# Patient Record
Sex: Male | Born: 1935 | Race: White | Hispanic: No | Marital: Married | Smoking: Former smoker
Health system: Southern US, Community
[De-identification: ages and names within clinical notes are randomized; demographics above are authoritative.]

## PROBLEM LIST (undated history)

## (undated) DIAGNOSIS — K5790 Diverticulosis of intestine, part unspecified, without perforation or abscess without bleeding: Secondary | ICD-10-CM

## (undated) DIAGNOSIS — K219 Gastro-esophageal reflux disease without esophagitis: Secondary | ICD-10-CM

## (undated) DIAGNOSIS — N400 Enlarged prostate without lower urinary tract symptoms: Secondary | ICD-10-CM

## (undated) DIAGNOSIS — E785 Hyperlipidemia, unspecified: Secondary | ICD-10-CM

## (undated) DIAGNOSIS — N183 Chronic kidney disease, stage 3 unspecified: Secondary | ICD-10-CM

## (undated) DIAGNOSIS — I10 Essential (primary) hypertension: Secondary | ICD-10-CM

## (undated) DIAGNOSIS — F32A Depression, unspecified: Secondary | ICD-10-CM

## (undated) DIAGNOSIS — F329 Major depressive disorder, single episode, unspecified: Secondary | ICD-10-CM

## (undated) DIAGNOSIS — M1990 Unspecified osteoarthritis, unspecified site: Secondary | ICD-10-CM

## (undated) HISTORY — DX: Hyperlipidemia, unspecified: E78.5

## (undated) HISTORY — PX: VEIN LIGATION AND STRIPPING: SHX2653

## (undated) HISTORY — DX: Diverticulosis of intestine, part unspecified, without perforation or abscess without bleeding: K57.90

## (undated) HISTORY — PX: FRACTURE SURGERY: SHX138

## (undated) HISTORY — DX: Benign prostatic hyperplasia without lower urinary tract symptoms: N40.0

## (undated) HISTORY — DX: Essential (primary) hypertension: I10

## (undated) HISTORY — PX: COLONOSCOPY: SHX174

## (undated) HISTORY — PX: KNEE ARTHROSCOPY: SUR90

## (undated) HISTORY — PX: TONSILLECTOMY AND ADENOIDECTOMY: SUR1326

---

## 1999-12-04 ENCOUNTER — Ambulatory Visit (HOSPITAL_COMMUNITY): Admission: RE | Admit: 1999-12-04 | Discharge: 1999-12-04 | Payer: Self-pay | Admitting: Internal Medicine

## 1999-12-04 ENCOUNTER — Encounter: Payer: Self-pay | Admitting: Internal Medicine

## 2000-11-01 ENCOUNTER — Inpatient Hospital Stay (HOSPITAL_COMMUNITY): Admission: EM | Admit: 2000-11-01 | Discharge: 2000-11-05 | Payer: Self-pay | Admitting: Emergency Medicine

## 2000-11-01 ENCOUNTER — Encounter: Payer: Self-pay | Admitting: Emergency Medicine

## 2004-03-08 ENCOUNTER — Ambulatory Visit (HOSPITAL_COMMUNITY): Admission: RE | Admit: 2004-03-08 | Discharge: 2004-03-08 | Payer: Self-pay | Admitting: Internal Medicine

## 2004-04-09 DIAGNOSIS — Z86010 Personal history of colonic polyps: Secondary | ICD-10-CM | POA: Insufficient documentation

## 2011-07-29 ENCOUNTER — Encounter: Payer: Self-pay | Admitting: Internal Medicine

## 2011-08-07 ENCOUNTER — Ambulatory Visit (AMBULATORY_SURGERY_CENTER): Payer: Medicare Other | Admitting: *Deleted

## 2011-08-07 VITALS — Ht 70.0 in | Wt 245.0 lb

## 2011-08-07 DIAGNOSIS — Z1211 Encounter for screening for malignant neoplasm of colon: Secondary | ICD-10-CM

## 2011-08-07 MED ORDER — PEG-KCL-NACL-NASULF-NA ASC-C 100 G PO SOLR: ORAL | Status: DC

## 2011-08-21 ENCOUNTER — Encounter: Payer: Self-pay | Admitting: Internal Medicine

## 2011-08-21 ENCOUNTER — Ambulatory Visit (AMBULATORY_SURGERY_CENTER): Payer: Medicare Other | Admitting: Internal Medicine

## 2011-08-21 VITALS — BP 146/66 | HR 61 | Temp 96.5°F | Resp 20 | Ht 70.0 in | Wt 245.0 lb

## 2011-08-21 DIAGNOSIS — D126 Benign neoplasm of colon, unspecified: Secondary | ICD-10-CM

## 2011-08-21 DIAGNOSIS — Z86010 Personal history of colonic polyps: Secondary | ICD-10-CM

## 2011-08-21 DIAGNOSIS — Z1211 Encounter for screening for malignant neoplasm of colon: Secondary | ICD-10-CM

## 2011-08-21 LAB — HM COLONOSCOPY

## 2011-08-21 MED ORDER — ASPIRIN 81 MG PO TABS: 81.0000 mg | ORAL_TABLET | Freq: Every day | ORAL | Status: DC

## 2011-08-21 MED ORDER — SODIUM CHLORIDE 0.9 % IV SOLN: 500.0000 mL | INTRAVENOUS | Status: DC

## 2011-08-21 NOTE — Progress Notes (Signed)
Small, reddened area below left side of lip noted.  Pt states he has had this area for a while and it is healing.

## 2011-08-21 NOTE — Progress Notes (Signed)
Patient did not experience any of the following events: a burn prior to discharge; a fall within the facility; wrong site/side/patient/procedure/implant event; or a hospital transfer or hospital admission upon discharge from the facility. (G8907) Patient did not have preoperative order for IV antibiotic SSI prophylaxis. (G8918)  

## 2011-08-21 NOTE — Patient Instructions (Addendum)
Four polyps were removed today. They look benign. I will send a letter about the results. It may make sense to have a repeat colonoscopy in the future but it will depend upon your health. I suspect I will recommend you at least think about it in 3-5 years.  You also have diverticulosis. That is not usually a problem. Please read the information provided.  Gatha Mayer, MD, [facility]   FOLLOW DISCHARGE INSTRUCTIONS ([place]).  Hold aspirin & aspirin products & any anti-inflammatory medications for 2 weeks.

## 2011-08-22 ENCOUNTER — Telehealth: Payer: Self-pay

## 2011-08-22 NOTE — Telephone Encounter (Signed)

## 2011-08-26 ENCOUNTER — Encounter: Payer: Self-pay | Admitting: Internal Medicine

## 2011-08-26 NOTE — Progress Notes (Signed)
Quick Note:  Recall colonoscopy 07/2014 to consider repeat colonoscopy ______

## 2013-07-27 ENCOUNTER — Encounter: Payer: Self-pay | Admitting: Emergency Medicine

## 2013-07-27 ENCOUNTER — Ambulatory Visit: Payer: Medicare Other | Admitting: Emergency Medicine

## 2013-07-27 VITALS — BP 122/72 | HR 72 | Temp 97.8°F | Resp 18 | Wt 234.0 lb

## 2013-07-27 DIAGNOSIS — I10 Essential (primary) hypertension: Secondary | ICD-10-CM

## 2013-07-27 DIAGNOSIS — E119 Type 2 diabetes mellitus without complications: Secondary | ICD-10-CM

## 2013-07-27 DIAGNOSIS — E785 Hyperlipidemia, unspecified: Secondary | ICD-10-CM

## 2013-07-27 DIAGNOSIS — J209 Acute bronchitis, unspecified: Secondary | ICD-10-CM

## 2013-07-27 DIAGNOSIS — J309 Allergic rhinitis, unspecified: Secondary | ICD-10-CM

## 2013-07-27 MED ORDER — ALBUTEROL SULFATE HFA 108 (90 BASE) MCG/ACT IN AERS: 2.0000 | INHALATION_SPRAY | Freq: Four times a day (QID) | RESPIRATORY_TRACT | Status: DC | PRN

## 2013-07-27 MED ORDER — BENZONATATE 100 MG PO CAPS: 100.0000 mg | ORAL_CAPSULE | Freq: Three times a day (TID) | ORAL | Status: DC | PRN

## 2013-07-27 MED ORDER — AZITHROMYCIN 250 MG PO TABS: ORAL_TABLET | ORAL | Status: AC

## 2013-07-27 NOTE — Patient Instructions (Signed)
Allergic Rhinitis Allergic rhinitis is when the mucous membranes in the nose respond to allergens. Allergens are particles in the air that cause your body to have an allergic reaction. This causes you to release allergic antibodies. Through a chain of events, these eventually cause you to release histamine into the blood stream (hence the use of antihistamines). Although meant to be protective to the body, it is this release that causes your discomfort, such as frequent sneezing, congestion and an itchy runny nose.  CAUSES  The pollen allergens may come from grasses, trees, and weeds. This is seasonal allergic rhinitis, or "hay fever." Other allergens cause year-round allergic rhinitis (perennial allergic rhinitis) such as house dust mite allergen, pet dander and mold spores.  SYMPTOMS   Nasal stuffiness (congestion).  Runny, itchy nose with sneezing and tearing of the eyes.  There is often an itching of the mouth, eyes and ears. It cannot be cured, but it can be controlled with medications. DIAGNOSIS  If you are unable to determine the offending allergen, skin or blood testing may find it. TREATMENT   Avoid the allergen.  Medications and allergy shots (immunotherapy) can help.  Hay fever may often be treated with antihistamines in pill or nasal spray forms. Antihistamines block the effects of histamine. There are over-the-counter medicines that may help with nasal congestion and swelling around the eyes. Check with your caregiver before taking or giving this medicine. If the treatment above does not work, there are many new medications your caregiver can prescribe. Stronger medications may be used if initial measures are ineffective. Desensitizing injections can be used if medications and avoidance fails. Desensitization is when a patient is given ongoing shots until the body becomes less sensitive to the allergen. Make sure you follow up with your caregiver if problems continue. SEEK MEDICAL  CARE IF:   You develop fever (more than 100.5 F (38.1 C).  You develop a cough that does not stop easily (persistent).  You have shortness of breath.  You start wheezing.  Symptoms interfere with normal daily activities. Document Released: 06/03/2001 Document Revised: 12/01/2011 Document Reviewed: 12/13/2008 [facility] Patient Information 2014 [place]. Bronchitis Bronchitis is a problem of the air tubes leading to your lungs. This problem makes it hard for air to get in and out of the lungs. You may cough a lot because your air tubes are narrow. Going without care can cause lasting (chronic) bronchitis. HOME CARE   Drink enough fluids to keep your pee (urine) clear or pale yellow.  Use a cool mist humidifier.  Quit smoking if you smoke. If you keep smoking, the bronchitis might not get better.  Only take medicine as told by your doctor. GET HELP RIGHT AWAY IF:   Coughing keeps you awake.  You start to wheeze.  You become more sick or weak.  You have a hard time breathing or get short of breath.  You cough up blood.  Coughing lasts more than 2 weeks.  You have a fever.  Your baby is older than 3 months with a rectal temperature of 102 F (38.9 C) or higher.  Your baby is 43 months old or younger with a rectal temperature of 100.4 F (38 C) or higher. MAKE SURE YOU:  Understand these instructions.  Will watch your condition.  Will get help right away if you are not doing well or get worse. Document Released: 02/25/2008 Document Revised: 12/01/2011 Document Reviewed: 05/03/2013 [facility] Patient Information 2014 [place], [place].

## 2013-07-28 ENCOUNTER — Encounter: Payer: Self-pay | Admitting: Emergency Medicine

## 2013-07-28 DIAGNOSIS — E1122 Type 2 diabetes mellitus with diabetic chronic kidney disease: Secondary | ICD-10-CM | POA: Insufficient documentation

## 2013-07-28 DIAGNOSIS — I10 Essential (primary) hypertension: Secondary | ICD-10-CM | POA: Insufficient documentation

## 2013-07-28 DIAGNOSIS — E1165 Type 2 diabetes mellitus with hyperglycemia: Secondary | ICD-10-CM | POA: Insufficient documentation

## 2013-07-28 NOTE — Progress Notes (Signed)
Subjective:    Patient ID: Edwin Osborne, male    DOB: [birth year], 78 y.o.   MRN: [id number]  HPI Comments: 78 yo male presents with fever, cough, fatigue, post nasal drainage x 2 days and now with thick production. Patient notes allergies with weather change but worse after racking leaves. No relief with OTC Tylenol. Denies any other SX.  Cough Associated symptoms include a fever and postnasal drip.  Sore Throat  Associated symptoms include congestion and coughing.     Current Outpatient Prescriptions on File Prior to Visit  Medication Sig Dispense Refill  . aspirin 81 MG tablet Take 1 tablet (81 mg total) by mouth daily. Stop and restart on 09/04/11      . doxazosin (CARDURA) 8 MG tablet Take 4 mg by mouth Daily.      . fenofibrate micronized (LOFIBRA) 134 MG capsule Take 1 mg by mouth Daily.      Marland Kitchen JANUVIA 100 MG tablet Take 50 mg by mouth Daily.      . metFORMIN (GLUCOPHAGE-XR) 500 MG 24 hr tablet Take 500 mg by mouth 3 (three) times daily with meals. Takes 1 in am and at lunch and 2 at hs      . Multiple Vitamins-Minerals (MULTIVITAMIN WITH MINERALS) tablet Take 1 tablet by mouth daily.        . Nutritional Supplements (VITAMIN D MAINTENANCE PO) Take 10,000 mg by mouth daily.        . pravastatin (PRAVACHOL) 40 MG tablet Take 20 mg by mouth daily.        . quinapril-hydrochlorothiazide (ACCURETIC) 20-25 MG per tablet Take 0.5 tablets by mouth daily.        Marland Kitchen Specialty Vitamins Products (MAGNESIUM, AMINO ACID CHELATE,) 133 MG tablet Take 1 tablet by mouth daily.         Current Facility-Administered Medications on File Prior to Visit  Medication Dose Route Frequency Provider Last Rate Last Dose  . 0.9 %  sodium chloride infusion  500 mL Intravenous Continuous Gatha Mayer, MD       Review of patient's allergies indicates no known allergies. Past Medical History  Diagnosis Date  . Hyperlipidemia   . Hypertension   . Diabetes mellitus   . BPH (benign prostatic hyperplasia)    . Cataract     Review of Systems  Constitutional: Positive for fever and fatigue.  HENT: Positive for congestion, postnasal drip and sinus pressure.   Eyes: Negative.   Respiratory: Positive for cough.   Cardiovascular: Negative.   Gastrointestinal: Negative.   Musculoskeletal: Negative.   Allergic/Immunologic: Negative.   Psychiatric/Behavioral: Negative.       BP 122/72  Pulse 72  Temp(Src) 97.8 F (36.6 C) (Temporal)  Resp 18  Wt 234 lb (106.142 kg)  Objective:   Physical Exam  Nursing note and vitals reviewed. Constitutional: He appears well-developed and well-nourished.  HENT:  Head: Normocephalic and atraumatic.  Right Ear: External ear normal.  Left Ear: External ear normal.  Nose: Nose normal.  Cloudy TM's bilaterally  Eyes: Pupils are equal, round, and reactive to light.  Neck: Normal range of motion. Neck supple.  Cardiovascular: Normal rate, regular rhythm and normal heart sounds.   Pulmonary/Chest: Effort normal.  Occasional Wheeze, clears with hard cough. + congested cough.  Abdominal: Soft.  Musculoskeletal: Normal range of motion.  Neurological: He is alert.  Skin: Skin is warm and dry.  Psychiatric: He has a normal mood and affect. Judgment normal.  Assessment & Plan:  Bronchitis, allergic rhinitis with history of pneumonia last year concern with possibility of early decompensation. Zpak #1 AD, Tessalon Perles 100 mg 1-2 PO TID/PRN, Allegra OTC AD, Albuterol HFA 1-2 Puffs Q4hr/PRN, increase H2o. PT will call if SX do not improve.

## 2013-08-08 ENCOUNTER — Other Ambulatory Visit: Payer: Self-pay | Admitting: Internal Medicine

## 2013-08-12 ENCOUNTER — Encounter: Payer: Self-pay | Admitting: Internal Medicine

## 2013-08-15 ENCOUNTER — Ambulatory Visit: Payer: Medicare Other | Admitting: Internal Medicine

## 2013-08-15 ENCOUNTER — Encounter: Payer: Self-pay | Admitting: Internal Medicine

## 2013-08-15 VITALS — BP 134/70 | HR 80 | Temp 98.4°F | Resp 18 | Ht 70.5 in | Wt 238.4 lb

## 2013-08-15 DIAGNOSIS — E782 Mixed hyperlipidemia: Secondary | ICD-10-CM

## 2013-08-15 DIAGNOSIS — E1169 Type 2 diabetes mellitus with other specified complication: Secondary | ICD-10-CM | POA: Insufficient documentation

## 2013-08-15 DIAGNOSIS — Z1212 Encounter for screening for malignant neoplasm of rectum: Secondary | ICD-10-CM

## 2013-08-15 DIAGNOSIS — Z0000 Encounter for general adult medical examination without abnormal findings: Secondary | ICD-10-CM

## 2013-08-15 DIAGNOSIS — E119 Type 2 diabetes mellitus without complications: Secondary | ICD-10-CM

## 2013-08-15 DIAGNOSIS — Z125 Encounter for screening for malignant neoplasm of prostate: Secondary | ICD-10-CM

## 2013-08-15 DIAGNOSIS — E559 Vitamin D deficiency, unspecified: Secondary | ICD-10-CM

## 2013-08-15 DIAGNOSIS — Z79899 Other long term (current) drug therapy: Secondary | ICD-10-CM

## 2013-08-15 DIAGNOSIS — I10 Essential (primary) hypertension: Secondary | ICD-10-CM

## 2013-08-15 LAB — BASIC METABOLIC PANEL WITH GFR
BUN: 23 mg/dL (ref 6–23)
CO2: 27 mEq/L (ref 19–32)
Calcium: 9.8 mg/dL (ref 8.4–10.5)
Chloride: 102 mEq/L (ref 96–112)
Creat: 1.58 mg/dL — ABNORMAL HIGH (ref 0.50–1.35)
GFR, Est African American: 48 mL/min — ABNORMAL LOW
GFR, Est Non African American: 42 mL/min — ABNORMAL LOW
Glucose, Bld: 155 mg/dL — ABNORMAL HIGH (ref 70–99)
Potassium: 4.5 mEq/L (ref 3.5–5.3)
Sodium: 139 mEq/L (ref 135–145)

## 2013-08-15 LAB — LIPID PANEL
Cholesterol: 139 mg/dL (ref 0–200)
HDL: 39 mg/dL — ABNORMAL LOW (ref >39)
LDL Cholesterol: 53 mg/dL (ref 0–99)
Total CHOL/HDL Ratio: 3.6 Ratio
Triglycerides: 236 mg/dL — ABNORMAL HIGH (ref <150)
VLDL: 47 mg/dL — ABNORMAL HIGH (ref 0–40)

## 2013-08-15 LAB — TSH: TSH: 2.381 u[IU]/mL (ref 0.350–4.500)

## 2013-08-15 LAB — MAGNESIUM: Magnesium: 1.9 mg/dL (ref 1.5–2.5)

## 2013-08-15 LAB — CBC WITH DIFFERENTIAL/PLATELET
Basophils Absolute: 0 10*3/uL (ref 0.0–0.1)
Basophils Relative: 0 % (ref 0–1)
Eosinophils Absolute: 0.6 10*3/uL (ref 0.0–0.7)
Eosinophils Relative: 9 % — ABNORMAL HIGH (ref 0–5)
HCT: 39.8 % (ref 39.0–52.0)
Hemoglobin: 13.8 g/dL (ref 13.0–17.0)
Lymphocytes Relative: 25 % (ref 12–46)
Lymphs Abs: 1.7 10*3/uL (ref 0.7–4.0)
MCH: 29.4 pg (ref 26.0–34.0)
MCHC: 34.7 g/dL (ref 30.0–36.0)
MCV: 84.7 fL (ref 78.0–100.0)
Monocytes Absolute: 0.6 10*3/uL (ref 0.1–1.0)
Monocytes Relative: 9 % (ref 3–12)
Neutro Abs: 3.8 10*3/uL (ref 1.7–7.7)
Neutrophils Relative %: 57 % (ref 43–77)
Platelets: 202 10*3/uL (ref 150–400)
RBC: 4.7 MIL/uL (ref 4.22–5.81)
RDW: 14.1 % (ref 11.5–15.5)
WBC: 6.7 10*3/uL (ref 4.0–10.5)

## 2013-08-15 LAB — HEMOGLOBIN A1C
Hgb A1c MFr Bld: 8 % — ABNORMAL HIGH (ref <5.7)
Mean Plasma Glucose: 183 mg/dL — ABNORMAL HIGH (ref <117)

## 2013-08-15 LAB — HEPATIC FUNCTION PANEL
ALT: 17 U/L (ref 0–53)
AST: 17 U/L (ref 0–37)
Albumin: 4.3 g/dL (ref 3.5–5.2)
Alkaline Phosphatase: 40 U/L (ref 39–117)
Bilirubin, Direct: 0.1 mg/dL (ref 0.0–0.3)
Indirect Bilirubin: 0.4 mg/dL (ref 0.0–0.9)
Total Bilirubin: 0.5 mg/dL (ref 0.3–1.2)
Total Protein: 6.6 g/dL (ref 6.0–8.3)

## 2013-08-15 NOTE — Patient Instructions (Signed)

## 2013-08-15 NOTE — Progress Notes (Signed)
Patient ID: Edwin Osborne, male   DOB: 1935-08-06, 78 y.o.   MRN: [id number]  Annual Screening Comprehensive Examination  This very nice 78 yo MWM presents for complete physical.  Patient has been followed for HTN, Diabetes, Hyperlipidemia, and vitamin D Deficiency.   Patient's BP has been controlled at home. Patient denies any cardiac Symptoms as chest pain, palpitations, shortness of breath, dizziness or ankle swelling.   Patient's hyperlipidemia is controlled with diet and medications. Patient denies myalgias or other medication SE's. Last cholesterol last visit was 148  ,HDL 37 were at goal but he had a very elevated LDL 136 and Triglycerides 376 due to his admitted overeating and poor compliance.   Patient has Diabetes with last A1c 10.5% in August. Patient self d/c'd his Januvia because of it's cost. . Patient denies reactive hypoglycemic symptoms, visual blurring, diabetic polys, or paresthesias.He is aware is is overdue with his f/u with Dr Einar Gip and was advised to follow-up.   Finally, patient has history of Vitamin D Deficiency with last vitamin D 63 in August.     Current Outpatient Prescriptions on File Prior to Visit  Medication Sig Dispense Refill  . albuterol (PROVENTIL HFA;VENTOLIN HFA) 108 (90 BASE) MCG/ACT inhaler Inhale 2 puffs into the lungs every 6 (six) hours as needed for wheezing or shortness of breath.  1 Inhaler  2  . aspirin 81 MG tablet Take 1 tablet (81 mg total) by mouth daily. Stop and restart on 09/04/11      . benzonatate (TESSALON PERLES) 100 MG capsule Take 1 capsule (100 mg total) by mouth 3 (three) times daily as needed for cough.  30 capsule  1  . doxazosin (CARDURA) 8 MG tablet Take 4 mg by mouth Daily.      . fenofibrate micronized (LOFIBRA) 134 MG capsule Take 1 mg by mouth Daily.      Marland Kitchen JANUVIA 100 MG tablet Take 50 mg by mouth Daily.      . metFORMIN (GLUCOPHAGE-XR) 500 MG 24 hr tablet Take 500 mg by mouth 3 (three) times daily with meals.  Takes 1 in am and at lunch and 2 at hs      . Multiple Vitamins-Minerals (MULTIVITAMIN WITH MINERALS) tablet Take 1 tablet by mouth daily.        . Nutritional Supplements (VITAMIN D MAINTENANCE PO) Take 10,000 mg by mouth daily.        . pravastatin (PRAVACHOL) 40 MG tablet Take 20 mg by mouth daily.        . quinapril-hydrochlorothiazide (ACCURETIC) 20-25 MG per tablet TAKE 1 TABLET BY MOUTH EVERY DAY  90 tablet  0  . Specialty Vitamins Products (MAGNESIUM, AMINO ACID CHELATE,) 133 MG tablet Take 1 tablet by mouth daily.         No Known Allergies  Past Medical History  Diagnosis Date  . Hyperlipidemia   . Hypertension   . Diabetes mellitus   . BPH (benign prostatic hyperplasia)   . Cataract     Past Surgical History  Procedure Laterality Date  . Tonsillectomy and adenoidectomy    . Vein ligation and stripping      left  . Knee arthroscopy    . 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 Marital Status: Married    Spouse Name: N/A    Number of Children: 2 - son & daughter  .  Years of Education: N/A   Occupational History  . Retired Building control surveyor / Occupational psychologist   Social History Main Topics  . Smoking status: Former Smoker    Types: Cigars    Quit date: 09/23/1987  . Smokeless tobacco: Never Used  . Alcohol Use: Yes     Comment: rarely  . Drug Use: No  . Sexual Activity: Not on file   Other Topics Concern  . Not on file   Social History Narrative  . No narrative on file    ROS Constitutional: Denies fever, chills, weight loss/gain, headaches, insomnia, fatigue, night sweats, and change in appetite. Eyes: Denies redness, blurred vision, diplopia, discharge, itchy, watery eyes.  ENT: Denies discharge, congestion, post nasal drip, epistaxis, sore throat, earache, hearing loss, dental pain, Tinnitus, Vertigo, Sinus pain, snoring.  Cardio: Denies chest pain,  palpitations, irregular heartbeat, syncope, dyspnea, diaphoresis, orthopnea, PND, claudication, edema Respiratory: denies cough, dyspnea, DOE, pleurisy, hoarseness, laryngitis, wheezing.  Gastrointestinal: Denies dysphagia, heartburn, reflux, water brash, pain, cramps, nausea, vomiting, bloating, diarrhea, constipation, hematemesis, melena, hematochezia, jaundice, hemorrhoids Genitourinary: Denies dysuria, frequency, urgency, nocturia, hesitancy, discharge, hematuria, flank pain Musculoskeletal: Denies arthralgia, myalgia, stiffness, Jt. Swelling, pain, limp, and strain/sprain. Skin: Denies puritis, rash, hives, warts, acne, eczema, changing in skin lesion Neuro: Weakness, tremor, incoordination, spasms, paresthesia, pain Psychiatric: Denies confusion, memory loss, sensory loss Endocrine: Denies change in weight, skin, hair change, nocturia, and paresthesia, diabetic polys, visual blurring, hyper /hypo glycemic episodes.  Heme/Lymph: No excessive bleeding, bruising, enlarged lymph nodes.  Filed Vitals:   08/15/13 1013  BP: 134/70  Pulse: 80  Temp: 98.4 F (36.9 C)  Resp: 18   Estimated body mass index is 33.71 kg/(m^2) as calculated from the following:   Height as of this encounter: 5' 10.5" (1.791 m).   Weight as of this encounter: 238 lb 6.4 oz (108.138 kg).  Physical Exam General Appearance: Well nourished, in no apparent distress. Eyes: PERRLA, EOMs, conjunctiva no swelling or erythema, normal fundi and vessels. Sinuses: No frontal/maxillary tenderness ENT/Mouth: EACs patent / TMs  nl. Nares clear without erythema, swelling, mucoid exudates. Oral hygiene is good. No erythema, swelling, or exudate. Tongue normal, non-obstructing. Tonsils not swollen or erythematous. Hearing normal.  Neck: Supple, thyroid normal. No bruits, nodes or JVD. Respiratory: Respiratory effort normal.  BS equal and clear bilateral without rales, rhonci, wheezing or stridor. Cardio: Heart sounds are normal  with regular rate and rhythm and no murmurs, rubs or gallops. Peripheral pulses are normal and equal bilaterally without edema. No aortic or femoral bruits. Chest: symmetric with normal excursions and percussion.  Abdomen: Flat, soft, with bowl sounds. Nontender, no guarding, rebound, hernias, masses, or organomegaly.  Lymphatics: Non tender without lymphadenopathy.  Genitourinary: No hernias.Testes nl. DRE - prostate nl for age - smooth & firm w/o nodules. Musculoskeletal: Full ROM all peripheral extremities, joint stability, 5/5 strength, and normal gait. Skin: Warm and dry without rashes, lesions, cyanosis, clubbing or  ecchymosis.  Neuro: Cranial nerves intact, reflexes equal bilaterally. Normal muscle tone, no cerebellar symptoms. Sensation intact.  Pysch: Awake and oriented X 3, normal affect, Insight and Judgment appropriate.   Assessment and Plan  1. Annual Screening Examination 2. Hypertension  3. Hyperlipidemia 4. NIDDM 5. Vitamin D Deficiency 6. Obesity (BMI = 33.71)  Continue prudent diet as discussed, weight control, regular exercise, and medications. Routine screening labs and tests as requested with regular follow-up as recommended. Long Discussion re: importance of better dietary compliance and weight loss & patient seemed somewhat  indiffersent to change his eating habits.

## 2013-08-16 LAB — MICROALBUMIN / CREATININE URINE RATIO
Creatinine, Urine: 140.5 mg/dL
Microalb Creat Ratio: 8.5 mg/g (ref 0.0–30.0)
Microalb, Ur: 1.2 mg/dL (ref 0.00–1.89)

## 2013-08-16 LAB — URINALYSIS, MICROSCOPIC ONLY
Bacteria, UA: NONE SEEN
Casts: NONE SEEN
Crystals: NONE SEEN
Squamous Epithelial / LPF: NONE SEEN

## 2013-08-16 LAB — INSULIN, FASTING: Insulin fasting, serum: 24 u[IU]/mL (ref 3–28)

## 2013-08-16 LAB — PSA: PSA: 0.98 ng/mL (ref <=4.00)

## 2013-08-16 LAB — VITAMIN D 25 HYDROXY (VIT D DEFICIENCY, FRACTURES): Vit D, 25-Hydroxy: 58 ng/mL (ref 30–89)

## 2013-08-22 ENCOUNTER — Other Ambulatory Visit: Payer: Self-pay | Admitting: Internal Medicine

## 2013-11-24 ENCOUNTER — Ambulatory Visit: Payer: Self-pay | Admitting: Physician Assistant

## 2013-12-01 ENCOUNTER — Ambulatory Visit (INDEPENDENT_AMBULATORY_CARE_PROVIDER_SITE_OTHER): Payer: Medicare Other | Admitting: Physician Assistant

## 2013-12-01 ENCOUNTER — Encounter: Payer: Self-pay | Admitting: Physician Assistant

## 2013-12-01 VITALS — BP 138/80 | HR 68 | Temp 97.2°F | Resp 16 | Ht 69.5 in | Wt 239.0 lb

## 2013-12-01 DIAGNOSIS — E782 Mixed hyperlipidemia: Secondary | ICD-10-CM

## 2013-12-01 DIAGNOSIS — E119 Type 2 diabetes mellitus without complications: Secondary | ICD-10-CM

## 2013-12-01 DIAGNOSIS — I10 Essential (primary) hypertension: Secondary | ICD-10-CM

## 2013-12-01 DIAGNOSIS — Z79899 Other long term (current) drug therapy: Secondary | ICD-10-CM

## 2013-12-01 DIAGNOSIS — E559 Vitamin D deficiency, unspecified: Secondary | ICD-10-CM

## 2013-12-01 LAB — BASIC METABOLIC PANEL WITH GFR
BUN: 23 mg/dL (ref 6–23)
CO2: 25 mEq/L (ref 19–32)
Calcium: 9.6 mg/dL (ref 8.4–10.5)
Chloride: 101 mEq/L (ref 96–112)
Creat: 1.3 mg/dL (ref 0.50–1.35)
GFR, Est African American: 61 mL/min
GFR, Est Non African American: 53 mL/min — ABNORMAL LOW
Glucose, Bld: 225 mg/dL — ABNORMAL HIGH (ref 70–99)
Potassium: 4.4 mEq/L (ref 3.5–5.3)
Sodium: 137 mEq/L (ref 135–145)

## 2013-12-01 LAB — LIPID PANEL
Cholesterol: 147 mg/dL (ref 0–200)
HDL: 38 mg/dL — ABNORMAL LOW (ref >39)
LDL Cholesterol: 47 mg/dL (ref 0–99)
Total CHOL/HDL Ratio: 3.9 Ratio
Triglycerides: 312 mg/dL — ABNORMAL HIGH (ref <150)
VLDL: 62 mg/dL — ABNORMAL HIGH (ref 0–40)

## 2013-12-01 LAB — HEPATIC FUNCTION PANEL
ALT: 18 U/L (ref 0–53)
AST: 22 U/L (ref 0–37)
Albumin: 4.4 g/dL (ref 3.5–5.2)
Alkaline Phosphatase: 50 U/L (ref 39–117)
Bilirubin, Direct: 0.1 mg/dL (ref 0.0–0.3)
Indirect Bilirubin: 0.4 mg/dL (ref 0.2–1.2)
Total Bilirubin: 0.5 mg/dL (ref 0.2–1.2)
Total Protein: 6.7 g/dL (ref 6.0–8.3)

## 2013-12-01 LAB — CBC WITH DIFFERENTIAL/PLATELET
Basophils Absolute: 0.1 10*3/uL (ref 0.0–0.1)
Basophils Relative: 1 % (ref 0–1)
Eosinophils Absolute: 0.2 10*3/uL (ref 0.0–0.7)
Eosinophils Relative: 4 % (ref 0–5)
HCT: 41.8 % (ref 39.0–52.0)
Hemoglobin: 14.1 g/dL (ref 13.0–17.0)
Lymphocytes Relative: 27 % (ref 12–46)
Lymphs Abs: 1.7 10*3/uL (ref 0.7–4.0)
MCH: 29 pg (ref 26.0–34.0)
MCHC: 33.7 g/dL (ref 30.0–36.0)
MCV: 86 fL (ref 78.0–100.0)
Monocytes Absolute: 0.4 10*3/uL (ref 0.1–1.0)
Monocytes Relative: 7 % (ref 3–12)
Neutro Abs: 3.8 10*3/uL (ref 1.7–7.7)
Neutrophils Relative %: 61 % (ref 43–77)
Platelets: 199 10*3/uL (ref 150–400)
RBC: 4.86 MIL/uL (ref 4.22–5.81)
RDW: 13.7 % (ref 11.5–15.5)
WBC: 6.2 10*3/uL (ref 4.0–10.5)

## 2013-12-01 LAB — MAGNESIUM: Magnesium: 1.6 mg/dL (ref 1.5–2.5)

## 2013-12-01 LAB — HEMOGLOBIN A1C
Hgb A1c MFr Bld: 10.4 % — ABNORMAL HIGH (ref <5.7)
Mean Plasma Glucose: 252 mg/dL — ABNORMAL HIGH (ref <117)

## 2013-12-01 LAB — TSH: TSH: 2.26 u[IU]/mL (ref 0.350–4.500)

## 2013-12-01 NOTE — Progress Notes (Signed)
HPI 79 y.o. male  presents for 3 month follow up with hypertension, hyperlipidemia, diabetes and vitamin D. His blood pressure has been controlled at home, today their BP is BP: 138/80 mmHg He does not workout. He denies chest pain, shortness of breath, dizziness.  He is on cholesterol medication and denies myalgias. His cholesterol is at goal. The cholesterol last visit was:   Lab Results  Component Value Date   CHOL 139 08/15/2013   HDL 39* 08/15/2013   LDLCALC 53 08/15/2013   TRIG 236* 08/15/2013   CHOLHDL 3.6 08/15/2013   He has not been working on diet and exercise for Diabetes, and denies paresthesia of the feet, polydipsia, polyuria and visual disturbances. He does not check his sugar. Last A1C in the office was:  Lab Results  Component Value Date   HGBA1C 8.0* 08/15/2013   Patient is on Vitamin D supplement.    Current Medications:  Current Outpatient Prescriptions on File Prior to Visit  Medication Sig Dispense Refill  . albuterol (PROVENTIL HFA;VENTOLIN HFA) 108 (90 BASE) MCG/ACT inhaler Inhale 2 puffs into the lungs every 6 (six) hours as needed for wheezing or shortness of breath.  1 Inhaler  2  . aspirin 81 MG tablet Take 1 tablet (81 mg total) by mouth daily. Stop and restart on 09/04/11      . benzonatate (TESSALON PERLES) 100 MG capsule Take 1 capsule (100 mg total) by mouth 3 (three) times daily as needed for cough.  30 capsule  1  . Cinnamon 500 MG TABS Take 1,000 mg by mouth.      . doxazosin (CARDURA) 8 MG tablet Take 4 mg by mouth Daily.      . fenofibrate micronized (LOFIBRA) 134 MG capsule Take 1 mg by mouth Daily.      . Flaxseed, Linseed, (FLAX SEED OIL) 1000 MG CAPS Take by mouth.      Marland Kitchen JANUVIA 100 MG tablet Take 50 mg by mouth Daily.      . metFORMIN (GLUCOPHAGE-XR) 500 MG 24 hr tablet TAKE 2 TABLETS BY MOUTH TWICE A DAY  360 tablet  2  . Multiple Vitamins-Minerals (MULTIVITAMIN WITH MINERALS) tablet Take 1 tablet by mouth daily.        . Nutritional  Supplements (VITAMIN D MAINTENANCE PO) Take 10,000 mg by mouth daily.        . Omega-3 Fatty Acids (FISH OIL) 1000 MG CAPS Take by mouth.      . pravastatin (PRAVACHOL) 40 MG tablet Take 20 mg by mouth daily.        . quinapril-hydrochlorothiazide (ACCURETIC) 20-25 MG per tablet TAKE 1 TABLET BY MOUTH EVERY DAY  90 tablet  0  . Specialty Vitamins Products (MAGNESIUM, AMINO ACID CHELATE,) 133 MG tablet Take 1 tablet by mouth daily.         Current Facility-Administered Medications on File Prior to Visit  Medication Dose Route Frequency Provider Last Rate Last Dose  . 0.9 %  sodium chloride infusion  500 mL Intravenous Continuous Gatha Mayer, MD       Medical History:  Past Medical History  Diagnosis Date  . Hyperlipidemia   . Hypertension   . Diabetes mellitus   . BPH (benign prostatic hyperplasia)   . Cataract    Allergies: No Known Allergies   Review of Systems: [X]  = complains of  [ ]  = denies  General: Fatigue [ ]  Fever [ ]  Chills [ ]  Weakness [ ]   Insomnia [ ]  Eyes:  Redness [ ]  Blurred vision [ ]  Diplopia [ ]   ENT: Congestion [ ]  Sinus Pain [ ]  Post Nasal Drip [ ]  Sore Throat [ ]  Earache [ ]   Cardiac: Chest pain/pressure [ ]  SOB [ ]  Orthopnea [ ]   Palpitations [ ]   Paroxysmal nocturnal dyspnea[ ]  Claudication [ ]  Edema [ ]   Pulmonary: Cough [ ]  Wheezing[ ]   SOB [ ]   Snoring [ ]   GI: Nausea [ ]  Vomiting[ ]  Dysphagia[ ]  Heartburn[ ]  Abdominal pain [ ]  Constipation [ ] ; Diarrhea [ ] ; BRBPR [ ]  Melena[ ]  GU: Hematuria[ ]  Dysuria [ ]  Nocturia[ ]  Urgency [ ]   Hesitancy [ ]  Discharge [ ]  Neuro: Headaches[ ]  Vertigo[ ]  Paresthesias[ ]  Spasm [ ]  Speech changes [ ]  Incoordination [ ]   Ortho: Arthritis [ ]  Joint pain [ ]  Muscle pain [ ]  Joint swelling [ ]  Back Pain [ ]  Skin:  Rash [ ]   Pruritis [ ]  Change in skin lesion [ ]   Psych: Depression[ ]  Anxiety[ ]  Confusion [ ]  Memory loss [ ]   Heme/Lypmh: Bleeding [ ]  Bruising [ ]  Enlarged lymph nodes [ ]   Endocrine: Visual blurring [ ]   Paresthesia [ ]  Polyuria [ ]  Polydypsea [ ]    Heat/cold intolerance [ ]  Hypoglycemia [ ]   Family history- Review and unchanged Social history- Review and unchanged Physical Exam: BP 138/80  Pulse 68  Temp(Src) 97.2 F (36.2 C)  Resp 16  Ht 5' 9.5" (1.765 m)  Wt 239 lb (108.41 kg)  BMI 34.80 kg/m2 Wt Readings from Last 3 Encounters:  12/01/13 239 lb (108.41 kg)  08/15/13 238 lb 6.4 oz (108.138 kg)  07/27/13 234 lb (106.142 kg)   General Appearance: Well nourished, in no apparent distress. Eyes: PERRLA, EOMs, conjunctiva no swelling or erythema Sinuses: No Frontal/maxillary tenderness ENT/Mouth: Ext aud canals clear, TMs without erythema, bulging. No erythema, swelling, or exudate on post pharynx.  Tonsils not swollen or erythematous. Hearing normal.  Neck: Supple, thyroid normal.  Respiratory: Respiratory effort normal, BS equal bilaterally without rales, rhonchi, wheezing or stridor.  Cardio: RRR with no MRGs. Brisk peripheral pulses without edema.  Abdomen: Soft, + BS. Obese,  Non tender, no guarding, rebound, hernias, masses. Lymphatics: Non tender without lymphadenopathy.  Musculoskeletal: Full ROM, 5/5 strength, normal gait.  Skin: Warm, dry without rashes, lesions, ecchymosis.  Neuro: Cranial nerves intact. No cerebellar symptoms. Sensation intact.  Psych: Awake and oriented X 3, normal affect, Insight and Judgment appropriate.   Assessment and Plan:  Hypertension: Continue medication, monitor blood pressure at home. Continue DASH diet. Cholesterol: Continue diet and exercise. Check cholesterol.  Diabetes-Continue diet and exercise. Check A1C Vitamin D Def- check level and continue medications.   Continue diet and meds as discussed. Further disposition pending results of labs. Discussed med's effects and SE's.    Vicie Mutters 10:47 AM

## 2013-12-01 NOTE — Patient Instructions (Signed)
   Bad carbs also include fruit juice, alcohol, and sweet tea. These are empty calories that do not signal to your brain that you are full.   Please remember the good carbs are still carbs which convert into sugar. So please measure them out no more than 1/2-1 cup of rice, oatmeal, pasta, and beans.  Veggies are however free foods! Pile them on.   I like lean protein at every meal such as chicken, [place], pork chops, cottage cheese, etc. Just do not fry these meats and please center your meal around vegetable, the meats should be a side dish.   No all fruit is created equal. Please see the list below, the fruit at the bottom is higher in sugars than the fruit at the top   We want weight loss that will last so you should lose 1-2 pounds a week.  THAT IS IT! Please pick THREE things a month to change. Once it is a habit check off the item. Then pick another three items off the list to become habits.  If you are already doing a habit on the list GREAT!  Cross that item off! o Don't drink your calories. Ie, alcohol, soda, fruit juice, and sweet tea.  o Drink more water. Drink a glass when you feel hungry or before each meal.  o Eat breakfast - Complex carb and protein (like[place]annon light and fit yogurt, oatmeal, fruit, eggs, [place] bacon). o Measure your cereal.  Eat no more than one cup a day. (ie [place]) o Eat an apple a day. o Add a vegetable a day. o Try a new vegetable a month. o Use Pam! Stop using oil or butter to cook. o Don't finish your plate or use smaller plates. o Share your dessert. o Eat sugar free Jello for dessert or frozen grapes. o Don't eat 2-3 hours before bed. o Switch to whole wheat bread, pasta, and brown rice. o Make healthier choices when you eat out. No fries! o Pick baked chicken, NOT fried. o Don't forget to SLOW DOWN when you eat. It is not going anywhere.  o Take the stairs. o Park far away in the parking lot o [employer] (or weights) for 10 minutes while  watching TV. o Walk at work for 10 minutes during break. o Walk outside 1 time a week with your friend, kids, dog, or significant other. o Start a walking group at [place] the mall as much as you can tolerate.  o Keep a food diary. o Weigh yourself daily. o Walk for 15 minutes 3 days per week. o Cook at home more often and eat out less.  If life happens and you go back to old habits, it is okay.  Just start over. You can do it!   If you experience chest pain, get short of breath, or tired during the exercise, please stop immediately and inform your doctor.

## 2013-12-02 ENCOUNTER — Telehealth: Payer: Self-pay

## 2013-12-02 LAB — VITAMIN D 25 HYDROXY (VIT D DEFICIENCY, FRACTURES): Vit D, 25-Hydroxy: 63 ng/mL (ref 30–89)

## 2013-12-02 MED ORDER — GLIPIZIDE 5 MG PO TABS: 5.0000 mg | ORAL_TABLET | Freq: Two times a day (BID) | ORAL | Status: DC

## 2013-12-02 NOTE — Addendum Note (Signed)
Addended by: Vicie Mutters R on: 12/02/2013 08:11 AM   Modules accepted: Orders

## 2013-12-02 NOTE — Telephone Encounter (Signed)
Message copied by Nadyne Coombes on Fri Dec 02, 2013 12:37 PM ------      Message from: Vicie Mutters R      Created: Fri Dec 02, 2013  8:10 AM       All of your labs are normal except:      Triglycerides are simple fats in blood that are converted into a storage form. I recommend you avoid fried/greasy foods, sweets/candy, white rice , white potatoes,  anything made from white flour, sweet tea, soda, fruit juices and avoid alcohol in excess. Sweet potatoes, brown/wild rice/Quinoa, Vegetarian, spinach, or wheat pasta, Multi-grain bread - like multi-grain flat bread or sandwich thins are okay.      Your HDL is low. The goal is greater than 90 for men and greater than 33 for women.  H-density lipoprotein cholesterol is considered to be beneficial because it removes excess cholesterol and disposes of it. Hence HDL cholesterol is often termed "good" cholesterol. Ways to increase your HDL is to add more veggies and walk more. The American Heart Association says that 150 mins a week of walking or cardio is good for your heart and will improve your HDL.      LDL at goal.       Your A1C is in the diabetic range. Your A1C is a measure of your sugar over the past 3 months and is not affected by what you have eaten over the past few days. Diabetes increases your chances of stroke and heart attack over 300 % and is the leading cause of blindness and kidney failure in the [place]. Please make sure you decrease bad carbs like white bread, white rice, potatoes, corn, soft drinks, pasta, cereals, refined sugars, sweet tea, dried fruits, and fruit juice. Good carbs are okay to eat in moderation like sweet potatoes, brown rice, whole grain pasta/bread, most fruit (expect dried fruit) and you can eat as many veggies as you want.             This is worse than before. It was at 8.0 and now at 10.4. Need very very strict diet and we are going to add on glipizide 5 mg twice daily with food. We will start with just  one daily with largest meal and you need to follow up in one month with record of sugars and for DM teaching. ------

## 2013-12-02 NOTE — Telephone Encounter (Signed)
lmom to pt about lab results.

## 2014-01-09 ENCOUNTER — Encounter: Payer: Self-pay | Admitting: Physician Assistant

## 2014-01-09 ENCOUNTER — Ambulatory Visit (HOSPITAL_COMMUNITY)
Admission: RE | Admit: 2014-01-09 | Discharge: 2014-01-09 | Disposition: A | Discharge status: Home / Self Care | Payer: Medicare Other | Source: Ambulatory Visit | Attending: Physician Assistant | Admitting: Physician Assistant

## 2014-01-09 ENCOUNTER — Ambulatory Visit (INDEPENDENT_AMBULATORY_CARE_PROVIDER_SITE_OTHER): Payer: Medicare Other | Admitting: Physician Assistant

## 2014-01-09 VITALS — BP 110/62 | HR 76 | Temp 98.6°F | Resp 16 | Wt 245.0 lb

## 2014-01-09 DIAGNOSIS — R059 Cough, unspecified: Secondary | ICD-10-CM

## 2014-01-09 DIAGNOSIS — R062 Wheezing: Secondary | ICD-10-CM | POA: Insufficient documentation

## 2014-01-09 DIAGNOSIS — R079 Chest pain, unspecified: Secondary | ICD-10-CM | POA: Insufficient documentation

## 2014-01-09 DIAGNOSIS — J984 Other disorders of lung: Secondary | ICD-10-CM | POA: Insufficient documentation

## 2014-01-09 DIAGNOSIS — R05 Cough: Secondary | ICD-10-CM

## 2014-01-09 DIAGNOSIS — J329 Chronic sinusitis, unspecified: Secondary | ICD-10-CM

## 2014-01-09 DIAGNOSIS — Z0000 Encounter for general adult medical examination without abnormal findings: Secondary | ICD-10-CM

## 2014-01-09 MED ORDER — ALBUTEROL SULFATE HFA 108 (90 BASE) MCG/ACT IN AERS: 1.0000 | INHALATION_SPRAY | Freq: Four times a day (QID) | RESPIRATORY_TRACT | Status: DC | PRN

## 2014-01-09 MED ORDER — PROMETHAZINE-CODEINE 6.25-10 MG/5ML PO SYRP
5.0000 mL | ORAL_SOLUTION | Freq: Four times a day (QID) | ORAL | Status: DC | PRN
Start: 2014-01-09 — End: 2014-03-07

## 2014-01-09 MED ORDER — AZITHROMYCIN 250 MG PO TABS: ORAL_TABLET | ORAL | Status: DC

## 2014-01-09 NOTE — Patient Instructions (Addendum)
His sugar was VERY VERY VERY high last time and he is in danger of having a heart attack, stroke or getting on Dialysis for kidney failure. Please decrease the following things:     Bad carbs also include fruit juice, alcohol, and sweet tea. These are empty calories that do not signal to your brain that you are full.   Please remember the good carbs are still carbs which convert into sugar. So please measure them out no more than 1/2-1 cup of rice, oatmeal, pasta, and beans.  Veggies are however free foods! Pile them on.   I like lean protein at every meal such as chicken, [place], pork chops, cottage cheese, etc. Just do not fry these meats and please center your meal around vegetable, the meats should be a side dish.   No all fruit is created equal. Please see the list below, the fruit at the bottom is higher in sugars than the fruit at the top   Preventative Care for Adults, Male       [place]:  A routine yearly physical is a good way to check in with your primary care provider about your health and preventive screening. It is also an opportunity to share updates about your health and any concerns you have, and receive a thorough all-over exam.   Most health insurance companies pay for at least some preventative services.  Check with your health plan for specific coverages.  WHAT PREVENTATIVE SERVICES DO MEN NEED?  Adult men should have their weight and blood pressure checked regularly.   Men age 9 and older should have their cholesterol levels checked regularly.  Beginning at age 9 and continuing to age 63, men should be screened for colorectal cancer.  Certain people should may need continued testing until age 74.  Other cancer screening may include exams for testicular and prostate cancer.  Updating vaccinations is part of preventative care.  Vaccinations help protect against diseases such as the flu.  Lab tests are generally done as part of preventative care to  screen for anemia and blood disorders, to screen for problems with the kidneys and liver, to screen for bladder problems, to check blood sugar, and to check your cholesterol level.  Preventative services generally include counseling about diet, exercise, avoiding tobacco, drugs, excessive alcohol consumption, and sexually transmitted infections.    GENERAL RECOMMENDATIONS FOR GOOD HEALTH:  Healthy diet:  Eat a variety of foods, including fruit, vegetables, animal or vegetable protein, such as meat, fish, chicken, and eggs, or beans, lentils, tofu, and grains, such as rice.  Drink plenty of water daily.  Decrease saturated fat in the diet, avoid lots of red meat, processed foods, sweets, fast foods, and fried foods.  Exercise:  Aerobic exercise helps maintain good heart health. At least 30-40 minutes of moderate-intensity exercise is recommended. For example, a brisk walk that increases your heart rate and breathing. This should be done on most days of the week.   Find a type of exercise or a variety of exercises that you enjoy so that it becomes a part of your daily life.  Examples are running, walking, swimming, water aerobics, and biking.  For motivation and support, explore group exercise such as aerobic class, spin class, Zumba, Yoga,or  martial arts, etc.    Set exercise goals for yourself, such as a certain weight goal, walk or run in a race such as a 5k walk/run.  Speak to your primary care provider about exercise goals.  Disease prevention:  If you smoke or chew tobacco, find out from your caregiver how to quit. It can literally save your life, no matter how long you have been a tobacco user. If you do not use tobacco, never begin.   Maintain a healthy diet and normal weight. Increased weight leads to problems with blood pressure and diabetes.   The Body Mass Index or BMI is a way of measuring how much of your body is fat. Having a BMI above 27 increases the risk of heart disease,  diabetes, hypertension, stroke and other problems related to obesity. Your caregiver can help determine your BMI and based on it develop an exercise and dietary program to help you achieve or maintain this important measurement at a healthful level.  High blood pressure causes heart and blood vessel problems.  Persistent high blood pressure should be treated with medicine if weight loss and exercise do not work.   Fat and cholesterol leaves deposits in your arteries that can block them. This causes heart disease and vessel disease elsewhere in your body.  If your cholesterol is found to be high, or if you have heart disease or certain other medical conditions, then you may need to have your cholesterol monitored frequently and be treated with medication.   Ask if you should have a stress test if your history suggests this. A stress test is a test done on a treadmill that looks for heart disease. This test can find disease prior to there being a problem.  Avoid drinking alcohol in excess (more than two drinks per day).  Avoid use of street drugs. Do not share needles with anyone. Ask for professional help if you need assistance or instructions on stopping the use of alcohol, cigarettes, and/or drugs.  Brush your teeth twice a day with fluoride toothpaste, and floss once a day. Good oral hygiene prevents tooth decay and gum disease. The problems can be painful, unattractive, and can cause other health problems. Visit your dentist for a routine oral and dental check up and preventive care every 6-12 months.   Look at your skin regularly.  Use a mirror to look at your back. Notify your caregivers of changes in moles, especially if there are changes in shapes, colors, a size larger than a pencil eraser, an irregular border, or development of new moles.  Safety:  Use seatbelts 100% of the time, whether driving or as a passenger.  Use safety devices such as hearing protection if you work in environments  with loud noise or significant background noise.  Use safety glasses when doing any work that could send debris in to the eyes.  Use a helmet if you ride a bike or motorcycle.  Use appropriate safety gear for contact sports.  Talk to your caregiver about gun safety.  Use sunscreen with a SPF (or skin protection factor) of 15 or greater.  Lighter skinned people are at a greater risk of skin cancer. Don't forget to also wear sunglasses in order to protect your eyes from too much damaging sunlight. Damaging sunlight can accelerate cataract formation.   Practice safe sex. Use condoms. Condoms are used for birth control and to help reduce the spread of sexually transmitted infections (or STIs).  Some of the STIs are gonorrhea (the clap), chlamydia, syphilis, trichomonas, herpes, HPV (human papilloma virus) and HIV (human immunodeficiency virus) which causes AIDS. The herpes, HIV and HPV are viral illnesses that have no cure. These can result in disability, cancer and death.  Keep carbon monoxide and smoke detectors in your home functioning at all times. Change the batteries every 6 months or use a model that plugs into the wall.   Vaccinations:  Stay up to date with your tetanus shots and other required immunizations. You should have a booster for tetanus every 10 years. Be sure to get your flu shot every year, since 5%-20% of the [place] population comes down with the flu. The flu vaccine changes each year, so being vaccinated once is not enough. Get your shot in the fall, before the flu season peaks.   Other vaccines to consider:  Pneumococcal vaccine to protect against certain types of pneumonia.  This is normally recommended for adults age 44 or older.  However, adults younger than 76 years old with certain underlying conditions such as diabetes, heart or lung disease should also receive the vaccine.  Shingles vaccine to protect against Varicella Zoster if you are older than age 35, or younger than 77  years old with certain underlying illness.  Hepatitis A vaccine to protect against a form of infection of the liver by a virus acquired from food.  Hepatitis B vaccine to protect against a form of infection of the liver by a virus acquired from blood or body fluids, particularly if you work in health care.  If you plan to travel internationally, check with your local health department for specific vaccination recommendations.  Cancer Screening:  Most routine colon cancer screening begins at the age of 5. On a yearly basis, doctors may provide special easy to use take-home tests to check for hidden blood in the stool. Sigmoidoscopy or colonoscopy can detect the earliest forms of colon cancer and is life saving. These tests use a small camera at the end of a tube to directly examine the colon. Speak to your caregiver about this at age 4, when routine screening begins (and is repeated every 5 years unless early forms of pre-cancerous polyps or small growths are found).   At the age of 21 men usually start screening for prostate cancer every year. Screening may begin at a younger age for those with higher risk. Those at higher risk include African-Americans or having a family history of prostate cancer. There are two types of tests for prostate cancer:   Prostate-specific antigen (PSA) testing. Recent studies raise questions about prostate cancer using PSA and you should discuss this with your caregiver.   Digital rectal exam (in which your doctor's lubricated and gloved finger feels for enlargement of the prostate through the anus).   Screening for testicular cancer.  Do a monthly exam of your testicles. Gently roll each testicle between your thumb and fingers, feeling for any abnormal lumps. The best time to do this is after a hot shower or bath when the tissues are looser. Notify your caregivers of any lumps, tenderness or changes in size or shape immediately.

## 2014-01-09 NOTE — Progress Notes (Signed)
Subjective:   Edwin Osborne is a 79 y.o. male who presents for Medicare Annual Wellness Visit and 3 month follow up on hypertension, diabetes, hyperlipidemia, vitamin D def.  Date of last medicare wellness visit is unknown.   His blood pressure has been controlled at home, today their BP is BP: 110/62 mmHg He does not workout. He denies chest pain, dizziness.  He is also here for a diabetic teaching, his A1C was worse last time. He was started on glipizide and states he is tolerating it well. He states his wife cooks and bakes. He does not check his sugar at home.  He has been coughing for the past 2 weeks, white/clear mucus, he has taken theraflu which has not helped. It is worse at night. He has some SOB after coughing. Denies fever, chills, wheezing.  He denies swelling in his legs, PND, orthopnea.   Wt Readings from Last 3 Encounters:  01/09/14 245 lb (111.131 kg)  12/01/13 239 lb (108.41 kg)  08/15/13 238 lb 6.4 oz (108.138 kg)   Names of Other Physician/Practitioners you currently use: 1. [facility]- here for primary care 2. [place], eye doctor, last visit couple of years 3. ? dentist, last visit uknown Patient Care Team: Unk Pinto, MD as PCP - General (Internal Medicine)  Medication Review Current Outpatient Prescriptions on File Prior to Visit  Medication Sig Dispense Refill  . aspirin 81 MG tablet Take 1 tablet (81 mg total) by mouth daily. Stop and restart on 09/04/11      . Cinnamon 500 MG TABS Take 1,000 mg by mouth.      . doxazosin (CARDURA) 8 MG tablet Take 4 mg by mouth Daily.      . fenofibrate micronized (LOFIBRA) 134 MG capsule Take 1 mg by mouth Daily.      . Flaxseed, Linseed, (FLAX SEED OIL) 1000 MG CAPS Take by mouth.      Marland Kitchen glipiZIDE (GLUCOTROL) 5 MG tablet Take 1 tablet (5 mg total) by mouth 2 (two) times daily.  60 tablet  11  . metFORMIN (GLUCOPHAGE-XR) 500 MG 24 hr tablet TAKE 2 TABLETS BY MOUTH TWICE A  DAY  360 tablet  2  . Multiple Vitamins-Minerals (MULTIVITAMIN WITH MINERALS) tablet Take 1 tablet by mouth daily.        . Nutritional Supplements (VITAMIN D MAINTENANCE PO) Take 10,000 mg by mouth daily.        . Omega-3 Fatty Acids (FISH OIL) 1000 MG CAPS Take by mouth.      . pravastatin (PRAVACHOL) 40 MG tablet Take 20 mg by mouth daily.        . quinapril-hydrochlorothiazide (ACCURETIC) 20-25 MG per tablet TAKE 1 TABLET BY MOUTH EVERY DAY  90 tablet  0  . Specialty Vitamins Products (MAGNESIUM, AMINO ACID CHELATE,) 133 MG tablet Take 1 tablet by mouth daily.         Current Facility-Administered Medications on File Prior to Visit  Medication Dose Route Frequency Provider Last Rate Last Dose  . 0.9 %  sodium chloride infusion  500 mL Intravenous Continuous Gatha Mayer, MD        Current Problems (verified) Patient Active Problem List   Diagnosis Date Noted  . Routine general medical examination at a health care facility 08/15/2013  . Mixed hyperlipidemia 08/15/2013  . Unspecified vitamin D deficiency 08/15/2013  . Hypertension   . Personal history of colonic polyps 04/09/2004    Screening Tests Health Maintenance  Topic Date Due  . Tetanus/tdap  [birth year]06/1956  . Zostavax  04/06/1997  . Influenza Vaccine  04/22/2014  . Colonoscopy  08/20/2014  . Pneumococcal Polysaccharide Vaccine Age 38 And Over  Completed     Immunization History  Administered Date(s) Administered  . Influenza Whole 05/24/2011, 08/06/2012, 07/07/2013  . Pneumococcal Polysaccharide-23 03/10/2005    Preventative care: Last colonoscopy: 2012 due 2015  Prior vaccinations: TD or Tdap: declines  Influenza: 2014 Pneumococcal: 2006 Shingles/Zostavax: declines  History reviewed: allergies, current medications, past family history, past medical history, past social history, past surgical history and problem list  Tobacco History  Substance Use Topics  . Smoking status: Former Smoker    Types: Cigars     Quit date: 09/23/1987  . Smokeless tobacco: Never Used  . Alcohol Use: Yes     Comment: rarely   He does not smoke.  Patient is a former smoker. Are there smokers in your home (other than you)?  No  Alcohol Current alcohol use: social drinker  Caffeine Current caffeine use: coffee 2 /day  Exercise Exercise limitations: The patient is experiencing exercise intolerance (bilateral knee pain). Current exercise: none  Nutrition/Diet Current diet: in general, an "unhealthy" diet  Cardiac risk factors: advanced age (older than 7 for men, 63 for women), diabetes mellitus, dyslipidemia, family history of premature cardiovascular disease, hypertension, male gender, obesity (BMI >= 30 kg/m2) and sedentary lifestyle.  Depression Screen (Note: if answer to either of the following is "Yes", a more complete depression screening is indicated)   Q1: Over the past two weeks, have you felt down, depressed or hopeless? No  Q2: Over the past two weeks, have you felt little interest or pleasure in doing things? No  Have you lost interest or pleasure in daily life? No  Do you often feel hopeless? No  Do you cry easily over simple problems? No  Activities of Daily Living In your present state of health, do you have any difficulty performing the following activities?:  Driving? No Managing money?  No Feeding yourself? No Getting from bed to chair? No Climbing a flight of stairs? Yes Preparing food and eating?: No Bathing or showering? No Getting dressed: No Getting to the toilet? No Using the toilet:No Moving around from place to place: No In the past year have you fallen or had a near fall?:No   Are you sexually active?  No  Do you have more than one partner?  No  Vision Difficulties: Yes  Hearing Difficulties: No Do you often ask people to speak up or repeat themselves? No Do you experience ringing or noises in your ears? No Do you have difficulty understanding soft or whispered  voices? No  Cognition  Do you feel that you have a problem with memory?Yes  Do you often misplace items? Yes  Do you feel safe at home?  Yes  Advanced directives Does patient have a [place]? No Does patient have a Living Will? No   Objective:   Blood pressure 110/62, pulse 76, temperature 98.6 F (37 C), resp. rate 16, weight 245 lb (111.131 kg). Body mass index is 35.67 kg/(m^2).  General appearance: alert, no distress, WD/WN,  male Cognitive Testing  Alert? Yes  Normal Appearance?Yes  Oriented to person? Yes  Place? Yes   Time? Yes  Recall of three objects?  No  Can perform simple calculations? No  Displays appropriate judgment?Yes  Can read the correct time from a watch face?Yes  HEENT: normocephalic, sclerae  anicteric, TMs pearly, nares patent, no discharge or erythema, pharynx normal Oral cavity: MMM, no lesions Neck: supple, no lymphadenopathy, no thyromegaly, no masses Heart: RRR, normal S1, S2, no murmurs Lungs: slight diffuse expiratory wheezes, no rhonchi, or rales Abdomen: +bs, soft, non tender, non distended, no masses, no hepatomegaly, no splenomegaly Musculoskeletal: nontender, no swelling, no obvious deformity Extremities: no edema, no cyanosis, no clubbing Pulses: 2+ symmetric, upper and lower extremities, normal cap refill Neurological: alert, oriented x 3, CN2-12 intact, strength normal upper extremities and lower extremities, sensation normal throughout, DTRs 2+ throughout, no cerebellar signs, gait normal Psychiatric: normal affect, behavior normal, pleasant  Breast: defer Gyn: defer Rectal: defer   Assessment:   SOB with wheezing- O2 98%, get CXR, zpak with refill, albuterol inhaler, prednisone 10mg  Suggest seeing ortho for knee pain for possible injections Suggest DM educator and he declines.   Plan:   During the course of the visit the patient was educated and counseled about appropriate screening and preventive services  including:    Pneumococcal vaccine   Influenza vaccine  Td vaccine  Screening electrocardiogram  Colorectal cancer screening  Diabetes screening  Glaucoma screening  Nutrition counseling   Screening recommendations, referrals:  Vaccinations: Tdap vaccine declined Influenza vaccine not indicated Pneumococcal vaccine not indicated Shingles vaccine declined Hep B vaccine not indicated  Nutrition assessed and recommended  Colonoscopy due this year Recommended yearly ophthalmology/optometry visit for glaucoma screening and checkup Recommended yearly dental visit for hygiene and checkup Advanced directives - declined  Conditions/risks identified: BMI: Discussed weight loss, diet, and increase physical activity.  Increase physical activity: AHA recommends 150 minutes of physical activity a week.  Medications reviewed Diabetes is not at goal, ACE/ARB therapy: Yes. Urinary Incontinence is an issue: discussed non pharmacology and pharmacology options.  Fall risk: moderate- discussed PT, home fall assessment, medications.   Medicare Attestation I have personally reviewed: The patient's medical and social history Their use of alcohol, tobacco or illicit drugs Their current medications and supplements The patient's functional ability including ADLs,fall risks, home safety risks, cognitive, and hearing and visual impairment Diet and physical activities Evidence for depression or mood disorders  The patient's weight, height, BMI, and visual acuity have been recorded in the chart.  I have made referrals, counseling, and provided education to the patient based on review of the above and I have provided the patient with a written personalized care plan for preventive services.     Vicie Mutters, PA-C   01/09/2014

## 2014-01-16 ENCOUNTER — Telehealth: Payer: Self-pay | Admitting: *Deleted

## 2014-01-16 ENCOUNTER — Other Ambulatory Visit: Payer: Self-pay | Admitting: Emergency Medicine

## 2014-01-16 ENCOUNTER — Ambulatory Visit (HOSPITAL_COMMUNITY)
Admission: RE | Admit: 2014-01-16 | Discharge: 2014-01-16 | Disposition: A | Discharge status: Home / Self Care | Payer: Medicare Other | Source: Ambulatory Visit | Attending: Emergency Medicine | Admitting: Emergency Medicine

## 2014-01-16 DIAGNOSIS — R05 Cough: Secondary | ICD-10-CM | POA: Insufficient documentation

## 2014-01-16 DIAGNOSIS — R079 Chest pain, unspecified: Secondary | ICD-10-CM | POA: Insufficient documentation

## 2014-01-16 DIAGNOSIS — J984 Other disorders of lung: Secondary | ICD-10-CM | POA: Insufficient documentation

## 2014-01-16 DIAGNOSIS — R059 Cough, unspecified: Secondary | ICD-10-CM | POA: Insufficient documentation

## 2014-01-16 MED ORDER — HYDROCODONE-ACETAMINOPHEN 5-325 MG PO TABS: 1.0000 | ORAL_TABLET | Freq: Four times a day (QID) | ORAL | Status: DC | PRN

## 2014-01-16 MED ORDER — MOXIFLOXACIN HCL 400 MG PO TABS: 400.0000 mg | ORAL_TABLET | Freq: Every day | ORAL | Status: AC

## 2014-01-16 NOTE — Telephone Encounter (Signed)
Told pt he needed to get a CXR [facility] can [facility] send a order over? Pt will stop by the office before driving all the way back home for further directions

## 2014-01-16 NOTE — Telephone Encounter (Signed)
Pt's wife is calling says pt still has a cough & continues to cough & now in severe pain thinks that he has cracked a rib? Pt was seen sometime last week or so

## 2014-01-17 ENCOUNTER — Ambulatory Visit: Payer: Self-pay | Admitting: Physician Assistant

## 2014-01-19 ENCOUNTER — Ambulatory Visit: Payer: Self-pay | Admitting: Emergency Medicine

## 2014-02-06 ENCOUNTER — Other Ambulatory Visit: Payer: Self-pay | Admitting: Internal Medicine

## 2014-02-23 ENCOUNTER — Ambulatory Visit: Payer: Self-pay | Admitting: Internal Medicine

## 2014-03-07 ENCOUNTER — Encounter: Payer: Self-pay | Admitting: Internal Medicine

## 2014-03-07 ENCOUNTER — Ambulatory Visit (INDEPENDENT_AMBULATORY_CARE_PROVIDER_SITE_OTHER): Payer: Medicare Other | Admitting: Internal Medicine

## 2014-03-07 VITALS — BP 126/74 | HR 60 | Temp 97.9°F | Resp 16 | Ht 69.5 in | Wt 250.6 lb

## 2014-03-07 DIAGNOSIS — Z79899 Other long term (current) drug therapy: Secondary | ICD-10-CM

## 2014-03-07 DIAGNOSIS — E782 Mixed hyperlipidemia: Secondary | ICD-10-CM

## 2014-03-07 DIAGNOSIS — E559 Vitamin D deficiency, unspecified: Secondary | ICD-10-CM

## 2014-03-07 DIAGNOSIS — Z91199 Patient's noncompliance with other medical treatment and regimen due to unspecified reason: Secondary | ICD-10-CM

## 2014-03-07 DIAGNOSIS — I10 Essential (primary) hypertension: Secondary | ICD-10-CM

## 2014-03-07 DIAGNOSIS — Z9119 Patient's noncompliance with other medical treatment and regimen: Secondary | ICD-10-CM

## 2014-03-07 DIAGNOSIS — E1129 Type 2 diabetes mellitus with other diabetic kidney complication: Secondary | ICD-10-CM

## 2014-03-07 DIAGNOSIS — E669 Obesity, unspecified: Secondary | ICD-10-CM

## 2014-03-07 DIAGNOSIS — E1122 Type 2 diabetes mellitus with diabetic chronic kidney disease: Secondary | ICD-10-CM | POA: Insufficient documentation

## 2014-03-07 DIAGNOSIS — N183 Chronic kidney disease, stage 3 unspecified: Secondary | ICD-10-CM | POA: Insufficient documentation

## 2014-03-07 LAB — CBC WITH DIFFERENTIAL/PLATELET
Basophils Absolute: 0 10*3/uL (ref 0.0–0.1)
Basophils Relative: 0 % (ref 0–1)
Eosinophils Absolute: 0.3 10*3/uL (ref 0.0–0.7)
Eosinophils Relative: 5 % (ref 0–5)
HCT: 39.7 % (ref 39.0–52.0)
Hemoglobin: 13.4 g/dL (ref 13.0–17.0)
Lymphocytes Relative: 22 % (ref 12–46)
Lymphs Abs: 1.3 10*3/uL (ref 0.7–4.0)
MCH: 29 pg (ref 26.0–34.0)
MCHC: 33.8 g/dL (ref 30.0–36.0)
MCV: 85.9 fL (ref 78.0–100.0)
Monocytes Absolute: 0.5 10*3/uL (ref 0.1–1.0)
Monocytes Relative: 8 % (ref 3–12)
Neutro Abs: 3.9 10*3/uL (ref 1.7–7.7)
Neutrophils Relative %: 65 % (ref 43–77)
Platelets: 187 10*3/uL (ref 150–400)
RBC: 4.62 MIL/uL (ref 4.22–5.81)
RDW: 14.9 % (ref 11.5–15.5)
WBC: 6 10*3/uL (ref 4.0–10.5)

## 2014-03-07 NOTE — Progress Notes (Signed)
Patient ID: Edwin Osborne, male   DOB: [birth year], 79 y.o.   MRN: [id number]    This very nice 79 y.o.MWM presents for 3 month follow up with Hypertension, Hyperlipidemia, T2_NIDDM and Vitamin D Deficiency.    HTN predates since 56. BP has been controlled at home. Today's BP: 126/74 mmHg. Patient denies any cardiac type chest pain, palpitations, dyspnea/orthopnea/PND, dizziness, claudication, or dependent edema.   Hyperlipidemia is controlled with diet & meds. Last Cholesterol/LDL were at goal as below with Trig elevated at 312.  Patient denies myalgias or other med SE's.  Lab Results  Component Value Date   CHOL 147 12/01/2013   HDL 38* 12/01/2013   LDLCALC 47 12/01/2013   TRIG 312* 12/01/2013   CHOLHDL 3.9 12/01/2013    Also, the patient has history of T2_NIDDM since 1999 and patient admits not monitoring his glucoses and last A1c was 10.4% consistent with patient's admitted poor dietary compliance. Patient denies any symptoms of reactive hypoglycemia, diabetic polys, paresthesias or visual blurring.   Further, Patient has history of Vitamin D Deficiency of 32 in 2008 and last vitamin D was 57 in Aug 2014. Patient supplements vitamin D without any suspected side-effects.    Medication List   albuterol 108 (90 BASE) MCG/ACT inhaler  Commonly known as:  PROVENTIL HFA;VENTOLIN HFA  Inhale 1-2 puffs into the lungs every 6 (six) hours as needed for wheezing or shortness of breath.     aspirin 81 MG tablet  Take 1 tablet (81 mg total) by mouth daily. Stop and restart on 09/04/11     Cinnamon 500 MG Tabs  Take 1,000 mg by mouth.     doxazosin 8 MG tablet  Commonly known as:  CARDURA  Take 4 mg by mouth Daily.     fenofibrate micronized 134 MG capsule  Commonly known as:  LOFIBRA  Take 1 mg by mouth Daily.     glipiZIDE 5 MG tablet  Commonly known as:  GLUCOTROL  Take 1 tablet (5 mg total) by mouth 2 (two) times daily.     magnesium (amino acid chelate) 133 MG tablet  Take 1  tablet by mouth daily.     metFORMIN 500 MG 24 hr tablet  Commonly known as:  GLUCOPHAGE-XR  TAKE 2 TABLETS BY MOUTH TWICE A DAY     multivitamin with minerals tablet  Take 1 tablet by mouth daily.     pravastatin 40 MG tablet  Commonly known as:  PRAVACHOL  Take 20 mg by mouth daily.     quinapril-hydrochlorothiazide 20-25 MG per tablet  Commonly known as:  ACCURETIC  TAKE 1 TABLET BY MOUTH EVERY DAY     VITAMIN D MAINTENANCE PO  Take 10,000 mg by mouth daily.       No Known Allergies  PMHx:   Past Medical History  Diagnosis Date  . Hyperlipidemia   . Hypertension   . Diabetes mellitus   . BPH (benign prostatic hyperplasia)   . Cataract     FHx:    Reviewed / unchanged  SHx:    Reviewed / unchanged   Systems Review: Constitutional: Denies fever, chills, wt changes, headaches, insomnia, fatigue, night sweats, change in appetite. Eyes: Denies redness, blurred vision, diplopia, discharge, itchy, watery eyes.  ENT: Denies discharge, congestion, post nasal drip, epistaxis, sore throat, earache, hearing loss, dental pain, tinnitus, vertigo, sinus pain, snoring.  CV: Denies chest pain, palpitations, irregular heartbeat, syncope, dyspnea, diaphoresis, orthopnea, PND, claudication or edema. Respiratory: denies cough, dyspnea,  DOE, pleurisy, hoarseness, laryngitis, wheezing.  Gastrointestinal: Denies dysphagia, odynophagia, heartburn, reflux, water brash, abdominal pain or cramps, nausea, vomiting, bloating, diarrhea, constipation, hematemesis, melena, hematochezia  or hemorrhoids. Genitourinary: Denies dysuria, frequency, urgency, nocturia, hesitancy, discharge, hematuria or flank pain. Musculoskeletal: Denies arthralgias, myalgias, stiffness, jt. swelling, pain, limping or strain/sprain.  Skin: Denies pruritus, rash, hives, warts, acne, eczema or change in skin lesion(s). Neuro: No weakness, tremor, incoordination, spasms, paresthesia or pain. Psychiatric: Denies confusion,  memory loss or sensory loss. Endo: Denies change in weight, skin or hair change.  Heme/Lymph: No excessive bleeding, bruising or enlarged lymph nodes.   Exam:  BP 126/74  Pulse 60  Temp 97.9 F   Resp 16  Ht 5' 9.5"   Wt 250 lb 9.6 oz   BMI 36.49 kg/m2  Appears well nourished - in no distress. Eyes: PERRLA, EOMs, conjunctiva no swelling or erythema. Sinuses: No frontal/maxillary tenderness ENT/Mouth: EAC's clear, TM's nl w/o erythema, bulging. Nares clear w/o erythema, swelling, exudates. Oropharynx clear without erythema or exudates. Oral hygiene is good. Tongue normal, non obstructing. Hearing intact.  Neck: Supple. Thyroid nl. Car 2+/2+ without bruits, nodes or JVD. Chest: Respirations nl with BS clear & equal w/o rales, rhonchi, wheezing or stridor.  Cor: Heart sounds normal w/ regular rate and rhythm without sig. murmurs, gallops, clicks, or rubs. Peripheral pulses normal and equal  without edema.  Abdomen: Soft & bowel sounds normal. Non-tender w/o guarding, rebound, hernias, masses, or organomegaly.  Lymphatics: Unremarkable.  Musculoskeletal: Full ROM all peripheral extremities, joint stability, 5/5 strength, and normal gait.  Skin: Warm, dry without exposed rashes, lesions or ecchymosis apparent.  Neuro: Cranial nerves intact, reflexes equal bilaterally. Sensory-motor testing grossly intact. Tendon reflexes grossly intact.  Pysch: Alert & oriented x 3. Insight and judgement nl & appropriate. No ideations.  Assessment and Plan:  1. Hypertension - Continue monitor blood pressure at home. Continue diet/meds same.  2. Hyperlipidemia - Continue diet/meds, exercise,& lifestyle modifications. Continue monitor periodic cholesterol/liver & renal functions   3. 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  4. Vitamin D  Deficiency - Continue supplementation.  5. Morbid Obesity (BMI 36.5)   6. Poor Compliance   Recommended regular exercise, BP monitoring, weight control, and discussed med and SE's. Recommended labs to assess and monitor clinical status. Further disposition pending results of labs.  Long discussion with patient regarding his attitude and poor compliance to medical recommendations and the probable poor medical outcome resultant therefore.

## 2014-03-08 LAB — BASIC METABOLIC PANEL WITH GFR
BUN: 20 mg/dL (ref 6–23)
CO2: 26 mEq/L (ref 19–32)
Calcium: 9.3 mg/dL (ref 8.4–10.5)
Chloride: 103 mEq/L (ref 96–112)
Creat: 1.41 mg/dL — ABNORMAL HIGH (ref 0.50–1.35)
GFR, Est African American: 56 mL/min — ABNORMAL LOW
GFR, Est Non African American: 48 mL/min — ABNORMAL LOW
Glucose, Bld: 165 mg/dL — ABNORMAL HIGH (ref 70–99)
Potassium: 4.4 mEq/L (ref 3.5–5.3)
Sodium: 138 mEq/L (ref 135–145)

## 2014-03-08 LAB — HEPATIC FUNCTION PANEL
ALT: 16 U/L (ref 0–53)
AST: 17 U/L (ref 0–37)
Albumin: 4.1 g/dL (ref 3.5–5.2)
Alkaline Phosphatase: 41 U/L (ref 39–117)
Bilirubin, Direct: 0.1 mg/dL (ref 0.0–0.3)
Indirect Bilirubin: 0.3 mg/dL (ref 0.2–1.2)
Total Bilirubin: 0.4 mg/dL (ref 0.2–1.2)
Total Protein: 6.5 g/dL (ref 6.0–8.3)

## 2014-03-08 LAB — LIPID PANEL
Cholesterol: 162 mg/dL (ref 0–200)
HDL: 39 mg/dL — ABNORMAL LOW (ref >39)
LDL Cholesterol: 75 mg/dL (ref 0–99)
Total CHOL/HDL Ratio: 4.2 Ratio
Triglycerides: 242 mg/dL — ABNORMAL HIGH (ref <150)
VLDL: 48 mg/dL — ABNORMAL HIGH (ref 0–40)

## 2014-03-08 LAB — HEMOGLOBIN A1C
Hgb A1c MFr Bld: 7.6 % — ABNORMAL HIGH (ref <5.7)
Mean Plasma Glucose: 171 mg/dL — ABNORMAL HIGH (ref <117)

## 2014-03-08 LAB — INSULIN, FASTING: Insulin fasting, serum: 36 u[IU]/mL — ABNORMAL HIGH (ref 3–28)

## 2014-03-08 LAB — TSH: TSH: 2.78 u[IU]/mL (ref 0.350–4.500)

## 2014-03-08 LAB — VITAMIN D 25 HYDROXY (VIT D DEFICIENCY, FRACTURES): Vit D, 25-Hydroxy: 51 ng/mL (ref 30–89)

## 2014-03-08 LAB — MAGNESIUM: Magnesium: 1.6 mg/dL (ref 1.5–2.5)

## 2014-05-06 ENCOUNTER — Other Ambulatory Visit: Payer: Self-pay | Admitting: Internal Medicine

## 2014-05-27 ENCOUNTER — Other Ambulatory Visit: Payer: Self-pay | Admitting: Internal Medicine

## 2014-06-03 ENCOUNTER — Other Ambulatory Visit: Payer: Self-pay | Admitting: Internal Medicine

## 2014-06-13 ENCOUNTER — Encounter: Payer: Self-pay | Admitting: Physician Assistant

## 2014-06-13 NOTE — Progress Notes (Signed)
This encounter was created in error - please disregard.

## 2014-06-21 ENCOUNTER — Ambulatory Visit (INDEPENDENT_AMBULATORY_CARE_PROVIDER_SITE_OTHER): Payer: Medicare Other | Admitting: Physician Assistant

## 2014-06-21 ENCOUNTER — Encounter: Payer: Self-pay | Admitting: Physician Assistant

## 2014-06-21 VITALS — BP 122/70 | HR 76 | Temp 98.2°F | Resp 16 | Ht 69.5 in | Wt 256.0 lb

## 2014-06-21 DIAGNOSIS — Z23 Encounter for immunization: Secondary | ICD-10-CM

## 2014-06-21 DIAGNOSIS — I10 Essential (primary) hypertension: Secondary | ICD-10-CM

## 2014-06-21 DIAGNOSIS — E782 Mixed hyperlipidemia: Secondary | ICD-10-CM

## 2014-06-21 DIAGNOSIS — Z91199 Patient's noncompliance with other medical treatment and regimen due to unspecified reason: Secondary | ICD-10-CM

## 2014-06-21 DIAGNOSIS — E559 Vitamin D deficiency, unspecified: Secondary | ICD-10-CM

## 2014-06-21 DIAGNOSIS — Z9119 Patient's noncompliance with other medical treatment and regimen: Secondary | ICD-10-CM

## 2014-06-21 DIAGNOSIS — E1129 Type 2 diabetes mellitus with other diabetic kidney complication: Secondary | ICD-10-CM

## 2014-06-21 DIAGNOSIS — Z79899 Other long term (current) drug therapy: Secondary | ICD-10-CM

## 2014-06-21 LAB — CBC WITH DIFFERENTIAL/PLATELET
Basophils Absolute: 0.1 10*3/uL (ref 0.0–0.1)
Basophils Relative: 1 % (ref 0–1)
Eosinophils Absolute: 0.4 10*3/uL (ref 0.0–0.7)
Eosinophils Relative: 5 % (ref 0–5)
HCT: 40.7 % (ref 39.0–52.0)
Hemoglobin: 13.9 g/dL (ref 13.0–17.0)
Lymphocytes Relative: 25 % (ref 12–46)
Lymphs Abs: 1.8 10*3/uL (ref 0.7–4.0)
MCH: 29.1 pg (ref 26.0–34.0)
MCHC: 34.2 g/dL (ref 30.0–36.0)
MCV: 85.1 fL (ref 78.0–100.0)
Monocytes Absolute: 0.7 10*3/uL (ref 0.1–1.0)
Monocytes Relative: 9 % (ref 3–12)
Neutro Abs: 4.4 10*3/uL (ref 1.7–7.7)
Neutrophils Relative %: 60 % (ref 43–77)
Platelets: 209 10*3/uL (ref 150–400)
RBC: 4.78 MIL/uL (ref 4.22–5.81)
RDW: 13.9 % (ref 11.5–15.5)
WBC: 7.3 10*3/uL (ref 4.0–10.5)

## 2014-06-21 NOTE — Patient Instructions (Signed)
Call about your meter and the name of it and we will send in strips    Bad carbs also include fruit juice, alcohol, and sweet tea. These are empty calories that do not signal to your brain that you are full.   Please remember the good carbs are still carbs which convert into sugar. So please measure them out no more than 1/2-1 cup of rice, oatmeal, pasta, and beans.  Veggies are however free foods! Pile them on.   I like lean protein at every meal such as chicken, [place], pork chops, cottage cheese, etc. Just do not fry these meats and please center your meal around vegetable, the meats should be a side dish.   No all fruit is created equal. Please see the list below, the fruit at the bottom is higher in sugars than the fruit at the top

## 2014-06-21 NOTE — Progress Notes (Signed)
Assessment and Plan:  Hypertension: Continue medication, monitor blood pressure at home. Continue DASH diet. Cholesterol: Continue diet and exercise. Check cholesterol.  Diabetes-Continue diet and exercise. Check A1C Vitamin D Def- check level and continue medications.   Continue diet and meds as discussed. Further disposition pending results of labs. Discussed med's effects and SE's.    HPI 79 y.o. male  presents for 3 month follow up with hypertension, hyperlipidemia, diabetes and vitamin D. His blood pressure has been controlled at home, today their BP is BP: 122/70 mmHg He does not workout. He denies chest pain, shortness of breath, dizziness.  He is on cholesterol medication and denies myalgias. His cholesterol is at goal. The cholesterol last visit was:   Lab Results  Component Value Date   CHOL 162 03/07/2014   HDL 39* 03/07/2014   LDLCALC 75 03/07/2014   TRIG 242* 03/07/2014   CHOLHDL 4.2 03/07/2014   He has been working on diet and exercise for Diabetes with CKD, he is on glipizide and metformin, he is on ACE and bASA, and denies polydipsia, polyuria and visual disturbances. Last A1C in the office was:  Lab Results  Component Value Date   HGBA1C 7.6* 03/07/2014   Patient is on Vitamin D supplement. Lab Results  Component Value Date   VD25OH 51 03/07/2014      Current Medications:  Current Outpatient Prescriptions on File Prior to Visit  Medication Sig Dispense Refill  . albuterol (PROVENTIL HFA;VENTOLIN HFA) 108 (90 BASE) MCG/ACT inhaler Inhale 1-2 puffs into the lungs every 6 (six) hours as needed for wheezing or shortness of breath.  18 g  2  . aspirin 81 MG tablet Take 1 tablet (81 mg total) by mouth daily. Stop and restart on 09/04/11      . Cinnamon 500 MG TABS Take 1,000 mg by mouth.      . doxazosin (CARDURA) 8 MG tablet TAKE 1 TABLET BY MOUTH FOR BLOOD PRESSURE / PROSTATE  90 tablet  2  . fenofibrate micronized (LOFIBRA) 134 MG capsule TAKE ONE CAPSULE EVERY DAY  90  capsule  0  . glipiZIDE (GLUCOTROL) 5 MG tablet Take 1 tablet (5 mg total) by mouth 2 (two) times daily.  60 tablet  11  . metFORMIN (GLUCOPHAGE-XR) 500 MG 24 hr tablet TAKE 2 TABLETS BY MOUTH TWICE A DAY  360 tablet  1  . Multiple Vitamins-Minerals (MULTIVITAMIN WITH MINERALS) tablet Take 1 tablet by mouth daily.        . Nutritional Supplements (VITAMIN D MAINTENANCE PO) Take 10,000 mg by mouth daily.        . pravastatin (PRAVACHOL) 40 MG tablet Take 20 mg by mouth daily.        . quinapril-hydrochlorothiazide (ACCURETIC) 20-25 MG per tablet TAKE 1 TABLET BY MOUTH EVERY DAY  90 tablet  0  . Specialty Vitamins Products (MAGNESIUM, AMINO ACID CHELATE,) 133 MG tablet Take 1 tablet by mouth daily.         No current facility-administered medications on file prior to visit.   Medical History:  Past Medical History  Diagnosis Date  . Hyperlipidemia   . Hypertension   . Diabetes mellitus   . BPH (benign prostatic hyperplasia)   . Cataract    Allergies: No Known Allergies   Review of Systems: [X]  = complains of  [ ]  = denies  General: Fatigue [ ]  Fever [ ]  Chills [ ]  Weakness [ ]   Insomnia [ ]  Eyes: Redness [ ]  Blurred vision [ ]   Diplopia [ ]   ENT: Congestion [ ]  Sinus Pain [ ]  Post Nasal Drip [ ]  Sore Throat [ ]  Earache [ ]   Cardiac: Chest pain/pressure [ ]  SOB [ ]  Orthopnea [ ]   Palpitations [ ]   Paroxysmal nocturnal dyspnea[ ]  Claudication [ ]  Edema [ ]   Pulmonary: Cough [ ]  Wheezing[ ]   SOB [ ]   Snoring [ ]   GI: Nausea [ ]  Vomiting[ ]  Dysphagia[ ]  Heartburn[ ]  Abdominal pain [ ]  Constipation [ ] ; Diarrhea [ ] ; BRBPR [ ]  Melena[ ]  GU: Hematuria[ ]  Dysuria [ ]  Nocturia[ ]  Urgency [ ]   Hesitancy [ ]  Discharge [ ]  Neuro: Headaches[ ]  Vertigo[ ]  Paresthesias[ ]  Spasm [ ]  Speech changes [ ]  Incoordination [ ]   Ortho: Arthritis [ ]  Joint pain [ ]  Muscle pain [ ]  Joint swelling [ ]  Back Pain [ ]  Skin:  Rash [ ]   Pruritis [ ]  Change in skin lesion [ ]   Psych: Depression[ ]  Anxiety[ ]   Confusion [ ]  Memory loss [ ]   Heme/Lypmh: Bleeding [ ]  Bruising [ ]  Enlarged lymph nodes [ ]   Endocrine: Visual blurring [ ]  Paresthesia [ ]  Polyuria [ ]  Polydypsea [ ]    Heat/cold intolerance [ ]  Hypoglycemia [ ]   Family history- Review and unchanged Social history- Review and unchanged Physical Exam: BP 122/70  Pulse 76  Temp(Src) 98.2 F (36.8 C)  Resp 16  Ht 5' 9.5" (1.765 m)  Wt 256 lb (116.121 kg)  BMI 37.28 kg/m2 Wt Readings from Last 3 Encounters:  06/21/14 256 lb (116.121 kg)  03/07/14 250 lb 9.6 oz (113.671 kg)  01/09/14 245 lb (111.131 kg)   General Appearance: Well nourished, in no apparent distress. Eyes: PERRLA, EOMs, conjunctiva no swelling or erythema Sinuses: No Frontal/maxillary tenderness ENT/Mouth: Ext aud canals clear, TMs without erythema, bulging. No erythema, swelling, or exudate on post pharynx.  Tonsils not swollen or erythematous. Hearing normal.  Neck: Supple, thyroid normal.  Respiratory: Respiratory effort normal, BS equal bilaterally without rales, rhonchi, wheezing or stridor.  Cardio: RRR with no MRGs. Brisk peripheral pulses without edema.  Abdomen: Soft, + BS.  Non tender, no guarding, rebound, hernias, masses. Lymphatics: Non tender without lymphadenopathy.  Musculoskeletal: Full ROM, 5/5 strength, normal gait.  Skin: Warm, dry without rashes, lesions, ecchymosis.  Neuro: Cranial nerves intact. No cerebellar symptoms. Sensation intact.  Psych: Awake and oriented X 3, normal affect, Insight and Judgment appropriate.    Vicie Mutters 2:05 PM

## 2014-06-22 LAB — LIPID PANEL
Cholesterol: 143 mg/dL (ref 0–200)
HDL: 36 mg/dL — ABNORMAL LOW (ref >39)
LDL Cholesterol: 28 mg/dL (ref 0–99)
Total CHOL/HDL Ratio: 4 Ratio
Triglycerides: 395 mg/dL — ABNORMAL HIGH (ref <150)
VLDL: 79 mg/dL — ABNORMAL HIGH (ref 0–40)

## 2014-06-22 LAB — BASIC METABOLIC PANEL WITH GFR
BUN: 25 mg/dL — ABNORMAL HIGH (ref 6–23)
CO2: 25 mEq/L (ref 19–32)
Calcium: 10.1 mg/dL (ref 8.4–10.5)
Chloride: 102 mEq/L (ref 96–112)
Creat: 1.64 mg/dL — ABNORMAL HIGH (ref 0.50–1.35)
GFR, Est African American: 46 mL/min — ABNORMAL LOW
GFR, Est Non African American: 40 mL/min — ABNORMAL LOW
Glucose, Bld: 210 mg/dL — ABNORMAL HIGH (ref 70–99)
Potassium: 4.3 mEq/L (ref 3.5–5.3)
Sodium: 139 mEq/L (ref 135–145)

## 2014-06-22 LAB — INSULIN, FASTING: Insulin fasting, serum: 37.9 u[IU]/mL — ABNORMAL HIGH (ref 2.0–19.6)

## 2014-06-22 LAB — HEPATIC FUNCTION PANEL
ALT: 20 U/L (ref 0–53)
AST: 22 U/L (ref 0–37)
Albumin: 4.6 g/dL (ref 3.5–5.2)
Alkaline Phosphatase: 57 U/L (ref 39–117)
Bilirubin, Direct: 0.1 mg/dL (ref 0.0–0.3)
Indirect Bilirubin: 0.3 mg/dL (ref 0.2–1.2)
Total Bilirubin: 0.4 mg/dL (ref 0.2–1.2)
Total Protein: 6.8 g/dL (ref 6.0–8.3)

## 2014-06-22 LAB — MAGNESIUM: Magnesium: 1.9 mg/dL (ref 1.5–2.5)

## 2014-06-22 LAB — HEMOGLOBIN A1C
Hgb A1c MFr Bld: 8.9 % — ABNORMAL HIGH (ref <5.7)
Mean Plasma Glucose: 209 mg/dL — ABNORMAL HIGH (ref <117)

## 2014-06-22 LAB — TSH: TSH: 2.924 u[IU]/mL (ref 0.350–4.500)

## 2014-06-22 LAB — VITAMIN D 25 HYDROXY (VIT D DEFICIENCY, FRACTURES): Vit D, 25-Hydroxy: 56 ng/mL (ref 30–89)

## 2014-06-26 ENCOUNTER — Other Ambulatory Visit: Payer: Self-pay | Admitting: *Deleted

## 2014-06-26 MED ORDER — GLUCOSE BLOOD VI STRP: ORAL_STRIP | Status: DC

## 2014-06-28 ENCOUNTER — Other Ambulatory Visit: Payer: Self-pay | Admitting: Physician Assistant

## 2014-06-28 MED ORDER — GLUCOSE BLOOD VI STRP: ORAL_STRIP | Status: DC

## 2014-08-09 ENCOUNTER — Other Ambulatory Visit: Payer: Self-pay

## 2014-08-09 MED ORDER — QUINAPRIL-HYDROCHLOROTHIAZIDE 20-25 MG PO TABS: 1.0000 | ORAL_TABLET | Freq: Every day | ORAL | Status: DC

## 2014-08-25 ENCOUNTER — Encounter: Payer: Self-pay | Admitting: Internal Medicine

## 2014-09-04 ENCOUNTER — Encounter: Payer: Self-pay | Admitting: Internal Medicine

## 2014-09-04 ENCOUNTER — Other Ambulatory Visit: Payer: Self-pay | Admitting: *Deleted

## 2014-09-04 MED ORDER — FENOFIBRATE MICRONIZED 134 MG PO CAPS: 134.0000 mg | ORAL_CAPSULE | Freq: Every day | ORAL | Status: DC

## 2014-09-07 ENCOUNTER — Encounter: Payer: Self-pay | Admitting: Internal Medicine

## 2014-09-12 ENCOUNTER — Encounter: Payer: Self-pay | Admitting: Internal Medicine

## 2014-09-16 ENCOUNTER — Other Ambulatory Visit: Payer: Self-pay | Admitting: Internal Medicine

## 2014-10-18 ENCOUNTER — Ambulatory Visit (INDEPENDENT_AMBULATORY_CARE_PROVIDER_SITE_OTHER): Payer: Medicare Other | Admitting: Internal Medicine

## 2014-10-18 ENCOUNTER — Encounter: Payer: Self-pay | Admitting: Internal Medicine

## 2014-10-18 VITALS — BP 166/84 | HR 88 | Temp 97.9°F | Resp 16 | Ht 69.5 in | Wt 253.0 lb

## 2014-10-18 DIAGNOSIS — Z9181 History of falling: Secondary | ICD-10-CM

## 2014-10-18 DIAGNOSIS — E1121 Type 2 diabetes mellitus with diabetic nephropathy: Secondary | ICD-10-CM

## 2014-10-18 DIAGNOSIS — Z79899 Other long term (current) drug therapy: Secondary | ICD-10-CM

## 2014-10-18 DIAGNOSIS — Z125 Encounter for screening for malignant neoplasm of prostate: Secondary | ICD-10-CM

## 2014-10-18 DIAGNOSIS — E782 Mixed hyperlipidemia: Secondary | ICD-10-CM

## 2014-10-18 DIAGNOSIS — Z1331 Encounter for screening for depression: Secondary | ICD-10-CM

## 2014-10-18 DIAGNOSIS — Z1212 Encounter for screening for malignant neoplasm of rectum: Secondary | ICD-10-CM

## 2014-10-18 DIAGNOSIS — I10 Essential (primary) hypertension: Secondary | ICD-10-CM

## 2014-10-18 DIAGNOSIS — E559 Vitamin D deficiency, unspecified: Secondary | ICD-10-CM

## 2014-10-18 LAB — CBC WITH DIFFERENTIAL/PLATELET
Basophils Absolute: 0 10*3/uL (ref 0.0–0.1)
Basophils Relative: 0 % (ref 0–1)
Eosinophils Absolute: 0.2 10*3/uL (ref 0.0–0.7)
Eosinophils Relative: 3 % (ref 0–5)
HCT: 41.9 % (ref 39.0–52.0)
Hemoglobin: 13.7 g/dL (ref 13.0–17.0)
Lymphocytes Relative: 20 % (ref 12–46)
Lymphs Abs: 1.6 10*3/uL (ref 0.7–4.0)
MCH: 29 pg (ref 26.0–34.0)
MCHC: 32.7 g/dL (ref 30.0–36.0)
MCV: 88.6 fL (ref 78.0–100.0)
MPV: 10.9 fL (ref 8.6–12.4)
Monocytes Absolute: 0.8 10*3/uL (ref 0.1–1.0)
Monocytes Relative: 10 % (ref 3–12)
Neutro Abs: 5.3 10*3/uL (ref 1.7–7.7)
Neutrophils Relative %: 67 % (ref 43–77)
Platelets: 234 10*3/uL (ref 150–400)
RBC: 4.73 MIL/uL (ref 4.22–5.81)
RDW: 13.8 % (ref 11.5–15.5)
WBC: 7.9 10*3/uL (ref 4.0–10.5)

## 2014-10-18 NOTE — Progress Notes (Signed)
Patient ID: Edwin Osborne, male   DOB: 12-30-1934, 80 y.o.   MRN: [id number]  Annual Comprehensive Examination  This very nice 80 y.o. MWM presents for complete physical.  Patient has been followed for HTN, T2DM w/CKD, Hyperlipidemia, and Vitamin D Deficiency.   HTN predates since 52. Patient's BP has been controlled at home.Today's BP was 166/84 rechecked at 148/82. Patient denies any cardiac symptoms as chest pain, palpitations, shortness of breath, dizziness or ankle swelling.   Patient's hyperlipidemia is controlled with diet and medications. Patient denies myalgias or other medication SE's. Last lipids were at goal -  Total Chol 143; HDL 36*; LDL  28;with  Elevated Trig 395 on 06/21/2014.   Patient has Morbid Obesity (BMI 36.84) and consequent T2_NIDDM w/ Stage 3 CKD (GFR 40 ml/min) since 1999 and patient denies reactive hypoglycemic symptoms, visual blurring, diabetic polys or paresthesias. Patient has been poorly compliant and admits not monitoring CBG's. Last A1c was  8.9% on 06/21/2014.   Finally, patient has history of Vitamin D Deficiency of  32 in 2008 and last vitamin D was 56 on 06/21/2014.  Medication Sig  . albuterol  HFA inhaler Inhale 1-2 puffs into the lungs every 6 (six) hours as needed for wheezing or shortness of breath.  Marland Kitchen aspirin 81 MG tablet Take 1 tablet (81 mg total) by mouth daily. Stop and restart on 09/04/11  . Cinnamon 500 MG TABS Take 1,000 mg by mouth.  . doxazosin (CARDURA) 8 MG tablet TAKE 1 TABLET BY MOUTH FOR BLOOD PRESSURE / PROSTATE  . fenofibrate micronized (LOFIBRA) 134 MG capsule Take 1 capsule (134 mg total) by mouth daily.  Marland Kitchen glipiZIDE (GLUCOTROL) 5 MG tablet Take 1 tablet (5 mg total) by mouth 2 (two) times daily.  Marland Kitchen glucose blood (FREESTYLE LITE) test strip Test sugars 2-3 times daily for fluctuating sugars. DX 250.40  . metFORMIN-XR 500 MG 24 hr tablet TAKE 2 TABLETS BY MOUTH TWICE A DAY  . MULTIVITAMIN WITH MINERALS Take 1 tablet by mouth daily.     . Nutritional Supplements (VITAMIN D MAINTENANCE PO) Take 10,000 mg by mouth daily.    . pravastatin (PRAVACHOL) 40 MG tablet TAKE 1 TABLET BY MOUTH AT BEDTIME FOR CHOLESTEROL  . quinapril-hydrochlorothiazide (ACCURETIC) 20-25 MG per tablet Take 1 tablet by mouth daily.  Marland Kitchen MAGNESIUM, AMINO ACID CHELATE 133 MG tablet Take 1 tablet by mouth daily.     No Known Allergies   Past Medical History  Diagnosis Date  . Hyperlipidemia   . Hypertension   . Diabetes mellitus   . BPH (benign prostatic hyperplasia)   . Cataract    Health Maintenance  Topic Date Due  . FOOT EXAM  04/07/1947  . OPHTHALMOLOGY EXAM  04/07/1947  . TETANUS/TDAP  04/06/1956  . ZOSTAVAX  04/06/1997  . URINE MICROALBUMIN  08/15/2014  . COLONOSCOPY  08/20/2014  . HEMOGLOBIN A1C  12/20/2014  . INFLUENZA VACCINE  04/23/2015  . PNEUMOCOCCAL POLYSACCHARIDE VACCINE AGE 65 AND OVER  Completed   Immunization History  Administered Date(s) Administered  . Influenza Whole 05/24/2011, 08/06/2012, 07/07/2013  . Influenza, High Dose Seasonal PF 06/21/2014  . Pneumococcal Conjugate-13 06/21/2014  . Pneumococcal Polysaccharide-23 03/10/2005   Past Surgical History  Procedure Laterality Date  . Tonsillectomy and adenoidectomy    . Vein ligation and stripping      left  . Knee arthroscopy    . Colonoscopy  2005, 08/21/11    2005 tubulovillous adenoma and adenomas 2012 - 3 adenomas, largest 10  mm, diverticulosis   History   Social History  . Marital Status: Married    Spouse Name: N/A    Number of Children: N/A  . Years of Education: N/A   Occupational History  . Not on file.   Social History Main Topics  . Smoking status: Former Smoker    Types: Cigars    Quit date: 09/23/1987  . Smokeless tobacco: Never Used  . Alcohol Use: Yes     Comment: rarely  . Drug Use: No  . Sexual Activity: Not on file    ROS Constitutional: Denies fever, chills, weight loss/gain, headaches, insomnia, fatigue, night sweats or  change in appetite. Eyes: Denies redness, blurred vision, diplopia, discharge, itchy or watery eyes.  ENT: Denies discharge, congestion, post nasal drip, epistaxis, sore throat, earache, hearing loss, dental pain, Tinnitus, Vertigo, Sinus pain or snoring.  Cardio: Denies chest pain, palpitations, irregular heartbeat, syncope, dyspnea, diaphoresis, orthopnea, PND, claudication or edema Respiratory: denies cough, dyspnea, DOE, pleurisy, hoarseness, laryngitis or wheezing.  Gastrointestinal: Denies dysphagia, heartburn, reflux, water brash, pain, cramps, nausea, vomiting, bloating, diarrhea, constipation, hematemesis, melena, hematochezia, jaundice or hemorrhoids Genitourinary: Denies dysuria, frequency, urgency, nocturia, hesitancy, discharge, hematuria or flank pain Musculoskeletal: Denies arthralgia, myalgia, stiffness, Jt. Swelling, pain, limp or strain/sprain. Denies Falls. Skin: Denies puritis, rash, hives, warts, acne, eczema or change in skin lesion Neuro: No weakness, tremor, incoordination, spasms, paresthesia or pain Psychiatric: Denies confusion, memory loss or sensory loss. Denies Depression. Endocrine: Denies change in weight, skin, hair change, nocturia, and paresthesia, diabetic polys, visual blurring or hyper / hypo glycemic episodes.  Heme/Lymph: No excessive bleeding, bruising or enlarged lymph nodes.  Physical Exam  BP 166/84   Pulse 88  Temp 97.9 F   Resp 16  Ht 5' 9.5"   Wt 253 lb     BMI 36.84   General Appearance: Well nourished, in no apparent distress. Eyes: PERRLA, EOMs, conjunctiva no swelling or erythema, normal fundi and vessels. Sinuses: No frontal/maxillary tenderness ENT/Mouth: EACs patent / TMs  nl. Nares clear without erythema, swelling, mucoid exudates. Oral hygiene is good. No erythema, swelling, or exudate. Tongue normal, non-obstructing. Tonsils not swollen or erythematous. Hearing normal.  Neck: Supple, thyroid normal. No bruits, nodes or  JVD. Respiratory: Respiratory effort normal.  BS equal and clear bilateral without rales, rhonci, wheezing or stridor. Cardio: Heart sounds are normal with regular rate and rhythm and no murmurs, rubs or gallops. Peripheral pulses are normal and equal bilaterally without edema. No aortic or femoral bruits. Chest: symmetric with normal excursions and percussion.  Abdomen: Flat, soft, with bowl sounds. Nontender, no guarding, rebound, hernias, masses, or organomegaly.  Lymphatics: Non tender without lymphadenopathy.  Genitourinary: No hernias.Testes nl. DRE - prostate nl for age - smooth & firm w/o nodules. Musculoskeletal: Full ROM all peripheral extremities, joint stability, 5/5 strength, and normal gait. Skin: Warm and dry without rashes, lesions, cyanosis, clubbing or  ecchymosis.  Neuro: Cranial nerves intact, reflexes equal bilaterally. Normal muscle tone, no cerebellar symptoms. Sensation intact by 2 pt discrimination & monofilament testing to the toes.  Pysch: Awake and oriented X 3 with normal affect, insight and judgment appropriate.   Assessment and Plan  1. Essential hypertension  - Microalbumin / creatinine urine ratio - EKG 12-Lead - [place], RETROPERITNL ABD,  LTD - TSH  2. Hyperlipidemia  - Lipid panel  3. Morbid obesity (BMI 36.5)   4. Type II diabetes mellitus with nephropathy  - [facility] DIABETES FOOT EXAM - LOW EXTREMITY NEUR  EXAM DOCUM - Hemoglobin A1c - Insulin, fasting  5. Vitamin D deficiency  - Vit D  25 hydroxy (rtn osteoporosis monitoring)  6. Screening for rectal cancer  - POC Hemoccult Bld/Stl (3-Cd Home Screen); Future  7. Prostate cancer screening  - PSA  8. Depression screen   9. At low risk for fall   10. Medication management  - Urine Microscopic - CBC with Differential/Platelet - BASIC METABOLIC PANEL WITH GFR - Hepatic function panel - Magnesium   Continue prudent diet as discussed, weight control, BP monitoring, regular exercise,  and medications as discussed.  Discussed med effects and SE's. Routine screening labs and tests as requested with regular follow-up as recommended.

## 2014-10-19 ENCOUNTER — Telehealth: Payer: Self-pay | Admitting: *Deleted

## 2014-10-19 LAB — LIPID PANEL
Cholesterol: 154 mg/dL (ref 0–200)
HDL: 35 mg/dL — ABNORMAL LOW (ref >39)
Total CHOL/HDL Ratio: 4.4 Ratio
Triglycerides: 526 mg/dL — ABNORMAL HIGH (ref <150)

## 2014-10-19 LAB — HEMOGLOBIN A1C
Hgb A1c MFr Bld: 8.8 % — ABNORMAL HIGH (ref <5.7)
Mean Plasma Glucose: 206 mg/dL — ABNORMAL HIGH (ref <117)

## 2014-10-19 LAB — HEPATIC FUNCTION PANEL
ALT: 19 U/L (ref 0–53)
AST: 22 U/L (ref 0–37)
Albumin: 4.2 g/dL (ref 3.5–5.2)
Alkaline Phosphatase: 66 U/L (ref 39–117)
Bilirubin, Direct: 0.1 mg/dL (ref 0.0–0.3)
Indirect Bilirubin: 0.2 mg/dL (ref 0.2–1.2)
Total Bilirubin: 0.3 mg/dL (ref 0.2–1.2)
Total Protein: 6.5 g/dL (ref 6.0–8.3)

## 2014-10-19 LAB — MICROALBUMIN / CREATININE URINE RATIO
Creatinine, Urine: 166 mg/dL
Microalb Creat Ratio: 10.2 mg/g (ref 0.0–30.0)
Microalb, Ur: 1.7 mg/dL (ref <2.0)

## 2014-10-19 LAB — MAGNESIUM: Magnesium: 1.7 mg/dL (ref 1.5–2.5)

## 2014-10-19 LAB — URINALYSIS, MICROSCOPIC ONLY
Casts: NONE SEEN
Crystals: NONE SEEN
Squamous Epithelial / LPF: NONE SEEN

## 2014-10-19 LAB — BASIC METABOLIC PANEL WITH GFR
BUN: 19 mg/dL (ref 6–23)
CO2: 24 mEq/L (ref 19–32)
Calcium: 9.4 mg/dL (ref 8.4–10.5)
Chloride: 103 mEq/L (ref 96–112)
Creat: 1.56 mg/dL — ABNORMAL HIGH (ref 0.50–1.35)
GFR, Est African American: 49 mL/min — ABNORMAL LOW
GFR, Est Non African American: 42 mL/min — ABNORMAL LOW
Glucose, Bld: 223 mg/dL — ABNORMAL HIGH (ref 70–99)
Potassium: 4.3 mEq/L (ref 3.5–5.3)
Sodium: 137 mEq/L (ref 135–145)

## 2014-10-19 LAB — VITAMIN D 25 HYDROXY (VIT D DEFICIENCY, FRACTURES): Vit D, 25-Hydroxy: 38 ng/mL (ref 30–100)

## 2014-10-19 LAB — INSULIN, FASTING: Insulin fasting, serum: 38.8 u[IU]/mL — ABNORMAL HIGH (ref 2.0–19.6)

## 2014-10-19 LAB — PSA: PSA: 1.01 ng/mL (ref <=4.00)

## 2014-10-19 LAB — TSH: TSH: 2.914 u[IU]/mL (ref 0.350–4.500)

## 2014-10-19 NOTE — Telephone Encounter (Signed)
Pt aware of lab results 

## 2014-11-06 ENCOUNTER — Encounter: Payer: Self-pay | Admitting: *Deleted

## 2014-12-18 ENCOUNTER — Other Ambulatory Visit: Payer: Self-pay | Admitting: Physician Assistant

## 2014-12-26 ENCOUNTER — Encounter: Payer: Self-pay | Admitting: Internal Medicine

## 2015-01-01 ENCOUNTER — Other Ambulatory Visit: Payer: Self-pay | Admitting: Internal Medicine

## 2015-01-24 ENCOUNTER — Ambulatory Visit (INDEPENDENT_AMBULATORY_CARE_PROVIDER_SITE_OTHER): Payer: Medicare Other | Admitting: Physician Assistant

## 2015-01-24 ENCOUNTER — Encounter: Payer: Self-pay | Admitting: Physician Assistant

## 2015-01-24 VITALS — BP 132/70 | HR 60 | Temp 97.7°F | Resp 16 | Ht 69.5 in | Wt 246.0 lb

## 2015-01-24 DIAGNOSIS — Z1331 Encounter for screening for depression: Secondary | ICD-10-CM

## 2015-01-24 DIAGNOSIS — E1121 Type 2 diabetes mellitus with diabetic nephropathy: Secondary | ICD-10-CM

## 2015-01-24 DIAGNOSIS — Z91199 Patient's noncompliance with other medical treatment and regimen due to unspecified reason: Secondary | ICD-10-CM

## 2015-01-24 DIAGNOSIS — E782 Mixed hyperlipidemia: Secondary | ICD-10-CM

## 2015-01-24 DIAGNOSIS — R6889 Other general symptoms and signs: Secondary | ICD-10-CM

## 2015-01-24 DIAGNOSIS — Z9119 Patient's noncompliance with other medical treatment and regimen: Secondary | ICD-10-CM

## 2015-01-24 DIAGNOSIS — Z0001 Encounter for general adult medical examination with abnormal findings: Secondary | ICD-10-CM

## 2015-01-24 DIAGNOSIS — R0602 Shortness of breath: Secondary | ICD-10-CM

## 2015-01-24 DIAGNOSIS — E559 Vitamin D deficiency, unspecified: Secondary | ICD-10-CM

## 2015-01-24 DIAGNOSIS — Z86010 Personal history of colonic polyps: Secondary | ICD-10-CM

## 2015-01-24 DIAGNOSIS — Z79899 Other long term (current) drug therapy: Secondary | ICD-10-CM

## 2015-01-24 DIAGNOSIS — I10 Essential (primary) hypertension: Secondary | ICD-10-CM

## 2015-01-24 DIAGNOSIS — Z9181 History of falling: Secondary | ICD-10-CM

## 2015-01-24 LAB — CBC WITH DIFFERENTIAL/PLATELET
Basophils Absolute: 0 10*3/uL (ref 0.0–0.1)
Basophils Relative: 0 % (ref 0–1)
Eosinophils Absolute: 0.2 10*3/uL (ref 0.0–0.7)
Eosinophils Relative: 3 % (ref 0–5)
HCT: 41.2 % (ref 39.0–52.0)
Hemoglobin: 14.1 g/dL (ref 13.0–17.0)
Lymphocytes Relative: 22 % (ref 12–46)
Lymphs Abs: 1.6 10*3/uL (ref 0.7–4.0)
MCH: 29.6 pg (ref 26.0–34.0)
MCHC: 34.2 g/dL (ref 30.0–36.0)
MCV: 86.4 fL (ref 78.0–100.0)
MPV: 10.3 fL (ref 8.6–12.4)
Monocytes Absolute: 0.7 10*3/uL (ref 0.1–1.0)
Monocytes Relative: 9 % (ref 3–12)
Neutro Abs: 4.9 10*3/uL (ref 1.7–7.7)
Neutrophils Relative %: 66 % (ref 43–77)
Platelets: 206 10*3/uL (ref 150–400)
RBC: 4.77 MIL/uL (ref 4.22–5.81)
RDW: 14 % (ref 11.5–15.5)
WBC: 7.4 10*3/uL (ref 4.0–10.5)

## 2015-01-24 LAB — HEMOGLOBIN A1C
Hgb A1c MFr Bld: 8.5 % — ABNORMAL HIGH (ref <5.7)
Mean Plasma Glucose: 197 mg/dL — ABNORMAL HIGH (ref <117)

## 2015-01-24 NOTE — Patient Instructions (Addendum)
Call your GI doctor Dr. Carlean Purl or we can do cologuard Cologuard is an easy to use noninvasive colon cancer screening test based on the latest advances in stool DNA science.   Colon cancer is 3rd most diagnosed cancer and 2nd leading cause of death in both men and women 78 years of age and older despite being one of the most preventable and treatable cancers if found early. You have agreed to do a Cologuard screening and have declined a colonoscopy in spite of being explained the risks and benefits of the colonoscopy in detail, including cancer and death. Please understand that this is test not as sensitive or specific as a colonoscopy and you are still recommended to get a colonoscopy.   If you are NOT medicare please call your insurance company and given them this CPT code, [phone], in order to see how much your insurance company will cover.   You will receive a short call from [place] support center at [employer], when you receive a call they will say they are from [place],  to confirm your mailing address and give you more information.  When they calll you, it will appear on the caller ID as "[facility]" or in some cases only this number will appear, [phone].   [facility] [employer] will ship your collection kit directly to you. You will collect a single stool sample in the privacy of your own home, no special preparation required. You will return the kit via [place] pre-paid shipping or pick-up, in the same box it arrived in. Then I will contact you to discuss your results after I receive them from the laboratory.   If you have any questions or concerns, Cologuard Customer Support Specialist are available 24 hours a day, 7 days a week at [phone] or go to [email or web address].      Diabetes is a very complicated disease...lets simplify it.  An easy way to look at it to understand the complications is if you think of the extra sugar floating in  your blood stream as glass shards floating through your blood stream.    Diabetes affects your small vessels first: 1) The glass shards (sugar) scraps down the tiny blood vessels in your eyes and lead to diabetic retinopathy, the leading cause of blindness in the [place]. Diabetes is the leading cause of newly diagnosed adult (48 to 78 years of age) blindness in the [place].  2) The glass shards scratches down the tiny vessels of your legs leading to nerve damage called neuropathy and can lead to amputations of your feet. More than 60% of all non-traumatic amputations of lower limbs occur in people with diabetes.  3) Over time the small vessels in your brain are shredded and closed off, individually this does not cause any problems but over a long period of time many of the small vessels being blocked can lead to Vascular Dementia.   4) Your kidney's are a filter system and have a "net" that keeps certain things in the body and lets bad things out. Sugar shreds this net and leads to kidney damage and eventually failure. Decreasing the sugar that is destroying the net and certain blood pressure medications can help stop or decrease progression of kidney disease. Diabetes was the primary cause of kidney failure in 44 percent of all new cases in 2011.  5) Diabetes also destroys the small vessels in your penis that lead to erectile dysfunction. Eventually the vessels are so damaged that you  may not be responsive to cialis or viagra.   Diabetes and your large vessels: Your larger vessels consist of your coronary arteries in your heart and the carotid vessels to your brain. Diabetes or even increased sugars put you at 300% increased risk of heart attack and stroke and this is why.. The sugar scrapes down your large blood vessels and your body sees this as an internal injury and tries to repair itself. Just like you get a scab on your skin, your platelets will stick to the blood vessel wall trying to heal  it. This is why we have diabetics on low dose aspirin daily, this prevents the platelets from sticking and can prevent plaque formation. In addition, your body takes cholesterol and tries to shove it into the open wound. This is why we want your LDL, or bad cholesterol, below 70.   The combination of platelets and cholesterol over 5-10 years forms plaque that can break off and cause a heart attack or stroke.   PLEASE REMEMBER:  Diabetes is preventable! Up to 66 percent of complications and morbidities among individuals with type 2 diabetes can be prevented, delayed, or effectively treated and minimized with regular visits to a health professional, appropriate monitoring and medication, and a healthy diet and lifestyle.     Bad carbs also include fruit juice, alcohol, and sweet tea. These are empty calories that do not signal to your brain that you are full.   Please remember the good carbs are still carbs which convert into sugar. So please measure them out no more than 1/2-1 cup of rice, oatmeal, pasta, and beans  Veggies are however free foods! Pile them on.   Not all fruit is created equal. Please see the list below, the fruit at the bottom is higher in sugars than the fruit at the top. Please avoid all dried fruits.    Before you even begin to attack a weight-loss plan, it pays to remember this: You are not fat. You have fat. Losing weight isn't about blame or shame; it's simply another achievement to accomplish. Dieting is like any other skill-you have to buckle down and work at it. As long as you act in a smart, reasonable way, you'll ultimately get where you want to be. Here are some weight loss pearls for you.  1. It's Not a Diet. It's a Lifestyle Thinking of a diet as something you're on and suffering through only for the short term doesn't work. To shed weight and keep it off, you need to make permanent changes to the way you eat. It's OK to indulge occasionally, of course, but if you  cut calories temporarily and then revert to your old way of eating, you'll gain back the weight quicker than you can say yo-yo. Use it to lose it. Research shows that one of the best predictors of long-term weight loss is how many pounds you drop in the first month. For that reason, nutritionists often suggest being stricter for the first two weeks of your new eating strategy to build momentum. Cut out added sugar and alcohol and avoid unrefined carbs. After that, figure out how you can reincorporate them in a way that's healthy and maintainable.  2. There's a Right Way to Exercise Working out burns calories and fat and boosts your metabolism by building muscle. But those trying to lose weight are notorious for overestimating the number of calories they burn and underestimating the amount they take in. Unfortunately, your system is biologically programmed  to hold on to extra pounds and that means when you start exercising, your body senses the deficit and ramps up its hunger signals. If you're not diligent, you'll eat everything you burn and then some. Use it to lose it. Cardio gets all the exercise glory, but strength and interval training are the real heroes. They help you build lean muscle, which in turn increases your metabolism and calorie-burning ability 3. Don't Overreact to Mild Hunger Some people have a hard time losing weight because of hunger anxiety. To them, being hungry is bad-something to be avoided at all costs-so they carry snacks with them and eat when they don't need to. Others eat because they're stressed out or bored. While you never want to get to the point of being ravenous (that's when bingeing is likely to happen), a hunger pang, a craving, or the fact that it's 3:00 p.m. should not send you racing for the vending machine or obsessing about the energy bar in your purse. Ideally, you should put off eating until your stomach is growling and it's difficult to concentrate.  Use it to lose  it. When you feel the urge to eat, use the HALT method. Ask yourself, Am I really hungry? Or am I angry or anxious, lonely or bored, or tired? If you're still not certain, try the apple test. If you're truly hungry, an apple should seem delicious; if it doesn't, something else is going on. Or you can try drinking water and making yourself busy, if you are still hungry try a healthy snack.  4. Not All Calories Are Created Equal The mechanics of weight loss are pretty simple: Take in fewer calories than you use for energy. But the kind of food you eat makes all the difference. Processed food that's high in saturated fat and refined starch or sugar can cause inflammation that disrupts the hormone signals that tell your brain you're full. The result: You eat a lot more.  Use it to lose it. Clean up your diet. Swap in whole, unprocessed foods, including vegetables, lean protein, and healthy fats that will fill you up and give you the biggest nutritional bang for your calorie buck. In a few weeks, as your brain starts receiving regular hunger and fullness signals once again, you'll notice that you feel less hungry overall and naturally start cutting back on the amount you eat.  5. Protein, Produce, and Plant-Based Fats Are Your Weight-Loss Trinity Here's why eating the three Ps regularly will help you drop pounds. Protein fills you up. You need it to build lean muscle, which keeps your metabolism humming so that you can torch more fat. People in a weight-loss program who ate double the recommended daily allowance for protein (about 110 grams for a 150-pound woman) lost 70 percent of their weight from fat, while people who ate the RDA lost only about 40 percent, one study found. Produce is packed with filling fiber. "It's very difficult to consume too many calories if you're eating a lot of vegetables. Example: Three cups of broccoli is a lot of food, yet only 93 calories. (Fruit is another story. It can be easy to  overeat and can contain a lot of calories from sugar, so be sure to monitor your intake.) Plant-based fats like olive oil and those in avocados and nuts are healthy and extra satiating.  Use it to lose it. Aim to incorporate each of the three Ps into every meal and snack. People who eat protein throughout the day  are able to keep weight off, according to a study in the [place] of Clinical Nutrition. In addition to meat, poultry and seafood, good sources are beans, lentils, eggs, tofu, and yogurt. As for fat, keep portion sizes in check by measuring out salad dressing, oil, and nut butters (shoot for one to two tablespoons). Finally, eat veggies or a little fruit at every meal. People who did that consumed 308 fewer calories but didn't feel any hungrier than when they didn't eat more produce.  7. How You Eat Is As Important As What You Eat In order for your brain to register that you're full, you need to focus on what you're eating. Sit down whenever you eat, preferably at a table. Turn off the TV or computer, put down your phone, and look at your food. Smell it. Chew slowly, and don't put another bite on your fork until you swallow. When women ate lunch this attentively, they consumed 30 percent less when snacking later than those who listened to an audiobook at lunchtime, according to a study in the [place] of Nutrition. 8. Weighing Yourself Really Works The scale provides the best evidence about whether your efforts are paying off. Seeing the numbers tick up or down or stagnate is motivation to keep going-or to rethink your approach. A 2015 study at [facility] found that daily weigh-ins helped people lose more weight, keep it off, and maintain that loss, even after two years. Use it to lose it. Step on the scale at the same time every day for the best results. If your weight shoots up several pounds from one weigh-in to the next, don't freak out. Eating a lot of salt the night  before or having your period is the likely culprit. The number should return to normal in a day or two. It's a steady climb that you need to do something about. 9. Too Much Stress and Too Little Sleep Are Your Enemies When you're tired and frazzled, your body cranks up the production of cortisol, the stress hormone that can cause carb cravings. Not getting enough sleep also boosts your levels of ghrelin, a hormone associated with hunger, while suppressing leptin, a hormone that signals fullness and satiety. People on a diet who slept only five and a half hours a night for two weeks lost 55 percent less fat and were hungrier than those who slept eight and a half hours, according to a study in the [place]. Use it to lose it. Prioritize sleep, aiming for seven hours or more a night, which research shows helps lower stress. And make sure you're getting quality zzz's. If a snoring spouse or a fidgety cat wakes you up frequently throughout the night, you may end up getting the equivalent of just four hours of sleep, according to a study from [facility]. Keep pets out of the bedroom, and use a white-noise app to drown out snoring. 10. You Will Hit a plateau-And You Can Bust Through It As you slim down, your body releases much less leptin, the fullness hormone.  If you're not strength training, start right now. Building muscle can raise your metabolism to help you overcome a plateau. To keep your body challenged and burning calories, incorporate new moves and more intense intervals into your workouts or add another sweat session to your weekly routine. Alternatively, cut an extra 100 calories or so a day from your diet. Now that you've lost weight, your body simply doesn't need as  much fuel.   Ways to cut 100 calories  1. Eat your eggs with hot sauce OR salsa instead of cheese.  Eggs are great for breakfast, but many people consider eggs and cheese to be BFFs. Instead of  cheese-1 oz. of cheddar has 114 calories-top your eggs with hot sauce, which contains no calories and helps with satiety and metabolism. Salsa is also a great option!!  2. Top your toast, waffles or pancakes with mashed berries instead of jelly or syrup. Half a cup of berries-fresh, frozen or thawed-has about 40 calories, compared with 2 tbsp. of maple syrup or jelly, which both have about 100 calories. The berries will also give you a good punch of fiber, which helps keep you full and satisfied and won't spike blood sugar quickly like the jelly or syrup. 3. Swap the non-fat latte for black coffee with a splash of half-and-half. Contrary to its name, that non-fat latte has 130 calories and a startling 19g of carbohydrates per 16 oz. serving. Replacing that 'light' drinkable dessert with a black coffee with a splash of half-and-half saves you more than 100 calories per 16 oz. serving. 4. Sprinkle salads with freeze-dried raspberries instead of dried cranberries. If you want a sweet addition to your nutritious salad, stay away from dried cranberries. They have a whopping 130 calories per  cup and 30g carbohydrates. Instead, sprinkle freeze-dried raspberries guilt-free and save more than 100 calories per  cup serving, adding 3g of belly-filling fiber. 5. Go for mustard in place of mayo on your sandwich. Mustard can add really nice flavor to any sandwich, and there are tons of varieties, from spicy to honey. A serving of mayo is 95 calories, versus 10 calories in a serving of mustard. 6. Choose a DIY salad dressing instead of the store-bought kind. Mix Dijon or whole grain mustard with low-fat Kefir or red wine vinegar and garlic. 7. Use hummus as a spread instead of a dip. Use hummus as a spread on a high-fiber cracker or tortilla with a sandwich and save on calories without sacrificing taste. 8. Pick just one salad "accessory." Salad isn't automatically a calorie winner. It's easy to over-accessorize  with toppings. Instead of topping your salad with nuts, avocado and cranberries (all three will clock in at 313 calories), just pick one. The next day, choose a different accessory, which will also keep your salad interesting. You don't wear all your jewelry every day, right? 9. Ditch the white pasta in favor of spaghetti squash. One cup of cooked spaghetti squash has about 40 calories, compared with traditional spaghetti, which comes with more than 200. Spaghetti squash is also nutrient-dense. It's a good source of fiber and Vitamins A and C, and it can be eaten just like you would eat pasta-with a great tomato sauce and [place] meatballs or with pesto, tofu and spinach, for example. 10. Dress up your chili, soups and stews with non-fat [place] yogurt instead of sour cream. Just a 'dollop' of sour cream can set you back 115 calories and a whopping 12g of fat-seven of which are of the artery-clogging variety. Added bonus: [place] yogurt is packed with muscle-building protein, calcium and B Vitamins. 11. Mash cauliflower instead of mashed potatoes. One cup of traditional mashed potatoes-in all their creamy goodness-has more than 200 calories, compared to mashed cauliflower, which you can typically eat for less than 100 calories per 1 cup serving. Cauliflower is a great source of the antioxidant indole-3-carbinol (I3C), which may help reduce the risk  of some cancers, like breast cancer. 12. Ditch the ice cream sundae in favor of a [place] yogurt parfait. Instead of a cup of ice cream or fro-yo for dessert, try 1 cup of nonfat Greek yogurt topped with fresh berries and a sprinkle of cacao nibs. Both toppings are packed with antioxidants, which can help reduce cellular inflammation and oxidative damage. And the comparison is a no-brainer: One cup of ice cream has about 275 calories; one cup of frozen yogurt has about 230; and a cup of Greek yogurt has just 130, plus twice the protein, so you're less likely to return to  the freezer for a second helping. 13. Put olive oil in a spray container instead of using it directly from the bottle. Each tablespoon of olive oil is 120 calories and 15g of fat. Use a mister instead of pouring it straight into the pan or onto a salad. This allows for portion control and will save you more than 100 calories. 14. When baking, substitute canned pumpkin for butter or oil. Canned pumpkin-not pumpkin pie mix-is loaded with Vitamin A, which is important for skin and eye health, as well as immunity. And the comparisons are pretty crazy:  cup of canned pumpkin has about 40 calories, compared to butter or oil, which has more than 800 calories. Yes, 800 calories. Applesauce and mashed banana can also serve as good substitutions for butter or oil, usually in a 1:1 ratio. 15. Top casseroles with high-fiber cereal instead of breadcrumbs. Breadcrumbs are typically made with white bread, while breakfast cereals contain 5-9g of fiber per serving. Not only will you save more than 150 calories per  cup serving, the swap will also keep you more full and you'll get a metabolism boost from the added fiber. 16. Snack on pistachios instead of macadamia nuts. Believe it or not, you get the same amount of calories from 35 pistachios (100 calories) as you would from only five macadamia nuts. 17. Chow down on kale chips rather than potato chips. This is my favorite 'don't knock it 'till you try it' swap. Kale chips are so easy to make at home, and you can spice them up with a little grated parmesan or chili powder. Plus, they're a mere fraction of the calories of potato chips, but with the same crunch factor we crave so often. 18. Add seltzer and some fruit slices to your cocktail instead of soda or fruit juice. One cup of soda or fruit juice can pack on as much as 140 calories. Instead, use seltzer and fruit slices. The fruit provides valuable phytochemicals, such as flavonoids and anthocyanins, which help to  combat cancer and stave off the aging process.  We want weight loss that will last so you should lose 1-2 pounds a week.  THAT IS IT! Please pick THREE things a month to change. Once it is a habit check off the item. Then pick another three items off the list to become habits.  If you are already doing a habit on the list GREAT!  Cross that item off! o Don't drink your calories. Ie, alcohol, soda, fruit juice, and sweet tea.  o Drink more water. Drink a glass when you feel hungry or before each meal.  o Eat breakfast - Complex carb and protein (likeDannon light and fit yogurt, oatmeal, fruit, eggs, [place] bacon). o Measure your cereal.  Eat no more than one cup a day. (ie [place]) o Eat an apple a day. o Add a vegetable a day.  o Try a new vegetable a month. o Use Pam! Stop using oil or butter to cook. o Don't finish your plate or use smaller plates. o Share your dessert. o Eat sugar free Jello for dessert or frozen grapes. o Don't eat 2-3 hours before bed. o Switch to whole wheat bread, pasta, and brown rice. o Make healthier choices when you eat out. No fries! o Pick baked chicken, NOT fried. o Don't forget to SLOW DOWN when you eat. It is not going anywhere.  o Take the stairs. o Park far away in the parking lot o [employer] (or weights) for 10 minutes while watching TV. o Walk at work for 10 minutes during break. o Walk outside 1 time a week with your friend, kids, dog, or significant other. o Start a walking group at [place] the mall as much as you can tolerate.  o Keep a food diary. o Weigh yourself daily. o Walk for 15 minutes 3 days per week. o Cook at home more often and eat out less.  If life happens and you go back to old habits, it is okay.  Just start over. You can do it!   If you experience chest pain, get short of breath, or tired during the exercise, please stop immediately and inform your doctor.

## 2015-01-24 NOTE — Progress Notes (Signed)
MEDICARE ANNUAL WELLNESS VISIT AND FOLLOW UP Assessment:   1. Essential hypertension - continue medications, DASH diet, exercise and monitor at home. Call if greater than 130/80.  - CBC with Differential/Platelet - BASIC METABOLIC PANEL WITH GFR - Hepatic function panel - TSH  2. 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 Continue weight loss - Hemoglobin A1c - Insulin, fasting - HM DIABETES FOOT EXAM  3. History of colonic polyps Given information about cologuard, declines colonoscopy at this time.   4. Hyperlipidemia -continue medications, check lipids, decrease fatty foods, increase activity. - Lipid panel  5. Vitamin D deficiency - Vit D  25 hydroxy (rtn osteoporosis monitoring)  6. Medication management - Magnesium  7. Morbid obesity (BMI 36.5) Obesity with co morbidities- long discussion about weight loss, diet, and exercise  8. Non compliance with medical treatment Has improved, will continue to monitor Long discussion about death/dying- will try to be more compliant with meds/diet  9. At low risk for fall  10. Depression screen negative  11. Shortness of breath Can be from deconditioning/obestiy, lungs CTAB- long discussion about his DM and that he is at much greater risk for silent MI due to this, no CP, dizziness, diaphoresis- declines referral for stress test/cardio- LONG discussion risk of MI/death- if symptoms worsen or do not go away will go to ER. Continue bASA  Over 30 minutes of exam, counseling, chart review, and critical decision making was performed  Plan:   During the course of the visit the patient was educated and counseled about appropriate screening and preventive services including:    Pneumococcal vaccine   Influenza  vaccine  Td vaccine  Screening electrocardiogram  Colorectal cancer screening  Diabetes screening  Glaucoma screening  Nutrition counseling   Conditions/risks identified: BMI: Discussed weight loss, diet, and increase physical activity.  Increase physical activity: AHA recommends 150 minutes of physical activity a week.  Medications reviewed Diabetes is not at goal, ACE/ARB therapy: Yes. Urinary Incontinence is not an issue: discussed non pharmacology and pharmacology options.  Fall risk: low- discussed PT, home fall assessment, medications.    Subjective:  Edwin Osborne is a 80 y.o. male who presents for Medicare Annual Wellness Visit and 3 month follow up for HTN, hyperlipidemia, diabetes, and vitamin D Def.  Date of last medicare wellness visit was 01/09/2014  His blood pressure has been controlled at home, today their BP is BP: 132/70 mmHg He does not workout. He denies chest pain, dizziness. Does have some SOB and fatigue with exercise/walking, states recovers quickly. No CP, diaphoresis, dizziness, nausea with it.  He is on cholesterol medication, he is off fenofibrate due to increase cost, he is on fish oil and pravastatin 40 and denies myalgias. His cholesterol is not at goal. The cholesterol last visit was:   Lab Results  Component Value Date   CHOL 154 10/18/2014   HDL 35* 10/18/2014   LDLCALC NOT CALC 10/18/2014   TRIG 526* 10/18/2014   CHOLHDL 4.4 10/18/2014   He has been working on diet and exercise for Diabetes with diabetic chronic kidney disease, he is on bASA, he is on ACE/ARB, he does not check his sugars at home and denies paresthesia of the feet, polydipsia, polyuria and visual disturbances. Last A1C was:  Lab Results  Component Value Date   HGBA1C 8.8* 10/18/2014  Patient is  on Vitamin D supplement.   Lab Results  Component Value Date   VD25OH 38 10/18/2014   BMI is Body mass index is 35.82 kg/(m^2)., he is working on diet and exercise. Wt  Readings from Last 3 Encounters:  01/24/15 246 lb (111.585 kg)  10/18/14 253 lb (114.76 kg)  06/21/14 256 lb (116.121 kg)  He is on cardura for BPH.   Medication Review: Current Outpatient Prescriptions on File Prior to Visit  Medication Sig Dispense Refill  . aspirin 81 MG tablet Take 1 tablet (81 mg total) by mouth daily. Stop and restart on 09/04/11    . Cinnamon 500 MG TABS Take 1,000 mg by mouth.    . doxazosin (CARDURA) 8 MG tablet TAKE 1 TABLET BY MOUTH FOR BLOOD PRESSURE / PROSTATE 90 tablet 2  . glipiZIDE (GLUCOTROL) 5 MG tablet TAKE 1 TABLET TWICE A DAY 60 tablet 10  . glucose blood (FREESTYLE LITE) test strip Test sugars 2-3 times daily for fluctuating sugars. DX 250.40 100 each 12  . metFORMIN (GLUCOPHAGE-XR) 500 MG 24 hr tablet TAKE 2 TABLETS BY MOUTH TWICE A DAY 360 tablet 1  . Multiple Vitamins-Minerals (MULTIVITAMIN WITH MINERALS) tablet Take 1 tablet by mouth daily.      . Nutritional Supplements (VITAMIN D MAINTENANCE PO) Take 10,000 mg by mouth daily.      . pravastatin (PRAVACHOL) 40 MG tablet TAKE 1 TABLET BY MOUTH AT BEDTIME FOR CHOLESTEROL 90 tablet 1  . quinapril-hydrochlorothiazide (ACCURETIC) 20-25 MG per tablet Take 1 tablet by mouth daily. 90 tablet 1  . Specialty Vitamins Products (MAGNESIUM, AMINO ACID CHELATE,) 133 MG tablet Take 1 tablet by mouth daily.       No current facility-administered medications on file prior to visit.    Current Problems (verified) Patient Active Problem List   Diagnosis Date Noted  . 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 Medication management 03/07/2014  . Non compliance with medical treatment 03/07/2014  . Morbid obesity (BMI 36.5) 03/07/2014  . Hyperlipidemia 08/15/2013  . Vitamin D deficiency 08/15/2013  . Hypertension   . History of colonic polyps 04/09/2004    Screening Tests Immunization History  Administered Date(s) Administered  . Influenza Whole 05/24/2011, 08/06/2012, 07/07/2013  . Influenza, High  Dose Seasonal PF 06/21/2014  . Pneumococcal Conjugate-13 06/21/2014  . Pneumococcal Polysaccharide-23 03/10/2005   Preventative care: Last colonoscopy: 2012 due 2015  Prior vaccinations: TD or Tdap: declines  Influenza: 2015 Pneumococcal: 2006 Prevnar13: 2015 Shingles/Zostavax: declines  Names of Other Physician/Practitioners you currently use: 1. [facility] here for primary care 2. [place], eye doctor, last visit  3. none, dentist, last visit  Patient Care Team: Unk Pinto, MD as PCP - General (Internal Medicine)  Past Surgical History  Procedure Laterality Date  . Tonsillectomy and adenoidectomy    . Vein ligation and stripping      left  . Knee arthroscopy    . Colonoscopy  2005, 08/21/11    2005 tubulovillous adenoma and adenomas 2012 - 3 adenomas, largest 10 mm, diverticulosis   No family history on file. History  Substance Use Topics  . Smoking status: Former Smoker    Types: Cigars    Quit date: 09/23/1987  . Smokeless tobacco: Never Used  . Alcohol Use: Yes     Comment: rarely    MEDICARE WELLNESS OBJECTIVES: Tobacco use: He does not smoke.  Patient is a former smoker. Alcohol Current alcohol use: social drinker Caffeine Current caffeine use: coffee  2 /day Osteoporosis: dietary calcium and/or vitamin D deficiency, History of fracture in the past year: no Diet: getting better, has been trying to increase veggies, lose weight.  Physical activity: sendentary work Depression/mood screen:   Depression screen PHQ 2/9 01/24/2015  Decreased Interest 0  Down, Depressed, Hopeless 0  PHQ - 2 Score 0   Hearing: normal Visual acuity: impaired,  does not perform annual eye exam  ADLs:  In your present state of health, do you have any difficulty performing the following activities: 01/24/2015 10/18/2014  Hearing? N N  Vision? Y N  Difficulty concentrating or making decisions? N N  Walking or climbing stairs? Y N  Dressing  or bathing? N N  Doing errands, shopping? N N  Preparing Food and eating ? N -  Using the Toilet? N -  In the past six months, have you accidently leaked urine? N -  Do you have problems with loss of bowel control? N -  Managing your Medications? N -  Managing your Finances? N -  Housekeeping or managing your Housekeeping? N -    Fall risk: Low Risk Cognitive Testing  Alert? Yes  Normal Appearance?Yes  Oriented to person? Yes  Place? Yes   Time? Yes  Recall of three objects?  Yes  Can perform simple calculations? Yes  Displays appropriate judgment?Yes  Can read the correct time from a watch face?Yes  EOL planning: Does patient have an advance directive?: No Would patient like information on creating an advanced directive?: No - patient declined information    Objective:   Blood pressure 132/70, pulse 60, temperature 97.7 F (36.5 C), resp. rate 16, height 5' 9.5" (1.765 m), weight 246 lb (111.585 kg). Body mass index is 35.82 kg/(m^2).  General appearance: alert, no distress, WD/WN, male HEENT: normocephalic, sclerae anicteric, TMs pearly, nares patent, no discharge or erythema, pharynx normal Oral cavity: MMM, no lesions Neck: supple, no lymphadenopathy, no thyromegaly, no masses Heart: RRR, normal S1, S2, no murmurs Lungs: CTA bilaterally, no wheezes, rhonchi, or rales Abdomen: +bs, soft, obese, non tender, non distended, no masses, no hepatomegaly, no splenomegaly Musculoskeletal: nontender, no swelling, no obvious deformity Extremities: no edema, no cyanosis, no clubbing Pulses: 2+ symmetric, upper and lower extremities, normal cap refill Neurological: alert, oriented x 3, CN2-12 intact, strength normal upper extremities and lower extremities, sensation normal throughout, DTRs 2+ throughout, no cerebellar signs, gait normal Psychiatric: normal affect, behavior normal, pleasant   Medicare Attestation I have personally reviewed: The patient's medical and social  history Their use of alcohol, tobacco or illicit drugs Their current medications and supplements The patient's functional ability including ADLs,fall risks, home safety risks, cognitive, and hearing and visual impairment Diet and physical activities Evidence for depression or mood disorders  The patient's weight, height, BMI, and visual acuity have been recorded in the chart.  I have made referrals, counseling, and provided education to the patient based on review of the above and I have provided the patient with a written personalized care plan for preventive services.     Vicie Mutters, PA-C   01/24/2015

## 2015-01-25 LAB — BASIC METABOLIC PANEL WITH GFR
BUN: 15 mg/dL (ref 6–23)
CO2: 31 mEq/L (ref 19–32)
Calcium: 9.4 mg/dL (ref 8.4–10.5)
Chloride: 102 mEq/L (ref 96–112)
Creat: 1.34 mg/dL (ref 0.50–1.35)
GFR, Est African American: 59 mL/min — ABNORMAL LOW
GFR, Est Non African American: 51 mL/min — ABNORMAL LOW
Glucose, Bld: 151 mg/dL — ABNORMAL HIGH (ref 70–99)
Potassium: 4.9 mEq/L (ref 3.5–5.3)
Sodium: 140 mEq/L (ref 135–145)

## 2015-01-25 LAB — LIPID PANEL
Cholesterol: 132 mg/dL (ref 0–200)
HDL: 37 mg/dL — ABNORMAL LOW (ref >=40)
LDL Cholesterol: 49 mg/dL (ref 0–99)
Total CHOL/HDL Ratio: 3.6 Ratio
Triglycerides: 231 mg/dL — ABNORMAL HIGH (ref <150)
VLDL: 46 mg/dL — ABNORMAL HIGH (ref 0–40)

## 2015-01-25 LAB — HEPATIC FUNCTION PANEL
ALT: 25 U/L (ref 0–53)
AST: 25 U/L (ref 0–37)
Albumin: 4.3 g/dL (ref 3.5–5.2)
Alkaline Phosphatase: 50 U/L (ref 39–117)
Bilirubin, Direct: 0.1 mg/dL (ref 0.0–0.3)
Indirect Bilirubin: 0.3 mg/dL (ref 0.2–1.2)
Total Bilirubin: 0.4 mg/dL (ref 0.2–1.2)
Total Protein: 6.5 g/dL (ref 6.0–8.3)

## 2015-01-25 LAB — INSULIN, FASTING: Insulin fasting, serum: 17.2 u[IU]/mL (ref 2.0–19.6)

## 2015-01-25 LAB — MAGNESIUM: Magnesium: 1.6 mg/dL (ref 1.5–2.5)

## 2015-01-25 LAB — VITAMIN D 25 HYDROXY (VIT D DEFICIENCY, FRACTURES): Vit D, 25-Hydroxy: 55 ng/mL (ref 30–100)

## 2015-01-25 LAB — TSH: TSH: 1.532 u[IU]/mL (ref 0.350–4.500)

## 2015-02-08 ENCOUNTER — Other Ambulatory Visit: Payer: Self-pay | Admitting: Internal Medicine

## 2015-02-08 DIAGNOSIS — E1122 Type 2 diabetes mellitus with diabetic chronic kidney disease: Secondary | ICD-10-CM

## 2015-02-08 DIAGNOSIS — I150 Renovascular hypertension: Secondary | ICD-10-CM

## 2015-02-08 DIAGNOSIS — R609 Edema, unspecified: Secondary | ICD-10-CM

## 2015-02-08 MED ORDER — LISINOPRIL 40 MG PO TABS: ORAL_TABLET | ORAL | Status: DC

## 2015-02-08 MED ORDER — HYDROCHLOROTHIAZIDE 25 MG PO TABS: ORAL_TABLET | ORAL | Status: DC

## 2015-05-15 ENCOUNTER — Encounter: Payer: Self-pay | Admitting: Physician Assistant

## 2015-05-15 ENCOUNTER — Ambulatory Visit (INDEPENDENT_AMBULATORY_CARE_PROVIDER_SITE_OTHER): Payer: Medicare Other | Admitting: Physician Assistant

## 2015-05-15 VITALS — BP 140/72 | HR 72 | Temp 97.7°F | Resp 16 | Ht 69.5 in | Wt 250.4 lb

## 2015-05-15 DIAGNOSIS — R109 Unspecified abdominal pain: Secondary | ICD-10-CM | POA: Diagnosis not present

## 2015-05-15 DIAGNOSIS — K5900 Constipation, unspecified: Secondary | ICD-10-CM | POA: Diagnosis not present

## 2015-05-15 DIAGNOSIS — R1011 Right upper quadrant pain: Secondary | ICD-10-CM

## 2015-05-15 DIAGNOSIS — R350 Frequency of micturition: Secondary | ICD-10-CM | POA: Diagnosis not present

## 2015-05-15 LAB — HEPATIC FUNCTION PANEL
ALT: 26 U/L (ref 9–46)
AST: 30 U/L (ref 10–35)
Albumin: 4.5 g/dL (ref 3.6–5.1)
Alkaline Phosphatase: 50 U/L (ref 40–115)
Bilirubin, Direct: 0.1 mg/dL (ref <=0.2)
Indirect Bilirubin: 0.4 mg/dL (ref 0.2–1.2)
Total Bilirubin: 0.5 mg/dL (ref 0.2–1.2)
Total Protein: 6.8 g/dL (ref 6.1–8.1)

## 2015-05-15 LAB — BASIC METABOLIC PANEL WITH GFR
BUN: 26 mg/dL — ABNORMAL HIGH (ref 7–25)
CO2: 27 mmol/L (ref 20–31)
Calcium: 9.9 mg/dL (ref 8.6–10.3)
Chloride: 102 mmol/L (ref 98–110)
Creat: 1.49 mg/dL — ABNORMAL HIGH (ref 0.70–1.18)
GFR, Est African American: 51 mL/min — ABNORMAL LOW (ref >=60)
GFR, Est Non African American: 44 mL/min — ABNORMAL LOW (ref >=60)
Glucose, Bld: 190 mg/dL — ABNORMAL HIGH (ref 65–99)
Potassium: 4.8 mmol/L (ref 3.5–5.3)
Sodium: 139 mmol/L (ref 135–146)

## 2015-05-15 MED ORDER — CIPROFLOXACIN HCL 500 MG PO TABS: 500.0000 mg | ORAL_TABLET | Freq: Two times a day (BID) | ORAL | Status: AC

## 2015-05-15 MED ORDER — METRONIDAZOLE 500 MG PO TABS: 500.0000 mg | ORAL_TABLET | Freq: Three times a day (TID) | ORAL | Status: AC

## 2015-05-15 NOTE — Progress Notes (Signed)
Subjective:    Patient ID: Edwin Osborne, male    DOB: 1935-04-21, 80 y.o.   MRN: [id number]  HPI 80 y.o. WM with history of HTN, DM with CKD, chol, noncompliance, obesity, and diverticulitis presents with AB pain. He states he has had right flank/back pain radiating to across AB for 3 days. Intermittent, worse with movement, better with lying down. Has had increasing gas, increasing constipation, had BM last night, no dark/black/blood in stool. Denies fever, chills, decreased appetite, N/V, no urinary symptoms other than some hesitancy.   Blood pressure 140/72, pulse 72, temperature 97.7 F (36.5 C), resp. rate 16, height 5' 9.5" (1.765 m), weight 250 lb 6.4 oz (113.581 kg).   Past Medical History  Diagnosis Date  . Hyperlipidemia   . Hypertension   . Diabetes mellitus   . BPH (benign prostatic hyperplasia)   . Cataract    Current Outpatient Prescriptions on File Prior to Visit  Medication Sig Dispense Refill  . aspirin 81 MG tablet Take 1 tablet (81 mg total) by mouth daily. Stop and restart on 09/04/11    . Cinnamon 500 MG TABS Take 1,000 mg by mouth.    . doxazosin (CARDURA) 8 MG tablet TAKE 1 TABLET BY MOUTH FOR BLOOD PRESSURE / PROSTATE 90 tablet 2  . glipiZIDE (GLUCOTROL) 5 MG tablet TAKE 1 TABLET TWICE A DAY 60 tablet 10  . glucose blood (FREESTYLE LITE) test strip Test sugars 2-3 times daily for fluctuating sugars. DX 250.40 100 each 12  . hydrochlorothiazide (HYDRODIURIL) 25 MG tablet Take 1 tablet daily for BP & Fluid 90 tablet 1  . lisinopril (PRINIVIL,ZESTRIL) 40 MG tablet Take 1 tablet daily for BP & Diabetic Kidney Protection 90 tablet 1  . metFORMIN (GLUCOPHAGE-XR) 500 MG 24 hr tablet TAKE 2 TABLETS BY MOUTH TWICE A DAY 360 tablet 1  . Multiple Vitamins-Minerals (MULTIVITAMIN WITH MINERALS) tablet Take 1 tablet by mouth daily.      . Nutritional Supplements (VITAMIN D MAINTENANCE PO) Take 10,000 mg by mouth daily.      . pravastatin (PRAVACHOL) 40 MG tablet TAKE 1  TABLET BY MOUTH AT BEDTIME FOR CHOLESTEROL 90 tablet 1  . Specialty Vitamins Products (MAGNESIUM, AMINO ACID CHELATE,) 133 MG tablet Take 1 tablet by mouth daily.       No current facility-administered medications on file prior to visit.   Review of Systems  Constitutional: Positive for fatigue. Negative for fever, chills and appetite change.  HENT: Negative.   Respiratory: Negative.   Cardiovascular: Negative.   Gastrointestinal: Positive for abdominal pain, constipation and abdominal distention. Negative for nausea, vomiting, diarrhea, blood in stool, anal bleeding and rectal pain.  Genitourinary: Positive for flank pain. Negative for dysuria, urgency, frequency, hematuria, decreased urine volume, discharge, penile swelling, scrotal swelling, difficulty urinating, genital sores, penile pain and testicular pain.  Musculoskeletal: Positive for back pain. Negative for myalgias, joint swelling, arthralgias, gait problem, neck pain and neck stiffness.  Skin: Negative.   Neurological: Negative.       Objective:   Physical Exam  Constitutional: He is oriented to person, place, and time. He appears well-developed and well-nourished. No distress.  Neck: Normal range of motion. Neck supple.  Cardiovascular: Normal rate and regular rhythm.   Pulmonary/Chest: Effort normal and breath sounds normal. He has no wheezes.  Abdominal: Soft. Bowel sounds are normal. He exhibits mass (questionable swelling/mass on right upper AB/flank). There is tenderness (right upper and lower AB). There is no rebound and no guarding.  Musculoskeletal: Normal range of motion. He exhibits tenderness (right lower back/CVA).  + tenderness with twisting.   Lymphadenopathy:    He has no cervical adenopathy.  Neurological: He is alert and oriented to person, place, and time.  Skin: Skin is warm and dry. No rash noted.       Assessment & Plan:  Right upper quadrant pain/flank pain Questionable mass right flank, tender  without rebound tenderness States it feels like diverticulitis, will treat with cipro/flaygl Check labs including urine, rule out kidney stone, diverticulitis, mass, versus just muscular    Future Appointments Date Time Provider [place]  05/23/2015 2:30 PM Unk Pinto, MD [facility] None  11/05/2015 2:00 PM Unk Pinto, MD [facility] None

## 2015-05-15 NOTE — Patient Instructions (Signed)

## 2015-05-16 LAB — URINALYSIS, ROUTINE W REFLEX MICROSCOPIC
Bilirubin Urine: NEGATIVE
Hgb urine dipstick: NEGATIVE
Ketones, ur: NEGATIVE
Leukocytes, UA: NEGATIVE
Nitrite: NEGATIVE
Protein, ur: NEGATIVE
Specific Gravity, Urine: 1.02 (ref 1.001–1.035)
pH: 5 (ref 5.0–8.0)

## 2015-05-16 LAB — CBC WITH DIFFERENTIAL/PLATELET
Basophils Absolute: 0.1 10*3/uL (ref 0.0–0.1)
Basophils Relative: 1 % (ref 0–1)
Eosinophils Absolute: 0.2 10*3/uL (ref 0.0–0.7)
Eosinophils Relative: 3 % (ref 0–5)
HCT: 43.2 % (ref 39.0–52.0)
Hemoglobin: 14.6 g/dL (ref 13.0–17.0)
Lymphocytes Relative: 23 % (ref 12–46)
Lymphs Abs: 1.8 10*3/uL (ref 0.7–4.0)
MCH: 29.6 pg (ref 26.0–34.0)
MCHC: 33.8 g/dL (ref 30.0–36.0)
MCV: 87.6 fL (ref 78.0–100.0)
MPV: 10.1 fL (ref 8.6–12.4)
Monocytes Absolute: 0.7 10*3/uL (ref 0.1–1.0)
Monocytes Relative: 9 % (ref 3–12)
Neutro Abs: 4.9 10*3/uL (ref 1.7–7.7)
Neutrophils Relative %: 64 % (ref 43–77)
Platelets: 194 10*3/uL (ref 150–400)
RBC: 4.93 MIL/uL (ref 4.22–5.81)
RDW: 14.4 % (ref 11.5–15.5)
WBC: 7.7 10*3/uL (ref 4.0–10.5)

## 2015-05-17 LAB — URINE CULTURE: Colony Count: 3000

## 2015-05-21 ENCOUNTER — Ambulatory Visit (HOSPITAL_COMMUNITY)
Admission: RE | Admit: 2015-05-21 | Discharge: 2015-05-21 | Disposition: A | Discharge status: Home / Self Care | Payer: Medicare Other | Source: Ambulatory Visit | Attending: Physician Assistant | Admitting: Physician Assistant

## 2015-05-21 DIAGNOSIS — R932 Abnormal findings on diagnostic imaging of liver and biliary tract: Secondary | ICD-10-CM | POA: Insufficient documentation

## 2015-05-21 DIAGNOSIS — R109 Unspecified abdominal pain: Secondary | ICD-10-CM

## 2015-05-21 DIAGNOSIS — N281 Cyst of kidney, acquired: Secondary | ICD-10-CM | POA: Insufficient documentation

## 2015-05-21 DIAGNOSIS — K8020 Calculus of gallbladder without cholecystitis without obstruction: Secondary | ICD-10-CM | POA: Diagnosis not present

## 2015-05-21 DIAGNOSIS — R1011 Right upper quadrant pain: Secondary | ICD-10-CM | POA: Insufficient documentation

## 2015-05-23 ENCOUNTER — Encounter: Payer: Self-pay | Admitting: Internal Medicine

## 2015-05-23 ENCOUNTER — Ambulatory Visit: Payer: Self-pay | Admitting: Internal Medicine

## 2015-05-23 ENCOUNTER — Ambulatory Visit (INDEPENDENT_AMBULATORY_CARE_PROVIDER_SITE_OTHER): Payer: Medicare Other | Admitting: Internal Medicine

## 2015-05-23 VITALS — BP 126/62 | HR 78 | Temp 98.6°F | Resp 18 | Ht 69.5 in | Wt 252.0 lb

## 2015-05-23 DIAGNOSIS — I10 Essential (primary) hypertension: Secondary | ICD-10-CM

## 2015-05-23 DIAGNOSIS — E782 Mixed hyperlipidemia: Secondary | ICD-10-CM | POA: Diagnosis not present

## 2015-05-23 DIAGNOSIS — E559 Vitamin D deficiency, unspecified: Secondary | ICD-10-CM | POA: Diagnosis not present

## 2015-05-23 DIAGNOSIS — E1121 Type 2 diabetes mellitus with diabetic nephropathy: Secondary | ICD-10-CM

## 2015-05-23 DIAGNOSIS — Z79899 Other long term (current) drug therapy: Secondary | ICD-10-CM | POA: Diagnosis not present

## 2015-05-23 LAB — TSH: TSH: 2.294 u[IU]/mL (ref 0.350–4.500)

## 2015-05-23 LAB — BASIC METABOLIC PANEL WITH GFR
BUN: 18 mg/dL (ref 7–25)
CO2: 23 mmol/L (ref 20–31)
Calcium: 9.5 mg/dL (ref 8.6–10.3)
Chloride: 105 mmol/L (ref 98–110)
Creat: 1.36 mg/dL — ABNORMAL HIGH (ref 0.70–1.18)
GFR, Est African American: 57 mL/min — ABNORMAL LOW (ref >=60)
GFR, Est Non African American: 49 mL/min — ABNORMAL LOW (ref >=60)
Glucose, Bld: 203 mg/dL — ABNORMAL HIGH (ref 65–99)
Potassium: 4.6 mmol/L (ref 3.5–5.3)
Sodium: 140 mmol/L (ref 135–146)

## 2015-05-23 LAB — LIPID PANEL
Cholesterol: 146 mg/dL (ref 125–200)
HDL: 40 mg/dL (ref >=40)
LDL Cholesterol: 68 mg/dL (ref <130)
Total CHOL/HDL Ratio: 3.7 Ratio (ref <=5.0)
Triglycerides: 192 mg/dL — ABNORMAL HIGH (ref <150)
VLDL: 38 mg/dL — ABNORMAL HIGH (ref <30)

## 2015-05-23 LAB — HEPATIC FUNCTION PANEL
ALT: 22 U/L (ref 9–46)
AST: 19 U/L (ref 10–35)
Albumin: 4.4 g/dL (ref 3.6–5.1)
Alkaline Phosphatase: 63 U/L (ref 40–115)
Bilirubin, Direct: 0.1 mg/dL (ref <=0.2)
Indirect Bilirubin: 0.4 mg/dL (ref 0.2–1.2)
Total Bilirubin: 0.5 mg/dL (ref 0.2–1.2)
Total Protein: 6.4 g/dL (ref 6.1–8.1)

## 2015-05-23 LAB — CBC WITH DIFFERENTIAL/PLATELET
Basophils Absolute: 0.1 10*3/uL (ref 0.0–0.1)
Basophils Relative: 1 % (ref 0–1)
Eosinophils Absolute: 0.2 10*3/uL (ref 0.0–0.7)
Eosinophils Relative: 3 % (ref 0–5)
HCT: 41.1 % (ref 39.0–52.0)
Hemoglobin: 14 g/dL (ref 13.0–17.0)
Lymphocytes Relative: 23 % (ref 12–46)
Lymphs Abs: 1.4 10*3/uL (ref 0.7–4.0)
MCH: 29.5 pg (ref 26.0–34.0)
MCHC: 34.1 g/dL (ref 30.0–36.0)
MCV: 86.7 fL (ref 78.0–100.0)
MPV: 10.2 fL (ref 8.6–12.4)
Monocytes Absolute: 0.5 10*3/uL (ref 0.1–1.0)
Monocytes Relative: 8 % (ref 3–12)
Neutro Abs: 4 10*3/uL (ref 1.7–7.7)
Neutrophils Relative %: 65 % (ref 43–77)
Platelets: 175 10*3/uL (ref 150–400)
RBC: 4.74 MIL/uL (ref 4.22–5.81)
RDW: 14.4 % (ref 11.5–15.5)
WBC: 6.1 10*3/uL (ref 4.0–10.5)

## 2015-05-23 LAB — HEMOGLOBIN A1C
Hgb A1c MFr Bld: 8.9 % — ABNORMAL HIGH (ref <5.7)
Mean Plasma Glucose: 209 mg/dL — ABNORMAL HIGH (ref <117)

## 2015-05-23 LAB — MAGNESIUM: Magnesium: 1.7 mg/dL (ref 1.5–2.5)

## 2015-05-23 NOTE — Progress Notes (Signed)
Patient ID: Edwin Osborne, male   DOB: 1934-12-26, 80 y.o.   MRN: [id number]   This very nice 81 y.o. MWM presents for  follow up with Hypertension, Hyperlipidemia, Pre-Diabetes and Vitamin D Deficiency.    Patient is treated for HTN since 1988  & BP has been controlled at home. Today's BP: 126/62 mmHg. Patient has had no complaints of any cardiac type chest pain, palpitations, dyspnea/orthopnea/PND, dizziness, claudication, or dependent edema.   Hyperlipidemia is controlled with diet & meds. Patient denies myalgias or other med SE's. Last Lipids were at goal -  Cholesterol 132; HDL 37*; LDL 49; and with elevated Triglycerides 231 on 01/24/2015.   Also, the patient has history of Morbid Obesity (BMI 36.5) and consequent T2_NIDDM  (since 1999) w/ CKD 3 and he has had no symptoms of reactive hypoglycemia, diabetic polys, paresthesias or visual blurring. He does not monitor CBG's.  Last A1c was 8.5% on 01/24/2015 and representative of his poor dietary compliance.    Further, the patient also has history of Vitamin D Deficiency of 32 in 2008 and supplements vitamin D without any suspected side-effects. Last vitamin D was 55 on      01/24/2015.   Medication Sig  . aspirin 81 MG tablet Take 1 tablet (81 mg total) by mouth daily. Stop and restart on 09/04/11  . Cinnamon 500 MG TABS Take 1,000 mg by mouth.  . doxazosin 8 MG tablet TAKE 1 TABLET BY MOUTH FOR BLOOD PRESSURE / PROSTATE  . glipiZIDE 5 MG tablet TAKE 1 TABLET TWICE A DAY  . hctz 25 MG tablet Take 1 tablet daily for BP & Fluid  . lisinopril  40 MG tablet Take 1 tablet daily for BP & Diabetic Kidney Protection  . metFORMIN -XR 500 MG  TAKE 2 TABLETS BY MOUTH TWICE A DAY  . Multiple Vitamins-Minerals  Take 1 tablet by mouth daily.    Marland Kitchen VITAMIN D Take 10,000 mg by mouth daily.    . pravastatin  40 MG tablet TAKE 1 TABLET BY MOUTH AT BEDTIME FOR CHOLESTEROL  . MAGNESIUM, AMINO ACID CHELATE,) 133 MG Take 1 tablet by mouth daily.     No Known  Allergies  PMHx:   Past Medical History  Diagnosis Date  . Hyperlipidemia   . Hypertension   . Diabetes mellitus   . BPH (benign prostatic hyperplasia)   . Cataract    Immunization History  Administered Date(s) Administered  . Influenza Whole 05/24/2011, 08/06/2012, 07/07/2013  . Influenza, High Dose Seasonal PF 06/21/2014  . Pneumococcal Conjugate-13 06/21/2014  . Pneumococcal Polysaccharide-23 03/10/2005   Past Surgical History  Procedure Laterality Date  . Tonsillectomy and adenoidectomy    . Vein ligation and stripping      left  . Knee arthroscopy    . Colonoscopy  2005, 08/21/11    2005 tubulovillous adenoma and adenomas 2012 - 3 adenomas, largest 10 mm, diverticulosis   FHx:    Reviewed / unchanged  SHx:    Reviewed / unchanged  Systems Review:  Constitutional: Denies fever, chills, wt changes, headaches, insomnia, fatigue, night sweats, change in appetite. Eyes: Denies redness, blurred vision, diplopia, discharge, itchy, watery eyes.  ENT: Denies discharge, congestion, post nasal drip, epistaxis, sore throat, earache, hearing loss, dental pain, tinnitus, vertigo, sinus pain, snoring.  CV: Denies chest pain, palpitations, irregular heartbeat, syncope, dyspnea, diaphoresis, orthopnea, PND, claudication or edema. Respiratory: denies cough, dyspnea, DOE, pleurisy, hoarseness, laryngitis, wheezing.  Gastrointestinal: Denies dysphagia, odynophagia, heartburn, reflux, water brash,  abdominal pain or cramps, nausea, vomiting, bloating, diarrhea, constipation, hematemesis, melena, hematochezia  or hemorrhoids. Genitourinary: Denies dysuria, frequency, urgency, nocturia, hesitancy, discharge, hematuria or flank pain. Musculoskeletal: Denies arthralgias, myalgias, stiffness, jt. swelling, pain, limping or strain/sprain.  Skin: Denies pruritus, rash, hives, warts, acne, eczema or change in skin lesion(s). Neuro: No weakness, tremor, incoordination, spasms, paresthesia or  pain. Psychiatric: Denies confusion, memory loss or sensory loss. Endo: Denies change in weight, skin or hair change.  Heme/Lymph: No excessive bleeding, bruising or enlarged lymph nodes.  Physical Exam  BP 126/62 mmHg  Pulse 78  Temp(Src) 98.6 F (37 C) (Temporal)  Resp 18  Ht 5' 9.5" (1.765 m)  Wt 252 lb (114.306 kg)  BMI 36.69 kg/m2  Appears well nourished and in no distress. Eyes: PERRLA, EOMs, conjunctiva no swelling or erythema. Sinuses: No frontal/maxillary tenderness ENT/Mouth: EAC's clear, TM's nl w/o erythema, bulging. Nares clear w/o erythema, swelling, exudates. Oropharynx clear without erythema or exudates. Oral hygiene is good. Tongue normal, non obstructing. Hearing intact.  Neck: Supple. Thyroid nl. Car 2+/2+ without bruits, nodes or JVD. Chest: Respirations nl with BS clear & equal w/o rales, rhonchi, wheezing or stridor.  Cor: Heart sounds normal w/ regular rate and rhythm without sig. murmurs, gallops, clicks, or rubs. Peripheral pulses normal and equal  without edema.  Abdomen: Soft & bowel sounds normal. Rotund. Non-tender w/o guarding, rebound, hernias, masses, or organomegaly.  Lymphatics: Unremarkable.  Musculoskeletal: Full ROM all peripheral extremities, joint stability, 5/5 strength, and normal gait.  Skin: Warm, dry without exposed rashes, lesions or ecchymosis apparent.  Neuro: Cranial nerves intact, reflexes equal bilaterally. Sensory-motor testing grossly intact. Tendon reflexes grossly intact.  Pysch: Alert & oriented x 3.  Insight and judgement nl & appropriate. No ideations.  Assessment and Plan:    1. Essential hypertension  - TSH  2. Hyperlipidemia  - Lipid panel  3. 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 Vitamin D deficiency  - Vit D  25 hydroxy   5. Morbid obesity (BMI 36.5)   6. Medication management  - CBC with Differential/Platelet - BASIC METABOLIC PANEL WITH GFR - Hepatic function  panel - Magnesium  Recommended regular exercise, BP monitoring, weight control, and discussed med and SE's. Recommended labs to assess and monitor clinical status. Further disposition pending results of labs. Over 30 minutes of exam, counseling, chart review was performed

## 2015-05-23 NOTE — Patient Instructions (Signed)

## 2015-05-24 LAB — INSULIN, RANDOM: Insulin: 19.2 u[IU]/mL (ref 2.0–19.6)

## 2015-06-14 ENCOUNTER — Other Ambulatory Visit: Payer: Self-pay

## 2015-06-14 MED ORDER — GLIPIZIDE 5 MG PO TABS: 5.0000 mg | ORAL_TABLET | Freq: Two times a day (BID) | ORAL | Status: DC

## 2015-07-12 ENCOUNTER — Other Ambulatory Visit: Payer: Self-pay | Admitting: Internal Medicine

## 2015-08-19 ENCOUNTER — Other Ambulatory Visit: Payer: Self-pay | Admitting: Internal Medicine

## 2015-08-31 ENCOUNTER — Encounter: Payer: Self-pay | Admitting: Internal Medicine

## 2015-08-31 ENCOUNTER — Ambulatory Visit (INDEPENDENT_AMBULATORY_CARE_PROVIDER_SITE_OTHER): Payer: Medicare Other | Admitting: Internal Medicine

## 2015-08-31 VITALS — BP 126/68 | HR 66 | Temp 98.2°F | Resp 18 | Ht 69.5 in | Wt 246.0 lb

## 2015-08-31 DIAGNOSIS — Z79899 Other long term (current) drug therapy: Secondary | ICD-10-CM

## 2015-08-31 DIAGNOSIS — M545 Low back pain, unspecified: Secondary | ICD-10-CM

## 2015-08-31 DIAGNOSIS — E782 Mixed hyperlipidemia: Secondary | ICD-10-CM

## 2015-08-31 DIAGNOSIS — I10 Essential (primary) hypertension: Secondary | ICD-10-CM

## 2015-08-31 DIAGNOSIS — Z23 Encounter for immunization: Secondary | ICD-10-CM

## 2015-08-31 DIAGNOSIS — E1121 Type 2 diabetes mellitus with diabetic nephropathy: Secondary | ICD-10-CM

## 2015-08-31 DIAGNOSIS — E559 Vitamin D deficiency, unspecified: Secondary | ICD-10-CM | POA: Diagnosis not present

## 2015-08-31 LAB — CBC WITH DIFFERENTIAL/PLATELET
Basophils Absolute: 0.1 10*3/uL (ref 0.0–0.1)
Basophils Relative: 1 % (ref 0–1)
Eosinophils Absolute: 0.2 10*3/uL (ref 0.0–0.7)
Eosinophils Relative: 4 % (ref 0–5)
HCT: 41.8 % (ref 39.0–52.0)
Hemoglobin: 14.1 g/dL (ref 13.0–17.0)
Lymphocytes Relative: 24 % (ref 12–46)
Lymphs Abs: 1.4 10*3/uL (ref 0.7–4.0)
MCH: 29.9 pg (ref 26.0–34.0)
MCHC: 33.7 g/dL (ref 30.0–36.0)
MCV: 88.6 fL (ref 78.0–100.0)
MPV: 10.1 fL (ref 8.6–12.4)
Monocytes Absolute: 0.7 10*3/uL (ref 0.1–1.0)
Monocytes Relative: 11 % (ref 3–12)
Neutro Abs: 3.6 10*3/uL (ref 1.7–7.7)
Neutrophils Relative %: 60 % (ref 43–77)
Platelets: 168 10*3/uL (ref 150–400)
RBC: 4.72 MIL/uL (ref 4.22–5.81)
RDW: 13.7 % (ref 11.5–15.5)
WBC: 6 10*3/uL (ref 4.0–10.5)

## 2015-08-31 LAB — BASIC METABOLIC PANEL WITH GFR
BUN: 25 mg/dL (ref 7–25)
CO2: 25 mmol/L (ref 20–31)
Calcium: 9.2 mg/dL (ref 8.6–10.3)
Chloride: 101 mmol/L (ref 98–110)
Creat: 1.6 mg/dL — ABNORMAL HIGH (ref 0.70–1.18)
GFR, Est African American: 47 mL/min — ABNORMAL LOW (ref >=60)
GFR, Est Non African American: 41 mL/min — ABNORMAL LOW (ref >=60)
Glucose, Bld: 200 mg/dL — ABNORMAL HIGH (ref 65–99)
Potassium: 4.7 mmol/L (ref 3.5–5.3)
Sodium: 139 mmol/L (ref 135–146)

## 2015-08-31 LAB — HEMOGLOBIN A1C
Hgb A1c MFr Bld: 9.3 % — ABNORMAL HIGH (ref <5.7)
Mean Plasma Glucose: 220 mg/dL — ABNORMAL HIGH (ref <117)

## 2015-08-31 LAB — LIPID PANEL
Cholesterol: 136 mg/dL (ref 125–200)
HDL: 33 mg/dL — ABNORMAL LOW (ref >=40)
LDL Cholesterol: 42 mg/dL (ref <130)
Total CHOL/HDL Ratio: 4.1 Ratio (ref <=5.0)
Triglycerides: 306 mg/dL — ABNORMAL HIGH (ref <150)
VLDL: 61 mg/dL — ABNORMAL HIGH (ref <30)

## 2015-08-31 LAB — HEPATIC FUNCTION PANEL
ALT: 20 U/L (ref 9–46)
AST: 19 U/L (ref 10–35)
Albumin: 4.1 g/dL (ref 3.6–5.1)
Alkaline Phosphatase: 57 U/L (ref 40–115)
Bilirubin, Direct: 0.1 mg/dL (ref <=0.2)
Indirect Bilirubin: 0.5 mg/dL (ref 0.2–1.2)
Total Bilirubin: 0.6 mg/dL (ref 0.2–1.2)
Total Protein: 6.7 g/dL (ref 6.1–8.1)

## 2015-08-31 LAB — TSH: TSH: 2.821 u[IU]/mL (ref 0.350–4.500)

## 2015-08-31 MED ORDER — CYCLOBENZAPRINE HCL 10 MG PO TABS: 10.0000 mg | ORAL_TABLET | Freq: Three times a day (TID) | ORAL | Status: DC | PRN

## 2015-08-31 NOTE — Progress Notes (Signed)
Patient ID: KACYN BASTIEN, male   DOB: 1934/12/27, 80 y.o.   MRN: [id number]  Assessment and Plan:  Hypertension:  -Continue medication -monitor blood pressure at home. -Continue DASH diet -Reminder to go to the ER if any CP, SOB, nausea, dizziness, severe HA, changes vision/speech, left arm numbness and tingling and jaw pain.  Cholesterol - Continue diet and exercise -Check cholesterol.   Diabetes with diabetic chronic kidney disease -Continue diet and exercise.  -Check A1C  Vitamin D Def -check level -continue medications.   Back pain -flexeril -cont nsaids  Continue diet and meds as discussed. Further disposition pending results of labs. Discussed med's effects and SE's.    HPI 80 y.o. male  presents for 3 month follow up with hypertension, hyperlipidemia, diabetes and vitamin D deficiency.   His blood pressure has been controlled at home, today their BP is BP: 126/68 mmHg.He does not workout. He denies chest pain, shortness of breath, dizziness.   He is on cholesterol medication and denies myalgias. His cholesterol is at goal. The cholesterol was:  05/23/2015: Cholesterol 146; HDL 40; LDL Cholesterol 68; Triglycerides 192*   He has not been working on diet and exercise for diabetes with diabetic chronic kidney disease, he is on bASA, he is on ACE/ARB, and denies  foot ulcerations, hyperglycemia, hypoglycemia , increased appetite, nausea, paresthesia of the feet, polydipsia, polyuria, visual disturbances, vomiting and weight loss. Last A1C was: 05/23/2015: Hgb A1c MFr Bld 8.9*   Patient is on Vitamin D supplement. 01/24/2015: Vit D, 25-Hydroxy 55  Patient complains of intermittent low back pain with occasional muscle spasm.  He reports that the pain is more like spasm.  He reports that he has been taking ibuprofen and has also been using a a heating pad and that helps.  No numbness, no loss of bowel or bladder.  Current Medications:  Current Outpatient Prescriptions on File  Prior to Visit  Medication Sig Dispense Refill  . aspirin 81 MG tablet Take 1 tablet (81 mg total) by mouth daily. Stop and restart on 09/04/11    . Cinnamon 500 MG TABS Take 1,000 mg by mouth.    . doxazosin (CARDURA) 8 MG tablet TAKE 1 TABLET BY MOUTH FOR BLOOD PRESSURE / PROSTATE 90 tablet 2  . glipiZIDE (GLUCOTROL) 5 MG tablet Take 1 tablet (5 mg total) by mouth 2 (two) times daily. 180 tablet 3  . hydrochlorothiazide (HYDRODIURIL) 25 MG tablet TAKE 1 TABLET EVERY DAY FRO BLOOD PRESSURE AND FLUID 90 tablet 1  . lisinopril (PRINIVIL,ZESTRIL) 40 MG tablet TAKE 1 TABLET BY MOUTH DAILY FOR BLOOD PRESSURE AND DIABETIC KIDNEY PROTECTION 90 tablet 1  . metFORMIN (GLUCOPHAGE-XR) 500 MG 24 hr tablet TAKE 2 TABLETS BY MOUTH TWICE A DAY 360 tablet 1  . Multiple Vitamins-Minerals (MULTIVITAMIN WITH MINERALS) tablet Take 1 tablet by mouth daily.      . Nutritional Supplements (VITAMIN D MAINTENANCE PO) Take 10,000 mg by mouth daily.      . pravastatin (PRAVACHOL) 40 MG tablet TAKE 1 TABLET BY MOUTH AT BEDTIME FOR CHOLESTEROL 90 tablet 1  . Specialty Vitamins Products (MAGNESIUM, AMINO ACID CHELATE,) 133 MG tablet Take 1 tablet by mouth daily.       No current facility-administered medications on file prior to visit.   Medical History:  Past Medical History  Diagnosis Date  . Hyperlipidemia   . Hypertension   . Diabetes mellitus   . BPH (benign prostatic hyperplasia)   . Cataract    Allergies:  No Known Allergies   Review of Systems:  Review of Systems  Constitutional: Negative for fever, chills and malaise/fatigue.  HENT: Negative for congestion, ear pain and sore throat.   Eyes: Negative.   Respiratory: Negative for cough, shortness of breath and wheezing.   Cardiovascular: Negative for chest pain, palpitations and leg swelling.  Gastrointestinal: Negative for heartburn, abdominal pain, diarrhea, constipation, blood in stool and melena.  Genitourinary: Negative.   Musculoskeletal: Positive  for back pain.  Skin: Negative.   Neurological: Negative for dizziness, sensory change, loss of consciousness and headaches.  Psychiatric/Behavioral: Negative for depression. The patient is not nervous/anxious and does not have insomnia.     Family history- Review and unchanged  Social history- Review and unchanged  Physical Exam: BP 126/68 mmHg  Pulse 66  Temp(Src) 98.2 F (36.8 C) (Temporal)  Resp 18  Ht 5' 9.5" (1.765 m)  Wt 246 lb (111.585 kg)  BMI 35.82 kg/m2 Wt Readings from Last 3 Encounters:  08/31/15 246 lb (111.585 kg)  05/23/15 252 lb (114.306 kg)  05/15/15 250 lb 6.4 oz (113.581 kg)   General Appearance: Well nourished well developed, non-toxic appearing, in no apparent distress. Eyes: PERRLA, EOMs, conjunctiva no swelling or erythema ENT/Mouth: Ear canals clear with no erythema, swelling, or discharge.  TMs normal bilaterally, oropharynx clear, moist, with no exudate.   Neck: Supple, thyroid normal, no JVD, no cervical adenopathy.  Respiratory: Respiratory effort normal, breath sounds clear A&P, no wheeze, rhonchi or rales noted.  No retractions, no accessory muscle usage Cardio: RRR with no MRGs. No noted edema.  Abdomen: Soft, + BS.  Non tender, no guarding, rebound, hernias, masses. Musculoskeletal: Full ROM, 5/5 strength, Normal gait Skin: Warm, dry without rashes, lesions, ecchymosis.  Neuro: Awake and oriented X 3, Cranial nerves intact. No cerebellar symptoms.  Psych: normal affect, Insight and Judgment appropriate.    Starlyn Skeans, PA-C 9:05 AM [facility]

## 2015-08-31 NOTE — Addendum Note (Signed)
Addended by: Kennett Symes A on: 08/31/2015 09:35 AM   Modules accepted: Orders

## 2015-09-09 ENCOUNTER — Other Ambulatory Visit: Payer: Self-pay | Admitting: Internal Medicine

## 2015-10-17 ENCOUNTER — Ambulatory Visit (INDEPENDENT_AMBULATORY_CARE_PROVIDER_SITE_OTHER): Payer: Medicare Other | Admitting: Internal Medicine

## 2015-10-17 ENCOUNTER — Encounter: Payer: Self-pay | Admitting: Internal Medicine

## 2015-10-17 ENCOUNTER — Other Ambulatory Visit: Payer: Self-pay | Admitting: Internal Medicine

## 2015-10-17 ENCOUNTER — Telehealth: Payer: Self-pay | Admitting: Internal Medicine

## 2015-10-17 VITALS — BP 138/76 | HR 82 | Temp 98.2°F | Resp 18 | Ht 69.5 in | Wt 248.0 lb

## 2015-10-17 DIAGNOSIS — R1011 Right upper quadrant pain: Secondary | ICD-10-CM | POA: Diagnosis not present

## 2015-10-17 LAB — CBC WITH DIFFERENTIAL/PLATELET
Basophils Absolute: 0 10*3/uL (ref 0.0–0.1)
Basophils Relative: 0 % (ref 0–1)
Eosinophils Absolute: 0.2 10*3/uL (ref 0.0–0.7)
Eosinophils Relative: 2 % (ref 0–5)
HCT: 44.1 % (ref 39.0–52.0)
Hemoglobin: 14.7 g/dL (ref 13.0–17.0)
Lymphocytes Relative: 18 % (ref 12–46)
Lymphs Abs: 1.7 10*3/uL (ref 0.7–4.0)
MCH: 29.3 pg (ref 26.0–34.0)
MCHC: 33.3 g/dL (ref 30.0–36.0)
MCV: 88 fL (ref 78.0–100.0)
MPV: 10.2 fL (ref 8.6–12.4)
Monocytes Absolute: 1.1 10*3/uL — ABNORMAL HIGH (ref 0.1–1.0)
Monocytes Relative: 11 % (ref 3–12)
Neutro Abs: 6.7 10*3/uL (ref 1.7–7.7)
Neutrophils Relative %: 69 % (ref 43–77)
Platelets: 203 10*3/uL (ref 150–400)
RBC: 5.01 MIL/uL (ref 4.22–5.81)
RDW: 14 % (ref 11.5–15.5)
WBC: 9.7 10*3/uL (ref 4.0–10.5)

## 2015-10-17 LAB — HEPATIC FUNCTION PANEL
ALT: 30 U/L (ref 9–46)
AST: 31 U/L (ref 10–35)
Albumin: 3.9 g/dL (ref 3.6–5.1)
Alkaline Phosphatase: 65 U/L (ref 40–115)
Bilirubin, Direct: 0.1 mg/dL (ref <=0.2)
Indirect Bilirubin: 0.3 mg/dL (ref 0.2–1.2)
Total Bilirubin: 0.4 mg/dL (ref 0.2–1.2)
Total Protein: 6.5 g/dL (ref 6.1–8.1)

## 2015-10-17 LAB — BASIC METABOLIC PANEL WITH GFR
BUN: 33 mg/dL — ABNORMAL HIGH (ref 7–25)
CO2: 27 mmol/L (ref 20–31)
Calcium: 9.3 mg/dL (ref 8.6–10.3)
Chloride: 100 mmol/L (ref 98–110)
Creat: 1.57 mg/dL — ABNORMAL HIGH (ref 0.70–1.18)
GFR, Est African American: 48 mL/min — ABNORMAL LOW (ref >=60)
GFR, Est Non African American: 42 mL/min — ABNORMAL LOW (ref >=60)
Glucose, Bld: 152 mg/dL — ABNORMAL HIGH (ref 65–99)
Potassium: 4.3 mmol/L (ref 3.5–5.3)
Sodium: 137 mmol/L (ref 135–146)

## 2015-10-17 MED ORDER — OMEPRAZOLE 40 MG PO CPDR: 40.0000 mg | DELAYED_RELEASE_CAPSULE | Freq: Every day | ORAL | Status: DC

## 2015-10-17 MED ORDER — SUCRALFATE 1 G PO TABS: 1.0000 g | ORAL_TABLET | Freq: Four times a day (QID) | ORAL | Status: DC

## 2015-10-17 NOTE — Progress Notes (Signed)
   Subjective:    Patient ID: Edwin Osborne, male    DOB: Mar 03, 1935, 81 y.o.   MRN: [id number]  Flank Pain Associated symptoms include abdominal pain. Pertinent negatives include no dysuria or fever.   Patient presents to the office for evaluation of right upper quadrant and right flank pain.  He reports that the pain has been going on for the last 2-3 weeks.  He reports that the pain is like a knife sometimes.  It is nearly constant, but does tend to wax and wane.  It is sometimes worse after eating.  He reports that he has had a lot of gas lately.  He reports that he has also been belching a lot.  He did have an episode of vomiting.  He reports that he couldn't eat because the pain.  He does have a history of gallstones on an ultrasound in 04/2015.  He has never had kidney stones in the past.  He reports that he has had diverticulitis in the past.  He thinks that this could be similar.     Review of Systems  Constitutional: Positive for fatigue. Negative for fever and chills.  Gastrointestinal: Positive for vomiting, abdominal pain and constipation. Negative for nausea, diarrhea and blood in stool.  Genitourinary: Positive for flank pain. Negative for dysuria, urgency and hematuria.       Objective:   Physical Exam  Constitutional: He is oriented to person, place, and time. He appears well-developed and well-nourished. No distress.  HENT:  Head: Normocephalic.  Mouth/Throat: Oropharynx is clear and moist. No oropharyngeal exudate.  Eyes: Conjunctivae are normal. No scleral icterus.  Neck: Normal range of motion. Neck supple. No JVD present. No thyromegaly present.  Cardiovascular: Normal rate, regular rhythm, normal heart sounds and intact distal pulses.  Exam reveals no gallop and no friction rub.   No murmur heard. Pulmonary/Chest: Effort normal and breath sounds normal. No respiratory distress. He has no wheezes. He has no rales. He exhibits no tenderness.  Abdominal: Soft. Normal  appearance and bowel sounds are normal. He exhibits no distension and no mass. There is tenderness in the right upper quadrant and periumbilical area. There is no rigidity, no rebound, no guarding, no CVA tenderness, no tenderness at McBurney's point and negative Murphy's sign.  Musculoskeletal: Normal range of motion.  Lymphadenopathy:    He has no cervical adenopathy.  Neurological: He is alert and oriented to person, place, and time.  Skin: Skin is warm and dry. He is not diaphoretic.  Psychiatric: He has a normal mood and affect. His behavior is normal. Judgment and thought content normal.  Nursing note and vitals reviewed.   Filed Vitals:   10/17/15 1024  BP: 138/76  Pulse: 82  Temp: 98.2 F (36.8 C)  Resp: 18          Assessment & Plan:    1. RUQ pain He does have known cholelithiasis.  Doubt cholecystitis.  Dx could include nephrolithiasis.  No acute abdomen on exam.  May need referral based on CT results.   - CT Abdomen Pelvis Wo Contrast; Future - CBC with Differential/Platelet - BASIC METABOLIC PANEL WITH GFR - Hepatic function panel - Urinalysis, Routine w reflex microscopic (not at [facility]) - Culture, Urine

## 2015-10-17 NOTE — Telephone Encounter (Signed)
Called to give patient appointment information for CT. He advised that he was not feeling well, he was laying down after taking 3 teaspoons of OTC antacid liquid for burning in his chest and throat. Patient states that if he yawns, it feels like his adams apple will burst, feels sore.  If he leans over he is very sore throat and feels like acid is up high in his throat. Loma Sousa advised she would call in rx to [facility] at  [address] [facility] [address]. Advised patient if pain radiates into jaw or arm, go to the E.D.

## 2015-10-17 NOTE — Patient Instructions (Signed)
Cholelithiasis Cholelithiasis (also called gallstones) is a form of gallbladder disease. The gallbladder is a small organ that helps you digest fats. Symptoms of gallstones are:  Feeling sick to your stomach (nausea).  Throwing up (vomiting).  Belly pain.  Yellowing of the skin (jaundice).  Sudden pain. You may feel the pain for minutes to hours.  Fever.  Pain to the touch. HOME CARE  Only take medicines as told by your doctor.  Eat a low-fat diet until you see your doctor again. Eating fat can result in pain.  Follow up with your doctor as told. Attacks usually happen time after time. Surgery is usually needed for permanent treatment. GET HELP RIGHT AWAY IF:   Your pain gets worse.  Your pain is not helped by medicines.  You have a fever and lasting symptoms for more than 2-3 days.  You have a fever and your symptoms suddenly get worse.  You keep feeling sick to your stomach and throwing up. MAKE SURE YOU:   Understand these instructions.  Will watch your condition.  Will get help right away if you are not doing well or get worse.   This information is not intended to replace advice given to you by your health care provider. Make sure you discuss any questions you have with your health care provider.   Document Released: 02/25/2008 Document Revised: 05/11/2013 Document Reviewed: 03/02/2013 Elsevier Interactive Patient Education 2016 [employer].  Kidney Stones Kidney stones (urolithiasis) are deposits that form inside your kidneys. The intense pain is caused by the stone moving through the urinary tract. When the stone moves, the ureter goes into spasm around the stone. The stone is usually passed in the urine.  CAUSES   A disorder that makes certain neck glands produce too much parathyroid hormone (primary hyperparathyroidism).  A buildup of uric acid crystals, similar to gout in your joints.  Narrowing (stricture) of the ureter.  A kidney obstruction  present at birth (congenital obstruction).  Previous surgery on the kidney or ureters.  Numerous kidney infections. SYMPTOMS   Feeling sick to your stomach (nauseous).  Throwing up (vomiting).  Blood in the urine (hematuria).  Pain that usually spreads (radiates) to the groin.  Frequency or urgency of urination. DIAGNOSIS   Taking a history and physical exam.  Blood or urine tests.  CT scan.  Occasionally, an examination of the inside of the urinary bladder (cystoscopy) is performed. TREATMENT   Observation.  Increasing your fluid intake.  Extracorporeal shock wave lithotripsy--This is a noninvasive procedure that uses shock waves to break up kidney stones.  Surgery may be needed if you have severe pain or persistent obstruction. There are various surgical procedures. Most of the procedures are performed with the use of small instruments. Only small incisions are needed to accommodate these instruments, so recovery time is minimized. The size, location, and chemical composition are all important variables that will determine the proper choice of action for you. Talk to your health care provider to better understand your situation so that you will minimize the risk of injury to yourself and your kidney.  HOME CARE INSTRUCTIONS   Drink enough water and fluids to keep your urine clear or pale yellow. This will help you to pass the stone or stone fragments.  Strain all urine through the provided strainer. Keep all particulate matter and stones for your health care provider to see. The stone causing the pain may be as small as a grain of salt. It is very important  to use the strainer each and every time you pass your urine. The collection of your stone will allow your health care provider to analyze it and verify that a stone has actually passed. The stone analysis will often identify what you can do to reduce the incidence of recurrences.  Only take over-the-counter or  prescription medicines for pain, discomfort, or fever as directed by your health care provider.  Keep all follow-up visits as told by your health care provider. This is important.  Get follow-up X-rays if required. The absence of pain does not always mean that the stone has passed. It may have only stopped moving. If the urine remains completely obstructed, it can cause loss of kidney function or even complete destruction of the kidney. It is your responsibility to make sure X-rays and follow-ups are completed. Ultrasounds of the kidney can show blockages and the status of the kidney. Ultrasounds are not associated with any radiation and can be performed easily in a matter of minutes.  Make changes to your daily diet as told by your health care provider. You may be told to:  Limit the amount of salt that you eat.  Eat 5 or more servings of fruits and vegetables each day.  Limit the amount of meat, poultry, fish, and eggs that you eat.  Collect a 24-hour urine sample as told by your health care provider.You may need to collect another urine sample every 6-12 months. SEEK MEDICAL CARE IF:  You experience pain that is progressive and unresponsive to any pain medicine you have been prescribed. SEEK IMMEDIATE MEDICAL CARE IF:   Pain cannot be controlled with the prescribed medicine.  You have a fever or shaking chills.  The severity or intensity of pain increases over 18 hours and is not relieved by pain medicine.  You develop a new onset of abdominal pain.  You feel faint or pass out.  You are unable to urinate.   This information is not intended to replace advice given to you by your health care provider. Make sure you discuss any questions you have with your health care provider.   Document Released: 09/08/2005 Document Revised: 05/30/2015 Document Reviewed: 02/09/2013 Elsevier Interactive Patient Education [employer].

## 2015-10-18 LAB — URINALYSIS, ROUTINE W REFLEX MICROSCOPIC
Bilirubin Urine: NEGATIVE
Hgb urine dipstick: NEGATIVE
Ketones, ur: NEGATIVE
Leukocytes, UA: NEGATIVE
Nitrite: NEGATIVE
Protein, ur: NEGATIVE
Specific Gravity, Urine: 1.018 (ref 1.001–1.035)
pH: 5 (ref 5.0–8.0)

## 2015-10-18 LAB — URINE CULTURE: Colony Count: 2000

## 2015-10-18 LAB — URINALYSIS, MICROSCOPIC ONLY
Bacteria, UA: NONE SEEN [HPF]
Casts: NONE SEEN [LPF]
Crystals: NONE SEEN [HPF]
RBC / HPF: NONE SEEN RBC/HPF (ref <=2)
Squamous Epithelial / LPF: NONE SEEN [HPF] (ref <=5)
WBC, UA: NONE SEEN WBC/HPF (ref <=5)
Yeast: NONE SEEN [HPF]

## 2015-10-19 ENCOUNTER — Encounter (HOSPITAL_COMMUNITY): Payer: Self-pay

## 2015-10-19 ENCOUNTER — Ambulatory Visit (HOSPITAL_COMMUNITY)
Admission: RE | Admit: 2015-10-19 | Discharge: 2015-10-19 | Disposition: A | Discharge status: Home / Self Care | Payer: Medicare Other | Source: Ambulatory Visit | Attending: Internal Medicine | Admitting: Internal Medicine

## 2015-10-19 DIAGNOSIS — R1011 Right upper quadrant pain: Secondary | ICD-10-CM | POA: Diagnosis not present

## 2015-10-19 DIAGNOSIS — N400 Enlarged prostate without lower urinary tract symptoms: Secondary | ICD-10-CM | POA: Diagnosis not present

## 2015-10-19 DIAGNOSIS — K5730 Diverticulosis of large intestine without perforation or abscess without bleeding: Secondary | ICD-10-CM | POA: Diagnosis not present

## 2015-10-19 DIAGNOSIS — Z87442 Personal history of urinary calculi: Secondary | ICD-10-CM | POA: Diagnosis not present

## 2015-10-19 DIAGNOSIS — N281 Cyst of kidney, acquired: Secondary | ICD-10-CM | POA: Insufficient documentation

## 2015-10-19 DIAGNOSIS — K8020 Calculus of gallbladder without cholecystitis without obstruction: Secondary | ICD-10-CM | POA: Insufficient documentation

## 2015-10-20 ENCOUNTER — Other Ambulatory Visit: Payer: Self-pay | Admitting: Internal Medicine

## 2015-10-23 ENCOUNTER — Telehealth: Payer: Self-pay | Admitting: Internal Medicine

## 2015-10-23 DIAGNOSIS — K8020 Calculus of gallbladder without cholecystitis without obstruction: Secondary | ICD-10-CM

## 2015-10-23 NOTE — Telephone Encounter (Signed)
Patient's wife calling for explanation of pain.  I explained that the patient did indeed have gallstones but no kidney stones and this was the likely source of pain.  Will put in referral to see General Surgery to discuss elective surgery to have cholecystectomy.

## 2015-11-05 ENCOUNTER — Encounter: Payer: Self-pay | Admitting: Internal Medicine

## 2015-11-12 ENCOUNTER — Ambulatory Visit: Payer: Self-pay | Admitting: Surgery

## 2015-11-12 DIAGNOSIS — K8010 Calculus of gallbladder with chronic cholecystitis without obstruction: Secondary | ICD-10-CM | POA: Diagnosis not present

## 2015-11-12 NOTE — H&P (Signed)
Edwin Osborne 11/12/2015 9:23 AM Location: [facility] Patient #: [id number] DOB: [birth year] Married / Language: English / Race: White Male  History of Present Illness Marcello Moores A. Brenda Cowher MD; 11/12/2015 10:25 AM) Patient words: Pt sent at the request of [facility] PA for right upper quadrant abdominal pain on and off for 9 months. The pain comes and goes. Intermittent in nature made worse with fatty greasy foods. No radiation to his back or chest. Ultrasound and CT scan showed gallstones without inflammation. Pain is moderate and last minutes to hours at best. He does have nausea or heartburn associated with it.  The patient is a 81 year old male.   Other Problems Elbert Ewings, CMA; 11/12/2015 9:23 AM) Back Pain Diabetes Mellitus Diverticulosis Enlarged Prostate High blood pressure Hypercholesterolemia  Past Surgical History Elbert Ewings, CMA; 11/12/2015 9:23 AM) Colon Polyp Removal - Colonoscopy Knee Surgery Left.  Diagnostic Studies History Elbert Ewings, [place]; 11/12/2015 9:23 AM) Colonoscopy 5-10 years ago  Allergies Elbert Ewings, CMA; 11/12/2015 9:24 AM) No Known Drug Allergies 11/12/2015  Medication History Elbert Ewings, CMA; 11/12/2015 9:26 AM) Aspirin (81MG  Tablet, Oral) Active. Cinnamon (500MG  Tablet, Oral) Active. Flexeril (10MG  Tablet, Oral) Active. Doxazosin Mesylate (8MG  Tablet, Oral) Active. GlipiZIDE ER (5MG  Tablet ER 24HR, Oral) Active. HydroCHLOROthiazide (25MG  Tablet, Oral) Active. Lisinopril (40MG  Tablet, Oral) Active. Magnesium (110MG  Capsule, Oral) Active. MetFORMIN HCl ER (500MG  Tablet ER 24HR, Oral) Active. Multiple Vitamin (Oral) Active. Pravastatin Sodium (40MG  Tablet, Oral) Active. Vitamin D3 Active. Medications Reconciled  Social History Elbert Ewings, [place]; 11/12/2015 9:23 AM) Alcohol use Occasional alcohol use. Caffeine use Coffee, Tea. No drug use  Family History Elbert Ewings, [place]; 11/12/2015 9:23  AM) Diabetes Mellitus Brother. Hypertension Brother. Prostate Cancer Brother.     Review of Systems Elbert Ewings CMA; 11/12/2015 9:23 AM) General Present- Fatigue and Night Sweats. Not Present- Appetite Loss, Chills, Fever, Weight Gain and Weight Loss. Skin Not Present- Change in Wart/Mole, Dryness, Hives, Jaundice, New Lesions, Non-Healing Wounds, Rash and Ulcer. HEENT Present- Seasonal Allergies and Wears glasses/contact lenses. Not Present- Earache, Hearing Loss, Hoarseness, Nose Bleed, Oral Ulcers, Ringing in the Ears, Sinus Pain, Sore Throat, Visual Disturbances and Yellow Eyes. Respiratory Not Present- Bloody sputum, Chronic Cough, Difficulty Breathing, Snoring and Wheezing. Breast Not Present- Breast Mass, Breast Pain, Nipple Discharge and Skin Changes. Cardiovascular Present- Difficulty Breathing Lying Down and Swelling of Extremities. Not Present- Chest Pain, Leg Cramps, Palpitations, Rapid Heart Rate and Shortness of Breath. Gastrointestinal Present- Abdominal Pain and Bloating. Not Present- Bloody Stool, Change in Bowel Habits, Chronic diarrhea, Constipation, Difficulty Swallowing, Excessive gas, Gets full quickly at meals, Hemorrhoids, Indigestion, Nausea, Rectal Pain and Vomiting. Male Genitourinary Present- Frequency, Impotence, Nocturia, Urgency and Urine Leakage. Not Present- Blood in Urine, Change in Urinary Stream and Painful Urination. Musculoskeletal Present- Back Pain. Not Present- Joint Pain, Joint Stiffness, Muscle Pain, Muscle Weakness and Swelling of Extremities. Neurological Present- Fainting. Not Present- Decreased Memory, Headaches, Numbness, Seizures, Tingling, Tremor, Trouble walking and Weakness. Psychiatric Not Present- Anxiety, Bipolar, Change in Sleep Pattern, Depression, Fearful and Frequent crying. Endocrine Present- New Diabetes. Not Present- Cold Intolerance, Excessive Hunger, Hair Changes, Heat Intolerance and Hot flashes. Hematology Present- Easy  Bruising and Excessive bleeding. Not Present- Gland problems, HIV and Persistent Infections.  Vitals Elbert Ewings CMA; 11/12/2015 9:27 AM) 11/12/2015 9:26 AM Weight: 251 lb Height: 70in Body Surface Area: 2.3 m Body Mass Index: 36.01 kg/m  Temp.: 97.5F(Temporal)  Pulse: 78 (Regular)  BP: 138/84 (Sitting, Left Arm, Standard)  Physical Exam (Idalie Canto A. Nichlas Pitera MD; 11/12/2015 10:25 AM)  General Mental Status-Alert. General Appearance-Consistent with stated age. Hydration-Well hydrated. Voice-Normal.  Head and Neck Head-normocephalic, atraumatic with no lesions or palpable masses.  Eye Eyeball - Bilateral-Extraocular movements intact. Sclera/Conjunctiva - Bilateral-No scleral icterus.  Chest and Lung Exam Chest and lung exam reveals -quiet, even and easy respiratory effort with no use of accessory muscles and on auscultation, normal breath sounds, no adventitious sounds and normal vocal resonance. Inspection Chest Wall - Normal. Back - normal.  Cardiovascular Cardiovascular examination reveals -on palpation PMI is normal in location and amplitude, no palpable S3 or S4. Normal cardiac borders., normal heart sounds, regular rate and rhythm with no murmurs, carotid auscultation reveals no bruits and normal pedal pulses bilaterally.  Abdomen Inspection Inspection of the abdomen reveals - No Hernias. Skin - Scar - no surgical scars. Palpation/Percussion Palpation and Percussion of the abdomen reveal - Soft, Non Tender, No Rebound tenderness, No Rigidity (guarding) and No hepatosplenomegaly. Auscultation Auscultation of the abdomen reveals - Bowel sounds normal.  Neurologic Neurologic evaluation reveals -alert and oriented x 3 with no impairment of recent or remote memory. Mental Status-Normal.  Musculoskeletal Normal Exam - Left-Upper Extremity Strength Normal and Lower Extremity Strength Normal. Normal Exam - Right-Upper Extremity  Strength Normal, Lower Extremity Weakness.    Assessment & Plan (Alicia Seib A. Toya Palacios MD; 11/12/2015 10:25 AM)  CHRONIC CHOLECYSTITIS WITH CALCULUS (K80.10) Impression: Discussed medical and surgical options with the patient today. The pros and cons of each discussed. Pathophysiology of gallbladder disease discussed. He has opted for laparoscopic cholecystectomy with cholangiogram. The procedure has been discussed with the patient. Risks of laparoscopic cholecystectomy include bleeding, infection, bile duct injury, leak, death, open surgery, diarrhea, other surgery, organ injury, blood vessel injury, DVT, and additional care.  Current Plans You are being scheduled for surgery - Our schedulers will call you.  You should hear from our office's scheduling department within 5 working days about the location, date, and time of surgery. We try to make accommodations for patient's preferences in scheduling surgery, but sometimes the OR schedule or the surgeon's schedule prevents [place] from making those accommodations.  If you have not heard from our office [phone]) in 5 working days, call the office and ask for your surgeon's nurse.  If you have other questions about your diagnosis, plan, or surgery, call the office and ask for your surgeon's nurse.  Pt Education - Pamphlet Given - Laparoscopic Gallbladder Surgery: discussed with patient and provided information. The anatomy & physiology of hepatobiliary & pancreatic function was discussed. The pathophysiology of gallbladder dysfunction was discussed. Natural history risks without surgery was discussed. I feel the risks of no intervention will lead to serious problems that outweigh the operative risks; therefore, I recommended cholecystectomy to remove the pathology. I explained laparoscopic techniques with possible need for an open approach. Probable cholangiogram to evaluate the bilary tract was explained as well.  Risks such as bleeding, infection,  abscess, leak, injury to other organs, need for further treatment, heart attack, death, and other risks were discussed. I noted a good likelihood this will help address the problem. Possibility that this will not correct all abdominal symptoms was explained. Goals of post-operative recovery were discussed as well. We will work to minimize complications. An educational handout further explaining the pathology and treatment options was given as well. Questions were answered. The patient expresses understanding & wishes to proceed with surgery.  Pt Education - [facility] Laparosopic Post Op HCI (Gross) Pt  Education - Laparoscopic Cholecystectomy: gallbladder

## 2015-11-23 NOTE — Pre-Procedure Instructions (Signed)
Edwin Osborne  11/23/2015      [id number] - [place], [place] - [address] AT [facility] [address] [place] [address] Phone: [phone] Fax: [phone]  [id number] - [place], [place] - [address] [place] [address] [place] [address] Phone: [phone] Fax: [phone]    Your procedure is scheduled on Thursday, March 9th .  Report to [facility] Admitting at 5:30 AM             (Posted Surgery Time 7:30 - 9:00 am)   Call this number if you have problems the morning of surgery:  [phone]   Remember:  Do not eat food or drink liquids after midnight Wednesday.  Take these medicines the morning of surgery with A SIP OF WATER : Flexeril (if needed), Cardura, Omprazole.               4-5 days prior to surgery, STOP taking any herbal supplements, vitamins, anti-inflammatories.               You will NOT be taking any diabetic medications the MORNING of surgery.   Do not wear jewelry - no rings or watches.  Do not wear lotions or colognes.     You may NOT wear deodorant the day of surgery.              Men may shave face and neck.  Do not bring valuables to the hospital.  [facility] is not responsible for any belongings or valuables.  Contacts, dentures or bridgework may not be worn into surgery.  Leave your suitcase in the car.  After surgery it may be brought to your room. Patients discharged the day of surgery will not be allowed to drive home.   Name and phone number of your driver:     Please read over the following fact sheets that you were given. Pain Booklet and Surgical Site Infection Prevention

## 2015-11-26 ENCOUNTER — Encounter (HOSPITAL_COMMUNITY)
Admission: RE | Admit: 2015-11-26 | Discharge: 2015-11-26 | Disposition: A | Discharge status: Home / Self Care | Payer: Medicare Other | Source: Ambulatory Visit | Attending: Surgery | Admitting: Surgery

## 2015-11-26 ENCOUNTER — Encounter (HOSPITAL_COMMUNITY): Payer: Self-pay

## 2015-11-26 DIAGNOSIS — E1122 Type 2 diabetes mellitus with diabetic chronic kidney disease: Secondary | ICD-10-CM | POA: Diagnosis not present

## 2015-11-26 DIAGNOSIS — K8010 Calculus of gallbladder with chronic cholecystitis without obstruction: Secondary | ICD-10-CM | POA: Diagnosis not present

## 2015-11-26 DIAGNOSIS — E785 Hyperlipidemia, unspecified: Secondary | ICD-10-CM | POA: Diagnosis not present

## 2015-11-26 DIAGNOSIS — I129 Hypertensive chronic kidney disease with stage 1 through stage 4 chronic kidney disease, or unspecified chronic kidney disease: Secondary | ICD-10-CM | POA: Diagnosis not present

## 2015-11-26 DIAGNOSIS — N183 Chronic kidney disease, stage 3 (moderate): Secondary | ICD-10-CM | POA: Diagnosis not present

## 2015-11-26 DIAGNOSIS — Z87891 Personal history of nicotine dependence: Secondary | ICD-10-CM | POA: Diagnosis not present

## 2015-11-26 HISTORY — DX: Unspecified osteoarthritis, unspecified site: M19.90

## 2015-11-26 HISTORY — DX: Depression, unspecified: F32.A

## 2015-11-26 HISTORY — DX: Chronic kidney disease, stage 3 (moderate): N18.3

## 2015-11-26 HISTORY — DX: Gastro-esophageal reflux disease without esophagitis: K21.9

## 2015-11-26 HISTORY — DX: Major depressive disorder, single episode, unspecified: F32.9

## 2015-11-26 HISTORY — DX: Chronic kidney disease, stage 3 unspecified: N18.30

## 2015-11-26 LAB — COMPREHENSIVE METABOLIC PANEL
ALT: 25 U/L (ref 17–63)
AST: 25 U/L (ref 15–41)
Albumin: 4 g/dL (ref 3.5–5.0)
Alkaline Phosphatase: 49 U/L (ref 38–126)
Anion gap: 12 (ref 5–15)
BUN: 24 mg/dL — ABNORMAL HIGH (ref 6–20)
CO2: 22 mmol/L (ref 22–32)
Calcium: 9.4 mg/dL (ref 8.9–10.3)
Chloride: 106 mmol/L (ref 101–111)
Creatinine, Ser: 1.67 mg/dL — ABNORMAL HIGH (ref 0.61–1.24)
GFR calc Af Amer: 44 mL/min — ABNORMAL LOW (ref >60)
GFR calc non Af Amer: 38 mL/min — ABNORMAL LOW (ref >60)
Glucose, Bld: 158 mg/dL — ABNORMAL HIGH (ref 65–99)
Potassium: 4.8 mmol/L (ref 3.5–5.1)
Sodium: 140 mmol/L (ref 135–145)
Total Bilirubin: 0.4 mg/dL (ref 0.3–1.2)
Total Protein: 6.4 g/dL — ABNORMAL LOW (ref 6.5–8.1)

## 2015-11-26 LAB — CBC WITH DIFFERENTIAL/PLATELET
Basophils Absolute: 0 10*3/uL (ref 0.0–0.1)
Basophils Relative: 1 %
Eosinophils Absolute: 0.3 10*3/uL (ref 0.0–0.7)
Eosinophils Relative: 4 %
HCT: 41.2 % (ref 39.0–52.0)
Hemoglobin: 14 g/dL (ref 13.0–17.0)
Lymphocytes Relative: 21 %
Lymphs Abs: 1.7 10*3/uL (ref 0.7–4.0)
MCH: 29.4 pg (ref 26.0–34.0)
MCHC: 34 g/dL (ref 30.0–36.0)
MCV: 86.4 fL (ref 78.0–100.0)
Monocytes Absolute: 0.7 10*3/uL (ref 0.1–1.0)
Monocytes Relative: 9 %
Neutro Abs: 5.3 10*3/uL (ref 1.7–7.7)
Neutrophils Relative %: 65 %
Platelets: 171 10*3/uL (ref 150–400)
RBC: 4.77 MIL/uL (ref 4.22–5.81)
RDW: 13.3 % (ref 11.5–15.5)
WBC: 8.1 10*3/uL (ref 4.0–10.5)

## 2015-11-26 LAB — GLUCOSE, CAPILLARY: Glucose-Capillary: 142 mg/dL — ABNORMAL HIGH (ref 65–99)

## 2015-11-26 NOTE — Progress Notes (Addendum)
PCP is Dr. Melford Aase.  LOV  09/2014  Doing a HGb A1C today..  Patient does not check his sugar daily.  He says he has machine, but no strips and he gets it checked at annual physical.  This mornings blood sugar was 142.  Only takes pills. Denies any cardiac issues, never has had any cardiac tests nor has he seen a cardio.

## 2015-11-26 NOTE — Progress Notes (Signed)
   11/26/15 0816  OBSTRUCTIVE SLEEP APNEA  Have you ever been diagnosed with sleep apnea through a sleep study? No  Do you snore loudly (loud enough to be heard through closed doors)?  0  Do you often feel tired, fatigued, or sleepy during the daytime (such as falling asleep during driving or talking to someone)? 1  Has anyone observed you stop breathing during your sleep? 0  Do you have, or are you being treated for high blood pressure? 1  BMI more than 35 kg/m2? 1  Age > 50 (1-yes) 1  Neck circumference greater than:Male 16 inches or larger, Male 17inches or larger? 1  Male Gender (Yes=1) 1  Obstructive Sleep Apnea Score 6  Score 5 or greater  Results sent to PCP

## 2015-11-27 ENCOUNTER — Encounter (HOSPITAL_COMMUNITY): Payer: Self-pay

## 2015-11-27 LAB — HEMOGLOBIN A1C
Hgb A1c MFr Bld: 8 % — ABNORMAL HIGH (ref 4.8–5.6)
Mean Plasma Glucose: 183 mg/dL

## 2015-11-27 NOTE — Progress Notes (Signed)
Anesthesia Chart Review:  Pt is a 81 year old male scheduled for laparoscopic cholecystectomy with intraoperative cholangiogram on 11/29/2015 with Dr. Brantley Stage.   PCP is Dr. Unk Pinto.   PMH includes:  HTN, DM, hyperlipidemia, CKD (stage III), GERD. Former smoker. BMI 36.   Medications include: ASA, doxazosin, glipizide, hctz, lisinopril, metformin, prilosec, pravastatin.   Preoperative labs reviewed.   - HgbA1c 8.0, glucose 158 - Cr 1.67, BUN 24, consistent with prior results.  EKG 11/26/15: Sinus rhythm with sinus arrhythmia with 1st degree A-V block with occasional PVCs. RBBB.   If no changes, I anticipate pt can proceed with surgery as scheduled.   Willeen Cass, FNP-BC [facility]/Anesthesiology Phone: [phone] 11/27/2015 1:21 PM

## 2015-11-28 MED ORDER — DEXTROSE 5 % IV SOLN
3.0000 g | INTRAVENOUS | Status: AC
  Administered 2015-11-29: 3 g via INTRAVENOUS
  Filled 2015-11-28 (×3): qty 3000

## 2015-11-28 NOTE — Anesthesia Preprocedure Evaluation (Addendum)
Anesthesia Evaluation  Patient identified by MRN, date of birth, ID band Patient awake    Reviewed: Allergy & Precautions, H&P , NPO status , Patient's Chart, lab work & pertinent test results  Airway Mallampati: II  TM Distance: >3 FB Neck ROM: full    Dental  (+) Dental Advisory Given, Edentulous Upper   Pulmonary neg pulmonary ROS, former smoker,    Pulmonary exam normal breath sounds clear to auscultation       Cardiovascular Exercise Tolerance: Good hypertension, Pt. on medications Normal cardiovascular exam Rhythm:regular Rate:Normal     Neuro/Psych negative neurological ROS  negative psych ROS   GI/Hepatic negative GI ROS, Neg liver ROS, GERD  Medicated and Controlled,  Endo/Other  diabetes, Well Controlled, Type 2, Oral Hypoglycemic Agents  Renal/GU Renal diseasestage3 chronic kidney disease  negative genitourinary   Musculoskeletal   Abdominal   Peds  Hematology negative hematology ROS (+)   Anesthesia Other Findings   Reproductive/Obstetrics negative OB ROS                            Anesthesia Physical Anesthesia Plan  ASA: III  Anesthesia Plan: General   Post-op Pain Management:    Induction: Intravenous  Airway Management Planned: Oral ETT  Additional Equipment:   Intra-op Plan:   Post-operative Plan: Extubation in OR  Informed Consent: I have reviewed the patients History and Physical, chart, labs and discussed the procedure including the risks, benefits and alternatives for the proposed anesthesia with the patient or authorized representative who has indicated his/her understanding and acceptance.   Dental Advisory Given  Plan Discussed with: CRNA and Surgeon  Anesthesia Plan Comments:         Anesthesia Quick Evaluation

## 2015-11-29 ENCOUNTER — Ambulatory Visit (HOSPITAL_COMMUNITY): Payer: Medicare Other | Admitting: Emergency Medicine

## 2015-11-29 ENCOUNTER — Encounter (HOSPITAL_COMMUNITY): Payer: Self-pay | Admitting: *Deleted

## 2015-11-29 ENCOUNTER — Ambulatory Visit (HOSPITAL_COMMUNITY)
Admission: RE | Admit: 2015-11-29 | Discharge: 2015-11-29 | Disposition: A | Discharge status: Home / Self Care | Payer: Medicare Other | Source: Ambulatory Visit | Attending: Surgery | Admitting: Surgery

## 2015-11-29 ENCOUNTER — Ambulatory Visit (HOSPITAL_COMMUNITY): Payer: Medicare Other | Admitting: Anesthesiology

## 2015-11-29 ENCOUNTER — Encounter (HOSPITAL_COMMUNITY)
Admission: RE | Disposition: A | Discharge status: Home / Self Care | Payer: Self-pay | Source: Ambulatory Visit | Attending: Surgery

## 2015-11-29 ENCOUNTER — Ambulatory Visit (HOSPITAL_COMMUNITY): Payer: Medicare Other

## 2015-11-29 DIAGNOSIS — E785 Hyperlipidemia, unspecified: Secondary | ICD-10-CM | POA: Diagnosis not present

## 2015-11-29 DIAGNOSIS — E1122 Type 2 diabetes mellitus with diabetic chronic kidney disease: Secondary | ICD-10-CM | POA: Insufficient documentation

## 2015-11-29 DIAGNOSIS — K8010 Calculus of gallbladder with chronic cholecystitis without obstruction: Secondary | ICD-10-CM | POA: Diagnosis not present

## 2015-11-29 DIAGNOSIS — K8020 Calculus of gallbladder without cholecystitis without obstruction: Secondary | ICD-10-CM | POA: Diagnosis not present

## 2015-11-29 DIAGNOSIS — K829 Disease of gallbladder, unspecified: Secondary | ICD-10-CM

## 2015-11-29 DIAGNOSIS — K811 Chronic cholecystitis: Secondary | ICD-10-CM | POA: Diagnosis not present

## 2015-11-29 DIAGNOSIS — N183 Chronic kidney disease, stage 3 (moderate): Secondary | ICD-10-CM | POA: Diagnosis not present

## 2015-11-29 DIAGNOSIS — I129 Hypertensive chronic kidney disease with stage 1 through stage 4 chronic kidney disease, or unspecified chronic kidney disease: Secondary | ICD-10-CM | POA: Insufficient documentation

## 2015-11-29 DIAGNOSIS — Z87891 Personal history of nicotine dependence: Secondary | ICD-10-CM | POA: Insufficient documentation

## 2015-11-29 HISTORY — PX: CHOLECYSTECTOMY: SHX55

## 2015-11-29 LAB — GLUCOSE, CAPILLARY
Glucose-Capillary: 187 mg/dL — ABNORMAL HIGH (ref 65–99)
Glucose-Capillary: 252 mg/dL — ABNORMAL HIGH (ref 65–99)

## 2015-11-29 SURGERY — LAPAROSCOPIC CHOLECYSTECTOMY WITH INTRAOPERATIVE CHOLANGIOGRAM
Anesthesia: General

## 2015-11-29 MED ORDER — FENTANYL CITRATE (PF) 100 MCG/2ML IJ SOLN
INTRAMUSCULAR | Status: DC | PRN
  Administered 2015-11-29 (×2): 50 ug via INTRAVENOUS
  Administered 2015-11-29: 100 ug via INTRAVENOUS
  Administered 2015-11-29: 50 ug via INTRAVENOUS

## 2015-11-29 MED ORDER — ROCURONIUM BROMIDE 50 MG/5ML IV SOLN
INTRAVENOUS | Status: AC
  Filled 2015-11-29: qty 1

## 2015-11-29 MED ORDER — ROCURONIUM BROMIDE 100 MG/10ML IV SOLN
INTRAVENOUS | Status: DC | PRN
  Administered 2015-11-29: 10 mg via INTRAVENOUS
  Administered 2015-11-29: 5 mg via INTRAVENOUS
  Administered 2015-11-29: 40 mg via INTRAVENOUS

## 2015-11-29 MED ORDER — NEOSTIGMINE METHYLSULFATE 10 MG/10ML IV SOLN
INTRAVENOUS | Status: AC
  Filled 2015-11-29: qty 1

## 2015-11-29 MED ORDER — PHENYLEPHRINE 40 MCG/ML (10ML) SYRINGE FOR IV PUSH (FOR BLOOD PRESSURE SUPPORT)
PREFILLED_SYRINGE | INTRAVENOUS | Status: AC
  Filled 2015-11-29: qty 10

## 2015-11-29 MED ORDER — EPHEDRINE SULFATE 50 MG/ML IJ SOLN
INTRAMUSCULAR | Status: AC
  Filled 2015-11-29: qty 1

## 2015-11-29 MED ORDER — GLYCOPYRROLATE 0.2 MG/ML IJ SOLN
INTRAMUSCULAR | Status: AC
  Filled 2015-11-29: qty 2

## 2015-11-29 MED ORDER — FENTANYL CITRATE (PF) 100 MCG/2ML IJ SOLN
INTRAMUSCULAR | Status: AC
  Administered 2015-11-29: 25 ug via INTRAVENOUS
  Filled 2015-11-29: qty 2

## 2015-11-29 MED ORDER — LIDOCAINE HCL (CARDIAC) 20 MG/ML IV SOLN
INTRAVENOUS | Status: AC
  Filled 2015-11-29: qty 5

## 2015-11-29 MED ORDER — 0.9 % SODIUM CHLORIDE (POUR BTL) OPTIME
TOPICAL | Status: DC | PRN
  Administered 2015-11-29: 1000 mL

## 2015-11-29 MED ORDER — FENTANYL CITRATE (PF) 100 MCG/2ML IJ SOLN
25.0000 ug | INTRAMUSCULAR | Status: DC | PRN
  Administered 2015-11-29 (×2): 25 ug via INTRAVENOUS

## 2015-11-29 MED ORDER — PROPOFOL 10 MG/ML IV BOLUS
INTRAVENOUS | Status: AC
  Filled 2015-11-29: qty 20

## 2015-11-29 MED ORDER — IOHEXOL 300 MG/ML  SOLN
INTRAMUSCULAR | Status: DC | PRN
  Administered 2015-11-29: 20 mL

## 2015-11-29 MED ORDER — ONDANSETRON 4 MG PO TBDP: 4.0000 mg | ORAL_TABLET | Freq: Three times a day (TID) | ORAL | Status: DC | PRN

## 2015-11-29 MED ORDER — ONDANSETRON HCL 4 MG/2ML IJ SOLN
INTRAMUSCULAR | Status: AC
  Filled 2015-11-29: qty 2

## 2015-11-29 MED ORDER — SODIUM CHLORIDE 0.9 % IJ SOLN
INTRAMUSCULAR | Status: AC
  Filled 2015-11-29: qty 10

## 2015-11-29 MED ORDER — PROPOFOL 10 MG/ML IV BOLUS
INTRAVENOUS | Status: DC | PRN
  Administered 2015-11-29: 150 mg via INTRAVENOUS

## 2015-11-29 MED ORDER — PHENYLEPHRINE HCL 10 MG/ML IJ SOLN
10.0000 mg | INTRAVENOUS | Status: DC | PRN
  Administered 2015-11-29: 40 ug/min via INTRAVENOUS

## 2015-11-29 MED ORDER — ONDANSETRON HCL 4 MG/2ML IJ SOLN
INTRAMUSCULAR | Status: DC | PRN
  Administered 2015-11-29: 4 mg via INTRAVENOUS

## 2015-11-29 MED ORDER — GLYCOPYRROLATE 0.2 MG/ML IJ SOLN
INTRAMUSCULAR | Status: DC | PRN
  Administered 2015-11-29: .4 mg via INTRAVENOUS

## 2015-11-29 MED ORDER — BUPIVACAINE-EPINEPHRINE (PF) 0.25% -1:200000 IJ SOLN
INTRAMUSCULAR | Status: AC
  Filled 2015-11-29: qty 30

## 2015-11-29 MED ORDER — FENTANYL CITRATE (PF) 250 MCG/5ML IJ SOLN
INTRAMUSCULAR | Status: AC
  Filled 2015-11-29: qty 5

## 2015-11-29 MED ORDER — PROPRANOLOL HCL 1 MG/ML IV SOLN
INTRAVENOUS | Status: AC
  Filled 2015-11-29: qty 1

## 2015-11-29 MED ORDER — NEOSTIGMINE METHYLSULFATE 10 MG/10ML IV SOLN
INTRAVENOUS | Status: DC | PRN
  Administered 2015-11-29: 4 mg via INTRAVENOUS

## 2015-11-29 MED ORDER — SUCCINYLCHOLINE CHLORIDE 20 MG/ML IJ SOLN
INTRAMUSCULAR | Status: AC
  Filled 2015-11-29: qty 1

## 2015-11-29 MED ORDER — LACTATED RINGERS IV SOLN
INTRAVENOUS | Status: DC | PRN
  Administered 2015-11-29 (×2): via INTRAVENOUS

## 2015-11-29 MED ORDER — LACTATED RINGERS IV SOLN: INTRAVENOUS | Status: DC

## 2015-11-29 MED ORDER — CHLORHEXIDINE GLUCONATE 4 % EX LIQD: 1.0000 "application " | Freq: Once | CUTANEOUS | Status: DC

## 2015-11-29 MED ORDER — OXYCODONE-ACETAMINOPHEN 5-325 MG PO TABS: 1.0000 | ORAL_TABLET | ORAL | Status: DC | PRN

## 2015-11-29 MED ORDER — SODIUM CHLORIDE 0.9 % IR SOLN
Status: DC | PRN
  Administered 2015-11-29 (×2): 1000 mL

## 2015-11-29 SURGICAL SUPPLY — 42 items
APPLIER CLIP ROT 10 11.4 M/L (STAPLE) ×2
APR CLP MED LRG 11.4X10 (STAPLE) ×1
BAG SPEC RTRVL LRG 6X4 10 (ENDOMECHANICALS) ×1
BLADE SURG ROTATE 9660 (MISCELLANEOUS) ×2 IMPLANT
CANISTER SUCTION 2500CC (MISCELLANEOUS) ×2 IMPLANT
CHLORAPREP W/TINT 26ML (MISCELLANEOUS) ×2 IMPLANT
CLIP APPLIE ROT 10 11.4 M/L (STAPLE) ×1 IMPLANT
COVER MAYO STAND STRL (DRAPES) ×2 IMPLANT
COVER SURGICAL LIGHT HANDLE (MISCELLANEOUS) ×2 IMPLANT
DRAPE C-ARM 42X72 X-RAY (DRAPES) ×2 IMPLANT
DRAPE WARM FLUID 44X44 (DRAPE) ×2 IMPLANT
ELECT REM PT RETURN 9FT ADLT (ELECTROSURGICAL) ×2
ELECTRODE REM PT RTRN 9FT ADLT (ELECTROSURGICAL) ×1 IMPLANT
GLOVE BIO SURGEON STRL SZ8 (GLOVE) ×2 IMPLANT
GLOVE BIOGEL PI IND STRL 7.5 (GLOVE) ×1 IMPLANT
GLOVE BIOGEL PI IND STRL 8 (GLOVE) ×1 IMPLANT
GLOVE BIOGEL PI INDICATOR 7.5 (GLOVE) ×1
GLOVE BIOGEL PI INDICATOR 8 (GLOVE) ×1
GLOVE ECLIPSE 7.5 STRL STRAW (GLOVE) ×4 IMPLANT
GOWN STRL REUS W/ TWL LRG LVL3 (GOWN DISPOSABLE) ×2 IMPLANT
GOWN STRL REUS W/ TWL XL LVL3 (GOWN DISPOSABLE) ×2 IMPLANT
GOWN STRL REUS W/TWL LRG LVL3 (GOWN DISPOSABLE) ×4
GOWN STRL REUS W/TWL XL LVL3 (GOWN DISPOSABLE) ×4
KIT BASIN OR (CUSTOM PROCEDURE TRAY) ×2 IMPLANT
KIT ROOM TURNOVER OR (KITS) ×2 IMPLANT
LIQUID BAND (GAUZE/BANDAGES/DRESSINGS) ×2 IMPLANT
NS IRRIG 1000ML POUR BTL (IV SOLUTION) ×2 IMPLANT
PAD ARMBOARD 7.5X6 YLW CONV (MISCELLANEOUS) ×2 IMPLANT
POUCH SPECIMEN RETRIEVAL 10MM (ENDOMECHANICALS) ×2 IMPLANT
SCISSORS LAP 5X35 DISP (ENDOMECHANICALS) ×2 IMPLANT
SET CHOLANGIOGRAPH 5 50 .035 (SET/KITS/TRAYS/PACK) ×2 IMPLANT
SET IRRIG TUBING LAPAROSCOPIC (IRRIGATION / IRRIGATOR) ×2 IMPLANT
SLEEVE ENDOPATH XCEL 5M (ENDOMECHANICALS) ×2 IMPLANT
SPECIMEN JAR SMALL (MISCELLANEOUS) ×2 IMPLANT
SUT MNCRL AB 4-0 PS2 18 (SUTURE) ×4 IMPLANT
SUT VICRYL 0 UR6 27IN ABS (SUTURE) ×2 IMPLANT
TOWEL OR 17X26 10 PK STRL BLUE (TOWEL DISPOSABLE) ×2 IMPLANT
TRAY LAPAROSCOPIC MC (CUSTOM PROCEDURE TRAY) ×2 IMPLANT
TROCAR XCEL BLUNT TIP 100MML (ENDOMECHANICALS) ×2 IMPLANT
TROCAR XCEL NON-BLD 11X100MML (ENDOMECHANICALS) ×2 IMPLANT
TROCAR XCEL NON-BLD 5MMX100MML (ENDOMECHANICALS) ×2 IMPLANT
TUBING INSUFFLATION (TUBING) ×2 IMPLANT

## 2015-11-29 NOTE — Transfer of Care (Signed)
Immediate Anesthesia Transfer of Care Note  Patient: Edwin Osborne  Procedure(s) Performed: Procedure(s): LAPAROSCOPIC CHOLECYSTECTOMY WITH INTRAOPERATIVE CHOLANGIOGRAM (N/A)  Patient Location: PACU  Anesthesia Type:General  Level of Consciousness: awake, sedated and patient cooperative  Airway & Oxygen Therapy: Patient Spontanous Breathing and Patient connected to face mask oxygen  Post-op Assessment: Report given to RN, Post -op Vital signs reviewed and stable and Patient moving all extremities  Post vital signs: Reviewed and stable  Last Vitals:  Filed Vitals:   11/29/15 0632  BP: 134/66  Pulse: 81  Temp: 36.7 C  Resp: 18    Complications: No apparent anesthesia complications

## 2015-11-29 NOTE — Anesthesia Postprocedure Evaluation (Signed)
Anesthesia Post Note  Patient: Edwin Osborne  Procedure(s) Performed: Procedure(s) (LRB): LAPAROSCOPIC CHOLECYSTECTOMY WITH INTRAOPERATIVE CHOLANGIOGRAM (N/A)  Patient location during evaluation: PACU Anesthesia Type: General Level of consciousness: awake and alert Pain management: pain level controlled Vital Signs Assessment: post-procedure vital signs reviewed and stable Respiratory status: spontaneous breathing, nonlabored ventilation, respiratory function stable and patient connected to nasal cannula oxygen Cardiovascular status: blood pressure returned to baseline and stable Postop Assessment: no signs of nausea or vomiting Anesthetic complications: no    Last Vitals:  Filed Vitals:   11/29/15 1015 11/29/15 1030  BP: 127/61   Pulse: 75 56  Temp:    Resp: 16 18    Last Pain:  Filed Vitals:   11/29/15 1044  PainSc: Asleep                 Kallyn Demarcus L

## 2015-11-29 NOTE — Anesthesia Procedure Notes (Signed)
Procedure Name: Intubation Date/Time: 11/29/2015 7:28 AM Performed by: Izora Gala Pre-anesthesia Checklist: Patient identified, Emergency Drugs available, Suction available and Patient being monitored Patient Re-evaluated:Patient Re-evaluated prior to inductionOxygen Delivery Method: Circle system utilized Preoxygenation: Pre-oxygenation with 100% oxygen Intubation Type: IV induction Ventilation: Mask ventilation without difficulty and Oral airway inserted - appropriate to patient size Laryngoscope Size: Miller and 3 Grade View: Grade II Tube type: Oral Tube size: 7.5 mm Number of attempts: 1 Airway Equipment and Method: Stylet Placement Confirmation: ETT inserted through vocal cords under direct vision,  positive ETCO2 and breath sounds checked- equal and bilateral Secured at: 22 cm Tube secured with: Tape Dental Injury: Teeth and Oropharynx as per pre-operative assessment

## 2015-11-29 NOTE — Op Note (Signed)
Laparoscopic Cholecystectomy with IOC Procedure Note  Indications: This patient presents with symptomatic gallbladder disease and will undergo laparoscopic cholecystectomy. The procedure has been discussed with the patient. Operative and non operative treatments have been discussed. Risks of surgery include bleeding, infection,  Common bile duct injury,  Injury to the stomach,liver, colon,small intestine, abdominal wall,  Diaphragm,  Major blood vessels,  And the need for an open procedure.  Other risks include worsening of medical problems, death,  DVT and pulmonary embolism, and cardiovascular events.   Medical options have also been discussed. The patient has been informed of long term expectations of surgery and non surgical options,  The patient agrees to proceed.    Pre-operative Diagnosis: Calculus of gallbladder without mention of cholecystitis or obstruction  Post-operative Diagnosis: Same  Surgeon: Gaylin Osoria A.   Assistants: Hitchcock RNFA  Anesthesia: General endotracheal anesthesia and Local anesthesia 0.25.% bupivacaine, with epinephrine  ASA Class: 3  Procedure Details  The patient was seen again in the Holding Room. The risks, benefits, complications, treatment options, and expected outcomes were discussed with the patient. The possibilities of reaction to medication, pulmonary aspiration, perforation of viscus, bleeding, recurrent infection, finding a normal gallbladder, the need for additional procedures, failure to diagnose a condition, the possible need to convert to an open procedure, and creating a complication requiring transfusion or operation were discussed with the patient. The patient and/or family concurred with the proposed plan, giving informed consent. The site of surgery properly noted/marked. The patient was taken to Operating Room, identified as Edwin Osborne and the procedure verified as Laparoscopic Cholecystectomy with Intraoperative Cholangiograms. A  Time Out was held and the above information confirmed.  Prior to the induction of general anesthesia, antibiotic prophylaxis was administered. General endotracheal anesthesia was then administered and tolerated well. After the induction, the abdomen was prepped in the usual sterile fashion. The patient was positioned in the supine position with the left arm comfortably tucked, along with some reverse Trendelenburg.  Local anesthetic agent was injected into the skin near the umbilicus and an incision made. The midline fascia was incised and the Hasson technique was used to introduce a 12 mm port under direct vision. It was secured with a figure of eight Vicryl suture placed in the usual fashion. Pneumoperitoneum was then created with CO2 and tolerated well without any adverse changes in the patient's vital signs. Additional trocars were introduced under direct vision with an 11 mm trocar in the epigastrium and TWO  5 mm trocars in the right upper quadrant. All skin incisions were infiltrated with a local anesthetic agent before making the incision and placing the trocars.   The gallbladder was identified, the fundus grasped and retracted cephalad. Adhesions were lysed bluntly and with the electrocautery where indicated, taking care not to injure any adjacent organs or viscus. The infundibulum was grasped and retracted laterally, exposing the peritoneum overlying the triangle of Calot. This was then divided and exposed in a blunt fashion. The cystic duct was clearly identified and bluntly dissected circumferentially. The junctions of the gallbladder, cystic duct and common bile duct were clearly identified prior to the division of any linear structure.   An incision was made in the cystic duct and the cholangiogram catheter introduced. The catheter was secured using an endoclip. The study showed no stones and good visualization of the distal and proximal biliary tree. The catheter was then removed.   The  cystic duct was then  ligated with surgical clips  on the patient  side and  clipped on the gallbladder side and divided. The cystic artery was identified, dissected free, ligated with clips and divided as well. Posterior cystic artery  Was clipped and divided.  The gallbladder was dissected from the liver bed in retrograde fashion with the electrocautery. The gallbladder was removed and placed in an Endocatch bag and removed.  The liver bed was irrigated and inspected. Hemostasis was achieved with the electrocautery. Copious irrigation was utilized and was repeatedly aspirated until clear all particulate matter. Hemostasis was achieved with no signs  of bleeding or bile leakage.  Pneumoperitoneum was completely reduced after viewing removal of the trocars under direct vision. The wound was thoroughly irrigated and the fascia was then closed with a figure of eight suture; the skin was then closed with 4 O monocryl and a liquid adhesive  was applied.  Instrument, sponge, and needle counts were correct at closure and at the conclusion of the case.   Findings: Cholelithiasis  Estimated Blood Loss: less than 50 mL         Drains: none          Total IV Fluids: 1000 mL         Specimens: Gallbladder           Complications: None; patient tolerated the procedure well.         Disposition: PACU - hemodynamically stable.         Condition: stable

## 2015-11-29 NOTE — Interval H&P Note (Signed)
History and Physical Interval Note:  11/29/2015 7:11 AM  Edwin Osborne  has presented today for surgery, with the diagnosis of Gallstones, chronic cholecystitis  The various methods of treatment have been discussed with the patient and family. After consideration of risks, benefits and other options for treatment, the patient has consented to  Procedure(s): LAPAROSCOPIC CHOLECYSTECTOMY WITH INTRAOPERATIVE CHOLANGIOGRAM (N/A) as a surgical intervention .  The patient's history has been reviewed, patient examined, no change in status, stable for surgery.  I have reviewed the patient's chart and labs.  Questions were answered to the patient's satisfaction.     Usman Millett A.

## 2015-11-29 NOTE — Discharge Instructions (Signed)
[facility] ______[facility] °LAPAROSCOPIC SURGERY: POST OP INSTRUCTIONS °Always review your discharge instruction sheet given to you by the facility where your surgery was performed. °IF YOU HAVE DISABILITY OR FAMILY LEAVE FORMS, YOU MUST BRING THEM TO THE OFFICE FOR PROCESSING.   °DO NOT GIVE THEM TO YOUR DOCTOR. ° °1. A prescription for pain medication may be given to you upon discharge.  Take your pain medication as prescribed, if needed.  If narcotic pain medicine is not needed, then you may take acetaminophen (Tylenol) or ibuprofen (Advil) as needed. °2. Take your usually prescribed medications unless otherwise directed. °3. If you need a refill on your pain medication, please contact your pharmacy.  They will contact our office to request authorization. Prescriptions will not be filled after 5pm or on week-ends. °4. You should follow a light diet the first few days after arrival home, such as soup and crackers, etc.  Be sure to include lots of fluids daily. °5. Most patients will experience some swelling and bruising in the area of the incisions.  Ice packs will help.  Swelling and bruising can take several days to resolve.  °6. It is common to experience some constipation if taking pain medication after surgery.  Increasing fluid intake and taking a stool softener (such as Colace) will usually help or prevent this problem from occurring.  A mild laxative (Milk of Magnesia or Miralax) should be taken according to package instructions if there are no bowel movements after 48 hours. °7. Unless discharge instructions indicate otherwise, you may remove your bandages 24-48 hours after surgery, and you may shower at that time.  You may have steri-strips (small skin tapes) in place directly over the incision.  These strips should be left on the skin for 7-10 days.  If your surgeon used skin glue on the incision, you may shower in 24 hours.  The glue will flake off over the next 2-3 weeks.  Any sutures or  staples will be removed at the office during your follow-up visit. °8. ACTIVITIES:  You may resume regular (light) daily activities beginning the next day--such as daily self-care, walking, climbing stairs--gradually increasing activities as tolerated.  You may have sexual intercourse when it is comfortable.  Refrain from any heavy lifting or straining until approved by your doctor. °a. You may drive when you are no longer taking prescription pain medication, you can comfortably wear a seatbelt, and you can safely maneuver your car and apply brakes. °b. RETURN TO WORK:  __________________________________________________________ °9. You should see your doctor in the office for a follow-up appointment approximately 2-3 weeks after your surgery.  Make sure that you call for this appointment within a day or two after you arrive home to insure a convenient appointment time. °10. OTHER INSTRUCTIONS: __________________________________________________________________________________________________________________________ __________________________________________________________________________________________________________________________ °WHEN TO CALL YOUR DOCTOR: °1. Fever over 101.0 °2. Inability to urinate °3. Continued bleeding from incision. °4. Increased pain, redness, or drainage from the incision. °5. Increasing abdominal pain ° °The clinic staff is available to answer your questions during regular business hours.  Please don’t hesitate to call and ask to speak to one of the nurses for clinical concerns.  If you have a medical emergency, go to the nearest emergency room or call 911.  A surgeon from [facility] is always on call at the hospital. °[address], [address], [place], [place]  [address] ? [address] [address], [place], [place]   [address] °[phone] ? [phone] ? FAX [phone] °Web site:   [email or web address] °

## 2015-11-29 NOTE — H&P (View-Only) (Signed)
Rangel L. Roy 11/12/2015 9:23 AM Location: [facility] Patient #: [id number] DOB: 1934-11-02 Married / Language: English / Race: White Male  History of Present Illness Marcello Moores A. Elita Dame MD; 11/12/2015 10:25 AM) Patient words: Pt sent at the request of [facility] for right upper quadrant abdominal pain on and off for 9 months. The pain comes and goes. Intermittent in nature made worse with fatty greasy foods. No radiation to his back or chest. Ultrasound and CT scan showed gallstones without inflammation. Pain is moderate and last minutes to hours at best. He does have nausea or heartburn associated with it.  The patient is a 81 year old male.   Other Problems Elbert Ewings, CMA; 11/12/2015 9:23 AM) Back Pain Diabetes Mellitus Diverticulosis Enlarged Prostate High blood pressure Hypercholesterolemia  Past Surgical History Elbert Ewings, CMA; 11/12/2015 9:23 AM) Colon Polyp Removal - Colonoscopy Knee Surgery Left.  Diagnostic Studies History Elbert Ewings, [place]; 11/12/2015 9:23 AM) Colonoscopy 5-10 years ago  Allergies Elbert Ewings, CMA; 11/12/2015 9:24 AM) No Known Drug Allergies 11/12/2015  Medication History Elbert Ewings, CMA; 11/12/2015 9:26 AM) Aspirin (81MG  Tablet, Oral) Active. Cinnamon (500MG  Tablet, Oral) Active. Flexeril (10MG  Tablet, Oral) Active. Doxazosin Mesylate (8MG  Tablet, Oral) Active. GlipiZIDE ER (5MG  Tablet ER 24HR, Oral) Active. HydroCHLOROthiazide (25MG  Tablet, Oral) Active. Lisinopril (40MG  Tablet, Oral) Active. Magnesium (110MG  Capsule, Oral) Active. MetFORMIN HCl ER (500MG  Tablet ER 24HR, Oral) Active. Multiple Vitamin (Oral) Active. Pravastatin Sodium (40MG  Tablet, Oral) Active. Vitamin D3 Active. Medications Reconciled  Social History Elbert Ewings, [place]; 11/12/2015 9:23 AM) Alcohol use Occasional alcohol use. Caffeine use Coffee, Tea. No drug use  Family History Elbert Ewings, [place]; 11/12/2015 9:23  AM) Diabetes Mellitus Brother. Hypertension Brother. Prostate Cancer Brother.     Review of Systems Elbert Ewings CMA; 11/12/2015 9:23 AM) General Present- Fatigue and Night Sweats. Not Present- Appetite Loss, Chills, Fever, Weight Gain and Weight Loss. Skin Not Present- Change in Wart/Mole, Dryness, Hives, Jaundice, New Lesions, Non-Healing Wounds, Rash and Ulcer. HEENT Present- Seasonal Allergies and Wears glasses/contact lenses. Not Present- Earache, Hearing Loss, Hoarseness, Nose Bleed, Oral Ulcers, Ringing in the Ears, Sinus Pain, Sore Throat, Visual Disturbances and Yellow Eyes. Respiratory Not Present- Bloody sputum, Chronic Cough, Difficulty Breathing, Snoring and Wheezing. Breast Not Present- Breast Mass, Breast Pain, Nipple Discharge and Skin Changes. Cardiovascular Present- Difficulty Breathing Lying Down and Swelling of Extremities. Not Present- Chest Pain, Leg Cramps, Palpitations, Rapid Heart Rate and Shortness of Breath. Gastrointestinal Present- Abdominal Pain and Bloating. Not Present- Bloody Stool, Change in Bowel Habits, Chronic diarrhea, Constipation, Difficulty Swallowing, Excessive gas, Gets full quickly at meals, Hemorrhoids, Indigestion, Nausea, Rectal Pain and Vomiting. Male Genitourinary Present- Frequency, Impotence, Nocturia, Urgency and Urine Leakage. Not Present- Blood in Urine, Change in Urinary Stream and Painful Urination. Musculoskeletal Present- Back Pain. Not Present- Joint Pain, Joint Stiffness, Muscle Pain, Muscle Weakness and Swelling of Extremities. Neurological Present- Fainting. Not Present- Decreased Memory, Headaches, Numbness, Seizures, Tingling, Tremor, Trouble walking and Weakness. Psychiatric Not Present- Anxiety, Bipolar, Change in Sleep Pattern, Depression, Fearful and Frequent crying. Endocrine Present- New Diabetes. Not Present- Cold Intolerance, Excessive Hunger, Hair Changes, Heat Intolerance and Hot flashes. Hematology Present- Easy  Bruising and Excessive bleeding. Not Present- Gland problems, HIV and Persistent Infections.  Vitals Elbert Ewings CMA; 11/12/2015 9:27 AM) 11/12/2015 9:26 AM Weight: 251 lb Height: 70in Body Surface Area: 2.3 m Body Mass Index: 36.01 kg/m  Temp.: 97.32F(Temporal)  Pulse: 78 (Regular)  BP: 138/84 (Sitting, Left Arm, Standard)  Physical Exam (Shunna Mikaelian A. Chynah Orihuela MD; 11/12/2015 10:25 AM)  General Mental Status-Alert. General Appearance-Consistent with stated age. Hydration-Well hydrated. Voice-Normal.  Head and Neck Head-normocephalic, atraumatic with no lesions or palpable masses.  Eye Eyeball - Bilateral-Extraocular movements intact. Sclera/Conjunctiva - Bilateral-No scleral icterus.  Chest and Lung Exam Chest and lung exam reveals -quiet, even and easy respiratory effort with no use of accessory muscles and on auscultation, normal breath sounds, no adventitious sounds and normal vocal resonance. Inspection Chest Wall - Normal. Back - normal.  Cardiovascular Cardiovascular examination reveals -on palpation PMI is normal in location and amplitude, no palpable S3 or S4. Normal cardiac borders., normal heart sounds, regular rate and rhythm with no murmurs, carotid auscultation reveals no bruits and normal pedal pulses bilaterally.  Abdomen Inspection Inspection of the abdomen reveals - No Hernias. Skin - Scar - no surgical scars. Palpation/Percussion Palpation and Percussion of the abdomen reveal - Soft, Non Tender, No Rebound tenderness, No Rigidity (guarding) and No hepatosplenomegaly. Auscultation Auscultation of the abdomen reveals - Bowel sounds normal.  Neurologic Neurologic evaluation reveals -alert and oriented x 3 with no impairment of recent or remote memory. Mental Status-Normal.  Musculoskeletal Normal Exam - Left-Upper Extremity Strength Normal and Lower Extremity Strength Normal. Normal Exam - Right-Upper Extremity  Strength Normal, Lower Extremity Weakness.    Assessment & Plan (Aryon Nham A. Trapper Meech MD; 11/12/2015 10:25 AM)  CHRONIC CHOLECYSTITIS WITH CALCULUS (K80.10) Impression: Discussed medical and surgical options with the patient today. The pros and cons of each discussed. Pathophysiology of gallbladder disease discussed. He has opted for laparoscopic cholecystectomy with cholangiogram. The procedure has been discussed with the patient. Risks of laparoscopic cholecystectomy include bleeding, infection, bile duct injury, leak, death, open surgery, diarrhea, other surgery, organ injury, blood vessel injury, DVT, and additional care.  Current Plans You are being scheduled for surgery - Our schedulers will call you.  You should hear from our office's scheduling department within 5 working days about the location, date, and time of surgery. We try to make accommodations for patient's preferences in scheduling surgery, but sometimes the OR schedule or the surgeon's schedule prevents [place] from making those accommodations.  If you have not heard from our office [phone]) in 5 working days, call the office and ask for your surgeon's nurse.  If you have other questions about your diagnosis, plan, or surgery, call the office and ask for your surgeon's nurse.  Pt Education - Pamphlet Given - Laparoscopic Gallbladder Surgery: discussed with patient and provided information. The anatomy & physiology of hepatobiliary & pancreatic function was discussed. The pathophysiology of gallbladder dysfunction was discussed. Natural history risks without surgery was discussed. I feel the risks of no intervention will lead to serious problems that outweigh the operative risks; therefore, I recommended cholecystectomy to remove the pathology. I explained laparoscopic techniques with possible need for an open approach. Probable cholangiogram to evaluate the bilary tract was explained as well.  Risks such as bleeding, infection,  abscess, leak, injury to other organs, need for further treatment, heart attack, death, and other risks were discussed. I noted a good likelihood this will help address the problem. Possibility that this will not correct all abdominal symptoms was explained. Goals of post-operative recovery were discussed as well. We will work to minimize complications. An educational handout further explaining the pathology and treatment options was given as well. Questions were answered. The patient expresses understanding & wishes to proceed with surgery.  Pt Education - CCS Laparosopic Post Op HCI (Gross) Pt  Education - Laparoscopic Cholecystectomy: gallbladder

## 2015-11-30 ENCOUNTER — Encounter (HOSPITAL_COMMUNITY): Payer: Self-pay | Admitting: Surgery

## 2015-12-07 ENCOUNTER — Encounter: Payer: Self-pay | Admitting: Internal Medicine

## 2016-01-18 ENCOUNTER — Encounter: Payer: Self-pay | Admitting: Internal Medicine

## 2016-01-18 ENCOUNTER — Ambulatory Visit (INDEPENDENT_AMBULATORY_CARE_PROVIDER_SITE_OTHER): Payer: Medicare Other | Admitting: Internal Medicine

## 2016-01-18 VITALS — BP 142/80 | HR 60 | Temp 97.5°F | Resp 16 | Ht 69.0 in | Wt 246.0 lb

## 2016-01-18 DIAGNOSIS — Z136 Encounter for screening for cardiovascular disorders: Secondary | ICD-10-CM

## 2016-01-18 DIAGNOSIS — E782 Mixed hyperlipidemia: Secondary | ICD-10-CM | POA: Diagnosis not present

## 2016-01-18 DIAGNOSIS — E1129 Type 2 diabetes mellitus with other diabetic kidney complication: Secondary | ICD-10-CM | POA: Diagnosis not present

## 2016-01-18 DIAGNOSIS — E559 Vitamin D deficiency, unspecified: Secondary | ICD-10-CM | POA: Diagnosis not present

## 2016-01-18 DIAGNOSIS — Z79899 Other long term (current) drug therapy: Secondary | ICD-10-CM

## 2016-01-18 DIAGNOSIS — E1121 Type 2 diabetes mellitus with diabetic nephropathy: Secondary | ICD-10-CM

## 2016-01-18 DIAGNOSIS — I10 Essential (primary) hypertension: Secondary | ICD-10-CM

## 2016-01-18 DIAGNOSIS — E1142 Type 2 diabetes mellitus with diabetic polyneuropathy: Secondary | ICD-10-CM

## 2016-01-18 DIAGNOSIS — Z125 Encounter for screening for malignant neoplasm of prostate: Secondary | ICD-10-CM | POA: Diagnosis not present

## 2016-01-18 DIAGNOSIS — Z1212 Encounter for screening for malignant neoplasm of rectum: Secondary | ICD-10-CM

## 2016-01-18 LAB — BASIC METABOLIC PANEL WITH GFR
BUN: 23 mg/dL (ref 7–25)
CO2: 25 mmol/L (ref 20–31)
Calcium: 9.5 mg/dL (ref 8.6–10.3)
Chloride: 103 mmol/L (ref 98–110)
Creat: 1.72 mg/dL — ABNORMAL HIGH (ref 0.70–1.18)
GFR, Est African American: 43 mL/min — ABNORMAL LOW (ref >=60)
GFR, Est Non African American: 37 mL/min — ABNORMAL LOW (ref >=60)
Glucose, Bld: 143 mg/dL — ABNORMAL HIGH (ref 65–99)
Potassium: 4.5 mmol/L (ref 3.5–5.3)
Sodium: 139 mmol/L (ref 135–146)

## 2016-01-18 LAB — HEPATIC FUNCTION PANEL
ALT: 17 U/L (ref 9–46)
AST: 18 U/L (ref 10–35)
Albumin: 4.1 g/dL (ref 3.6–5.1)
Alkaline Phosphatase: 44 U/L (ref 40–115)
Bilirubin, Direct: 0.1 mg/dL (ref <=0.2)
Indirect Bilirubin: 0.5 mg/dL (ref 0.2–1.2)
Total Bilirubin: 0.6 mg/dL (ref 0.2–1.2)
Total Protein: 6.2 g/dL (ref 6.1–8.1)

## 2016-01-18 LAB — CBC WITH DIFFERENTIAL/PLATELET
Basophils Absolute: 0 cells/uL (ref 0–200)
Basophils Relative: 0 %
Eosinophils Absolute: 272 cells/uL (ref 15–500)
Eosinophils Relative: 4 %
HCT: 39.4 % (ref 38.5–50.0)
Hemoglobin: 13 g/dL — ABNORMAL LOW (ref 13.2–17.1)
Lymphocytes Relative: 24 %
Lymphs Abs: 1632 cells/uL (ref 850–3900)
MCH: 28.9 pg (ref 27.0–33.0)
MCHC: 33 g/dL (ref 32.0–36.0)
MCV: 87.6 fL (ref 80.0–100.0)
MPV: 10.6 fL (ref 7.5–12.5)
Monocytes Absolute: 748 cells/uL (ref 200–950)
Monocytes Relative: 11 %
Neutro Abs: 4148 cells/uL (ref 1500–7800)
Neutrophils Relative %: 61 %
Platelets: 192 10*3/uL (ref 140–400)
RBC: 4.5 MIL/uL (ref 4.20–5.80)
RDW: 14.5 % (ref 11.0–15.0)
WBC: 6.8 10*3/uL (ref 3.8–10.8)

## 2016-01-18 LAB — LIPID PANEL
Cholesterol: 138 mg/dL (ref 125–200)
HDL: 36 mg/dL — ABNORMAL LOW (ref >=40)
LDL Cholesterol: 48 mg/dL (ref <130)
Total CHOL/HDL Ratio: 3.8 Ratio (ref <=5.0)
Triglycerides: 271 mg/dL — ABNORMAL HIGH (ref <150)
VLDL: 54 mg/dL — ABNORMAL HIGH (ref <30)

## 2016-01-18 LAB — TSH: TSH: 2.4 mIU/L (ref 0.40–4.50)

## 2016-01-18 LAB — MAGNESIUM: Magnesium: 1.9 mg/dL (ref 1.5–2.5)

## 2016-01-18 NOTE — Patient Instructions (Signed)

## 2016-01-18 NOTE — Progress Notes (Addendum)
Patient ID: Edwin Osborne, male   DOB: September 27, 1934, 81 y.o.   MRN: [id number]  Comprehensive Evaluation & Examination  This very nice 81 y.o. MWM presents for a comprehensive evaluation and management of multiple medical co-morbidities.  Patient has been followed for HTN, T2_NIDDM  , Hyperlipidemia and Vitamin D Deficiency.   HTN predates since 28. Patient's BP has been controlled at home.Today's BP is 142/80. Patient denies any cardiac symptoms as chest pain, palpitations, shortness of breath, dizziness or ankle swelling.   Patient's hyperlipidemia is controlled with diet and medications. Patient denies myalgias or other medication SE's. Last lipids were  Cholesterol 136; HDL 33*; LDL 42; and elevated Triglycerides 306 on 08/31/2015.   Patient has Morbid Obesity (BMI 36+) and consequent T2_NIDDM since 1999 and has consequent CKD 3 with GFR 42 mkl/min.  Patient denies infrequently checks CBG's and denies  reactive hypoglycemic symptoms, visual blurring, diabetic polys or paresthesias.  Patient is not compliant with recommended diet for weight loss and last A1c was not at goal 8.0% on 11/26/2015.     Finally, patient has history of Vitamin D Deficiency of "32" in 2008 and last vitamin D was 55 on 01/24/2015.    Medication Sig  . aspirin 81 MG tablet Take 1 tab daily.   . Cinnamon 500 MG TABS Take 1,000 mg by mouth.  . cyclobenzaprine 10 MG  Take 1 tab every 8  hours as needed for muscle spasms.  Marland Kitchen doxazosin  8 MG tablet TAKE 1 TAB  . glipiZIDE  5 MG Take 1 tab 2  times daily.  . hctz 25 MG TAKE 1 TABLET EVERY DAY   . lisinopril 40 MG  TAKE 1 TAB DAILY   . metFORMIN-XR 500 MG  TAKE 2 TAB TWICE A DAY  . Multiple Vitamins-Minerals  Take 1 tablet  daily.    Marland Kitchen VITAMIN D MAINTENANCE  Take 10,000 mg  daily.    Marland Kitchen omeprazole  40 MG  TAKE 1 CAPDAILY  . Ondansetron ODT 4 MG  Take 1 tab every 8 (eight) hours as needed for nausea or vomiting.  Marland Kitchen oxyCODONE-acetaminophen  5-325  Take 1 tab every 4 (four)  hours as needed.  . pravastatin  40 MG  TAKE 1 TAB AT BEDTIME  .  (MAG AMINO ACID CHELATE, 133 MG  Take 1 tablet by mouth daily.     No Known Allergies   Past Medical History  Diagnosis Date  . Hyperlipidemia   . Hypertension   . BPH (benign prostatic hyperplasia)   . Cataract   . Diabetes mellitus   . Depression     "usually a happy go lucky"  . GERD (gastroesophageal reflux disease)     sometimes will take omeprazole  . Arthritis   . CKD (chronic kidney disease), stage III    Health Maintenance  Topic Date Due  . TETANUS/TDAP  04/06/1956  . ZOSTAVAX  04/06/1997  . COLONOSCOPY  08/20/2014  . OPHTHALMOLOGY EXAM  09/09/2015  . FOOT EXAM  01/24/2016  . INFLUENZA VACCINE  04/22/2016  . HEMOGLOBIN A1C  05/28/2016  . PNA vac Low Risk Adult  Completed   Immunization History  Administered Date(s) Administered  . DT 08/31/2015  . Influenza Whole 05/24/2011, 08/06/2012, 07/07/2013  . Influenza, High Dose Seasonal PF 06/21/2014, 08/31/2015  . Pneumococcal Conjugate-13 06/21/2014  . Pneumococcal Polysaccharide-23 03/10/2005   Past Surgical History  Procedure Date  . Tonsillectomy and adenoidectomy   . Vein ligation and stripping  - left   .  Knee arthroscopy   . Colonoscopy  - 2005 tubulovillous adenoma and adenomas 2012 - 3 adenomas diverticulosis 2005, 08/21/11  . Fracture surgery  -  ? right foot as a child      LAPAROSCOPIC CHOLECYSTECTOMY WITH INTRAOPERATIVE CHOLANGIOGRAM; Erroll Luna, MD - 11/29/2015   Social History   Social History  . Marital Status: Married    Spouse Name: N/A  . Number of Children: N/A  . Years of Education: N/A   Occupational History  . Retired Electrical engineer 2001.    Social History Main Topics  . Smoking status: Former Smoker    Types: Cigars    Quit date: 09/23/1987  . Smokeless tobacco: Never Used  . Alcohol Use: Yes     Comment: rarely  . Drug Use: No  . Sexual Activity: Not on file    ROS Constitutional: Denies fever,  chills, weight loss/gain, headaches, insomnia,  night sweats or change in appetite. Does c/o fatigue. Eyes: Denies redness, blurred vision, diplopia, discharge, itchy or watery eyes.  ENT: Denies discharge, congestion, post nasal drip, epistaxis, sore throat, earache, hearing loss, dental pain, Tinnitus, Vertigo, Sinus pain or snoring.  Cardio: Denies chest pain, palpitations, irregular heartbeat, syncope, dyspnea, diaphoresis, orthopnea, PND, claudication or edema Respiratory: denies cough, dyspnea, DOE, pleurisy, hoarseness, laryngitis or wheezing.  Gastrointestinal: Denies dysphagia, heartburn, reflux, water brash, pain, cramps, nausea, vomiting, bloating, diarrhea, constipation, hematemesis, melena, hematochezia, jaundice or hemorrhoids Genitourinary: Denies dysuria, frequency, urgency, nocturia, hesitancy, discharge, hematuria or flank pain Musculoskeletal: Denies arthralgia, myalgia, stiffness, Jt. Swelling, pain, limp or strain/sprain. Denies Falls. Skin: Denies puritis, rash, hives, warts, acne, eczema or change in skin lesion Neuro: No weakness, tremor, incoordination, spasms, paresthesia or pain Psychiatric: Denies confusion, memory loss or sensory loss. Denies Depression. Endocrine: Denies change in weight, skin, hair change, nocturia, and paresthesia, diabetic polys, visual blurring or hyper / hypo glycemic episodes.  Heme/Lymph: No excessive bleeding, bruising or enlarged lymph nodes.  Physical Exam  BP 142/80 mmHg  Pulse 60  Temp(Src) 97.5 F (36.4 C)  Resp 16  Ht 5\' 9"  (1.753 m)  Wt 246 lb (111.585 kg)  BMI 36.31 kg/m2  General Appearance: Well nourished, in no apparent distress. Eyes: PERRLA, EOMs, conjunctiva no swelling or erythema, normal fundi and vessels. Sinuses: No frontal/maxillary tenderness ENT/Mouth: EACs patent / TMs  nl. Nares clear without erythema, swelling, mucoid exudates. Oral hygiene is good. No erythema, swelling, or exudate. Tongue normal,  non-obstructing. Tonsils not swollen or erythematous. Hearing normal.  Neck: Supple, thyroid normal. No bruits, nodes or JVD. Respiratory: Respiratory effort normal.  BS equal and clear bilateral without rales, rhonci, wheezing or stridor. Cardio: Heart sounds are normal with regular rate and rhythm and no murmurs, rubs or gallops. Peripheral pulses are normal and equal bilaterally without edema. No aortic or femoral bruits. Chest: symmetric with normal excursions and percussion.  Abdomen: Soft, Obese with Nl bowel sounds. Nontender, no guarding, rebound, hernias, masses, or organomegaly.  Lymphatics: Non tender without lymphadenopathy.  2-3(+) - smooth & firm w/o nodules. Musculoskeletal: Full ROM all peripheral extremities, joint stability, 5/5 strength, and normal gait. Skin: Warm and dry without rashes, lesions, cyanosis, clubbing or  ecchymosis.  Neuro: Cranial nerves intact, reflexes equal bilaterally. Normal muscle tone, no cerebellar symptoms. Sensation decreased to touch, Vibratory, & Monofilament in a stocking distribution.   Pysch: Alert and oriented X 3 with normal affect, insight and judgment appropriate.   Assessment and Plan  1. Essential hypertension  - EKG 12-Lead - [place],  RETROPERITNL ABD,  LTD - TSH  2. Hyperlipidemia  - Lipid panel - TSH  3. T2_NIDDM w/CKD3 (GFR 40 ml/min)  - Microalbumin / creatinine urine ratio - HM DIABETES FOOT EXAM - LOW EXTREMITY NEUR EXAM DOCUM - Insulin, random  4. Vitamin D deficiency  - VITAMIN D 25 Hydroxy   5. Morbid obesity due to excess calories ([place])   6. Screening for rectal cancer  - POC Hemoccult Bld/Stl   7. Prostate cancer screening  - PSA  8. Medication management  - CBC with Differential/Platelet - BASIC METABOLIC PANEL WITH GFR - Hepatic function panel - Magnesium  9. Screening for AAA (aortic abdominal aneurysm)   10. Screening for ischemic heart disease   Continue prudent diet as discussed, weight  control, BP monitoring, regular exercise, and medications as discussed.  Discussed med effects and SE's. Routine screening labs and tests as requested with regular follow-up as recommended. Over 40 minutes of exam, counseling, chart review and high complex critical decision making was performed

## 2016-01-18 NOTE — Addendum Note (Signed)
Addended by: Unk Pinto on: 01/18/2016 07:12 PM   Modules accepted: Orders, Medications, Level of Service

## 2016-01-19 LAB — URINALYSIS, ROUTINE W REFLEX MICROSCOPIC
Bilirubin Urine: NEGATIVE
Glucose, UA: NEGATIVE
Hgb urine dipstick: NEGATIVE
Ketones, ur: NEGATIVE
Leukocytes, UA: NEGATIVE
Nitrite: NEGATIVE
Protein, ur: NEGATIVE
Specific Gravity, Urine: 1.019 (ref 1.001–1.035)
pH: 6 (ref 5.0–8.0)

## 2016-01-19 LAB — MICROALBUMIN / CREATININE URINE RATIO
Creatinine, Urine: 144 mg/dL (ref 20–370)
Microalb Creat Ratio: 6 mcg/mg creat (ref <30)
Microalb, Ur: 0.8 mg/dL

## 2016-01-19 LAB — PSA: PSA: 1.33 ng/mL (ref <=4.00)

## 2016-01-19 LAB — VITAMIN D 25 HYDROXY (VIT D DEFICIENCY, FRACTURES): Vit D, 25-Hydroxy: 44 ng/mL (ref 30–100)

## 2016-01-19 LAB — INSULIN, RANDOM: Insulin: 18 u[IU]/mL (ref 2.0–19.6)

## 2016-02-25 ENCOUNTER — Other Ambulatory Visit: Payer: Self-pay | Admitting: Internal Medicine

## 2016-03-03 ENCOUNTER — Other Ambulatory Visit: Payer: Self-pay | Admitting: *Deleted

## 2016-03-03 MED ORDER — PRAVASTATIN SODIUM 40 MG PO TABS: ORAL_TABLET | ORAL | Status: DC

## 2016-04-15 ENCOUNTER — Other Ambulatory Visit: Payer: Self-pay | Admitting: Physician Assistant

## 2016-04-24 ENCOUNTER — Ambulatory Visit: Payer: Self-pay | Admitting: Physician Assistant

## 2016-04-24 NOTE — Progress Notes (Deleted)
MEDICARE ANNUAL WELLNESS VISIT AND FOLLOW UP Assessment:   1. Essential hypertension - continue medications, DASH diet, exercise and monitor at home. Call if greater than 130/80.  - CBC with Differential/Platelet - BASIC METABOLIC PANEL WITH GFR - Hepatic function panel - TSH  2. 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 Continue weight loss - Hemoglobin A1c - Insulin, fasting - HM DIABETES FOOT EXAM  3. History of colonic polyps Given information about cologuard, declines colonoscopy at this time.   4. Hyperlipidemia -continue medications, check lipids, decrease fatty foods, increase activity. - Lipid panel  5. Vitamin D deficiency - Vit D  25 hydroxy (rtn osteoporosis monitoring)  6. Medication management - Magnesium  7. Morbid obesity (BMI 36.5) Obesity with co morbidities- long discussion about weight loss, diet, and exercise  8. Non compliance with medical treatment Has improved, will continue to monitor Long discussion about death/dying- will try to be more compliant with meds/diet  9. 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   Over 30 minutes of exam, counseling, chart review, and critical decision making was performed  Plan:   During the course of the visit the patient was educated and counseled about appropriate screening and preventive services including:    Pneumococcal vaccine   Influenza vaccine  Td vaccine  Screening electrocardiogram  Colorectal cancer screening  Diabetes screening  Glaucoma  screening  Nutrition counseling     Subjective:  Edwin Osborne is a 81 y.o. male who presents for Medicare Annual Wellness Visit and 3 month follow up for HTN, hyperlipidemia, diabetes, and vitamin D Def.   His blood pressure has been controlled at home, today their BP is   He does not workout. He denies chest pain, dizziness. Does have some SOB and fatigue with exercise/walking, states recovers quickly. No CP, diaphoresis, dizziness, nausea with it.  He is on cholesterol medication, he is off fenofibrate due to increase cost, he is on fish oil and pravastatin 40 and denies myalgias. His cholesterol is not at goal. The cholesterol last visit was:   Lab Results  Component Value Date   CHOL 138 01/18/2016   HDL 36 (L) 01/18/2016   LDLCALC 48 01/18/2016   TRIG 271 (H) 01/18/2016   CHOLHDL 3.8 01/18/2016   He has been working on diet and exercise for Diabetes with diabetic chronic kidney disease with neuropathy he is on bASA, he is on ACE/ARB, he does not check his sugars at home and denies polydipsia, polyuria and visual disturbances. Last A1C was:  Lab Results  Component Value Date   HGBA1C 8.0 (H) 11/26/2015  Patient is on Vitamin D supplement.   Lab Results  Component Value Date   VD25OH 44 01/18/2016   BMI is There is no height or weight on file to calculate BMI., he is working on diet and exercise. Wt Readings from Last 3 Encounters:  01/18/16 246 lb (111.6 kg)  11/29/15 250 lb (113.4 kg)  11/26/15 250 lb 7 oz (113.6 kg)  He is on cardura for BPH.   Medication Review: Current Outpatient Prescriptions on File Prior to Visit  Medication Sig Dispense Refill  . aspirin 81 MG tablet Take 1 tablet (81 mg total) by mouth  daily. Stop and restart on 09/04/11    . Cinnamon 500 MG TABS Take 1,000 mg by mouth.    . cyclobenzaprine (FLEXERIL) 10 MG tablet Take 1 tablet (10 mg total) by mouth every 8 (eight) hours as needed for muscle spasms. 30 tablet 1  . doxazosin (CARDURA) 8 MG  tablet TAKE 1 TABLET BY MOUTH FOR BLOOD PRESSURE / PROSTATE 90 tablet 1  . glipiZIDE (GLUCOTROL) 5 MG tablet Take 1 tablet (5 mg total) by mouth 2 (two) times daily. 180 tablet 3  . hydrochlorothiazide (HYDRODIURIL) 25 MG tablet TAKE 1 TABLET EVERY DAY FRO BLOOD PRESSURE AND FLUID 90 tablet 1  . lisinopril (PRINIVIL,ZESTRIL) 40 MG tablet TAKE 1 TABLET BY MOUTH DAILY FOR BLOOD PRESSURE AND DIABETIC KIDNEY PROTECTION 90 tablet 0  . metFORMIN (GLUCOPHAGE-XR) 500 MG 24 hr tablet TAKE 2 TABLETS BY MOUTH TWICE DAILY 360 tablet 0  . Multiple Vitamins-Minerals (MULTIVITAMIN WITH MINERALS) tablet Take 1 tablet by mouth daily.      . Nutritional Supplements (VITAMIN D MAINTENANCE PO) Take 10,000 mg by mouth daily.      Marland Kitchen omeprazole (PRILOSEC) 40 MG capsule TAKE 1 CAPSULE(40 MG) BY MOUTH DAILY (Patient taking differently: TAKE 1 CAPSULE(40 MG) BY MOUTH DAILY AS NEEDED) 90 capsule 0  . ondansetron (ZOFRAN ODT) 4 MG disintegrating tablet Take 1 tablet (4 mg total) by mouth every 8 (eight) hours as needed for nausea or vomiting. 20 tablet 0  . pravastatin (PRAVACHOL) 40 MG tablet TAKE 1 TABLET BY MOUTH AT BEDTIME FOR CHOLESTEROL 90 tablet 0  . Specialty Vitamins Products (MAGNESIUM, AMINO ACID CHELATE,) 133 MG tablet Take 1 tablet by mouth daily.       No current facility-administered medications on file prior to visit.     Current Problems (verified) Patient Active Problem List   Diagnosis Date Noted  . 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 Medication management 03/07/2014  . Non compliance with medical treatment 03/07/2014  . Morbid obesity (BMI 36.5) 03/07/2014  . Hyperlipidemia 08/15/2013  . Vitamin D deficiency 08/15/2013  . Hypertension   . History of colonic polyps 04/09/2004    Screening Tests Immunization History  Administered Date(s) Administered  . DT 08/31/2015  . Influenza Whole 05/24/2011, 08/06/2012, 07/07/2013  .  Influenza, High Dose Seasonal PF 06/21/2014, 08/31/2015  . Pneumococcal Conjugate-13 06/21/2014  . Pneumococcal Polysaccharide-23 03/10/2005   Preventative care: Last colonoscopy: 2012 due 2015  Prior vaccinations: TD or Tdap: 2016 Influenza: 2016 Pneumococcal: 2006 Prevnar13: 2015 Shingles/Zostavax: declines  Names of Other Physician/Practitioners you currently use: 1. [facility] here for primary care 2. [facility], eye doctor, last visit  3. none, dentist, last visit  Patient Care Team: Unk Pinto, MD as PCP - General (Internal Medicine)  Allergies No Known Allergies  SURGICAL HISTORY He  has a past surgical history that includes Tonsillectomy and adenoidectomy; Vein ligation and stripping; Knee arthroscopy; Colonoscopy (2005, 08/21/11); Fracture surgery; and Cholecystectomy (N/A, 11/29/2015). FAMILY HISTORY His family history is not on file. SOCIAL HISTORY He  reports that he quit smoking about 28 years ago. His smoking use included Cigars. He has never used smokeless tobacco. He reports that he drinks alcohol. He reports that he does not use drugs.   MEDICARE WELLNESS OBJECTIVES: Tobacco use: He does not smoke.  Patient is a former smoker. Alcohol Current alcohol use: social drinker Diet: getting better, has been trying to increase veggies, lose weight.  Physical  activity: sendentary work Depression/mood screen:   Depression screen PHQ 2/9 01/18/2016  Decreased Interest 0  Down, Depressed, Hopeless 0  PHQ - 2 Score 0   ADLs:  In your present state of health, do you have any difficulty performing the following activities: 01/18/2016 11/26/2015  Hearing? N N  Vision? N N  Difficulty concentrating or making decisions? N N  Walking or climbing stairs? N Y  Dressing or bathing? N N  Doing errands, shopping? N N  Some recent data might be hidden    Fall risk: Low Risk Cognitive Testing  Alert? Yes  Normal  Appearance?Yes  Oriented to person? Yes  Place? Yes   Time? Yes  Recall of three objects?  Yes  Can perform simple calculations? Yes  Displays appropriate judgment?Yes  Can read the correct time from a watch face?Yes  EOL planning:      Objective:   There were no vitals taken for this visit. There is no height or weight on file to calculate BMI.  General appearance: alert, no distress, WD/WN, male HEENT: normocephalic, sclerae anicteric, TMs pearly, nares patent, no discharge or erythema, pharynx normal Oral cavity: MMM, no lesions Neck: supple, no lymphadenopathy, no thyromegaly, no masses Heart: RRR, normal S1, S2, no murmurs Lungs: CTA bilaterally, no wheezes, rhonchi, or rales Abdomen: +bs, soft, obese, non tender, non distended, no masses, no hepatomegaly, no splenomegaly Musculoskeletal: nontender, no swelling, no obvious deformity Extremities: no edema, no cyanosis, no clubbing Pulses: 2+ symmetric, upper and lower extremities, normal cap refill Neurological: alert, oriented x 3, CN2-12 intact, strength normal upper extremities and lower extremities, sensation normal throughout, DTRs 2+ throughout, no cerebellar signs, gait normal Psychiatric: normal affect, behavior normal, pleasant   Medicare Attestation I have personally reviewed: The patient's medical and social history Their use of alcohol, tobacco or illicit drugs Their current medications and supplements The patient's functional ability including ADLs,fall risks, home safety risks, cognitive, and hearing and visual impairment Diet and physical activities Evidence for depression or mood disorders  The patient's weight, height, BMI, and visual acuity have been recorded in the chart.  I have made referrals, counseling, and provided education to the patient based on review of the above and I have provided the patient with a written personalized care plan for preventive services.     Vicie Mutters, PA-C   04/24/2016

## 2016-05-27 ENCOUNTER — Other Ambulatory Visit: Payer: Self-pay | Admitting: Internal Medicine

## 2016-06-03 ENCOUNTER — Encounter: Payer: Self-pay | Admitting: Physician Assistant

## 2016-06-03 ENCOUNTER — Ambulatory Visit (INDEPENDENT_AMBULATORY_CARE_PROVIDER_SITE_OTHER): Payer: Medicare Other | Admitting: Physician Assistant

## 2016-06-03 VITALS — BP 132/74 | HR 83 | Temp 97.5°F | Resp 16 | Ht 69.0 in | Wt 251.4 lb

## 2016-06-03 DIAGNOSIS — Z79899 Other long term (current) drug therapy: Secondary | ICD-10-CM | POA: Diagnosis not present

## 2016-06-03 DIAGNOSIS — R6889 Other general symptoms and signs: Secondary | ICD-10-CM

## 2016-06-03 DIAGNOSIS — Z86010 Personal history of colonic polyps: Secondary | ICD-10-CM

## 2016-06-03 DIAGNOSIS — Z23 Encounter for immunization: Secondary | ICD-10-CM

## 2016-06-03 DIAGNOSIS — Z0001 Encounter for general adult medical examination with abnormal findings: Secondary | ICD-10-CM

## 2016-06-03 DIAGNOSIS — I10 Essential (primary) hypertension: Secondary | ICD-10-CM

## 2016-06-03 DIAGNOSIS — E559 Vitamin D deficiency, unspecified: Secondary | ICD-10-CM

## 2016-06-03 DIAGNOSIS — E1142 Type 2 diabetes mellitus with diabetic polyneuropathy: Secondary | ICD-10-CM | POA: Diagnosis not present

## 2016-06-03 DIAGNOSIS — E782 Mixed hyperlipidemia: Secondary | ICD-10-CM

## 2016-06-03 DIAGNOSIS — E1121 Type 2 diabetes mellitus with diabetic nephropathy: Secondary | ICD-10-CM | POA: Diagnosis not present

## 2016-06-03 DIAGNOSIS — Z91199 Patient's noncompliance with other medical treatment and regimen due to unspecified reason: Secondary | ICD-10-CM

## 2016-06-03 DIAGNOSIS — Z9119 Patient's noncompliance with other medical treatment and regimen: Secondary | ICD-10-CM

## 2016-06-03 DIAGNOSIS — Z0000 Encounter for general adult medical examination without abnormal findings: Secondary | ICD-10-CM

## 2016-06-03 LAB — HEPATIC FUNCTION PANEL
ALT: 22 U/L (ref 9–46)
AST: 24 U/L (ref 10–35)
Albumin: 4.3 g/dL (ref 3.6–5.1)
Alkaline Phosphatase: 47 U/L (ref 40–115)
Bilirubin, Direct: 0.1 mg/dL (ref <=0.2)
Indirect Bilirubin: 0.4 mg/dL (ref 0.2–1.2)
Total Bilirubin: 0.5 mg/dL (ref 0.2–1.2)
Total Protein: 6.6 g/dL (ref 6.1–8.1)

## 2016-06-03 LAB — CBC WITH DIFFERENTIAL/PLATELET
Basophils Absolute: 0 cells/uL (ref 0–200)
Basophils Relative: 0 %
Eosinophils Absolute: 231 cells/uL (ref 15–500)
Eosinophils Relative: 3 %
HCT: 41.1 % (ref 38.5–50.0)
Hemoglobin: 13.6 g/dL (ref 13.2–17.1)
Lymphocytes Relative: 20 %
Lymphs Abs: 1540 cells/uL (ref 850–3900)
MCH: 29.4 pg (ref 27.0–33.0)
MCHC: 33.1 g/dL (ref 32.0–36.0)
MCV: 89 fL (ref 80.0–100.0)
MPV: 10.3 fL (ref 7.5–12.5)
Monocytes Absolute: 539 cells/uL (ref 200–950)
Monocytes Relative: 7 %
Neutro Abs: 5390 cells/uL (ref 1500–7800)
Neutrophils Relative %: 70 %
Platelets: 197 10*3/uL (ref 140–400)
RBC: 4.62 MIL/uL (ref 4.20–5.80)
RDW: 14.4 % (ref 11.0–15.0)
WBC: 7.7 10*3/uL (ref 3.8–10.8)

## 2016-06-03 LAB — LIPID PANEL
Cholesterol: 166 mg/dL (ref 125–200)
HDL: 41 mg/dL (ref >=40)
Total CHOL/HDL Ratio: 4 Ratio (ref <=5.0)
Triglycerides: 403 mg/dL — ABNORMAL HIGH (ref <150)

## 2016-06-03 LAB — BASIC METABOLIC PANEL WITH GFR
BUN: 25 mg/dL (ref 7–25)
CO2: 25 mmol/L (ref 20–31)
Calcium: 9.4 mg/dL (ref 8.6–10.3)
Chloride: 102 mmol/L (ref 98–110)
Creat: 1.57 mg/dL — ABNORMAL HIGH (ref 0.70–1.18)
GFR, Est African American: 48 mL/min — ABNORMAL LOW (ref >=60)
GFR, Est Non African American: 41 mL/min — ABNORMAL LOW (ref >=60)
Glucose, Bld: 185 mg/dL — ABNORMAL HIGH (ref 65–99)
Potassium: 4.6 mmol/L (ref 3.5–5.3)
Sodium: 139 mmol/L (ref 135–146)

## 2016-06-03 NOTE — Patient Instructions (Signed)
Please pick one of the over the counter allergy medications below and take it once daily for allergies.  Claritin or loratadine cheapest but likely the weakest  Cheapest at [employer], [employer], [employer]     Bad carbs also include fruit juice, alcohol, and sweet tea. These are empty calories that do not signal to your brain that you are full.   Please remember the good carbs are still carbs which convert into sugar. So please measure them out no more than 1/2-1 cup of rice, oatmeal, pasta, and beans  Veggies are however free foods! Pile them on.   Not all fruit is created equal. Please see the list below, the fruit at the bottom is higher in sugars than the fruit at the top. Please avoid all dried fruits.

## 2016-06-03 NOTE — Progress Notes (Signed)
MEDICARE ANNUAL WELLNESS VISIT AND FOLLOW UP Assessment:   1. Essential hypertension - continue medications, DASH diet, exercise and monitor at home. Call if greater than 130/80.  - CBC with Differential/Platelet - BASIC METABOLIC PANEL WITH GFR - Hepatic function panel - TSH  2. 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 Continue weight loss - Hemoglobin A1c - Insulin, fasting - HM DIABETES FOOT EXAM  3. History of colonic polyps Given information about cologuard, declines colonoscopy at this time, agree due to age   60. Hyperlipidemia -continue medications, check lipids, decrease fatty foods, increase activity. - Lipid panel  5. Vitamin D deficiency - Vit D  25 hydroxy (rtn osteoporosis monitoring)  6. Medication management - Magnesium  7. Morbid obesity (BMI 36.5) Obesity with co morbidities- long discussion about weight loss, diet, and exercise  8. Non compliance with medical treatment Long discussion about death/dying- will try to be more compliant with meds/diet   Over 30 minutes of exam, counseling, chart review, and critical decision making was performed  Plan:   During the course of the visit the patient was educated and counseled about appropriate screening and preventive services including:    Pneumococcal vaccine   Influenza vaccine  Td vaccine  Screening electrocardiogram  Colorectal cancer screening  Diabetes screening  Glaucoma screening  Nutrition counseling    Subjective:  Edwin Osborne is a 81 y.o. male who presents for Medicare Annual Wellness Visit and 3 month follow up for HTN, hyperlipidemia, diabetes, and vitamin D Def.   His blood pressure has been controlled at home, today their BP is BP: 132/74 He does not  workout. He denies chest pain, dizziness, SOB.  He is on cholesterol medication, he is off fenofibrate due to increase cost, he is on fish oil and pravastatin 40 and denies myalgias. His cholesterol is not at goal. The cholesterol last visit was:   Lab Results  Component Value Date   CHOL 138 01/18/2016   HDL 36 (L) 01/18/2016   LDLCALC 48 01/18/2016   TRIG 271 (H) 01/18/2016   CHOLHDL 3.8 01/18/2016   He has been working on diet and exercise for Diabetes with diabetic chronic kidney disease, he is on bASA, he is on ACE/ARB, he does not check his sugars at home and denies paresthesia of the feet, polydipsia, polyuria and visual disturbances. Last A1C was:  Lab Results  Component Value Date   HGBA1C 8.0 (H) 11/26/2015  Patient is on Vitamin D supplement.   Lab Results  Component Value Date   VD25OH 44 01/18/2016   BMI is Body mass index is 37.13 kg/m., he is working on diet and exercise. Wt Readings from Last 3 Encounters:  06/03/16 251 lb 6.4 oz (114 kg)  01/18/16 246 lb (111.6 kg)  11/29/15 250 lb (113.4 kg)  He is on cardura for BPH.   Medication Review: Current Outpatient Prescriptions on File Prior to Visit  Medication Sig Dispense Refill  . aspirin 81 MG tablet Take 1 tablet (81 mg total) by mouth daily. Stop and restart on 09/04/11    . Cinnamon 500 MG TABS Take 1,000 mg by mouth.    . doxazosin (CARDURA) 8 MG tablet TAKE 1 TABLET BY MOUTH FOR BLOOD PRESSURE / PROSTATE 90 tablet 1  . glipiZIDE (GLUCOTROL) 5 MG tablet Take 1 tablet (5 mg total) by mouth  2 (two) times daily. 180 tablet 3  . hydrochlorothiazide (HYDRODIURIL) 25 MG tablet TAKE 1 TABLET EVERY DAY FRO BLOOD PRESSURE AND FLUID 90 tablet 1  . lisinopril (PRINIVIL,ZESTRIL) 40 MG tablet TAKE 1 TABLET BY MOUTH DAILY FOR BLOOD PRESSURE AND DIABETIC KIDNEY PROTECTION 90 tablet 1  . metFORMIN (GLUCOPHAGE-XR) 500 MG 24 hr tablet TAKE 2 TABLETS BY MOUTH TWICE DAILY 360 tablet 0  . Multiple Vitamins-Minerals (MULTIVITAMIN  WITH MINERALS) tablet Take 1 tablet by mouth daily.      . Nutritional Supplements (VITAMIN D MAINTENANCE PO) Take 10,000 mg by mouth daily.      . pravastatin (PRAVACHOL) 40 MG tablet TAKE 1 TABLET BY MOUTH AT BEDTIME FOR CHOLESTEROL 90 tablet 0  . Specialty Vitamins Products (MAGNESIUM, AMINO ACID CHELATE,) 133 MG tablet Take 1 tablet by mouth daily.       No current facility-administered medications on file prior to visit.     Current Problems (verified) Patient Active Problem List   Diagnosis Date Noted  . 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 Medication management 03/07/2014  . Non compliance with medical treatment 03/07/2014  . Morbid obesity (BMI 36.5) 03/07/2014  . Hyperlipidemia 08/15/2013  . Vitamin D deficiency 08/15/2013  . Hypertension   . History of colonic polyps 04/09/2004    Screening Tests Immunization History  Administered Date(s) Administered  . DT 08/31/2015  . Influenza Whole 05/24/2011, 08/06/2012, 07/07/2013  . Influenza, High Dose Seasonal PF 06/21/2014, 08/31/2015, 06/03/2016  . Pneumococcal Conjugate-13 06/21/2014  . Pneumococcal Polysaccharide-23 03/10/2005   Preventative care: Last colonoscopy: 2012 due 2015 but will not have another, declines  Prior vaccinations: TD or Tdap: 2016 Influenza: 2016 TODAY Pneumococcal: 2006 Prevnar13: 2015 Shingles/Zostavax: declines  Names of Other Physician/Practitioners you currently use: 1. [facility] here for primary care 2. [facility], eye doctor, last visit 2015, wants referral 3. none, dentist, last visit 2015 Patient Care Team: Unk Pinto, MD as PCP - General (Internal Medicine)  Allergies No Known Allergies  SURGICAL HISTORY He  has a past surgical history that includes Tonsillectomy and adenoidectomy; Vein ligation and stripping; Knee arthroscopy; Colonoscopy (2005, 08/21/11); Fracture surgery;  and Cholecystectomy (N/A, 11/29/2015). FAMILY HISTORY His family history is not on file. SOCIAL HISTORY He  reports that he quit smoking about 28 years ago. His smoking use included Cigars. He has never used smokeless tobacco. He reports that he drinks alcohol. He reports that he does not use drugs.  MEDICARE WELLNESS OBJECTIVES: Tobacco use: He does not smoke.  Patient is a former smoker. Alcohol Current alcohol use: social drinker Caffeine Current caffeine use: coffee 2 /day Osteoporosis: dietary calcium and/or vitamin D deficiency, History of fracture in the past year: no Diet: getting better, has been trying to increase veggies, lose weight.  Physical activity: sendentary work Depression/mood screen:   Depression screen PHQ 2/9 01/18/2016  Decreased Interest 0  Down, Depressed, Hopeless 0  PHQ - 2 Score 0   Hearing: normal Visual acuity: impaired,  does not perform annual eye exam  ADLs:  In your present state of health, do you have any difficulty performing the following activities: 01/18/2016 11/26/2015  Hearing? N N  Vision? N N  Difficulty concentrating or making decisions? N N  Walking or climbing stairs? N Y  Dressing or bathing? N N  Doing errands, shopping? N N  Some recent data might be hidden    Fall risk: Low Risk Cognitive  Testing  Alert? Yes  Normal Appearance?Yes  Oriented to person? Yes  Place? Yes   Time? Yes  Recall of three objects?  Yes  Can perform simple calculations? Yes  Displays appropriate judgment?Yes  Can read the correct time from a watch face?Yes  EOL planning: Does patient have an advance directive?: No Would patient like information on creating an advanced directive?: Yes - Educational materials given    Objective:   Blood pressure 132/74, pulse 83, temperature 97.5 F (36.4 C), resp. rate 16, height 5\' 9"  (1.753 m), weight 251 lb 6.4 oz (114 kg), SpO2 96 %. Body mass index is 37.13 kg/m.  General appearance: alert, no distress, WD/WN,  male HEENT: normocephalic, sclerae anicteric, TMs pearly, nares patent, no discharge or erythema, pharynx normal Oral cavity: MMM, no lesions Neck: supple, no lymphadenopathy, no thyromegaly, no masses Heart: RRR, normal S1, S2, no murmurs Lungs: CTA bilaterally, no wheezes, rhonchi, or rales Abdomen: +bs, soft, obese, non tender, non distended, no masses, no hepatomegaly, no splenomegaly Musculoskeletal: nontender, no swelling, no obvious deformity Extremities: no edema, no cyanosis, no clubbing Pulses: 2+ symmetric, upper and lower extremities, normal cap refill Neurological: alert, oriented x 3, CN2-12 intact, strength normal upper extremities and lower extremities, sensation normal throughout, DTRs 2+ throughout, no cerebellar signs, gait normal Psychiatric: normal affect, behavior normal, pleasant   Medicare Attestation I have personally reviewed: The patient's medical and social history Their use of alcohol, tobacco or illicit drugs Their current medications and supplements The patient's functional ability including ADLs,fall risks, home safety risks, cognitive, and hearing and visual impairment Diet and physical activities Evidence for depression or mood disorders  The patient's weight, height, BMI, and visual acuity have been recorded in the chart.  I have made referrals, counseling, and provided education to the patient based on review of the above and I have provided the patient with a written personalized care plan for preventive services.     Vicie Mutters, PA-C   06/03/2016

## 2016-06-04 LAB — TSH: TSH: 2.81 mIU/L (ref 0.40–4.50)

## 2016-06-04 LAB — HEMOGLOBIN A1C
Hgb A1c MFr Bld: 8.4 % — ABNORMAL HIGH (ref <5.7)
Mean Plasma Glucose: 194 mg/dL

## 2016-08-22 ENCOUNTER — Other Ambulatory Visit: Payer: Self-pay | Admitting: Physician Assistant

## 2016-08-22 ENCOUNTER — Ambulatory Visit (INDEPENDENT_AMBULATORY_CARE_PROVIDER_SITE_OTHER): Payer: Medicare Other | Admitting: Physician Assistant

## 2016-08-22 ENCOUNTER — Encounter: Payer: Self-pay | Admitting: Physician Assistant

## 2016-08-22 VITALS — BP 130/78 | HR 82 | Temp 97.7°F | Resp 14 | Ht 69.0 in | Wt 254.6 lb

## 2016-08-22 DIAGNOSIS — R1013 Epigastric pain: Secondary | ICD-10-CM | POA: Diagnosis not present

## 2016-08-22 DIAGNOSIS — E1121 Type 2 diabetes mellitus with diabetic nephropathy: Secondary | ICD-10-CM

## 2016-08-22 LAB — CBC WITH DIFFERENTIAL/PLATELET
Basophils Absolute: 0 cells/uL (ref 0–200)
Basophils Relative: 0 %
Eosinophils Absolute: 350 cells/uL (ref 15–500)
Eosinophils Relative: 5 %
HCT: 40 % (ref 38.5–50.0)
Hemoglobin: 12.9 g/dL — ABNORMAL LOW (ref 13.2–17.1)
Lymphocytes Relative: 24 %
Lymphs Abs: 1680 cells/uL (ref 850–3900)
MCH: 29.2 pg (ref 27.0–33.0)
MCHC: 32.3 g/dL (ref 32.0–36.0)
MCV: 90.5 fL (ref 80.0–100.0)
MPV: 10.5 fL (ref 7.5–12.5)
Monocytes Absolute: 700 cells/uL (ref 200–950)
Monocytes Relative: 10 %
Neutro Abs: 4270 cells/uL (ref 1500–7800)
Neutrophils Relative %: 61 %
Platelets: 175 10*3/uL (ref 140–400)
RBC: 4.42 MIL/uL (ref 4.20–5.80)
RDW: 13.7 % (ref 11.0–15.0)
WBC: 7 10*3/uL (ref 3.8–10.8)

## 2016-08-22 LAB — BASIC METABOLIC PANEL WITH GFR
BUN: 26 mg/dL — ABNORMAL HIGH (ref 7–25)
CO2: 25 mmol/L (ref 20–31)
Calcium: 9.2 mg/dL (ref 8.6–10.3)
Chloride: 104 mmol/L (ref 98–110)
Creat: 1.7 mg/dL — ABNORMAL HIGH (ref 0.70–1.18)
GFR, Est African American: 43 mL/min — ABNORMAL LOW (ref >=60)
GFR, Est Non African American: 38 mL/min — ABNORMAL LOW (ref >=60)
Glucose, Bld: 151 mg/dL — ABNORMAL HIGH (ref 65–99)
Potassium: 4.6 mmol/L (ref 3.5–5.3)
Sodium: 139 mmol/L (ref 135–146)

## 2016-08-22 LAB — HEPATIC FUNCTION PANEL
ALT: 18 U/L (ref 9–46)
AST: 18 U/L (ref 10–35)
Albumin: 4 g/dL (ref 3.6–5.1)
Alkaline Phosphatase: 44 U/L (ref 40–115)
Bilirubin, Direct: 0.1 mg/dL (ref <=0.2)
Indirect Bilirubin: 0.3 mg/dL (ref 0.2–1.2)
Total Bilirubin: 0.4 mg/dL (ref 0.2–1.2)
Total Protein: 6.3 g/dL (ref 6.1–8.1)

## 2016-08-22 MED ORDER — AZITHROMYCIN 250 MG PO TABS: ORAL_TABLET | ORAL | 1 refills | Status: AC

## 2016-08-22 MED ORDER — PANTOPRAZOLE SODIUM 40 MG PO TBEC: 40.0000 mg | DELAYED_RELEASE_TABLET | Freq: Every day | ORAL | 1 refills | Status: DC

## 2016-08-22 NOTE — Progress Notes (Signed)
Subjective:    Patient ID: Edwin Osborne, male    DOB: 03/23/1935, 81 y.o.   MRN: [id number]  HPI 81 y.o. obese WM with DM2 presents with AB pain x 3 days. He has been having left upper quadrant burning pain, very pin point tenderness. States worse with food.  Has been coughing for last 2 weeks but has been better last few days, has been using nasonex. Denies fever or chills, no nausea, bad taste in the back of his mouth. Has had constipation x 1-2 days. Took pepto yesterday, helped some. Denies NSAIDS, has had some ETOH in the last month but states just wine occ.   Blood pressure 130/78, pulse 82, temperature 97.7 F (36.5 C), resp. rate 14, height 5\' 9"  (1.753 m), weight 254 lb 9.6 oz (115.5 kg), SpO2 97 %.  Medications Current Outpatient Prescriptions on File Prior to Visit  Medication Sig  . aspirin 81 MG tablet Take 1 tablet (81 mg total) by mouth daily. Stop and restart on 09/04/11  . Cinnamon 500 MG TABS Take 1,000 mg by mouth.  . doxazosin (CARDURA) 8 MG tablet TAKE 1 TABLET BY MOUTH FOR BLOOD PRESSURE / PROSTATE  . glipiZIDE (GLUCOTROL) 5 MG tablet Take 1 tablet (5 mg total) by mouth 2 (two) times daily.  . hydrochlorothiazide (HYDRODIURIL) 25 MG tablet TAKE 1 TABLET EVERY DAY FRO BLOOD PRESSURE AND FLUID  . lisinopril (PRINIVIL,ZESTRIL) 40 MG tablet TAKE 1 TABLET BY MOUTH DAILY FOR BLOOD PRESSURE AND DIABETIC KIDNEY PROTECTION  . metFORMIN (GLUCOPHAGE-XR) 500 MG 24 hr tablet TAKE 2 TABLETS BY MOUTH TWICE DAILY  . Multiple Vitamins-Minerals (MULTIVITAMIN WITH MINERALS) tablet Take 1 tablet by mouth daily.    . Nutritional Supplements (VITAMIN D MAINTENANCE PO) Take 10,000 mg by mouth daily.    . pravastatin (PRAVACHOL) 40 MG tablet TAKE 1 TABLET BY MOUTH AT BEDTIME FOR CHOLESTEROL  . Specialty Vitamins Products (MAGNESIUM, AMINO ACID CHELATE,) 133 MG tablet Take 1 tablet by mouth daily.     No current facility-administered medications on file prior to visit.     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  Review of Systems  Constitutional: Positive for fatigue. Negative for appetite change, chills and fever.  HENT: Negative.   Respiratory: Negative.   Cardiovascular: Negative.   Gastrointestinal: Positive for abdominal distention, abdominal pain and constipation. Negative for anal bleeding, blood in stool, diarrhea, nausea, rectal pain and vomiting.  Genitourinary: Negative for decreased urine volume, difficulty urinating, discharge, dysuria, enuresis, flank pain, frequency, genital sores, hematuria, penile pain, penile swelling, scrotal swelling, testicular pain and urgency.  Musculoskeletal: Positive for back pain. Negative for arthralgias, gait problem, joint swelling, myalgias, neck pain and neck stiffness.  Skin: Negative.   Neurological: Negative.        Objective:   Physical Exam  Constitutional: He is oriented to person, place, and time. He appears well-developed and well-nourished.  HENT:  Head: Normocephalic and atraumatic.  Right Ear: External ear normal.  Left Ear: External ear normal.  Mouth/Throat: Oropharynx is clear and moist.  Eyes: Conjunctivae and EOM are normal. Pupils are equal, round, and reactive to light.  Neck: Normal range of motion. Neck supple.  Cardiovascular: Normal rate, regular rhythm and normal heart sounds.   Pulmonary/Chest: Effort normal and breath sounds normal.  Abdominal:  Soft. Bowel sounds are normal. He exhibits no mass. There is tenderness (epigastric and LUQ). There is no rebound and no guarding.  Musculoskeletal: Normal range of motion. He exhibits no tenderness (along chest wall).  Neurological: He is alert and oriented to person, place, and time. No cranial nerve  deficit.  Skin: Skin is warm and dry. No rash noted.       Assessment & Plan:  Epigastric pain ? GERD, gastroparesis, some mild LLQ pain, no fever, chills.  Stop wine, no NSAIDS, will start on protonix Check labs, CBC, CMET  Future Appointments Date Time Provider [place]  09/05/2016 10:00 AM Starlyn Skeans, PA-C [facility] None  02/20/2017 10:00 AM Unk Pinto, MD [facility] None

## 2016-08-22 NOTE — Patient Instructions (Signed)
Heartburn Heartburn is a type of pain or discomfort that can happen in the throat or chest. It is often described as a burning pain. It may also cause a bad taste in the mouth. Heartburn may feel worse when you lie down or bend over, and it is often worse at night. Heartburn may be caused by stomach contents that move back up into the esophagus (reflux). Follow these instructions at home: Take these actions to decrease your discomfort and to help avoid complications. Diet  Follow a diet as recommended by your health care provider. This may involve avoiding foods and drinks such as: ? Coffee and tea (with or without caffeine). ? Drinks that contain alcohol. ? Energy drinks and sports drinks. ? Carbonated drinks or sodas. ? Chocolate and cocoa. ? Peppermint and mint flavorings. ? Garlic and onions. ? Horseradish. ? Spicy and acidic foods, including peppers, chili powder, curry powder, vinegar, hot sauces, and barbecue sauce. ? Citrus fruit juices and citrus fruits, such as oranges, lemons, and limes. ? Tomato-based foods, such as red sauce, chili, salsa, and pizza with red sauce. ? Fried and fatty foods, such as donuts, french fries, potato chips, and high-fat dressings. ? High-fat meats, such as hot dogs and fatty cuts of red and white meats, such as rib eye steak, sausage, ham, and bacon. ? High-fat dairy items, such as whole milk, butter, and cream cheese.  Eat small, frequent meals instead of large meals.  Avoid drinking large amounts of liquid with your meals.  Avoid eating meals during the 2-3 hours before bedtime.  Avoid lying down right after you eat.  Do not exercise right after you eat. General instructions  Pay attention to any changes in your symptoms.  Take over-the-counter and prescription medicines only as told by your health care provider. Do not take aspirin, ibuprofen, or other NSAIDs unless your health care provider told you to do so.  Do not use any tobacco  products, including cigarettes, chewing tobacco, and e-cigarettes. If you need help quitting, ask your health care provider.  Wear loose-fitting clothing. Do not wear anything tight around your waist that causes pressure on your abdomen.  Raise (elevate) the head of your bed about 6 inches (15 cm).  Try to reduce your stress, such as with yoga or meditation. If you need help reducing stress, ask your health care provider.  If you are overweight, reduce your weight to an amount that is healthy for you. Ask your health care provider for guidance about a safe weight loss goal.  Keep all follow-up visits as told by your health care provider. This is important. Contact a health care provider if:  You have new symptoms.  You have unexplained weight loss.  You have difficulty swallowing, or it hurts to swallow.  You have wheezing or a persistent cough.  Your symptoms do not improve with treatment.  You have frequent heartburn for more than two weeks. Get help right away if:  You have pain in your arms, neck, jaw, teeth, or back.  You feel sweaty, dizzy, or light-headed.  You have chest pain or shortness of breath.  You vomit and your vomit looks like blood or coffee grounds.  Your stool is bloody or black. This information is not intended to replace advice given to you by your health care provider. Make sure you discuss any questions you have with your health care provider. Document Released: 01/25/2009 Document Revised: 02/14/2016 Document Reviewed: 01/03/2015 Elsevier Interactive Patient Education  2017 [employer]   Inc[employer]  

## 2016-09-05 ENCOUNTER — Ambulatory Visit (INDEPENDENT_AMBULATORY_CARE_PROVIDER_SITE_OTHER): Payer: Medicare Other | Admitting: Internal Medicine

## 2016-09-05 ENCOUNTER — Encounter: Payer: Self-pay | Admitting: Internal Medicine

## 2016-09-05 VITALS — BP 128/76 | HR 88 | Temp 98.4°F | Resp 18 | Ht 69.0 in | Wt 252.0 lb

## 2016-09-05 DIAGNOSIS — E782 Mixed hyperlipidemia: Secondary | ICD-10-CM | POA: Diagnosis not present

## 2016-09-05 DIAGNOSIS — E559 Vitamin D deficiency, unspecified: Secondary | ICD-10-CM

## 2016-09-05 DIAGNOSIS — R0602 Shortness of breath: Secondary | ICD-10-CM | POA: Diagnosis not present

## 2016-09-05 DIAGNOSIS — R609 Edema, unspecified: Secondary | ICD-10-CM | POA: Diagnosis not present

## 2016-09-05 DIAGNOSIS — E1121 Type 2 diabetes mellitus with diabetic nephropathy: Secondary | ICD-10-CM | POA: Diagnosis not present

## 2016-09-05 DIAGNOSIS — Z79899 Other long term (current) drug therapy: Secondary | ICD-10-CM

## 2016-09-05 DIAGNOSIS — I10 Essential (primary) hypertension: Secondary | ICD-10-CM | POA: Diagnosis not present

## 2016-09-05 DIAGNOSIS — E1142 Type 2 diabetes mellitus with diabetic polyneuropathy: Secondary | ICD-10-CM

## 2016-09-05 LAB — CBC WITH DIFFERENTIAL/PLATELET
Basophils Absolute: 0 cells/uL (ref 0–200)
Basophils Relative: 0 %
Eosinophils Absolute: 268 cells/uL (ref 15–500)
Eosinophils Relative: 4 %
HCT: 40.1 % (ref 38.5–50.0)
Hemoglobin: 13.3 g/dL (ref 13.2–17.1)
Lymphocytes Relative: 20 %
Lymphs Abs: 1340 cells/uL (ref 850–3900)
MCH: 29.2 pg (ref 27.0–33.0)
MCHC: 33.2 g/dL (ref 32.0–36.0)
MCV: 88.1 fL (ref 80.0–100.0)
MPV: 10.3 fL (ref 7.5–12.5)
Monocytes Absolute: 737 cells/uL (ref 200–950)
Monocytes Relative: 11 %
Neutro Abs: 4355 cells/uL (ref 1500–7800)
Neutrophils Relative %: 65 %
Platelets: 174 10*3/uL (ref 140–400)
RBC: 4.55 MIL/uL (ref 4.20–5.80)
RDW: 13.8 % (ref 11.0–15.0)
WBC: 6.7 10*3/uL (ref 3.8–10.8)

## 2016-09-05 LAB — HEPATIC FUNCTION PANEL
ALT: 15 U/L (ref 9–46)
AST: 18 U/L (ref 10–35)
Albumin: 4.1 g/dL (ref 3.6–5.1)
Alkaline Phosphatase: 52 U/L (ref 40–115)
Bilirubin, Direct: 0.1 mg/dL (ref <=0.2)
Indirect Bilirubin: 0.3 mg/dL (ref 0.2–1.2)
Total Bilirubin: 0.4 mg/dL (ref 0.2–1.2)
Total Protein: 6.3 g/dL (ref 6.1–8.1)

## 2016-09-05 LAB — BASIC METABOLIC PANEL WITH GFR
BUN: 20 mg/dL (ref 7–25)
CO2: 27 mmol/L (ref 20–31)
Calcium: 9.2 mg/dL (ref 8.6–10.3)
Chloride: 104 mmol/L (ref 98–110)
Creat: 1.73 mg/dL — ABNORMAL HIGH (ref 0.70–1.18)
GFR, Est African American: 42 mL/min — ABNORMAL LOW (ref >=60)
GFR, Est Non African American: 37 mL/min — ABNORMAL LOW (ref >=60)
Glucose, Bld: 183 mg/dL — ABNORMAL HIGH (ref 65–99)
Potassium: 4.6 mmol/L (ref 3.5–5.3)
Sodium: 141 mmol/L (ref 135–146)

## 2016-09-05 LAB — HEMOGLOBIN A1C
Hgb A1c MFr Bld: 7.9 % — ABNORMAL HIGH (ref <5.7)
Mean Plasma Glucose: 180 mg/dL

## 2016-09-05 LAB — LIPID PANEL
Cholesterol: 140 mg/dL (ref <200)
HDL: 34 mg/dL — ABNORMAL LOW (ref >40)
LDL Cholesterol: 45 mg/dL (ref <100)
Total CHOL/HDL Ratio: 4.1 Ratio (ref <5.0)
Triglycerides: 307 mg/dL — ABNORMAL HIGH (ref <150)
VLDL: 61 mg/dL — ABNORMAL HIGH (ref <30)

## 2016-09-05 LAB — TSH: TSH: 2.4 mIU/L (ref 0.40–4.50)

## 2016-09-05 MED ORDER — AMOXICILLIN-POT CLAVULANATE 875-125 MG PO TABS: 1.0000 | ORAL_TABLET | Freq: Two times a day (BID) | ORAL | 0 refills | Status: DC

## 2016-09-05 MED ORDER — RANITIDINE HCL 300 MG PO TABS: 300.0000 mg | ORAL_TABLET | Freq: Every day | ORAL | 1 refills | Status: DC

## 2016-09-05 NOTE — Patient Instructions (Signed)
Please start taking Augmentin twice daily with food until it is gone.  This will treat for diverticulitis and will also help to kill any bacteria causing irritation to the stomach lining.  Please take the ranitidine twice daily to help coat your stomach and to help with abdominal pain.  Please continue to take the protonix once daily on an empty stomach.  Wait 30 minutes prior to eating or taking any other medications.  Please avoid spicy foods, acidic foods, or spearmint/peppermint.    Please avoid salt in your diet.  Please try to cook with minimal salt.

## 2016-09-05 NOTE — Progress Notes (Signed)
Assessment and Plan:  Hypertension:  -Continue medication -monitor blood pressure at home. -Continue DASH diet -Reminder to go to the ER if any CP, SOB, nausea, dizziness, severe HA, changes vision/speech, left arm numbness and tingling and jaw pain.  Cholesterol - Continue diet and exercise -Check cholesterol.   Diabetes with diabetic chronic kidney disease -Continue diet and exercise.  -Check A1C  Vitamin D Def -check level -continue medications.   Abdominal pain -sounds like reflux -cont protonix -add in ranitidine BID -augentin for possibe H. Pylori coverage vs. Diverticulitis   Peripheral edema -compression socks -may need possible stronger diuretic -BNP for fatigue  Continue diet and meds as discussed. Further disposition pending results of labs. Discussed med's effects and SE's.    HPI 81 y.o. male  presents for 3 month follow up with hypertension, hyperlipidemia, diabetes and vitamin D deficiency.   His blood pressure has been controlled at home, today their BP is BP: 128/76.He does not workout. He denies chest pain, shortness of breath, dizziness.   He is on cholesterol medication and denies myalgias. His cholesterol is at goal. The cholesterol was:  06/03/2016: Cholesterol 166; HDL 41; LDL Cholesterol NOT CALC; Triglycerides 403   He has been working on diet and exercise for diabetes with diabetic chronic kidney disease, he is not on bASA, he is on ACE/ARB, and denies  foot ulcerations, hyperglycemia, hypoglycemia , increased appetite, nausea, paresthesia of the feet, polydipsia, polyuria, visual disturbances, vomiting and weight loss. Last A1C was: 06/03/2016: Hgb A1c MFr Bld 8.4   Patient is on Vitamin D supplement. 01/18/2016: Vit D, 25-Hydroxy 44  Patient reports that he has been having some left sided stomach pain and swelling.  He reports that he has been having some burning pain and it is intermittent.  He reports that it got somewhat better when he had a  zpak.  He reports that he was still taking protonix up until yesterday and this didn't help much.  He reports that he feels like he needs an antibiotic.  He does have trouble with swallowing where food feels backed up.  He does have some belching.  He does get water brash.  No melena, no hematochezia.  He reports that mostly he has some normal stools.  He had his gallbladder removed.  He reports that he has had some issues with diverticulitis in the past.    Current Medications:  Current Outpatient Prescriptions on File Prior to Visit  Medication Sig Dispense Refill  . aspirin 81 MG tablet Take 1 tablet (81 mg total) by mouth daily. Stop and restart on 09/04/11    . Cinnamon 500 MG TABS Take 1,000 mg by mouth.    . doxazosin (CARDURA) 8 MG tablet TAKE 1 TABLET BY MOUTH FOR BLOOD PRESSURE / PROSTATE 90 tablet 1  . glipiZIDE (GLUCOTROL) 5 MG tablet Take 1 tablet (5 mg total) by mouth 2 (two) times daily. 180 tablet 3  . hydrochlorothiazide (HYDRODIURIL) 25 MG tablet TAKE 1 TABLET EVERY DAY FRO BLOOD PRESSURE AND FLUID 90 tablet 1  . lisinopril (PRINIVIL,ZESTRIL) 40 MG tablet TAKE 1 TABLET BY MOUTH DAILY FOR BLOOD PRESSURE AND DIABETIC KIDNEY PROTECTION 90 tablet 1  . metFORMIN (GLUCOPHAGE-XR) 500 MG 24 hr tablet TAKE 2 TABLETS BY MOUTH TWICE DAILY 360 tablet 1  . Multiple Vitamins-Minerals (MULTIVITAMIN WITH MINERALS) tablet Take 1 tablet by mouth daily.      . Nutritional Supplements (VITAMIN D MAINTENANCE PO) Take 10,000 mg by mouth daily.      Marland Kitchen  pantoprazole (PROTONIX) 40 MG tablet TAKE 1 TABLET(40 MG) BY MOUTH DAILY 90 tablet 1  . pravastatin (PRAVACHOL) 40 MG tablet TAKE 1 TABLET BY MOUTH AT BEDTIME FOR CHOLESTEROL 90 tablet 0  . Specialty Vitamins Products (MAGNESIUM, AMINO ACID CHELATE,) 133 MG tablet Take 1 tablet by mouth daily.       No current facility-administered medications on file prior to visit.    Medical History:  Past Medical History:  Diagnosis Date  . Arthritis   . BPH  (benign prostatic hyperplasia)   . Cataract   . CKD (chronic kidney disease), stage III   . Depression    "usually a happy go lucky"  . Diabetes mellitus   . GERD (gastroesophageal reflux disease)    sometimes will take omeprazole  . Hyperlipidemia   . Hypertension    Allergies: No Known Allergies   Review of Systems:  Review of Systems  Constitutional: Negative for chills, fever and malaise/fatigue.  HENT: Negative for congestion, ear pain and sore throat.   Eyes: Negative.   Respiratory: Negative for cough, shortness of breath and wheezing.   Cardiovascular: Negative for chest pain, palpitations and leg swelling.  Gastrointestinal: Positive for abdominal pain and heartburn. Negative for blood in stool, constipation, diarrhea and melena.  Genitourinary: Negative.   Skin: Negative.   Neurological: Positive for dizziness. Negative for sensory change, loss of consciousness and headaches.  Psychiatric/Behavioral: Negative for depression. The patient is not nervous/anxious and does not have insomnia.     Family history- Review and unchanged  Social history- Review and unchanged  Physical Exam: BP 128/76   Pulse 88   Temp 98.4 F (36.9 C) (Temporal)   Resp 18   Ht 5\' 9"  (1.753 m)   Wt 252 lb (114.3 kg)   BMI 37.21 kg/m  Wt Readings from Last 3 Encounters:  09/05/16 252 lb (114.3 kg)  08/22/16 254 lb 9.6 oz (115.5 kg)  06/03/16 251 lb 6.4 oz (114 kg)   General Appearance: Well nourished well developed, non-toxic appearing, in no apparent distress. Eyes: PERRLA, EOMs, conjunctiva no swelling or erythema ENT/Mouth: Ear canals clear with no erythema, swelling, or discharge.  TMs normal bilaterally, oropharynx clear, moist, with no exudate.   Neck: Supple, thyroid normal, no JVD, no cervical adenopathy.  Respiratory: Respiratory effort normal, breath sounds clear A&P, no wheeze, rhonchi or rales noted.  No retractions, no accessory muscle usage Cardio: RRR with no MRGs. No  noted edema. 2+ peripheral edema Abdomen: Soft, + BS.  Non tender, no guarding, rebound, hernias, masses. Musculoskeletal: Full ROM, 5/5 strength, Normal gait Skin: Warm, dry without rashes, lesions, ecchymosis.  Neuro: Awake and oriented X 3, Cranial nerves intact. No cerebellar symptoms.  Psych: normal affect, Insight and Judgment appropriate.    Starlyn Skeans, PA-C 9:48 AM [facility]

## 2016-09-06 ENCOUNTER — Other Ambulatory Visit: Payer: Self-pay | Admitting: Internal Medicine

## 2016-09-06 LAB — BRAIN NATRIURETIC PEPTIDE: Brain Natriuretic Peptide: 13.5 pg/mL (ref <100)

## 2016-09-23 ENCOUNTER — Encounter: Payer: Self-pay | Admitting: Nurse Practitioner

## 2016-09-23 ENCOUNTER — Other Ambulatory Visit: Payer: Self-pay | Admitting: Physician Assistant

## 2016-09-23 DIAGNOSIS — R1013 Epigastric pain: Secondary | ICD-10-CM

## 2016-09-23 NOTE — Progress Notes (Signed)
Patient still with epigastric discomfort with food, will refer to GI for evaluation

## 2016-09-24 ENCOUNTER — Other Ambulatory Visit: Payer: Self-pay | Admitting: Internal Medicine

## 2016-09-24 ENCOUNTER — Other Ambulatory Visit: Payer: Self-pay | Admitting: Physician Assistant

## 2016-10-02 ENCOUNTER — Encounter: Payer: Self-pay | Admitting: Nurse Practitioner

## 2016-10-02 ENCOUNTER — Ambulatory Visit (INDEPENDENT_AMBULATORY_CARE_PROVIDER_SITE_OTHER): Payer: Medicare Other | Admitting: Nurse Practitioner

## 2016-10-02 VITALS — BP 114/70 | HR 76 | Ht 69.0 in | Wt 245.6 lb

## 2016-10-02 DIAGNOSIS — R1084 Generalized abdominal pain: Secondary | ICD-10-CM

## 2016-10-02 DIAGNOSIS — Z86010 Personal history of colonic polyps: Secondary | ICD-10-CM

## 2016-10-02 MED ORDER — DICYCLOMINE HCL 10 MG PO CAPS: ORAL_CAPSULE | ORAL | 0 refills | Status: DC

## 2016-10-02 NOTE — Patient Instructions (Addendum)
Stop the Zantac.  Resume Pantoprazole sodium ( Protonix) 40 mg.  We sent a prescription for Bentyl ( Dicyclomine) 10 mg to [facility].   Call [facility] in the interim if symptoms worsen.   W made you an appointment with Edwin Osborne for 10-24-2016 at 9:30 am.

## 2016-10-03 ENCOUNTER — Other Ambulatory Visit: Payer: Self-pay

## 2016-10-03 MED ORDER — HYOSCYAMINE SULFATE SL 0.125 MG SL SUBL: 1.0000 | SUBLINGUAL_TABLET | Freq: Three times a day (TID) | SUBLINGUAL | 5 refills | Status: DC

## 2016-10-03 NOTE — Progress Notes (Signed)
HPI:  Patient is a 82 year old male known to Dr. Carlean Purl. He has a history of adenomatous colon polyps. Patient was due for surveillance colonoscopy 2015. He is not interested in continuing surveillance program. Patient referred by PCP, Starlyn Skeans PA-C,  for evaluation of abdominal pain.   On 08/22/16 patient was seen by PCP for evaluation of LUQ pain, there was mention of constipation as well. He was started on Protonix, advised to discontinue wine and NSAIDs. CBC and LFTs normal. He has CKD and renal function was about baseline. At some point patient took a Z-Pak and according to follow-up PCP visit mid December his abdominal pain and swelling had improved. Because the pain had not resolved, PCP prescribed a round of Augmentin for possible diverticulitis. His pain again improved, but didn't resolve with antibiotics. He has seen has some episodes of significant left sided abdominal discomfort.  He stopped PPI, did not feel that it helped. He started Zantac for gas.    Patient describes the pain as a non-radiating, stabbing sensation in LUQ and left mid abdomen. Pain is unrelated to food. In addition to this localized pain he has had some lower abdominal cramping. His BMs have been irregular throughout since the beginning of December when the pain started. Yesterday patient had multiple bowel movements, subsequently felt much better and today he feels great. His weight is stable, appetite is fine. No blood in stools. No fevers.  No urinary symptoms.    Past Medical History:  Diagnosis Date  . Arthritis   . BPH (benign prostatic hyperplasia)   . Cataract   . CKD (chronic kidney disease), stage III   . Depression    "usually a happy go lucky"  . Diabetes mellitus   . Diverticulosis   . GERD (gastroesophageal reflux disease)    sometimes will take omeprazole  . Hyperlipidemia   . Hypertension     Past Surgical History:  Procedure Laterality Date  . CHOLECYSTECTOMY N/A 11/29/2015    Procedure: LAPAROSCOPIC CHOLECYSTECTOMY WITH INTRAOPERATIVE CHOLANGIOGRAM;  Surgeon: Erroll Luna, MD;  Location: [facility] OR;  Service: General;  Laterality: N/A;  . COLONOSCOPY  2005, 08/21/11   2005 tubulovillous adenoma and adenomas 2012 - 3 adenomas, largest 10 mm, diverticulosis  . FRACTURE SURGERY     ? right foot as a child  . KNEE ARTHROSCOPY    . TONSILLECTOMY AND ADENOIDECTOMY    . VEIN LIGATION AND STRIPPING     left   Family History  Problem Relation Age of Onset  . Prostate cancer Brother    Social History  Substance Use Topics  . Smoking status: Former Smoker    Types: Cigars    Quit date: 09/23/1987  . Smokeless tobacco: Never Used  . Alcohol use Yes     Comment: rarely   Current Outpatient Prescriptions  Medication Sig Dispense Refill  . aspirin 81 MG tablet Take 1 tablet (81 mg total) by mouth daily. Stop and restart on 09/04/11 (Patient not taking: Reported on 10/02/2016)    . Cinnamon 500 MG TABS Take 1,000 mg by mouth.    . doxazosin (CARDURA) 8 MG tablet TAKE 1 TABLET BY MOUTH FOR BLOOD PRESSURE / PROSTATE 90 tablet 1  . glipiZIDE (GLUCOTROL) 5 MG tablet TAKE 1 TABLET BY MOUTH TWICE DAILY 180 tablet 0  . hydrochlorothiazide (HYDRODIURIL) 25 MG tablet TAKE 1 TABLET EVERY DAY FRO BLOOD PRESSURE AND FLUID 90 tablet 1  . Hyoscyamine Sulfate SL (LEVSIN/SL) 0.125 MG SUBL Place  1 tablet under the tongue 3 (three) times daily. 120 each 5  . lisinopril (PRINIVIL,ZESTRIL) 40 MG tablet TAKE 1 TABLET BY MOUTH DAILY FOR BLOOD PRESSURE AND DIABETIC KIDNEY PROTECTION 90 tablet 1  . metFORMIN (GLUCOPHAGE-XR) 500 MG 24 hr tablet TAKE 2 TABLETS BY MOUTH TWICE DAILY 360 tablet 1  . Multiple Vitamins-Minerals (MULTIVITAMIN WITH MINERALS) tablet Take 1 tablet by mouth daily.      . Nutritional Supplements (VITAMIN D MAINTENANCE PO) Take 10,000 mg by mouth daily.      . pantoprazole (PROTONIX) 40 MG tablet TAKE 1 TABLET(40 MG) BY MOUTH DAILY 90 tablet 1  . pravastatin (PRAVACHOL) 40  MG tablet TAKE 1 TABLET BY MOUTH AT BEDTIME FOR CHOLESTEROL 90 tablet 1  . ranitidine (ZANTAC) 300 MG tablet Take 1 tablet (300 mg total) by mouth at bedtime. 180 tablet 1  . Specialty Vitamins Products (MAGNESIUM, AMINO ACID CHELATE,) 133 MG tablet Take 1 tablet by mouth daily.       No current facility-administered medications for this visit.    No Known Allergies   Review of Systems: Positive for fatigue, swelling of feet and legs and urine leakage. All other systems reviewed and negative except where noted in HPI.    Physical Exam: BP 114/70   Pulse 76   Ht 5\' 9"  (1.753 m)   Wt 245 lb 9.6 oz (111.4 kg)   BMI 36.27 kg/m  Constitutional:  Morbidly obese white male in no acute distress. Psychiatric: Pleasant. Normal mood and affect. Behavior is normal. HEENT: Normocephalic and atraumatic. Conjunctivae are normal. No scleral icterus. Neck supple.  Cardiovascular: Normal rate, regular rhythm.  Pulmonary/chest: Effort normal and breath sounds normal. No wheezing, rales or rhonchi. Abdominal: Soft, obese, nontender. Bowel sounds active throughout. There are no masses felt but limited exam given body habitus. . Extremities: no edema Lymphadenopathy: No cervical adenopathy noted. Neurological: Alert and oriented to person place and time. Skin: Skin is warm and dry. No rashes noted.   ASSESSMENT AND PLAN:  1. 82 yo male with several week history of LUQ / left mid abdominal pain and irregular bowels. Sounds like pain initially more of a burning in LUQ then more diffuse involving left mid abdomen as well. No response to PPI. Transient improvement after antibiotics. Abdominal exam is fairly unremarkable and he feels great today after several BMs yesterday.  No fevers, WBC normal. Atypical presentation but diverticulitis remains on list of DDx.   -Trial of bowel antispasmodic, only BID as needed (hx of BPH ).  -Since he does have burning in LUQ, recommend resumption of PPI since he only  tried it for a week. He can stop the Zantac.  --Not going to repeat course of antibiotics at this point, he had Zpak and Amoxicillin. If recurrent abdominal pain then imaging study is warrant. Otherwise, follow up with me in 2-3 weeks.   2. History of adenomatous colon polyps (2012). Patient was due for surveillance colonoscopy 2015, he does not want to continue surveillance program which is probably reasonable given age and co-morbidities  2. CKD 3 / HTN / morbid obesity / DM  Tye Savoy, NP  10/03/2016, 3:51 PM  Cc:  Starlyn Skeans PA-C

## 2016-10-05 NOTE — Progress Notes (Signed)
Agree with Ms. Guenther's assessment and plan. Kellyn Mccary E. Nolyn Eilert, MD, FACG   

## 2016-10-11 ENCOUNTER — Other Ambulatory Visit: Payer: Self-pay | Admitting: Internal Medicine

## 2016-10-14 ENCOUNTER — Telehealth: Payer: Self-pay | Admitting: Nurse Practitioner

## 2016-10-14 NOTE — Telephone Encounter (Signed)
Patient states he is not any better, is taking the Levsin. Last BMET was 09/05/16. Patient wants to have CT done prior to seeing you on 10/24/16. Do you want CT abd/pelvis with contrast?

## 2016-10-14 NOTE — Telephone Encounter (Signed)
Yes, we talked about giving pain some time and he was going to try a bowel antispamotic. If no improvement we talked about a CTscan. I think he was to come back to see me soon but if will be a while and he needs CTscan in the interim then we can do that. I will need to check on his renal function with a  BMET if he hasn't had one in the last few weeks.Just let me know if that is what he wants. Did he try the levsin? Also, how are his bowels moving, I believe he was having trouble before and thought pain better after BM Thanks

## 2016-10-14 NOTE — Telephone Encounter (Signed)
I didn't see a specific test to order in your last note, please advise on next steps.

## 2016-10-15 ENCOUNTER — Other Ambulatory Visit: Payer: Self-pay

## 2016-10-15 DIAGNOSIS — R109 Unspecified abdominal pain: Secondary | ICD-10-CM

## 2016-10-15 NOTE — Telephone Encounter (Signed)
Patient scheduled for 1/30 at 1:30 [facility] for CT ab/pelvis with oral contrast only.

## 2016-10-15 NOTE — Telephone Encounter (Signed)
Ok, yes we can get a CTscan on him (abd/pelvis) but it will not be optimal study without IV contrast which we cannot use given CKD Thanks for arranging this.

## 2016-10-15 NOTE — Progress Notes (Signed)
Prep

## 2016-10-20 ENCOUNTER — Other Ambulatory Visit: Payer: Self-pay

## 2016-10-21 ENCOUNTER — Encounter (HOSPITAL_COMMUNITY): Payer: Self-pay

## 2016-10-21 ENCOUNTER — Ambulatory Visit (HOSPITAL_COMMUNITY)
Admission: RE | Admit: 2016-10-21 | Discharge: 2016-10-21 | Disposition: A | Discharge status: Home / Self Care | Payer: Medicare Other | Source: Ambulatory Visit | Attending: Nurse Practitioner | Admitting: Nurse Practitioner

## 2016-10-21 DIAGNOSIS — M5136 Other intervertebral disc degeneration, lumbar region: Secondary | ICD-10-CM | POA: Insufficient documentation

## 2016-10-21 DIAGNOSIS — R109 Unspecified abdominal pain: Secondary | ICD-10-CM

## 2016-10-21 DIAGNOSIS — I700 Atherosclerosis of aorta: Secondary | ICD-10-CM | POA: Insufficient documentation

## 2016-10-21 DIAGNOSIS — K5730 Diverticulosis of large intestine without perforation or abscess without bleeding: Secondary | ICD-10-CM | POA: Diagnosis not present

## 2016-10-21 DIAGNOSIS — R1013 Epigastric pain: Secondary | ICD-10-CM | POA: Diagnosis not present

## 2016-10-24 ENCOUNTER — Ambulatory Visit (INDEPENDENT_AMBULATORY_CARE_PROVIDER_SITE_OTHER): Payer: Medicare Other | Admitting: Physician Assistant

## 2016-10-24 ENCOUNTER — Encounter (INDEPENDENT_AMBULATORY_CARE_PROVIDER_SITE_OTHER): Payer: Self-pay

## 2016-10-24 ENCOUNTER — Encounter: Payer: Self-pay | Admitting: Physician Assistant

## 2016-10-24 VITALS — BP 112/60 | HR 96 | Ht 69.0 in | Wt 244.4 lb

## 2016-10-24 DIAGNOSIS — R194 Change in bowel habit: Secondary | ICD-10-CM | POA: Diagnosis not present

## 2016-10-24 DIAGNOSIS — R1012 Left upper quadrant pain: Secondary | ICD-10-CM | POA: Diagnosis not present

## 2016-10-24 MED ORDER — POLYETHYLENE GLYCOL 3350 17 GM/SCOOP PO POWD: ORAL | 5 refills | Status: DC

## 2016-10-24 NOTE — Progress Notes (Addendum)
Chief Complaint: LUQ abdominal pain, Change in bowel habits  HPI:  Edwin Osborne is a 82 year old Caucasian male known to Dr. Carlean Purl, who returns to clinic today for follow-up of his left upper quadrant abdominal pain as well as a change in bowel habits.     Please recall patient was recently seen in clinic on 10/02/16 by Tye Savoy, NP and at that time it was noted that the patient initially saw his PCP on 08/22/16 for this pain and there was also mention of constipation. He was started on Protonix and advised to discontinue NSAIDs. His labs including CBC and LFTs were normal. He was on multiple rounds of antibiotic including a Z-Pak and Augmentin for possible diverticulitis but this did not resolve his pain. Patient described pain as nonradiating, stabbing in sensation in his left upper quadrant. This was unrelated to food, but was relieved after bowel movements. At that time patient was told to stop his Zantac and resume Protonix 40 mg and was given a prescription for Dicyclomine 10 mg.   Since his last visit, the patient had a CT abdomen pelvis on 10/21/16 without contrast. This showed no definite explanation for the patient's abdominal pain, there were scattered rectosigmoid and distal descending colon diverticula without diverticulitis, terminal ileum and appendix were unremarkable, moderate abdominal aortic atherosclerosis and degenerative disc disease at L3-4 and L4-5.   Today, the patient returns to clinic and tells me that he has been taking his Pantoprazole 40 mg daily and believes that this has helped with some of his "burning sensation", it certainly hasn't made anything worse. He does continue with a left upper quadrant abdominal pain which he tells me is intermittently there throughout the day. He rates this as that is a 2/10 and more of an "aggravation" than anything else. He continues to tell me that this is relieved if he has a very good bowel movement. Along with this the patient has  also had a lot of "gas recently". He tells me that one night he had to get up out of his sleep because of his gas and pains related to this. Patient does note that it has been "harder to pass a stool" than normal lately. He tells me that he has to "rock back and forth" on the toilet in order to produce a "very hard" stool. Patient admits that he has had a decrease in activity level since he has gotten older and his "knees have gotten bad". Patient does drink at least 8 8 ounce glasses of water a day, but denies a high-fiber diet or using laxatives in the past. Patient does tell me that this pain can also feel like a pressure that can sometimes push up in his left upper quadrant and make it feel like it is harder for him to take a breath, it also feels like a "bulge" that he can press on at times to make and it will feel better.   Patient denies fever, chills, blood in his stool, melena, weight loss, fatigue, anorexia, nausea, vomiting, heartburn, reflux.  Past Medical History:  Diagnosis Date  . Arthritis   . BPH (benign prostatic hyperplasia)   . Cataract   . CKD (chronic kidney disease), stage III   . Depression    "usually a happy go lucky"  . Diabetes mellitus   . Diverticulosis   . GERD (gastroesophageal reflux disease)    sometimes will take omeprazole  . Hyperlipidemia   . Hypertension     Past Surgical  History:  Procedure Laterality Date  . CHOLECYSTECTOMY N/A 11/29/2015   Procedure: LAPAROSCOPIC CHOLECYSTECTOMY WITH INTRAOPERATIVE CHOLANGIOGRAM;  Surgeon: Erroll Luna, MD;  Location: [facility] OR;  Service: General;  Laterality: N/A;  . COLONOSCOPY  2005, 08/21/11   2005 tubulovillous adenoma and adenomas 2012 - 3 adenomas, largest 10 mm, diverticulosis  . FRACTURE SURGERY     ? right foot as a child  . KNEE ARTHROSCOPY    . TONSILLECTOMY AND ADENOIDECTOMY    . VEIN LIGATION AND STRIPPING     left    Current Outpatient Prescriptions  Medication Sig Dispense Refill  . aspirin 81  MG tablet Take 1 tablet (81 mg total) by mouth daily. Stop and restart on 09/04/11    . Cinnamon 500 MG TABS Take 1,000 mg by mouth.    . doxazosin (CARDURA) 8 MG tablet TAKE 1 TABLET BY MOUTH EVERY DAY FOR BLOOD PRESSURE/PROSTATE 90 tablet 0  . glipiZIDE (GLUCOTROL) 5 MG tablet TAKE 1 TABLET BY MOUTH TWICE DAILY 180 tablet 0  . hydrochlorothiazide (HYDRODIURIL) 25 MG tablet TAKE 1 TABLET EVERY DAY FRO BLOOD PRESSURE AND FLUID 90 tablet 1  . Hyoscyamine Sulfate SL (LEVSIN/SL) 0.125 MG SUBL Place 1 tablet under the tongue 3 (three) times daily. 120 each 5  . lisinopril (PRINIVIL,ZESTRIL) 40 MG tablet TAKE 1 TABLET BY MOUTH DAILY FOR BLOOD PRESSURE AND DIABETIC KIDNEY PROTECTION 90 tablet 1  . metFORMIN (GLUCOPHAGE-XR) 500 MG 24 hr tablet TAKE 2 TABLETS BY MOUTH TWICE DAILY 360 tablet 1  . Multiple Vitamins-Minerals (MULTIVITAMIN WITH MINERALS) tablet Take 1 tablet by mouth daily.      . Nutritional Supplements (VITAMIN D MAINTENANCE PO) Take 10,000 mg by mouth daily.      . pantoprazole (PROTONIX) 40 MG tablet TAKE 1 TABLET(40 MG) BY MOUTH DAILY 90 tablet 1  . pravastatin (PRAVACHOL) 40 MG tablet TAKE 1 TABLET BY MOUTH AT BEDTIME FOR CHOLESTEROL 90 tablet 1  . Specialty Vitamins Products (MAGNESIUM, AMINO ACID CHELATE,) 133 MG tablet Take 1 tablet by mouth daily.      Marland Kitchen dicyclomine (BENTYL) 10 MG capsule TK 1 C PO BID PRF ABD PAIN OR CRAMPING OR SPASMS   PT NOT TAKING  0  . polyethylene glycol powder (GLYCOLAX/MIRALAX) powder Take once daily x 2 days, if not helping then increase to twice daily. Can take up to 4 x daily if needed 255 g 5   No current facility-administered medications for this visit.     Allergies as of 10/24/2016  . (No Known Allergies)    Family History  Problem Relation Age of Onset  . Prostate cancer Brother     Social History   Social History  . Marital status: Married    Spouse name: N/A  . Number of children: 3  . Years of education: N/A   Occupational  History  . retired    Social History Main Topics  . Smoking status: Former Smoker    Types: Cigars    Quit date: 09/23/1987  . Smokeless tobacco: Never Used  . Alcohol use Yes     Comment: rarely  . Drug use: No  . Sexual activity: Not on file   Other Topics Concern  . Not on file   Social History Narrative  . No narrative on file    Review of Systems:    Constitutional: No weight loss, fever, chills, weakness or fatigu Cardiovascular: No chest pain Respiratory: No SOB Gastrointestinal: See HPI and otherwise negative   Physical  Exam:  Vital signs: BP 112/60   Pulse 96   Ht 5\' 9"  (1.753 m)   Wt 244 lb 6 oz (110.8 kg)   BMI 36.09 kg/m   Constitutional:   Pleasant overweight Caucasian male appears to be in NAD, Well developed, Well nourished, alert and cooperative Respiratory: Respirations even and unlabored. Lungs clear to auscultation bilaterally.   No wheezes, crackles, or rhonchi.  Cardiovascular: Normal S1, S2. No MRG. Regular rate and rhythm. No peripheral edema, cyanosis or pallor.  Gastrointestinal:  Soft, nondistended, moderate LUQ tenderness to deep palpation No rebound or guarding. Normal bowel sounds. No appreciable masses or hepatomegaly. Psychiatric:  Demonstrates good judgement and reason without abnormal affect or behaviors.  MOST RECENT LABS AND IMAGING: CBC    Component Value Date/Time   WBC 6.7 09/05/2016 1012   RBC 4.55 09/05/2016 1012   HGB 13.3 09/05/2016 1012   HCT 40.1 09/05/2016 1012   PLT 174 09/05/2016 1012   MCV 88.1 09/05/2016 1012   MCH 29.2 09/05/2016 1012   MCHC 33.2 09/05/2016 1012   RDW 13.8 09/05/2016 1012   LYMPHSABS 1,340 09/05/2016 1012   MONOABS 737 09/05/2016 1012   EOSABS 268 09/05/2016 1012   BASOSABS 0 09/05/2016 1012    CMP     Component Value Date/Time   NA 141 09/05/2016 1012   K 4.6 09/05/2016 1012   CL 104 09/05/2016 1012   CO2 27 09/05/2016 1012   GLUCOSE 183 (H) 09/05/2016 1012   BUN 20 09/05/2016 1012    CREATININE 1.73 (H) 09/05/2016 1012   CALCIUM 9.2 09/05/2016 1012   PROT 6.3 09/05/2016 1012   ALBUMIN 4.1 09/05/2016 1012   AST 18 09/05/2016 1012   ALT 15 09/05/2016 1012   ALKPHOS 52 09/05/2016 1012   BILITOT 0.4 09/05/2016 1012   GFRNONAA 37 (L) 09/05/2016 1012   GFRAA 42 (L) 09/05/2016 1012    CT ABDOMEN AND PELVIS WITHOUT CONTRAST 10/21/16  TECHNIQUE: Multidetector CT imaging of the abdomen and pelvis was performed following the standard protocol without IV contrast.  COMPARISON:  CT abdomen and pelvis of 10/19/2015  FINDINGS: Lower chest: The lung bases are clear.  Hepatobiliary: The liver is unremarkable in the unenhanced state. Surgical clips are present from prior cholecystectomy.  Pancreas: The pancreas is normal in size and the peripancreatic fat planes are well preserved. The pancreatic duct is not dilated.  Spleen: The spleen is unremarkable.  Adrenals/Urinary Tract: The adrenal glands appear normal. The kidneys are unremarkable in the unenhanced state. A probable cyst is noted in the lower pole of the left kidney not well evaluated on this unenhanced study. This was present on the prior CT. No hydronephrosis is seen. No renal calculi are noted. The ureters are normal in caliber and no ureteral calculi are seen. The urinary bladder is not well distended but no abnormality is noted.  Stomach/Bowel: The stomach is largely decompressed. No small bowel distention is seen. Scattered rectosigmoid and distal descending colon diverticula are present. No diverticulitis is seen. Colon is largely decompressed and somewhat tortuous. The terminal ileum and appendix are unremarkable.  Vascular/Lymphatic: The abdominal aorta is normal in caliber. There is moderate abdominal aortic atherosclerosis present. No adenopathy is seen.  Reproductive: The prostate is normal in size, measuring 4.9 x 4.2 cm.  Other: None.  Musculoskeletal: The lumbar vertebrae are  in normal alignment. Degenerative disc disease is most marked at L3-4 and L4-5 levels where there is considerable degenerative change and vacuum disc phenomenon. No  compression deformity is seen. There are degenerative changes in the SI joints.  IMPRESSION: 1. No definite explanation for the patient's abdominal pain is seen. 2. Scattered rectosigmoid and distal descending colon diverticula. No diverticulitis. 3. The terminal ileum and appendix are unremarkable. 4. Moderate abdominal aortic atherosclerosis. 5. Degenerative disc disease at L3-4 and L4-5.   Electronically Signed   By: Ivar Drape M.D.   On: 10/21/2016 14:50  Assessment: 1. Left upper quadrant abdominal pain: patient has had normal imaging and labs, patient continues to describe this pain is relieved after a very good bowel movement and that typically passing a bowel movement is hard for him, believe this pain could be related to constipation and gas buildup/ splenic flexure syndrome 2. Change in bowel habits: Towards constipation as above  Plan: 1. We discussed that I believe patient's pain may be related to constipation since it is relieved after a good bowel movement as well as partially splenic flexure syndrome and build up of gas in his left upper quadrant. Patient believes that this could be true. 2. Prescription for MiraLAX once daily for the next 2 days, if this is not helping him to produce soft formed bowel movements then he can increase this to twice daily and so on up to 4 times a day. We did discuss titration in detail. Once patient is having more normal bowel movements if he has decreased pain/alleviation of pain then we can cancel colonoscopy which is scheduled at the end of March. If not we will proceed. 3. Scheduled patient for a colonoscopy at the end of March in case he continues with left upper quadrant abdominal pain. We did discuss risks, benefits, limitations and alternatives and the patient agrees to  proceed if necessary. This was scheduled with Dr. Carlean Purl. Again patient will call our office if his symptoms are resolved with normal bowel movements. 4. Patient to continue Pantoprazole 40 mg daily as he believes this is helping his symptoms. 5. Patient to follow in clinic after time of colonoscopy, or if colonoscopy is canceled he will need to have a follow-up in office with Dr. Carlean Purl as he has not seen him during this episode of pain.  Ellouise Newer, PA-C [facility] 10/24/2016, 11:17 AM  Cc: Unk Pinto, MD   Agree with Ms. Mykel Mohl's evaluation and management. Gatha Mayer, MD, [facility]

## 2016-10-24 NOTE — Patient Instructions (Signed)
We have sent the following medications to your pharmacy for you to pick up at your convenience: Polyethylene Glycol to start once daily for 2 days. Then if not helping/easing your bowel movements then increase to twice daily ( can take up to four times daily if needed).  Increase your water to 6-8 oz glasses per day.  Continue your pantoprazole 40 mg daily.   You have been scheduled for a colonoscopy. Please follow written instructions given to you at your visit today.  Please pick up your prep supplies at the pharmacy within the next 1-3 days. If you use inhalers (even only as needed), please bring them with you on the day of your procedure. Your physician has requested that you go to www.[email or web address].com and enter the access code given to you at your visit today. This web site gives a general overview about your procedure. However, you should still follow specific instructions given to you by our office regarding your preparation for the procedure.   Please call if you are doing better and we can schedule you a follow up visit appointment and cancel your Colonoscopy.

## 2016-11-10 ENCOUNTER — Other Ambulatory Visit: Payer: Self-pay | Admitting: Internal Medicine

## 2016-11-22 ENCOUNTER — Other Ambulatory Visit: Payer: Self-pay | Admitting: Internal Medicine

## 2016-12-03 ENCOUNTER — Encounter: Payer: Self-pay | Admitting: Internal Medicine

## 2016-12-12 ENCOUNTER — Encounter: Payer: Self-pay | Admitting: Internal Medicine

## 2016-12-12 ENCOUNTER — Ambulatory Visit (INDEPENDENT_AMBULATORY_CARE_PROVIDER_SITE_OTHER): Payer: Medicare Other | Admitting: Internal Medicine

## 2016-12-12 VITALS — BP 116/64 | HR 92 | Temp 98.0°F | Resp 16 | Ht 69.0 in | Wt 252.0 lb

## 2016-12-12 DIAGNOSIS — Z79899 Other long term (current) drug therapy: Secondary | ICD-10-CM

## 2016-12-12 DIAGNOSIS — I10 Essential (primary) hypertension: Secondary | ICD-10-CM

## 2016-12-12 DIAGNOSIS — E782 Mixed hyperlipidemia: Secondary | ICD-10-CM | POA: Diagnosis not present

## 2016-12-12 DIAGNOSIS — E559 Vitamin D deficiency, unspecified: Secondary | ICD-10-CM

## 2016-12-12 DIAGNOSIS — R197 Diarrhea, unspecified: Secondary | ICD-10-CM

## 2016-12-12 DIAGNOSIS — E1121 Type 2 diabetes mellitus with diabetic nephropathy: Secondary | ICD-10-CM | POA: Diagnosis not present

## 2016-12-12 DIAGNOSIS — E1142 Type 2 diabetes mellitus with diabetic polyneuropathy: Secondary | ICD-10-CM | POA: Diagnosis not present

## 2016-12-12 LAB — BASIC METABOLIC PANEL WITH GFR
BUN: 25 mg/dL (ref 7–25)
CO2: 26 mmol/L (ref 20–31)
Calcium: 9 mg/dL (ref 8.6–10.3)
Chloride: 103 mmol/L (ref 98–110)
Creat: 1.68 mg/dL — ABNORMAL HIGH (ref 0.70–1.18)
GFR, Est African American: 44 mL/min — ABNORMAL LOW (ref >=60)
GFR, Est Non African American: 38 mL/min — ABNORMAL LOW (ref >=60)
Glucose, Bld: 251 mg/dL — ABNORMAL HIGH (ref 65–99)
Potassium: 4.7 mmol/L (ref 3.5–5.3)
Sodium: 138 mmol/L (ref 135–146)

## 2016-12-12 LAB — HEPATIC FUNCTION PANEL
ALT: 20 U/L (ref 9–46)
AST: 20 U/L (ref 10–35)
Albumin: 4 g/dL (ref 3.6–5.1)
Alkaline Phosphatase: 66 U/L (ref 40–115)
Bilirubin, Direct: 0.1 mg/dL (ref <=0.2)
Indirect Bilirubin: 0.4 mg/dL (ref 0.2–1.2)
Total Bilirubin: 0.5 mg/dL (ref 0.2–1.2)
Total Protein: 6.3 g/dL (ref 6.1–8.1)

## 2016-12-12 LAB — LIPID PANEL
Cholesterol: 116 mg/dL (ref <200)
HDL: 34 mg/dL — ABNORMAL LOW (ref >40)
LDL Cholesterol: 42 mg/dL (ref <100)
Total CHOL/HDL Ratio: 3.4 Ratio (ref <5.0)
Triglycerides: 198 mg/dL — ABNORMAL HIGH (ref <150)
VLDL: 40 mg/dL — ABNORMAL HIGH (ref <30)

## 2016-12-12 LAB — TSH: TSH: 2.79 mIU/L (ref 0.40–4.50)

## 2016-12-12 LAB — CBC WITH DIFFERENTIAL/PLATELET
Basophils Absolute: 0 cells/uL (ref 0–200)
Basophils Relative: 0 %
Eosinophils Absolute: 280 cells/uL (ref 15–500)
Eosinophils Relative: 4 %
HCT: 40.8 % (ref 38.5–50.0)
Hemoglobin: 13.5 g/dL (ref 13.2–17.1)
Lymphocytes Relative: 20 %
Lymphs Abs: 1400 cells/uL (ref 850–3900)
MCH: 29.3 pg (ref 27.0–33.0)
MCHC: 33.1 g/dL (ref 32.0–36.0)
MCV: 88.7 fL (ref 80.0–100.0)
MPV: 10.7 fL (ref 7.5–12.5)
Monocytes Absolute: 630 cells/uL (ref 200–950)
Monocytes Relative: 9 %
Neutro Abs: 4690 cells/uL (ref 1500–7800)
Neutrophils Relative %: 67 %
Platelets: 184 10*3/uL (ref 140–400)
RBC: 4.6 MIL/uL (ref 4.20–5.80)
RDW: 14.1 % (ref 11.0–15.0)
WBC: 7 10*3/uL (ref 3.8–10.8)

## 2016-12-12 NOTE — Progress Notes (Signed)
Assessment and Plan:  Hypertension:  -cont medications -well controlled -refilled given today -monitor blood pressure at home. -Continue DASH diet -Reminder to go to the ER if any CP, SOB, nausea, dizziness, severe HA, changes vision/speech, left arm numbness and tingling and jaw pain.  Cholesterol -cont pravastatin -LDL goal < 70 -most recently at goal - Continue diet and exercise -Check cholesterol.   Diabetes with diabetic polyneuropathy  -not at goal A1c < 7 -cont metformin and glipizide -discussed importance of healthy diet -exercise as tolerated -A1c level  Vitamin D Def -Continue medications.   Diarrhea -likely related to poor compliance with diet after removal of gallbladder -can consider using cholestyramine for stool binding -he is due for colonoscopy on Wednesday next week  Morbid obesity -discussed healthy diet -not great candidate for diet medications  Continue diet and meds as discussed. Further disposition pending results of labs. Discussed med's effects and SE's.    HPI 82 y.o. male  presents for 3 month follow up with hypertension, hyperlipidemia, diabetes and vitamin D deficiency.   His blood pressure has been controlled at home, today their BP is BP: 116/64.He does workout. He denies chest pain, shortness of breath, dizziness.   He is on cholesterol medication and denies myalgias. His cholesterol is at goal. The cholesterol was:  09/05/2016: Cholesterol 140; HDL 34; LDL Cholesterol 45; Triglycerides 307   He has not  been working on diet and exercise for diabetes with diabetic chronic kidney disease and with diabetic polyneuropathy, he is on bASA, he is on ACE/ARB, and denies  foot ulcerations, hyperglycemia, hypoglycemia , increased appetite, nausea, paresthesia of the feet, polydipsia, polyuria, visual disturbances, vomiting and weight loss. Last A1C was: 09/05/2016: Hgb A1c MFr Bld 7.9   Patient is on Vitamin D supplement. 01/18/2016: Vit D,  25-Hydroxy 44  He reports that he is still having some diarrhea.  He reports that he is due to have a colonoscopy on Wednesday.  He reports that he is still having pain in the stomach.  He reports that he has been doing slightly better and some days are worse.  He reports he finds it difficult to eat healthy.     Current Medications:  Current Outpatient Prescriptions on File Prior to Visit  Medication Sig Dispense Refill  . aspirin 81 MG tablet Take 1 tablet (81 mg total) by mouth daily. Stop and restart on 09/04/11    . Cinnamon 500 MG TABS Take 1,000 mg by mouth.    . doxazosin (CARDURA) 8 MG tablet TAKE 1 TABLET BY MOUTH EVERY DAY FOR BLOOD PRESSURE/PROSTATE 90 tablet 0  . glipiZIDE (GLUCOTROL) 5 MG tablet TAKE 1 TABLET BY MOUTH TWICE DAILY 180 tablet 0  . hydrochlorothiazide (HYDRODIURIL) 25 MG tablet TAKE 1 TABLET BY MOUTH EVERY DAY FOR BLOOD PRESSURE AND FLUID 90 tablet 0  . lisinopril (PRINIVIL,ZESTRIL) 40 MG tablet TAKE 1 TABLET BY MOUTH DAILY FOR BLOOD PRESSURE AND DIABETIC KIDNEY PROTECTION 90 tablet 1  . metFORMIN (GLUCOPHAGE-XR) 500 MG 24 hr tablet TAKE 2 TABLETS BY MOUTH TWICE DAILY 360 tablet 1  . Multiple Vitamins-Minerals (MULTIVITAMIN WITH MINERALS) tablet Take 1 tablet by mouth daily.      . Nutritional Supplements (VITAMIN D MAINTENANCE PO) Take 10,000 mg by mouth daily.      . pantoprazole (PROTONIX) 40 MG tablet TAKE 1 TABLET(40 MG) BY MOUTH DAILY 90 tablet 1  . pravastatin (PRAVACHOL) 40 MG tablet TAKE 1 TABLET BY MOUTH AT BEDTIME FOR CHOLESTEROL 90 tablet 1  .  Specialty Vitamins Products (MAGNESIUM, AMINO ACID CHELATE,) 133 MG tablet Take 1 tablet by mouth daily.       No current facility-administered medications on file prior to visit.    Medical History:  Past Medical History:  Diagnosis Date  . Arthritis   . BPH (benign prostatic hyperplasia)   . Cataract   . CKD (chronic kidney disease), stage III   . Depression    "usually a happy go lucky"  . Diabetes  mellitus   . Diverticulosis   . GERD (gastroesophageal reflux disease)    sometimes will take omeprazole  . Hyperlipidemia   . Hypertension    Allergies: No Known Allergies   Review of Systems:  Review of Systems  Constitutional: Negative for chills, fever and malaise/fatigue.  HENT: Negative for congestion, ear pain and sore throat.   Eyes: Negative.   Respiratory: Negative for cough, shortness of breath and wheezing.   Cardiovascular: Negative for chest pain, palpitations and leg swelling.  Gastrointestinal: Positive for abdominal pain and diarrhea. Negative for blood in stool, constipation, heartburn, melena, nausea and vomiting.  Genitourinary: Negative.   Skin: Negative.   Neurological: Negative for dizziness, sensory change, loss of consciousness and headaches.  Psychiatric/Behavioral: Negative for depression. The patient is not nervous/anxious and does not have insomnia.     Family history- Review and unchanged  Social history- Review and unchanged  Physical Exam: BP 116/64   Pulse 92   Temp 98 F (36.7 C) (Temporal)   Resp 16   Ht 5\' 9"  (1.753 m)   Wt 252 lb (114.3 kg)   BMI 37.21 kg/m  Wt Readings from Last 3 Encounters:  12/12/16 252 lb (114.3 kg)  10/24/16 244 lb 6 oz (110.8 kg)  10/02/16 245 lb 9.6 oz (111.4 kg)   General Appearance: Well nourished well developed, non-toxic appearing, in no apparent distress. Eyes: PERRLA, EOMs, conjunctiva no swelling or erythema ENT/Mouth: Ear canals clear with no erythema, swelling, or discharge.  TMs normal bilaterally, oropharynx clear, moist, with no exudate.   Neck: Supple, thyroid normal, no JVD, no cervical adenopathy.  Respiratory: Respiratory effort normal, breath sounds clear A&P, no wheeze, rhonchi or rales noted.  No retractions, no accessory muscle usage Cardio: RRR with no MRGs. No noted edema.  Abdomen: Soft, + BS.  Non tender, no guarding, rebound, hernias, masses. Musculoskeletal: Full ROM, 5/5 strength,  Normal gait Skin: Warm, dry without rashes, lesions, ecchymosis.  Neuro: Awake and oriented X 3, Cranial nerves intact. No cerebellar symptoms.  Psych: normal affect, Insight and Judgment appropriate.    Starlyn Skeans, PA-C 9:28 AM [facility]

## 2016-12-13 LAB — HEMOGLOBIN A1C
Hgb A1c MFr Bld: 8.8 % — ABNORMAL HIGH (ref <5.7)
Mean Plasma Glucose: 206 mg/dL

## 2016-12-17 ENCOUNTER — Encounter: Payer: Self-pay | Admitting: Internal Medicine

## 2016-12-17 ENCOUNTER — Ambulatory Visit (AMBULATORY_SURGERY_CENTER): Payer: Medicare Other | Admitting: Internal Medicine

## 2016-12-17 VITALS — BP 115/54 | HR 78 | Temp 98.6°F | Resp 11 | Ht 69.0 in | Wt 244.0 lb

## 2016-12-17 DIAGNOSIS — D123 Benign neoplasm of transverse colon: Secondary | ICD-10-CM

## 2016-12-17 DIAGNOSIS — I10 Essential (primary) hypertension: Secondary | ICD-10-CM | POA: Diagnosis not present

## 2016-12-17 DIAGNOSIS — R1013 Epigastric pain: Secondary | ICD-10-CM

## 2016-12-17 DIAGNOSIS — Z860100 Personal history of colon polyps, unspecified: Secondary | ICD-10-CM

## 2016-12-17 DIAGNOSIS — R194 Change in bowel habit: Secondary | ICD-10-CM | POA: Diagnosis present

## 2016-12-17 DIAGNOSIS — K5900 Constipation, unspecified: Secondary | ICD-10-CM | POA: Diagnosis not present

## 2016-12-17 DIAGNOSIS — E119 Type 2 diabetes mellitus without complications: Secondary | ICD-10-CM | POA: Diagnosis not present

## 2016-12-17 MED ORDER — SODIUM CHLORIDE 0.9 % IV SOLN: 500.0000 mL | INTRAVENOUS | Status: DC

## 2016-12-17 MED ORDER — PANTOPRAZOLE SODIUM 40 MG PO TBEC: 40.0000 mg | DELAYED_RELEASE_TABLET | Freq: Every day | ORAL | 3 refills | Status: DC

## 2016-12-17 NOTE — Op Note (Signed)
[facility] Patient Name: Edwin Osborne Procedure Date: 12/17/2016 2:29 PM MRN: [id number] Endoscopist: Gatha Mayer , MD Age: 82 Referring MD:  Date of Birth: 02-24-1935 Gender: Male Account #: [id number] Procedure:                Colonoscopy Indications:              Change in bowel habits Medicines:                Propofol per Anesthesia, Monitored Anesthesia Care Procedure:                Pre-Anesthesia Assessment:                           - Prior to the procedure, a History and Physical                            was performed, and patient medications and                            allergies were reviewed. The patient's tolerance of                            previous anesthesia was also reviewed. The risks                            and benefits of the procedure and the sedation                            options and risks were discussed with the patient.                            All questions were answered, and informed consent                            was obtained. Prior Anticoagulants: The patient                            last took aspirin 1 day prior to the procedure. ASA                            Grade Assessment: III - A patient with severe                            systemic disease. After reviewing the risks and                            benefits, the patient was deemed in satisfactory                            condition to undergo the procedure.                           After obtaining informed consent, the colonoscope  was passed under direct vision. Throughout the                            procedure, the patient's blood pressure, pulse, and                            oxygen saturations were monitored continuously. The                            Model PCF-[id number]) scope was introduced                            through the anus and advanced to the the cecum,                            identified by  appendiceal orifice and ileocecal                            valve. The colonoscopy was performed without                            difficulty. The patient tolerated the procedure                            well. The quality of the bowel preparation was                            good. The ileocecal valve, appendiceal orifice, and                            rectum were photographed. Scope In: 2:46:25 PM Scope Out: 3:02:03 PM Scope Withdrawal Time: 0 hours 11 minutes 48 seconds  Total Procedure Duration: 0 hours 15 minutes 38 seconds  Findings:                 The perianal and digital rectal examinations were                            normal.                           A diminutive polyp was found in the transverse                            colon. The polyp was sessile. The polyp was removed                            with a cold snare. Resection and retrieval were                            complete. Verification of patient identification                            for the specimen was done. Estimated blood loss was  minimal.                           Many small and large-mouthed diverticula were found                            in the sigmoid colon and descending colon. There                            was narrowing of the colon in association with the                            diverticular opening.                           The exam was otherwise without abnormality on                            direct and retroflexion views. Complications:            No immediate complications. Estimated Blood Loss:     Estimated blood loss was minimal. Impression:               - One diminutive polyp in the transverse colon,                            removed with a cold snare. Resected and retrieved.                           - Severe diverticulosis in the sigmoid colon and in                            the descending colon. There was narrowing of the                             colon in association with the diverticular opening.                           - The examination was otherwise normal on direct                            and retroflexion views. Recommendation:           - Patient has a contact number available for                            emergencies. The signs and symptoms of potential                            delayed complications were discussed with the                            patient. Return to normal activities tomorrow.                            Written discharge  instructions were provided to the                            patient.                           - Resume previous diet.                           - Continue present medications.                           - No repeat colonoscopy due to age.                           - I think he may need EGD fopr LUQ pain - will                            discuss.                           Could be neuropathic pain Gatha Mayer, MD 12/17/2016 3:17:37 PM This report has been signed electronically.

## 2016-12-17 NOTE — Patient Instructions (Addendum)
Nothing bad here - a tiny polyp was removed and I saw diverticulosis. I will discuss scheduling an upper endoscopy to look into the stomach since you still have pain in left upper abdomen.  I appreciate the opportunity to care for you. Gatha Mayer, MD, FACG    YOU HAD AN ENDOSCOPIC PROCEDURE TODAY AT [facility]:   Refer to the procedure report that was given to you for any specific questions about what was found during the examination.  If the procedure report does not answer your questions, please call your gastroenterologist to clarify.  If you requested that your care partner not be given the details of your procedure findings, then the procedure report has been included in a sealed envelope for you to review at your convenience later.  YOU SHOULD EXPECT: Some feelings of bloating in the abdomen. Passage of more gas than usual.  Walking can help get rid of the air that was put into your GI tract during the procedure and reduce the bloating. If you had a lower endoscopy (such as a colonoscopy or flexible sigmoidoscopy) you may notice spotting of blood in your stool or on the toilet paper. If you underwent a bowel prep for your procedure, you may not have a normal bowel movement for a few days.  Please Note:  You might notice some irritation and congestion in your nose or some drainage.  This is from the oxygen used during your procedure.  There is no need for concern and it should clear up in a day or so.  SYMPTOMS TO REPORT IMMEDIATELY:   Following lower endoscopy (colonoscopy or flexible sigmoidoscopy):  Excessive amounts of blood in the stool  Significant tenderness or worsening of abdominal pains  Swelling of the abdomen that is new, acute  Fever of 100F or higher   For urgent or emergent issues, a gastroenterologist can be reached at any hour by calling [phone].   DIET:  We do recommend a small meal at first, but then you may proceed to your  regular diet.  Drink plenty of fluids but you should avoid alcoholic beverages for 24 hours.  ACTIVITY:  You should plan to take it easy for the rest of today and you should NOT DRIVE or use heavy machinery until tomorrow (because of the sedation medicines used during the test).    FOLLOW UP: Our staff will call the number listed on your records the next business day following your procedure to check on you and address any questions or concerns that you may have regarding the information given to you following your procedure. If we do not reach you, we will leave a message.  However, if you are feeling well and you are not experiencing any problems, there is no need to return our call.  We will assume that you have returned to your regular daily activities without incident.  If any biopsies were taken you will be contacted by phone or by letter within the next 1-3 weeks.  Please call us at [phone] if you have not heard about the biopsies in 3 weeks.    SIGNATURES/CONFIDENTIALITY: You and/or your care partner have signed paperwork which will be entered into your electronic medical record.  These signatures attest to the fact that that the information above on your After Visit Summary has been reviewed and is understood.  Full responsibility of the confidentiality of this discharge information lies with you and/or your care-partner.   Handouts were  given to your care partner on polyps and diverticulosis. Your blood sugar was 141 in the recovery room. You may resume your current medications today. Await biopsy results Appointment for Upper Endoscopy and pre-visit.Marland Kitchen Please call if any questions or concerns.

## 2016-12-17 NOTE — Progress Notes (Signed)
Patient awakening,vss,report to rn 

## 2016-12-17 NOTE — Progress Notes (Signed)
No problems noted in the recovery room. Maw  Pt scheduled for EGD for 01-05-17 at 10:00.  Previsit 12-29-16 3:30 arrive by 3:15 on the 3rd floor. maw

## 2016-12-17 NOTE — Progress Notes (Signed)
Called to room to assist during endoscopic procedure.  Patient ID and intended procedure confirmed with present staff. Received instructions for my participation in the procedure from the performing physician.  

## 2016-12-18 ENCOUNTER — Telehealth: Payer: Self-pay | Admitting: *Deleted

## 2016-12-18 NOTE — Telephone Encounter (Signed)
  Follow up Call-  Call back number 12/17/2016  Post procedure Call Back phone  # [phone] to talk with wife  Permission to leave phone message Yes  Some recent data might be hidden     Patient questions:  Do you have a fever, pain , or abdominal swelling? No. Pain Score  0 *  Have you tolerated food without any problems? Yes.    Have you been able to return to your normal activities? Yes.    Do you have any questions about your discharge instructions: Diet   No. Medications  No. Follow up visit  No.  Do you have questions or concerns about your Care? No.  Actions: * If pain score is 4 or above: No action needed, pain <4.

## 2016-12-29 ENCOUNTER — Encounter: Payer: Self-pay | Admitting: Internal Medicine

## 2016-12-29 ENCOUNTER — Ambulatory Visit (AMBULATORY_SURGERY_CENTER): Payer: Self-pay

## 2016-12-29 VITALS — Ht 68.5 in | Wt 250.8 lb

## 2016-12-29 DIAGNOSIS — R1012 Left upper quadrant pain: Secondary | ICD-10-CM

## 2016-12-29 DIAGNOSIS — Z86010 Personal history of colonic polyps: Secondary | ICD-10-CM

## 2016-12-29 NOTE — Progress Notes (Signed)
No allergies to eggs or soy No diet meds No home oxygen No past problems with anesthesia  Registered for emmi 

## 2016-12-29 NOTE — Progress Notes (Signed)
Diminutive adenoma No recall due to age

## 2016-12-30 ENCOUNTER — Encounter: Payer: Self-pay | Admitting: Internal Medicine

## 2017-01-05 ENCOUNTER — Ambulatory Visit (AMBULATORY_SURGERY_CENTER): Payer: Medicare Other | Admitting: Internal Medicine

## 2017-01-05 ENCOUNTER — Encounter: Payer: Self-pay | Admitting: Internal Medicine

## 2017-01-05 VITALS — BP 132/70 | HR 79 | Temp 98.9°F | Resp 19 | Ht 68.5 in | Wt 250.0 lb

## 2017-01-05 DIAGNOSIS — R1012 Left upper quadrant pain: Secondary | ICD-10-CM

## 2017-01-05 DIAGNOSIS — M159 Polyosteoarthritis, unspecified: Secondary | ICD-10-CM

## 2017-01-05 DIAGNOSIS — K219 Gastro-esophageal reflux disease without esophagitis: Secondary | ICD-10-CM | POA: Diagnosis not present

## 2017-01-05 DIAGNOSIS — I10 Essential (primary) hypertension: Secondary | ICD-10-CM | POA: Diagnosis not present

## 2017-01-05 MED ORDER — SODIUM CHLORIDE 0.9 % IV SOLN: 500.0000 mL | INTRAVENOUS | Status: DC

## 2017-01-05 MED ORDER — DICLOFENAC SODIUM 1 % TD GEL: 2.0000 g | Freq: Four times a day (QID) | TRANSDERMAL | 1 refills | Status: DC

## 2017-01-05 NOTE — Progress Notes (Signed)
Report to PACU, RN, vss, BBS= Clear.  

## 2017-01-05 NOTE — Op Note (Addendum)
[facility] Patient Name: Alfons Sulkowski Procedure Date: 01/05/2017 9:23 AM MRN: [id number] Endoscopist: Gatha Mayer , MD Age: 82 Referring MD:  Date of Birth: Dec 08, 1934 Gender: Male Account #: [id number] Procedure:                Upper GI endoscopy Indications:              Abdominal pain in the left upper quadrant Medicines:                Propofol per Anesthesia, Monitored Anesthesia Care Procedure:                Pre-Anesthesia Assessment:                           - Prior to the procedure, a History and Physical                            was performed, and patient medications and                            allergies were reviewed. The patient's tolerance of                            previous anesthesia was also reviewed. The risks                            and benefits of the procedure and the sedation                            options and risks were discussed with the patient.                            All questions were answered, and informed consent                            was obtained. Prior Anticoagulants: The patient has                            taken no previous anticoagulant or antiplatelet                            agents. ASA Grade Assessment: III - A patient with                            severe systemic disease. After reviewing the risks                            and benefits, the patient was deemed in                            satisfactory condition to undergo the procedure.                           After obtaining informed consent, the endoscope was  passed under direct vision. Throughout the                            procedure, the patient's blood pressure, pulse, and                            oxygen saturations were monitored continuously. The                            Model GIF-HQ190 [id number]) scope was introduced                            through the mouth, and advanced to the second part                  of duodenum. The upper GI endoscopy was                            accomplished without difficulty. The patient                            tolerated the procedure well. Scope In: Scope Out: Findings:                 The esophagus was normal.                           The stomach was normal.                           The examined duodenum was normal. Complications:            No immediate complications. Estimated Blood Loss:     Estimated blood loss: none. Impression:               - Normal esophagus.                           - Normal stomach.                           - Normal examined duodenum.                           - No specimens collected. Recommendation:           - Resume previous diet.                           - Continue present medications. Gatha Mayer, MD 01/05/2017 9:54:29 AM This report has been signed electronically.

## 2017-01-05 NOTE — Progress Notes (Signed)
No problems noted in the recovery room. maw 

## 2017-01-05 NOTE — Patient Instructions (Addendum)
This exam is also ok which means that nothing bad has been found as a cause of this paion.  ? If its ribs or in abdominal wall area.  We will try diclofenac gel.  Please call and make an appointment to see me in June (call now)  I appreciate the opportunity to care for you. Gatha Mayer, MD, FACG   YOU HAD AN ENDOSCOPIC PROCEDURE TODAY AT [facility]:   Refer to the procedure report that was given to you for any specific questions about what was found during the examination.  If the procedure report does not answer your questions, please call your gastroenterologist to clarify.  If you requested that your care partner not be given the details of your procedure findings, then the procedure report has been included in a sealed envelope for you to review at your convenience later.  YOU SHOULD EXPECT: Some feelings of bloating in the abdomen. Passage of more gas than usual.  Walking can help get rid of the air that was put into your GI tract during the procedure and reduce the bloating. If you had a lower endoscopy (such as a colonoscopy or flexible sigmoidoscopy) you may notice spotting of blood in your stool or on the toilet paper. If you underwent a bowel prep for your procedure, you may not have a normal bowel movement for a few days.  Please Note:  You might notice some irritation and congestion in your nose or some drainage.  This is from the oxygen used during your procedure.  There is no need for concern and it should clear up in a day or so.  SYMPTOMS TO REPORT IMMEDIATELY:    Following upper endoscopy (EGD)  Vomiting of blood or coffee ground material  New chest pain or pain under the shoulder blades  Painful or persistently difficult swallowing  New shortness of breath  Fever of 100F or higher  Black, tarry-looking stools  For urgent or emergent issues, a gastroenterologist can be reached at any hour by calling [phone].   DIET:  We do  recommend a small meal at first, but then you may proceed to your regular diet.  Drink plenty of fluids but you should avoid alcoholic beverages for 24 hours.  ACTIVITY:  You should plan to take it easy for the rest of today and you should NOT DRIVE or use heavy machinery until tomorrow (because of the sedation medicines used during the test).    FOLLOW UP: Our staff will call the number listed on your records the next business day following your procedure to check on you and address any questions or concerns that you may have regarding the information given to you following your procedure. If we do not reach you, we will leave a message.  However, if you are feeling well and you are not experiencing any problems, there is no need to return our call.  We will assume that you have returned to your regular daily activities without incident.  If any biopsies were taken you will be contacted by phone or by letter within the next 1-3 weeks.  Please call us at [phone] if you have not heard about the biopsies in 3 weeks.    SIGNATURES/CONFIDENTIALITY: You and/or your care partner have signed paperwork which will be entered into your electronic medical record.  These signatures attest to the fact that that the information above on your After Visit Summary has been reviewed and is understood.  Full responsibility of the confidentiality of this discharge information lies with you and/or your care-partner.    Your blood sugar was 192 in the recovery room. You may resume your current medications today. Rx was sent to your pharmacy to be picked up. Please call if any questions or concerns.

## 2017-01-05 NOTE — Progress Notes (Signed)
No changes since pre-visit.  Poor historian.

## 2017-01-06 ENCOUNTER — Telehealth: Payer: Self-pay | Admitting: *Deleted

## 2017-01-06 NOTE — Telephone Encounter (Signed)
  Follow up Call-  Call back number 01/05/2017 12/17/2016  Post procedure Call Back phone  # [phone] to talk with wife  Permission to leave phone message Yes Yes  Some recent data might be hidden     Patient questions:  Do you have a fever, pain , or abdominal swelling? No. Pain Score  0 *  Have you tolerated food without any problems? Yes.    Have you been able to return to your normal activities? Yes.    Do you have any questions about your discharge instructions: Diet   No. Medications  No. Follow up visit  No.  Do you have questions or concerns about your Care? No.  Actions: * If pain score is 4 or above: No action needed, pain <4.

## 2017-01-12 ENCOUNTER — Other Ambulatory Visit: Payer: Self-pay | Admitting: Internal Medicine

## 2017-01-12 DIAGNOSIS — E119 Type 2 diabetes mellitus without complications: Secondary | ICD-10-CM

## 2017-01-12 MED ORDER — GLIPIZIDE 5 MG PO TABS: ORAL_TABLET | ORAL | 1 refills | Status: DC

## 2017-02-20 ENCOUNTER — Encounter: Payer: Self-pay | Admitting: Internal Medicine

## 2017-03-16 DIAGNOSIS — R632 Polyphagia: Secondary | ICD-10-CM | POA: Insufficient documentation

## 2017-03-16 NOTE — Patient Instructions (Addendum)

## 2017-03-16 NOTE — Progress Notes (Signed)
[facility]   Unk Pinto, M.D.      Uvaldo Bristle. Silverio Lay, P.A.-C [facility]                [address] [place], [place] [address] Telephone [phone] Telefax [phone]  Comprehensive Evaluation & Examination     This very nice 82 y.o. MWM presents for a comprehensive evaluation and management of multiple medical co-morbidities.  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 Patient also has GERD controlled with his meds.     Today patient is also c/o bilateral knee pains exacerbated with walking.   Patient is treated for HTN (1985)  & BP has been controlled at home. Today's BP is at goal - 136/64. Patient has had no complaints of any cardiac type chest pain, palpitations, dyspnea/orthopnea/PND, dizziness, claudication, or dependent edema.     Hyperlipidemia is controlled with diet & meds. Patient denies myalgias or other med SE's. Last Lipids were at goal albeit elevated Trig's: Lab Results  Component Value Date   CHOL 116 12/12/2016   HDL 34 (L) 12/12/2016   LDLCALC 42 12/12/2016   TRIG 198 (H) 12/12/2016   CHOLHDL 3.4 12/12/2016      Also, the patient has history of Morbid Obesity (BMI 36+) and T2_NIDDM (1999) w/CKD3 (GFR 42) and has had no symptoms of reactive hypoglycemia, diabetic polys, paresthesias or visual blurring.  Patient has Gluttony and is not compliant with diet and last A1c was not at goal: Lab Results  Component Value Date   HGBA1C 8.8 (H) 12/12/2016      Further, the patient also has history of Vitamin D Deficiency ("32" in 2008) and supplements vitamin D without any suspected side-effects. Last vitamin D was still very low:  Lab Results  Component Value Date   VD25OH 44 01/18/2016      Current Outpatient Prescriptions on File Prior to Visit  Medication Sig  . aspirin 81 MG tablet Take 1 tablet (81 mg total) by mouth  daily. Stop and restart on 09/04/11  . Cinnamon 500 MG TABS Take 1,000 mg by mouth.  . doxazosin (CARDURA) 8 MG tablet TAKE 1 TABLET BY MOUTH EVERY DAY FOR BLOOD PRESSURE/PROSTATE  . glipiZIDE (GLUCOTROL) 5 MG tablet Take 1/2 to 1 tablet 3 x / day with meals for Diabetes  . hydrochlorothiazide (HYDRODIURIL) 25 MG tablet TAKE 1 TABLET BY MOUTH EVERY DAY FOR BLOOD PRESSURE AND FLUID  . lisinopril (PRINIVIL,ZESTRIL) 40 MG tablet TAKE 1 TABLET BY MOUTH DAILY FOR BLOOD PRESSURE AND DIABETIC KIDNEY PROTECTION  . magnesium oxide (MAG-OX) 400 MG tablet Take 400 mg by mouth daily.  . metFORMIN (GLUCOPHAGE-XR) 500 MG 24 hr tablet TAKE 2 TABLETS BY MOUTH TWICE DAILY  . Multiple Vitamins-Minerals (MULTIVITAMIN WITH MINERALS) tablet Take 1 tablet by mouth daily.    . Nutritional Supplements (VITAMIN D MAINTENANCE PO) Take 10,000 mg by mouth daily.    . pravastatin (PRAVACHOL) 40 MG tablet TAKE 1 TABLET BY MOUTH AT BEDTIME FOR CHOLESTEROL   No current facility-administered medications on file prior to visit.    No Known Allergies Past Medical History:  Diagnosis Date  . Arthritis   . BPH (benign prostatic hyperplasia)   . Cataract   . CKD (chronic kidney disease), stage III   . Depression    "usually a happy go lucky"  . Diabetes  mellitus   . Diverticulosis   . GERD (gastroesophageal reflux disease)    sometimes will take omeprazole  . Hyperlipidemia   . Hypertension    Health Maintenance  Topic Date Due  . OPHTHALMOLOGY EXAM  09/09/2015  . FOOT EXAM  01/17/2017  . INFLUENZA VACCINE  04/22/2017  . HEMOGLOBIN A1C  06/14/2017  . COLONOSCOPY  12/18/2019  . TETANUS/TDAP  08/30/2025  . PNA vac Low Risk Adult  Completed   Immunization History  Administered Date(s) Administered  . DT 08/31/2015  . Influenza Whole 05/24/2011, 08/06/2012, 07/07/2013  . Influenza, High Dose Seasonal PF 06/21/2014, 08/31/2015, 06/03/2016  . Pneumococcal Conjugate-13 06/21/2014  . Pneumococcal Polysaccharide-23  03/10/2005   Past Surgical History:  Procedure Laterality Date  . CHOLECYSTECTOMY N/A 11/29/2015   Procedure: LAPAROSCOPIC CHOLECYSTECTOMY WITH INTRAOPERATIVE CHOLANGIOGRAM;  Surgeon: Erroll Luna, MD;  Location: [facility] OR;  Service: General;  Laterality: N/A;  . COLONOSCOPY  2005, 08/21/11   2005 tubulovillous adenoma and adenomas 2012 - 3 adenomas, largest 10 mm, diverticulosis  . FRACTURE SURGERY     ? right foot as a child  . KNEE ARTHROSCOPY    . TONSILLECTOMY AND ADENOIDECTOMY    . VEIN LIGATION AND STRIPPING     left   Family History  Problem Relation Age of Onset  . Prostate cancer Brother   . Diabetes Brother   . Colon cancer Neg Hx    Social History   Social History  . Marital status: Married    Spouse name: N/A  . Number of children: 3  . Years of education: N/A   Occupational History  . retired    Social History Main Topics  . Smoking status: Current Some Day Smoker    Types: Cigars    Last attempt to quit: 09/23/1987  . Smokeless tobacco: Never Used     Comment: once monthly  . Alcohol use Yes     Comment: once monthly  . Drug use: No  . Sexual activity: Not on file    ROS Constitutional: Denies fever, chills, weight loss/gain, headaches, insomnia,  night sweats or change in appetite. Does c/o fatigue. Eyes: Denies redness, blurred vision, diplopia, discharge, itchy or watery eyes.  ENT: Denies discharge, congestion, post nasal drip, epistaxis, sore throat, earache, hearing loss, dental pain, Tinnitus, Vertigo, Sinus pain or snoring.  Cardio: Denies chest pain, palpitations, irregular heartbeat, syncope, dyspnea, diaphoresis, orthopnea, PND, claudication or edema Respiratory: denies cough, dyspnea, DOE, pleurisy, hoarseness, laryngitis or wheezing.  Gastrointestinal: Denies dysphagia, heartburn, reflux, water brash, pain, cramps, nausea, vomiting, bloating, diarrhea, constipation, hematemesis, melena, hematochezia, jaundice or hemorrhoids Genitourinary: Denies  dysuria, frequency, urgency, nocturia, hesitancy, discharge, hematuria or flank pain Musculoskeletal: c/o bilat knee pains worse w/activity. Denies Falls. Skin: Denies puritis, rash, hives, warts, acne, eczema or change in skin lesion Neuro: No weakness, tremor, incoordination, spasms, paresthesia or pain Psychiatric: Denies confusion, memory loss or sensory loss. Denies Depression. Endocrine: Denies change in weight, skin, hair change, nocturia, and paresthesia, diabetic polys, visual blurring or hyper / hypo glycemic episodes.  Heme/Lymph: No excessive bleeding, bruising or enlarged lymph nodes.  Physical Exam  BP 136/64   Pulse 88   Temp 97 F (36.1 C)   Resp 16   Ht 5\' 9"  (1.753 m)   Wt 250 lb 3.2 oz (113.5 kg)   BMI 36.95 kg/m   General Appearance: Over nourished and in no apparent distress.  Eyes: PERRLA, EOMs, conjunctiva no swelling or erythema, normal fundi and vessels. Sinuses: No  frontal/maxillary tenderness ENT/Mouth: EACs patent / TMs  nl. Nares clear without erythema, swelling, mucoid exudates. Oral hygiene is good. No erythema, swelling, or exudate. Tongue normal, non-obstructing. Tonsils not swollen or erythematous. Hearing normal.  Neck: Supple, thyroid normal. No bruits, nodes or JVD. Respiratory: Respiratory effort normal.  BS equal and clear bilateral without rales, rhonci, wheezing or stridor. Cardio: Heart sounds are normal with regular rate and rhythm and no murmurs, rubs or gallops. Peripheral pulses are normal and equal bilaterally without edema. No aortic or femoral bruits. Chest: symmetric with normal excursions and percussion.  Abdomen: Soft, with Nl bowel sounds. Nontender, no guarding, rebound, hernias, masses, or organomegaly.  Lymphatics: Non tender without lymphadenopathy.  Genitourinary: DRE - deferred at patient request for recent negative Colonoscopy (12/17/2016). Musculoskeletal: Full ROM all peripheral extremities, joint stability, 5/5 strength,  and normal gait. Skin: Warm and dry without rashes, lesions, cyanosis, clubbing or  ecchymosis.  Neuro: Cranial nerves intact, reflexes equal bilaterally. Normal muscle tone, no cerebellar symptoms. Sensation intact to touch, vibratory and Monofilament to the toes bilaterally.  Pysch: Alert and oriented X 3 with normal affect, insight and judgment appropriate.   Assessment and Plan         Patient was counseled in prudent diet, weight control to achieve/maintain BMI less than 25, BP monitoring, regular exercise and medications as discussed.  Discussed med effects and SE's. Routine screening labs and tests as requested with regular follow-up as recommended. Over 40 minutes of exam, counseling, chart review and high complex critical decision making was performed

## 2017-03-17 ENCOUNTER — Ambulatory Visit (INDEPENDENT_AMBULATORY_CARE_PROVIDER_SITE_OTHER): Payer: Medicare Other | Admitting: Internal Medicine

## 2017-03-17 ENCOUNTER — Other Ambulatory Visit: Payer: Self-pay | Admitting: *Deleted

## 2017-03-17 ENCOUNTER — Encounter: Payer: Self-pay | Admitting: Internal Medicine

## 2017-03-17 VITALS — BP 136/64 | HR 88 | Temp 97.0°F | Resp 16 | Ht 69.0 in | Wt 250.2 lb

## 2017-03-17 DIAGNOSIS — E559 Vitamin D deficiency, unspecified: Secondary | ICD-10-CM | POA: Diagnosis not present

## 2017-03-17 DIAGNOSIS — E1121 Type 2 diabetes mellitus with diabetic nephropathy: Secondary | ICD-10-CM | POA: Diagnosis not present

## 2017-03-17 DIAGNOSIS — Z1212 Encounter for screening for malignant neoplasm of rectum: Secondary | ICD-10-CM

## 2017-03-17 DIAGNOSIS — Z125 Encounter for screening for malignant neoplasm of prostate: Secondary | ICD-10-CM | POA: Diagnosis not present

## 2017-03-17 DIAGNOSIS — Z79899 Other long term (current) drug therapy: Secondary | ICD-10-CM | POA: Diagnosis not present

## 2017-03-17 DIAGNOSIS — R632 Polyphagia: Secondary | ICD-10-CM | POA: Diagnosis not present

## 2017-03-17 DIAGNOSIS — M25562 Pain in left knee: Secondary | ICD-10-CM

## 2017-03-17 DIAGNOSIS — M25561 Pain in right knee: Secondary | ICD-10-CM

## 2017-03-17 DIAGNOSIS — E782 Mixed hyperlipidemia: Secondary | ICD-10-CM | POA: Diagnosis not present

## 2017-03-17 DIAGNOSIS — Z136 Encounter for screening for cardiovascular disorders: Secondary | ICD-10-CM | POA: Diagnosis not present

## 2017-03-17 DIAGNOSIS — I10 Essential (primary) hypertension: Secondary | ICD-10-CM

## 2017-03-17 DIAGNOSIS — G8929 Other chronic pain: Secondary | ICD-10-CM

## 2017-03-17 LAB — CBC WITH DIFFERENTIAL/PLATELET
Basophils Absolute: 0 cells/uL (ref 0–200)
Basophils Relative: 0 %
Eosinophils Absolute: 276 cells/uL (ref 15–500)
Eosinophils Relative: 4 %
HCT: 39.7 % (ref 38.5–50.0)
Hemoglobin: 13.1 g/dL — ABNORMAL LOW (ref 13.2–17.1)
Lymphocytes Relative: 23 %
Lymphs Abs: 1587 cells/uL (ref 850–3900)
MCH: 29.2 pg (ref 27.0–33.0)
MCHC: 33 g/dL (ref 32.0–36.0)
MCV: 88.6 fL (ref 80.0–100.0)
MPV: 10.6 fL (ref 7.5–12.5)
Monocytes Absolute: 759 cells/uL (ref 200–950)
Monocytes Relative: 11 %
Neutro Abs: 4278 cells/uL (ref 1500–7800)
Neutrophils Relative %: 62 %
Platelets: 174 10*3/uL (ref 140–400)
RBC: 4.48 MIL/uL (ref 4.20–5.80)
RDW: 14 % (ref 11.0–15.0)
WBC: 6.9 10*3/uL (ref 3.8–10.8)

## 2017-03-17 LAB — TSH: TSH: 2.25 mIU/L (ref 0.40–4.50)

## 2017-03-17 MED ORDER — FREESTYLE SYSTEM KIT: PACK | 0 refills | Status: DC

## 2017-03-17 MED ORDER — GLUCOSE BLOOD VI STRP: ORAL_STRIP | 5 refills | Status: DC

## 2017-03-17 MED ORDER — FREESTYLE LANCETS MISC: 5 refills | Status: DC

## 2017-03-18 DIAGNOSIS — E1122 Type 2 diabetes mellitus with diabetic chronic kidney disease: Secondary | ICD-10-CM | POA: Diagnosis not present

## 2017-03-18 LAB — URINALYSIS, ROUTINE W REFLEX MICROSCOPIC
Bilirubin Urine: NEGATIVE
Glucose, UA: NEGATIVE
Hgb urine dipstick: NEGATIVE
Ketones, ur: NEGATIVE
Leukocytes, UA: NEGATIVE
Nitrite: NEGATIVE
Protein, ur: NEGATIVE
Specific Gravity, Urine: 1.02 (ref 1.001–1.035)
pH: 5 (ref 5.0–8.0)

## 2017-03-18 LAB — BASIC METABOLIC PANEL WITH GFR
BUN: 25 mg/dL (ref 7–25)
CO2: 22 mmol/L (ref 20–31)
Calcium: 9.3 mg/dL (ref 8.6–10.3)
Chloride: 103 mmol/L (ref 98–110)
Creat: 1.72 mg/dL — ABNORMAL HIGH (ref 0.70–1.18)
GFR, Est African American: 43 mL/min — ABNORMAL LOW (ref >=60)
GFR, Est Non African American: 37 mL/min — ABNORMAL LOW (ref >=60)
Glucose, Bld: 155 mg/dL — ABNORMAL HIGH (ref 65–99)
Potassium: 4.9 mmol/L (ref 3.5–5.3)
Sodium: 139 mmol/L (ref 135–146)

## 2017-03-18 LAB — HEPATIC FUNCTION PANEL
ALT: 20 U/L (ref 9–46)
AST: 22 U/L (ref 10–35)
Albumin: 4.1 g/dL (ref 3.6–5.1)
Alkaline Phosphatase: 53 U/L (ref 40–115)
Bilirubin, Direct: 0.1 mg/dL (ref <=0.2)
Indirect Bilirubin: 0.4 mg/dL (ref 0.2–1.2)
Total Bilirubin: 0.5 mg/dL (ref 0.2–1.2)
Total Protein: 6.5 g/dL (ref 6.1–8.1)

## 2017-03-18 LAB — MAGNESIUM: Magnesium: 1.8 mg/dL (ref 1.5–2.5)

## 2017-03-18 LAB — VITAMIN D 25 HYDROXY (VIT D DEFICIENCY, FRACTURES): Vit D, 25-Hydroxy: 54 ng/mL (ref 30–100)

## 2017-03-18 LAB — HEMOGLOBIN A1C
Hgb A1c MFr Bld: 8.3 % — ABNORMAL HIGH (ref <5.7)
Mean Plasma Glucose: 192 mg/dL

## 2017-03-18 LAB — MICROALBUMIN / CREATININE URINE RATIO
Creatinine, Urine: 148 mg/dL (ref 20–370)
Microalb Creat Ratio: 5 mcg/mg creat (ref <30)
Microalb, Ur: 0.8 mg/dL

## 2017-03-18 LAB — PSA: PSA: 1.1 ng/mL (ref <=4.0)

## 2017-03-18 LAB — LIPID PANEL
Cholesterol: 131 mg/dL (ref <200)
HDL: 35 mg/dL — ABNORMAL LOW (ref >40)
LDL Cholesterol: 33 mg/dL (ref <100)
Total CHOL/HDL Ratio: 3.7 Ratio (ref <5.0)
Triglycerides: 317 mg/dL — ABNORMAL HIGH (ref <150)
VLDL: 63 mg/dL — ABNORMAL HIGH (ref <30)

## 2017-03-18 LAB — INSULIN, RANDOM: Insulin: 15.6 u[IU]/mL (ref 2.0–19.6)

## 2017-03-23 ENCOUNTER — Other Ambulatory Visit: Payer: Self-pay | Admitting: Internal Medicine

## 2017-04-01 ENCOUNTER — Ambulatory Visit (INDEPENDENT_AMBULATORY_CARE_PROVIDER_SITE_OTHER): Payer: Medicare Other | Admitting: Orthopedic Surgery

## 2017-04-01 ENCOUNTER — Ambulatory Visit (INDEPENDENT_AMBULATORY_CARE_PROVIDER_SITE_OTHER): Payer: Medicare Other

## 2017-04-01 DIAGNOSIS — G8929 Other chronic pain: Secondary | ICD-10-CM

## 2017-04-01 DIAGNOSIS — M25562 Pain in left knee: Secondary | ICD-10-CM

## 2017-04-01 DIAGNOSIS — M25561 Pain in right knee: Secondary | ICD-10-CM

## 2017-04-02 ENCOUNTER — Encounter (INDEPENDENT_AMBULATORY_CARE_PROVIDER_SITE_OTHER): Payer: Self-pay | Admitting: Orthopedic Surgery

## 2017-04-02 NOTE — Progress Notes (Signed)
Office Visit Note   Patient: Edwin Osborne           Date of Birth: Feb 14, 1935           MRN: [id number] Visit Date: 04/01/2017 Requested by: Unk Pinto, [address] [place], [place] [address] PCP: Unk Pinto, MD  Subjective: Chief Complaint  Patient presents with  . Left Knee - Pain  . Right Knee - Pain    HPI: Edwin Osborne is a 82 year old patient with bilateral knee pain.  He states that one is not worse than the other.  States the pain is constant.  He's had arthroscopy on the left knee 25 years ago.  Reports no swelling but does report locking and popping as well as weakness giving way as well as some waking from sleep at night with pain.  He can walk about a city block.  He wants to deal to do more.  He has family that he spends time with and he would like to be more active with the younger members of his family.  He is realistic about his age and expectations in terms of activity level              ROS: All systems reviewed are negative as they relate to the chief complaint within the history of present illness.  Patient denies  fevers or chills.   Assessment & Plan: Visit Diagnoses:  1. Chronic pain of both knees     Plan: Impression is bilateral knee arthritis with pretty severe tricompartment arthritis worse on the medial side.  Plan is we talked about knee replacement.  He doesn't really want to go through with that because of the mixed results for these occurred from different patients.  I think he flexes injections would be a consideration but he wants to hold off on that as well.  In general he's only with what he has.  I did tell him that weight loss would give him very predictable but marginal pain relief in the knees and increase his activity level.  He states is difficult to do because his wife is such a good Cook.  Plan is observation.  If he wants to get the gel injections he should call and we can get this preapproved.  Otherwise and will  see him back as needed   Follow-Up Instructions: Return if symptoms worsen or fail to improve.   Orders:  Orders Placed This Encounter  Procedures  . XR KNEE 3 VIEW RIGHT  . XR KNEE 3 VIEW LEFT   No orders of the defined types were placed in this encounter.     Procedures: No procedures performed   Clinical Data: No additional findings.  Objective: Vital Signs: There were no vitals taken for this visit.  Physical Exam:   Constitutional: Patient appears well-developed HEENT:  Head: Normocephalic Eyes:EOM are normal Neck: Normal range of motion Cardiovascular: Normal rate Pulmonary/chest: Effort normal Neurologic: Patient is alert Skin: Skin is warm Psychiatric: Patient has normal mood and affect    Ortho Exam: Orthopedic exam demonstrates slight varus alignment bilateral extremities with palpable pedal pulses intact extensor mechanism 5 flexion contracture in both knees.  Collateral and cruciate ligaments are stable.  No groin pain with internal/external rotation of the leg.  Extensor mechanism is intact.  No other masses lymph adenopathy or skin changes noted in bilateral knee regions.  Specialty Comments:  No specialty comments available.  Imaging: Xr Knee 3 View Left  Result Date: 04/02/2017  AP lateral merchant view left knee reviewed.  End-stage tricompartmental knee arthritis is present worse on the medial side with varus alignment and bone-on-bone changes in the medial compartment  Xr Knee 3 View Right  Result Date: 04/02/2017 AP lateral merchant right knee reviewed.  Tricompartmental osteoarthritis is present worse on the medial compartment with bone-on-bone changes and obliteration of the medial joint space.  Varus alignment present.    PMFS History: Patient Active Problem List   Diagnosis Date Noted  . Gluttony 03/16/2017  . Screening for ischemic heart disease 03/16/2017  . 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 T2_NIDDM w/CKD3 (GFR  40 ml/min) 03/07/2014  . Screening for AAA (aortic abdominal aneurysm) 03/07/2014  . Non compliance with medical treatment 03/07/2014  . Morbid obesity (BMI 36.5) 03/07/2014  . Hyperlipidemia 08/15/2013  . Vitamin D deficiency 08/15/2013  . Hypertension   . History of colonic polyps 04/09/2004   Past Medical History:  Diagnosis Date  . Arthritis   . BPH (benign prostatic hyperplasia)   . Cataract   . CKD (chronic kidney disease), stage III   . Depression    "usually a happy go lucky"  . Diabetes mellitus   . Diverticulosis   . GERD (gastroesophageal reflux disease)    sometimes will take omeprazole  . Hyperlipidemia   . Hypertension     Family History  Problem Relation Age of Onset  . Prostate cancer Brother   . Diabetes Brother   . Colon cancer Neg Hx     Past Surgical History:  Procedure Laterality Date  . CHOLECYSTECTOMY N/A 11/29/2015   Procedure: LAPAROSCOPIC CHOLECYSTECTOMY WITH INTRAOPERATIVE CHOLANGIOGRAM;  Surgeon: Erroll Luna, MD;  Location: [facility] OR;  Service: General;  Laterality: N/A;  . COLONOSCOPY  2005, 08/21/11   2005 tubulovillous adenoma and adenomas 2012 - 3 adenomas, largest 10 mm, diverticulosis  . FRACTURE SURGERY     ? right foot as a child  . KNEE ARTHROSCOPY    . TONSILLECTOMY AND ADENOIDECTOMY    . VEIN LIGATION AND STRIPPING     left   Social History   Occupational History  . retired    Social History Main Topics  . Smoking status: Current Some Day Smoker    Types: Cigars    Last attempt to quit: 09/23/1987  . Smokeless tobacco: Never Used     Comment: once monthly  . Alcohol use Yes     Comment: once monthly  . Drug use: No  . Sexual activity: Not on file

## 2017-04-08 ENCOUNTER — Other Ambulatory Visit: Payer: Self-pay | Admitting: Internal Medicine

## 2017-05-02 ENCOUNTER — Other Ambulatory Visit: Payer: Self-pay | Admitting: Internal Medicine

## 2017-05-16 ENCOUNTER — Other Ambulatory Visit: Payer: Self-pay | Admitting: Internal Medicine

## 2017-05-28 ENCOUNTER — Other Ambulatory Visit: Payer: Self-pay | Admitting: Internal Medicine

## 2017-07-06 ENCOUNTER — Other Ambulatory Visit: Payer: Self-pay | Admitting: Physician Assistant

## 2017-07-14 NOTE — Progress Notes (Signed)
Edwin Osborne AND 3 MONTH FOLLOW UP  Assessment and Plan:   Encounter for Annual Medicare Wellness Visit  Hypertension Continue medication:  Lisinopril 40 mg, hctz 25 mg Monitor blood pressure at home. Continue DASH diet.   Reminder to go to the ER if any CP, SOB, nausea, dizziness, severe HA, changes vision/speech, left arm numbness and tingling and jaw pain.  Cholesterol Continue medication: pravastatin 40 mg  Continue diet and exercise.  Check lipid panel.   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  Check A1C  Obesity with co morbidities Long discussion about weight loss, diet, and exercise Discussed ideal weight for height (below 176) and initial weight goal (245) Patient will work on increasing vegetables and increase length and frequency of exercise Will follow up in 3 months  Vitamin D Def/ osteoporosis prevention Continue supplementation Check Vit D level  Type 2 diabetes mellitus with complication, without long-term current use of insulin (HCC) Continue medications: metformin 500 mg 2 tab BID, glipizide 5 mg 1/2-1 tab BID, cinnamon 1000 mg BID Long discussion about dietary choices and physical activity Perform daily foot/skin check, notify office of any concerning changes.  Check A1C  Stage 3 chronic kidney disease due to type 2 diabetes mellitus (HCC) Check BMP, encouraged improved adherence to diet, goal to reduce blood sugars from 150s to 130s by next visit Avoid NSAIDS Encouraged hydration 80-100 oz of water daily  Diabetic sensory polyneuropathy (HCC) No wounds at this time; foot exam completed.  Reminded patient to check feet daily for wounds Patient denies podiatry referral  today but will consider in the future  History of colonic polyps Patient refuses follow up colonoscopy, discussed cologuard which patient refuses at this time, will continue to recommend Continue ASA daily  Non compliance with medical treatment Discussed barriers, patient unwilling to make significant changes in diet, but agrees to increase exercise somewhat  Gluttony Discussed concerns with current diet and impact on long term outcomes related to diabetes and cardiovascular health.  Medication management -     CBC with Differential/Platelet -     BASIC METABOLIC PANEL WITH GFR -     Hepatic function panel  Need for influenza vaccination Flu vaccine given today  Continue diet and meds as discussed. Further disposition pending results of labs. Discussed med's effects and SE's.   Over 30 minutes of exam, counseling, chart review, and critical decision making was performed.   Future Appointments Date Time Provider [place]  10/15/2017 9:30 AM Unk Pinto, MD [facility] None  04/20/2018 10:00 AM Unk Pinto, MD [facility] None   During the course of the visit the patient was educated and counseled about appropriate screening and preventive services including:    Pneumococcal vaccine   Influenza vaccine  Td vaccine  Screening electrocardiogram  Colorectal cancer screening  Diabetes screening  Glaucoma screening  Nutrition counseling    ----------------------------------------------------------------------------------------------------------------------  HPI 82 y.o. male  presents for 3 month follow up on hypertension, cholesterol, diabetes with CKD and neuropathy, weight and vitamin D deficiency. The patient has been exercising (10 min on stationary bike twice a week), but has not made significant changes to his diet. He does not present with a sugar log, but reports they have been running "about 150" - reports this  AM fasting was 147. The patient  discusses his wife is a Systems developer, and he has no intentions of telling her how to do things. He apparently does enjoy vegetables, encouraged to increase these (avoid fried varieties) if he has no intention of cutting down on breads and potatoes.   BMI is Body mass index is 37.66 kg/m., he has not been working on diet, but is riding a stationary bike twice a week. Wt Readings from Last 3 Encounters:  07/15/17 255 lb (115.7 kg)  03/17/17 250 lb 3.2 oz (113.5 kg)  01/05/17 250 lb (113.4 kg)   He does not check his BP at home, today their BP is BP: (!) 142/80   He does workout. He denies chest pain, shortness of breath, dizziness.   He is on cholesterol medication and denies myalgias. His cholesterol is not at goal. The cholesterol last visit was:   Lab Results  Component Value Date   CHOL 131 03/17/2017   HDL 35 (L) 03/17/2017   LDLCALC 33 03/17/2017   TRIG 317 (H) 03/17/2017   CHOLHDL 3.7 03/17/2017    He has been working exercise for diabetes but not diet, and denies hyperglycemia, hypoglycemia , increased appetite, nausea, polydipsia and polyuria. Last A1C in the office was improved from previous but above goal:  Lab Results  Component Value Date   HGBA1C 8.3 (H) 03/17/2017   Patient is on Vitamin D supplement but remains below goal:  Lab Results  Component Value Date   VD25OH 54 03/17/2017        Current Medications:  Current Outpatient Prescriptions on File Prior to Visit  Medication Sig  . aspirin 81 MG tablet Take 1 tablet (81 mg total) by mouth daily. Stop and restart on 09/04/11  . Cinnamon 500 MG TABS Take 1,000 mg by mouth.  . doxazosin (CARDURA) 8 MG tablet TAKE 1 TABLET BY MOUTH EVERY DAY FOR BLOOD PRESSURE/PROSTATE  . glipiZIDE (GLUCOTROL) 5 MG tablet Take 1/2 to 1 tablet 3 x / day with meals for Diabetes  . glucose blood (FREESTYLE TEST STRIPS) test strip Check blood sugar 1 time daily-DX-E11.22.  Marland Kitchen glucose monitoring kit (FREESTYLE) monitoring kit check blood  sugar 1 time daily-DX-E11.22.  . hydrochlorothiazide (HYDRODIURIL) 25 MG tablet TAKE 1 TABLET BY MOUTH EVERY DAY FOR BLOOD PRESSURE AND FLUID  . Lancets (FREESTYLE) lancets Check blood sugar 1 time daily-DX-E11.22.  . lisinopril (PRINIVIL,ZESTRIL) 40 MG tablet TAKE 1 TABLET BY MOUTH DAILY FOR BLOOD PRESSURE AND DIABETIC KIDNEY PROTECTION  . magnesium oxide (MAG-OX) 400 MG tablet Take 400 mg by mouth daily.  . metFORMIN (GLUCOPHAGE-XR) 500 MG 24 hr tablet TAKE 2 TABLETS BY MOUTH TWICE DAILY  . Multiple Vitamins-Minerals (MULTIVITAMIN WITH MINERALS) tablet Take 1 tablet by mouth daily.    . Nutritional Supplements (VITAMIN D MAINTENANCE PO) Take 10,000 mg by mouth daily.    . pravastatin (PRAVACHOL) 40 MG tablet TAKE 1 TABLET BY MOUTH AT BEDTIME FOR CHOLESTEROL   No current facility-administered medications on file prior to visit.     Medical History:  Past Medical History:  Diagnosis Date  . Arthritis   . BPH (benign prostatic hyperplasia)   . Cataract   . CKD (chronic kidney disease), stage III ([place])   . Depression    "usually a happy go lucky"  . Diabetes mellitus   . Diverticulosis   . GERD (gastroesophageal reflux disease)    sometimes will take omeprazole  . Hyperlipidemia   . Hypertension  Allergies No Known Allergies  SURGICAL HISTORY He  has a past surgical history that includes Tonsillectomy and adenoidectomy; Vein ligation and stripping; Knee arthroscopy; Colonoscopy (2005, 08/21/11); Fracture surgery; and Cholecystectomy (N/A, 11/29/2015). FAMILY HISTORY His family history includes Diabetes in his brother; Prostate cancer in his brother. SOCIAL HISTORY He  reports that he has been smoking Cigars.  He has never used smokeless tobacco. He reports that he drinks alcohol. He reports that he does not use drugs.  Preventative care: Last colonoscopy: 2012 due 2015 but will not have another, declines  Prior vaccinations: TD or Tdap: 2016 Influenza: 2017 TODAY -  given Pneumococcal: 2006 Prevnar13: 2015 Shingles/Zostavax: declines  Eye exam: last in 2016 - strongly encouraged to follow up for diabetic eye exam Dentist: last in 06/2017, follow up in 07/2017 for cavity care, has full upper partials.    MEDICARE WELLNESS OBJECTIVES: Physical activity: Current Exercise Habits: Home exercise routine, Type of exercise: Other - see comments (Bicycle), Time (Minutes): 10, Frequency (Times/Week): 2, Weekly Exercise (Minutes/Week): 20, Intensity: Mild, Exercise limited by: None identified Cardiac risk factors: Cardiac Risk Factors include: advanced age (>64mn, >>40women);diabetes mellitus;dyslipidemia;hypertension;male gender;obesity (BMI >30kg/m2);smoking/ tobacco exposure Depression/mood screen:   [facility] 07[facility]  Decreased Interest 0  Down, Depressed, Hopeless 0  PHQ - 2 Score 0    ADLs:  In your present state of health, do you have any difficulty performing the following activities: 07/15/2017 03/17/2017  Hearing? N N  Vision? N N  Difficulty concentrating or making decisions? N N  Walking or climbing stairs? N N  Dressing or bathing? N N  Doing errands, shopping? N N  Some recent data might be hidden     Cognitive Testing  Alert? Yes  Normal Appearance?Yes  Oriented to person? Yes  Place? Yes   Time? Yes  Recall of three objects?  Yes  Can perform simple calculations? Yes  Displays appropriate judgment?Yes  Can read the correct time from a watch face?Yes  EOL planning: Does Patient Have a Medical Advance Directive?: No Would patient like information on creating a medical advance directive?: No - Patient declined   Review of Systems:  Review of Systems  Constitutional: Negative for malaise/fatigue and weight loss.  HENT: Negative for hearing loss and tinnitus.   Eyes: Negative for blurred vision and double vision.  Respiratory: Negative for cough, sputum production, shortness of breath and wheezing.    Cardiovascular: Positive for leg swelling. Negative for chest pain, palpitations, orthopnea and claudication.  Gastrointestinal: Negative for abdominal pain, blood in stool, constipation, diarrhea, heartburn, melena, nausea and vomiting.  Genitourinary: Negative.   Musculoskeletal: Negative for falls, joint pain and myalgias.  Skin: Negative for rash.  Neurological: Positive for tingling (Intermittent in bilateral feet). Negative for dizziness, sensory change, weakness and headaches.  Endo/Heme/Allergies: Negative for polydipsia.  Psychiatric/Behavioral: Negative.  Negative for depression and memory loss. The patient is not nervous/anxious and does not have insomnia.   All other systems reviewed and are negative.   Physical Exam: BP (!) 142/80   Pulse 80   Temp (!) 97.5 F (36.4 C)   Ht '5\' 9"'$  (1.753 m)   Wt 255 lb (115.7 kg)   SpO2 94%   BMI 37.66 kg/m  Wt Readings from Last 3 Encounters:  07/15/17 255 lb (115.7 kg)  03/17/17 250 lb 3.2 oz (113.5 kg)  01/05/17 250 lb (113.4 kg)   General Appearance: Well nourished, obese, in no apparent distress. Eyes: PERRLA, EOMs, conjunctiva no swelling  or erythema Sinuses: No Frontal/maxillary tenderness ENT/Mouth: Ext aud canals clear, L TMs without erythema, bulging, R TM with extensive scarring, erythematous around bony structures (per the patient he has chronic drainage issue that has been cleared by ENT). No erythema, swelling, or exudate on post pharynx.  Tonsils not swollen or erythematous. Hearing normal.  Neck: Supple, thyroid normal.  Respiratory: Respiratory effort normal, BS equal bilaterally without rales, rhonchi, wheezing or stridor.  Cardio: RRR with no MRGs. 1+ peripheral pulses equally with 2+ edema bilateral ankles.  Abdomen: Soft, rounded obese abdomen + BS.  Non tender, no guarding, rebound, palpable hernias or masses. Lymphatics: Non tender without lymphadenopathy.  Musculoskeletal: Full ROM, 5/5 strength, Normal  gait Skin: Warm, dry without rashes, lesions, ecchymosis.  Neuro: Cranial nerves intact. No cerebellar symptoms. Sensation in heels deminished bilaterally to monofilament testing.  Psych: Awake and oriented X 3, normal affect, Insight and Judgment appropriate.   Medicare Attestation I have personally reviewed: The patient's medical and social history Their use of alcohol, tobacco or illicit drugs Their current medications and supplements The patient's functional ability including ADLs,fall risks, home safety risks, cognitive, and hearing and visual impairment Diet and physical activities Evidence for depression or mood disorders  The patient's weight, height, BMI, and visual acuity have been recorded in the chart.  I have made referrals, counseling, and provided education to the patient based on review of the above and I have provided the patient with a written personalized care plan for preventive services.    Izora Ribas, NP 1:01 PM [facility]

## 2017-07-15 ENCOUNTER — Ambulatory Visit (INDEPENDENT_AMBULATORY_CARE_PROVIDER_SITE_OTHER): Payer: Medicare Other | Admitting: Adult Health

## 2017-07-15 ENCOUNTER — Encounter: Payer: Self-pay | Admitting: Adult Health

## 2017-07-15 VITALS — BP 142/80 | HR 80 | Temp 97.5°F | Ht 69.0 in | Wt 255.0 lb

## 2017-07-15 DIAGNOSIS — Z23 Encounter for immunization: Secondary | ICD-10-CM

## 2017-07-15 DIAGNOSIS — Z91199 Patient's noncompliance with other medical treatment and regimen due to unspecified reason: Secondary | ICD-10-CM

## 2017-07-15 DIAGNOSIS — R632 Polyphagia: Secondary | ICD-10-CM

## 2017-07-15 DIAGNOSIS — E118 Type 2 diabetes mellitus with unspecified complications: Secondary | ICD-10-CM

## 2017-07-15 DIAGNOSIS — E559 Vitamin D deficiency, unspecified: Secondary | ICD-10-CM

## 2017-07-15 DIAGNOSIS — Z9119 Patient's noncompliance with other medical treatment and regimen: Secondary | ICD-10-CM | POA: Diagnosis not present

## 2017-07-15 DIAGNOSIS — I10 Essential (primary) hypertension: Secondary | ICD-10-CM | POA: Diagnosis not present

## 2017-07-15 DIAGNOSIS — E782 Mixed hyperlipidemia: Secondary | ICD-10-CM | POA: Diagnosis not present

## 2017-07-15 DIAGNOSIS — E1142 Type 2 diabetes mellitus with diabetic polyneuropathy: Secondary | ICD-10-CM | POA: Diagnosis not present

## 2017-07-15 DIAGNOSIS — Z0001 Encounter for general adult medical examination with abnormal findings: Secondary | ICD-10-CM | POA: Diagnosis not present

## 2017-07-15 DIAGNOSIS — E1122 Type 2 diabetes mellitus with diabetic chronic kidney disease: Secondary | ICD-10-CM

## 2017-07-15 DIAGNOSIS — Z860100 Personal history of colon polyps, unspecified: Secondary | ICD-10-CM

## 2017-07-15 DIAGNOSIS — N183 Chronic kidney disease, stage 3 (moderate): Secondary | ICD-10-CM | POA: Diagnosis not present

## 2017-07-15 DIAGNOSIS — Z79899 Other long term (current) drug therapy: Secondary | ICD-10-CM

## 2017-07-15 DIAGNOSIS — R6889 Other general symptoms and signs: Secondary | ICD-10-CM

## 2017-07-15 DIAGNOSIS — Z0000 Encounter for general adult medical examination without abnormal findings: Secondary | ICD-10-CM

## 2017-07-15 NOTE — Patient Instructions (Signed)
We want weight loss that will last so you should lose 1-2 pounds a week.  THAT IS IT! Please pick THREE things a month to change. Once it is a habit check off the item. Then pick another three items off the list to become habits.  If you are already doing a habit on the list GREAT!  Cross that item off! o Don't drink your calories. Ie, alcohol, soda, fruit juice, and sweet tea.  o Drink more water. Drink a glass when you feel hungry or before each meal.  o Eat breakfast - Complex carb and protein (likeDannon light and fit yogurt, oatmeal, fruit, eggs, [place] bacon). o Measure your cereal.  Eat no more than one cup a day. (ie [place]) o Eat an apple a day. o Add a vegetable a day. o Try a new vegetable a month. o Use Pam! Stop using oil or butter to cook. o Don't finish your plate or use smaller plates. o Share your dessert. o Eat sugar free Jello for dessert or frozen grapes. o Don't eat 2-3 hours before bed. o Switch to whole wheat bread, pasta, and brown rice. o Make healthier choices when you eat out. No fries! o Pick baked chicken, NOT fried. o Don't forget to SLOW DOWN when you eat. It is not going anywhere.  o Take the stairs. o Park far away in the parking lot o [employer] (or weights) for 10 minutes while watching TV. o Walk at work for 10 minutes during break. o Walk outside 1 time a week with your friend, kids, dog, or significant other. o Start a walking group at [place] the mall as much as you can tolerate.  o Keep a food diary. o Weigh yourself daily. o Walk for 15 minutes 3 days per week. o Cook at home more often and eat out less.  If life happens and you go back to old habits, it is okay.  Just start over. You can do it!   If you experience chest pain, get short of breath, or tired during the exercise, please stop immediately and inform your doctor.    Recommendations For Diabetic/Prediabetic Patients:   -  Take medications as prescribed  -  Recommend Dr  Fara Olden Fuhrman's book "The End of Diabetes "  And "The End of Dieting"- Can get at  www.[employer].com and encourage also get the Audio CD book  - AVOID Animal products, ie. Meat - red/white, Poultry and Dairy/especially cheese - Exercise at least 5 times a week for 30 minutes or preferably daily.  - No Smoking - Drink less than 2 drinks a day.  - Monitor your feet for sores - Have yearly Eye Exams - Recommend annual Flu vaccine  - Recommend Pneumovax and Prevnar vaccines - Shingles Vaccine (Zostavax) if over 62 y.o.  Goals:   - BMI less than 24 - Fasting sugar less than 130 or less than 150 if tapering medicines to lose weight  - Systolic BP less than 315  - Diastolic BP less than 80 - Bad LDL Cholesterol less than 70 - Triglycerides less than 150

## 2017-07-16 LAB — CBC WITH DIFFERENTIAL/PLATELET
Basophils Absolute: 60 cells/uL (ref 0–200)
Basophils Relative: 0.9 %
Eosinophils Absolute: 248 cells/uL (ref 15–500)
Eosinophils Relative: 3.7 %
HCT: 38.6 % (ref 38.5–50.0)
Hemoglobin: 13.1 g/dL — ABNORMAL LOW (ref 13.2–17.1)
Lymphs Abs: 1280 cells/uL (ref 850–3900)
MCH: 29.7 pg (ref 27.0–33.0)
MCHC: 33.9 g/dL (ref 32.0–36.0)
MCV: 87.5 fL (ref 80.0–100.0)
MPV: 11.3 fL (ref 7.5–12.5)
Monocytes Relative: 10.8 %
Neutro Abs: 4389 cells/uL (ref 1500–7800)
Neutrophils Relative %: 65.5 %
Platelets: 185 10*3/uL (ref 140–400)
RBC: 4.41 10*6/uL (ref 4.20–5.80)
RDW: 13 % (ref 11.0–15.0)
Total Lymphocyte: 19.1 %
WBC mixed population: 724 cells/uL (ref 200–950)
WBC: 6.7 10*3/uL (ref 3.8–10.8)

## 2017-07-16 LAB — BASIC METABOLIC PANEL WITH GFR
BUN/Creatinine Ratio: 14 (calc) (ref 6–22)
BUN: 24 mg/dL (ref 7–25)
CO2: 27 mmol/L (ref 20–32)
Calcium: 9.3 mg/dL (ref 8.6–10.3)
Chloride: 103 mmol/L (ref 98–110)
Creat: 1.69 mg/dL — ABNORMAL HIGH (ref 0.70–1.11)
GFR, Est African American: 43 mL/min/{1.73_m2} — ABNORMAL LOW (ref >=60)
GFR, Est Non African American: 38 mL/min/{1.73_m2} — ABNORMAL LOW (ref >=60)
Glucose, Bld: 202 mg/dL — ABNORMAL HIGH (ref 65–99)
Potassium: 4.8 mmol/L (ref 3.5–5.3)
Sodium: 139 mmol/L (ref 135–146)

## 2017-07-16 LAB — HEPATIC FUNCTION PANEL
AG Ratio: 1.8 (calc) (ref 1.0–2.5)
ALT: 19 U/L (ref 9–46)
AST: 22 U/L (ref 10–35)
Albumin: 4.2 g/dL (ref 3.6–5.1)
Alkaline phosphatase (APISO): 54 U/L (ref 40–115)
Bilirubin, Direct: 0.1 mg/dL (ref 0.0–0.2)
Globulin: 2.3 g/dL (calc) (ref 1.9–3.7)
Indirect Bilirubin: 0.4 mg/dL (calc) (ref 0.2–1.2)
Total Bilirubin: 0.5 mg/dL (ref 0.2–1.2)
Total Protein: 6.5 g/dL (ref 6.1–8.1)

## 2017-07-16 LAB — TSH: TSH: 3.35 mIU/L (ref 0.40–4.50)

## 2017-07-16 LAB — LIPID PANEL
Cholesterol: 142 mg/dL (ref <200)
HDL: 40 mg/dL — ABNORMAL LOW (ref >40)
LDL Cholesterol (Calc): 61 mg/dL (calc)
Non-HDL Cholesterol (Calc): 102 mg/dL (calc) (ref <130)
Total CHOL/HDL Ratio: 3.6 (calc) (ref <5.0)
Triglycerides: 340 mg/dL — ABNORMAL HIGH (ref <150)

## 2017-07-16 LAB — MAGNESIUM: Magnesium: 1.8 mg/dL (ref 1.5–2.5)

## 2017-07-16 LAB — HEMOGLOBIN A1C
Hgb A1c MFr Bld: 8.3 % of total Hgb — ABNORMAL HIGH (ref <5.7)
Mean Plasma Glucose: 192 (calc)
eAG (mmol/L): 10.6 (calc)

## 2017-07-16 LAB — VITAMIN D 25 HYDROXY (VIT D DEFICIENCY, FRACTURES): Vit D, 25-Hydroxy: 63 ng/mL (ref 30–100)

## 2017-07-28 DIAGNOSIS — E1122 Type 2 diabetes mellitus with diabetic chronic kidney disease: Secondary | ICD-10-CM | POA: Diagnosis not present

## 2017-09-02 ENCOUNTER — Other Ambulatory Visit: Payer: Self-pay | Admitting: Internal Medicine

## 2017-09-06 ENCOUNTER — Other Ambulatory Visit: Payer: Self-pay | Admitting: Internal Medicine

## 2017-09-06 DIAGNOSIS — E119 Type 2 diabetes mellitus without complications: Secondary | ICD-10-CM

## 2017-10-02 ENCOUNTER — Other Ambulatory Visit: Payer: Self-pay | Admitting: Internal Medicine

## 2017-10-02 ENCOUNTER — Other Ambulatory Visit: Payer: Self-pay | Admitting: Physician Assistant

## 2017-10-14 NOTE — Progress Notes (Signed)
This very nice 83 y.o. MWM presents for 6 month follow up with HTN, HLD, T2_DM and Vitamin D Deficiency. Patient has GERD controlled with current meds.     Patient is treated for HTN circa 1985 & BP has been controlled at home. Today's BP is at goal - 120/68. Patient has had no complaints of any cardiac type chest pain, palpitations, dyspnea / orthopnea / PND, dizziness, claudication, or dependent edema.     Hyperlipidemia is controlled with diet & meds. Patient denies myalgias or other med SE's. Last Lipids were at goal albeit elevated Trig's: Lab Results  Component Value Date   CHOL 142 07/15/2017   HDL 40 (L) 07/15/2017   LDLCALC 33 03/17/2017   TRIG 340 (H) 07/15/2017   CHOLHDL 3.6 07/15/2017      Also, the patient has Gluttony and  Morbid Obesity (BMI 36+) with consequent  T2_NIDDM since 1999 and CKD3 (GFR 38)  and he denies symptoms of reactive hypoglycemia, diabetic polys, paresthesias or visual blurring.   His last  A1c reflects his compulsive Overeating and poor dietary compliance: Lab Results  Component Value Date   HGBA1C 8.3 (H) 07/15/2017      Further, the patient also has history of Vitamin D Deficiency ("32"/2008) and supplements vitamin D without any suspected side-effects. Last vitamin D was nearer goal: Lab Results  Component Value Date   VD25OH 63 07/15/2017   Current Outpatient Medications on File Prior to Visit  Medication Sig  . aspirin 81 MG tablet Take 1 tablet (81 mg total) by mouth daily. Stop and restart on 09/04/11  . Cinnamon 500 MG TABS Take 1,000 mg by mouth.  . doxazosin (CARDURA) 8 MG tablet TAKE 1 TABLET BY MOUTH ONCE DAILY FOR BLOOD PRESSURE/PROSTATE  . glipiZIDE (GLUCOTROL) 5 MG tablet TAKE 1/2 TO 1 TABLET BY MOUTH THREE TIMES DAILY WITH MEALS FOR DIABETES  . glucose blood (FREESTYLE TEST STRIPS) test strip Check blood sugar 1 time daily-DX-E11.22.  Marland Kitchen glucose monitoring kit (FREESTYLE) monitoring kit check blood sugar 1 time daily-DX-E11.22.    . hydrochlorothiazide (HYDRODIURIL) 25 MG tablet TAKE 1 TABLET BY MOUTH EVERY DAY FOR BLOOD PRESSURE AND FLUID  . Lancets (FREESTYLE) lancets Check blood sugar 1 time daily-DX-E11.22.  . lisinopril (PRINIVIL,ZESTRIL) 40 MG tablet TAKE 1 TABLET BY MOUTH DAILY FOR BLOOD PRESSURE AND DIABETIC KIDNEY PROTECTION  . magnesium oxide (MAG-OX) 400 MG tablet Take 400 mg by mouth daily.  . metFORMIN (GLUCOPHAGE-XR) 500 MG 24 hr tablet TAKE 2 TABLETS BY MOUTH TWICE DAILY  . Multiple Vitamins-Minerals (MULTIVITAMIN WITH MINERALS) tablet Take 1 tablet by mouth daily.    . Nutritional Supplements (VITAMIN D MAINTENANCE PO) Take 10,000 mg by mouth daily.    . pravastatin (PRAVACHOL) 40 MG tablet TAKE 1 TABLET BY MOUTH AT BEDTIME FOR CHOLESTEROL   No current facility-administered medications on file prior to visit.    No Known Allergies   PMHx:   Past Medical History:  Diagnosis Date  . Arthritis   . BPH (benign prostatic hyperplasia)   . Cataract   . CKD (chronic kidney disease), stage III ([place])   . Depression    "usually a happy go lucky"  . Diabetes mellitus   . Diverticulosis   . GERD (gastroesophageal reflux disease)    sometimes will take omeprazole  . Hyperlipidemia   . Hypertension    Immunization History  Administered Date(s) Administered  . DT 08/31/2015  . Influenza Whole 05/24/2011, 08/06/2012, 07/07/2013  .  Influenza, High Dose Seasonal PF 06/21/2014, 08/31/2015, 06/03/2016, 07/15/2017  . Pneumococcal Conjugate-13 06/21/2014  . Pneumococcal Polysaccharide-23 03/10/2005   Past Surgical History:  Procedure Laterality Date  . CHOLECYSTECTOMY N/A 11/29/2015   Procedure: LAPAROSCOPIC CHOLECYSTECTOMY WITH INTRAOPERATIVE CHOLANGIOGRAM;  Surgeon: Erroll Luna, MD;  [facility] OR;  Service: General;  Laterality: N/A;  . COLONOSCOPY  2005, 08/21/11   2005 tubulovillous adenoma and adenomas 2012 - 3 adenomas, largest 10 mm, diverticulosis  . FRACTURE SURGERY     ? right foot as a  child  . KNEE ARTHROSCOPY    . TONSILLECTOMY AND ADENOIDECTOMY    . VEIN LIGATION AND STRIPPING     left   FHx:    Reviewed / unchanged  SHx:    Reviewed / unchanged   Systems Review:  Constitutional: Denies fever, chills, wt changes, headaches, insomnia, fatigue, night sweats, change in appetite. Eyes: Denies redness, blurred vision, diplopia, discharge, itchy, watery eyes.  ENT: Denies discharge, congestion, post nasal drip, epistaxis, sore throat, earache, hearing loss, dental pain, tinnitus, vertigo, sinus pain, snoring.  CV: Denies chest pain, palpitations, irregular heartbeat, syncope, dyspnea, diaphoresis, orthopnea, PND, claudication or edema. Respiratory: denies cough, dyspnea, DOE, pleurisy, hoarseness, laryngitis, wheezing.  Gastrointestinal: Denies dysphagia, odynophagia, heartburn, reflux, water brash, abdominal pain or cramps, nausea, vomiting, bloating, diarrhea, constipation, hematemesis, melena, hematochezia  or hemorrhoids. Genitourinary: Denies dysuria, frequency, urgency, nocturia, hesitancy, discharge, hematuria or flank pain. Musculoskeletal: Denies arthralgias, myalgias, stiffness, jt. swelling, pain, limping or strain/sprain.  Skin: Denies pruritus, rash, hives, warts, acne, eczema or change in skin lesion(s). Neuro: No weakness, tremor, incoordination, spasms, paresthesia or pain. Psychiatric: Denies confusion, memory loss or sensory loss. Endo: Denies change in weight, skin or hair change.  Heme/Lymph: No excessive bleeding, bruising or enlarged lymph nodes.  Physical Exam  BP 120/68   Pulse 88   Temp (!) 97.5 F (36.4 C)   Resp 16   Ht '5\' 9"'$  (1.753 m)   Wt 259 lb (117.5 kg)   BMI 38.25 kg/m   Appears over nourished, slothanly and in no distress.  Eyes: PERRLA, EOMs, conjunctiva no swelling or erythema. Sinuses: No frontal/maxillary tenderness ENT/Mouth: EAC's clear, TM's nl w/o erythema, bulging. Nares clear w/o erythema, swelling, exudates.  Oropharynx clear without erythema or exudates. Oral hygiene is good. Tongue normal, non obstructing. Hearing intact.  Neck: Supple. Thyroid nl. Car 2+/2+ without bruits, nodes or JVD. Chest: Respirations nl with BS clear & equal w/o rales, rhonchi, wheezing or stridor.  Cor: Heart sounds normal w/ regular rate and rhythm without sig. murmurs, gallops, clicks or rubs. Peripheral pulses normal and equal  without edema.  Abdomen: Soft, Rotund & bowel sounds normal. Non-tender w/o guarding, rebound, hernias, masses or organomegaly.  Lymphatics: Unremarkable.  Musculoskeletal: Full ROM all peripheral extremities, joint stability, 5/5 strength and normal gait.  Skin: Warm, dry without exposed rashes, lesions or ecchymosis apparent.  Neuro: Cranial nerves intact, reflexes equal bilaterally. Sensory-motor testing grossly intact. Tendon reflexes grossly intact.  Pysch: Alert & oriented x 3.  Insight and judgement nl & appropriate. No ideations.  Assessment and Plan:  1. Essential hypertension  - Continue medication, monitor blood pressure at home.  - Continue DASH diet. Reminder to go to the ER if any CP,  SOB, nausea, dizziness, severe HA, changes vision/speech.  - CBC with Differential/Platelet - BASIC METABOLIC PANEL WITH GFR - Magnesium - TSH  2. Hyperlipidemia, mixed  - Continue diet/meds, exercise,& lifestyle modifications.  - Continue monitor periodic cholesterol/liver & renal  functions   - Hepatic function panel - Lipid panel - TSH  3. Type 2 diabetes mellitus with stage 3 chronic kidney disease, without long-term current use of insulin (HCC)  - Continue diet, exercise, lifestyle modifications.  - Monitor appropriate labs.  - Hemoglobin A1c - Insulin, random  4. Vitamin D deficiency  - Continue supplementation.  - VITAMIN D 25 Hydroxy   5. Medication management  - CBC with Differential/Platelet - BASIC METABOLIC PANEL WITH GFR - Hepatic function panel -  Magnesium - Lipid panel - TSH - Hemoglobin A1c - Insulin, random - VITAMIN D 25 Hydroxy          Discussed  regular exercise, BP monitoring, weight control to achieve/maintain BMI less than 25 and discussed med and SE's. Recommended labs to assess and monitor clinical status with further disposition pending results of labs. Over 30 minutes of exam, counseling, chart review was performed.

## 2017-10-14 NOTE — Patient Instructions (Signed)

## 2017-10-15 ENCOUNTER — Encounter: Payer: Self-pay | Admitting: Internal Medicine

## 2017-10-15 ENCOUNTER — Ambulatory Visit (INDEPENDENT_AMBULATORY_CARE_PROVIDER_SITE_OTHER): Payer: Medicare Other | Admitting: Internal Medicine

## 2017-10-15 VITALS — BP 120/68 | HR 88 | Temp 97.5°F | Resp 16 | Ht 69.0 in | Wt 259.0 lb

## 2017-10-15 DIAGNOSIS — N183 Chronic kidney disease, stage 3 unspecified: Secondary | ICD-10-CM

## 2017-10-15 DIAGNOSIS — E1122 Type 2 diabetes mellitus with diabetic chronic kidney disease: Secondary | ICD-10-CM

## 2017-10-15 DIAGNOSIS — E559 Vitamin D deficiency, unspecified: Secondary | ICD-10-CM | POA: Diagnosis not present

## 2017-10-15 DIAGNOSIS — E782 Mixed hyperlipidemia: Secondary | ICD-10-CM

## 2017-10-15 DIAGNOSIS — I10 Essential (primary) hypertension: Secondary | ICD-10-CM | POA: Diagnosis not present

## 2017-10-15 DIAGNOSIS — Z79899 Other long term (current) drug therapy: Secondary | ICD-10-CM

## 2017-10-16 LAB — CBC WITH DIFFERENTIAL/PLATELET
Basophils Absolute: 33 cells/uL (ref 0–200)
Basophils Relative: 0.5 %
Eosinophils Absolute: 234 cells/uL (ref 15–500)
Eosinophils Relative: 3.6 %
HCT: 39.9 % (ref 38.5–50.0)
Hemoglobin: 13.3 g/dL (ref 13.2–17.1)
Lymphs Abs: 1248 cells/uL (ref 850–3900)
MCH: 29.4 pg (ref 27.0–33.0)
MCHC: 33.3 g/dL (ref 32.0–36.0)
MCV: 88.1 fL (ref 80.0–100.0)
MPV: 11 fL (ref 7.5–12.5)
Monocytes Relative: 10.4 %
Neutro Abs: 4310 cells/uL (ref 1500–7800)
Neutrophils Relative %: 66.3 %
Platelets: 173 10*3/uL (ref 140–400)
RBC: 4.53 10*6/uL (ref 4.20–5.80)
RDW: 12.9 % (ref 11.0–15.0)
Total Lymphocyte: 19.2 %
WBC mixed population: 676 cells/uL (ref 200–950)
WBC: 6.5 10*3/uL (ref 3.8–10.8)

## 2017-10-16 LAB — HEPATIC FUNCTION PANEL
AG Ratio: 1.7 (calc) (ref 1.0–2.5)
ALT: 21 U/L (ref 9–46)
AST: 23 U/L (ref 10–35)
Albumin: 4.1 g/dL (ref 3.6–5.1)
Alkaline phosphatase (APISO): 51 U/L (ref 40–115)
Bilirubin, Direct: 0.1 mg/dL (ref 0.0–0.2)
Globulin: 2.4 g/dL (calc) (ref 1.9–3.7)
Indirect Bilirubin: 0.4 mg/dL (calc) (ref 0.2–1.2)
Total Bilirubin: 0.5 mg/dL (ref 0.2–1.2)
Total Protein: 6.5 g/dL (ref 6.1–8.1)

## 2017-10-16 LAB — BASIC METABOLIC PANEL WITH GFR
BUN/Creatinine Ratio: 15 (calc) (ref 6–22)
BUN: 30 mg/dL — ABNORMAL HIGH (ref 7–25)
CO2: 23 mmol/L (ref 20–32)
Calcium: 9.2 mg/dL (ref 8.6–10.3)
Chloride: 102 mmol/L (ref 98–110)
Creat: 1.98 mg/dL — ABNORMAL HIGH (ref 0.70–1.11)
GFR, Est African American: 36 mL/min/{1.73_m2} — ABNORMAL LOW (ref >=60)
GFR, Est Non African American: 31 mL/min/{1.73_m2} — ABNORMAL LOW (ref >=60)
Glucose, Bld: 240 mg/dL — ABNORMAL HIGH (ref 65–99)
Potassium: 4.6 mmol/L (ref 3.5–5.3)
Sodium: 138 mmol/L (ref 135–146)

## 2017-10-16 LAB — LIPID PANEL
Cholesterol: 146 mg/dL (ref <200)
HDL: 38 mg/dL — ABNORMAL LOW (ref >40)
Non-HDL Cholesterol (Calc): 108 mg/dL (calc) (ref <130)
Total CHOL/HDL Ratio: 3.8 (calc) (ref <5.0)
Triglycerides: 428 mg/dL — ABNORMAL HIGH (ref <150)

## 2017-10-16 LAB — MAGNESIUM: Magnesium: 1.7 mg/dL (ref 1.5–2.5)

## 2017-10-16 LAB — VITAMIN D 25 HYDROXY (VIT D DEFICIENCY, FRACTURES): Vit D, 25-Hydroxy: 58 ng/mL (ref 30–100)

## 2017-10-16 LAB — HEMOGLOBIN A1C
Hgb A1c MFr Bld: 8.8 % of total Hgb — ABNORMAL HIGH (ref <5.7)
Mean Plasma Glucose: 206 (calc)
eAG (mmol/L): 11.4 (calc)

## 2017-10-16 LAB — INSULIN, RANDOM: Insulin: 19.1 u[IU]/mL (ref 2.0–19.6)

## 2017-10-16 LAB — TSH: TSH: 2.91 mIU/L (ref 0.40–4.50)

## 2017-11-06 DIAGNOSIS — E1122 Type 2 diabetes mellitus with diabetic chronic kidney disease: Secondary | ICD-10-CM | POA: Diagnosis not present

## 2017-11-09 ENCOUNTER — Ambulatory Visit: Payer: Medicare Other | Admitting: Internal Medicine

## 2017-11-09 ENCOUNTER — Encounter: Payer: Self-pay | Admitting: Internal Medicine

## 2017-11-09 VITALS — BP 126/80 | HR 96 | Temp 97.3°F | Resp 18 | Ht 69.0 in | Wt 242.0 lb

## 2017-11-09 DIAGNOSIS — J042 Acute laryngotracheitis: Secondary | ICD-10-CM

## 2017-11-09 DIAGNOSIS — J0140 Acute pansinusitis, unspecified: Secondary | ICD-10-CM

## 2017-11-09 MED ORDER — AZITHROMYCIN 250 MG PO TABS: ORAL_TABLET | ORAL | 1 refills | Status: DC

## 2017-11-09 MED ORDER — PROMETHAZINE-DM 6.25-15 MG/5ML PO SYRP: ORAL_SOLUTION | ORAL | 1 refills | Status: DC

## 2017-11-09 MED ORDER — PREDNISONE 10 MG PO TABS: ORAL_TABLET | ORAL | 0 refills | Status: DC

## 2017-11-09 MED ORDER — BENZONATATE 200 MG PO CAPS: ORAL_CAPSULE | ORAL | 1 refills | Status: DC

## 2017-11-09 NOTE — Progress Notes (Signed)
Subjective:    Patient ID: Edwin Osborne, male    DOB: 1934/12/03, 83 y.o.   MRN: [id number]  HPI  This 83 yo MWM had recent Epiphany for persistently elevated A1c's - last at 8.8% and GFR had dropped to 31 and he was apprised of his consequental over-eating approaching Dialysis and apparently with the fear of the Reita Cliche he has lost 17# in the last 3 weeks by eliminating snacks and cutting portion sizes.   Now he presents with a 1 week  prodrome of increasing head & chest congestion and productive cough. Denies fevers, chills, sweats, dyspnea or rash.   Medication Sig  . aspirin 81 MG  Take 1 tablet  daily.  . Cinnamon 500 MG  Take 1,000 mg by mouth.  . Doxazosin 8 MG TAKE 1 TAB DAILY  . glipiZIDE   5 MG TAKE 1/2 TO 1 TAB THREE TIMES DAILY   . hctz  25 MG  TAKE 1 TAB EVERY DAY   . lisinopril 40 MG TAKE 1 TAB DAILY  . Magnesium 400 MG  Take 400 mg by mouth daily.  . metFORMIN -XR 500 MG  TAKE 2 TAB TWICE DAILY  . Multi-Vit-Minerals  Take 1 tab daily.    Marland Kitchen VITAMIN D  Take 10,000 mg by mouth daily.    . pravastatin  40 MG tablet TAKE 1 TAB AT BEDTIME   No Known Allergies   Past Medical History:  Diagnosis Date  . Arthritis   . BPH (benign prostatic hyperplasia)   . Cataract   . CKD (chronic kidney disease), stage III ([place])   . Depression    "usually a happy go lucky"  . Diabetes mellitus   . Diverticulosis   . GERD (gastroesophageal reflux disease)    sometimes will take omeprazole  . Hyperlipidemia   . Hypertension    Past Surgical History:  Procedure Laterality Date  . CHOLECYSTECTOMY N/A 11/29/2015   Procedure: LAPAROSCOPIC CHOLECYSTECTOMY WITH INTRAOPERATIVE CHOLANGIOGRAM;  Surgeon: Erroll Luna, MD;  Location: [facility] OR;  Service: General;  Laterality: N/A;  . COLONOSCOPY  2005, 08/21/11   2005 tubulovillous adenoma and adenomas 2012 - 3 adenomas, largest 10 mm, diverticulosis  . FRACTURE SURGERY     ? right foot as a child  . KNEE ARTHROSCOPY    . TONSILLECTOMY AND  ADENOIDECTOMY    . VEIN LIGATION AND STRIPPING     left   Review of Systems   10 point systems review negative except as above.    Objective:   Physical Exam  BP 126/80   Pulse 96   Temp (!) 97.3 F (36.3 C)   Resp 18   Ht 5\' 9"  (1.753 m)   Wt 242 lb (109.8 kg)   BMI 35.74 kg/m   No distress or stridor. No exposed rash, cyanosis or icterus. Sl hoarse with dry cough.  HEENT - (+) Rt frontal tenderness. N/O/P clear. TM's Nl.  Neck - supple.  Chest - Scattered rales & dry rhonchi . No Wheezes.  Cor - Nl HS. RRR w/o sig MGR. PP 1(+). No edema. MS- FROM w/o deformities.  Gait Nl. Neuro -  Nl w/o focal abnormalities.    Assessment & Plan:   1. Laryngotracheitis   2. Acute non-recurrent pansinusitis  - predniSONE (DELTASONE) 10 MG tablet; 1 tab 3 x day for 2 days, then 1 tab 2 x day for 2 days, then 1 tab 1 x day for 3 days  Dispense: 13 tablet; Refill: 0  -  azithromycin (ZITHROMAX) 250 MG tablet; Take 2 tablets (500 mg) on  Day 1,  followed by 1 tablet (250 mg) once daily on Days 2 through 5.  Dispense: 6 each; Refill: 1  - benzonatate (TESSALON) 200 MG capsule; Take 1 perle 3 x / day to prevent cough  Dispense: 30 capsule; Refill: 1  - promethazine-dextromethorphan (PROMETHAZINE-DM) 6.25-15 MG/5ML syrup; Take 1 to 2 tsp enery 4 hours if needed for cough  Dispense: 360 mL; Refill: 1  - discussed med& side-effects

## 2017-11-15 ENCOUNTER — Other Ambulatory Visit: Payer: Self-pay | Admitting: Internal Medicine

## 2017-11-28 ENCOUNTER — Other Ambulatory Visit: Payer: Self-pay | Admitting: Internal Medicine

## 2017-12-31 ENCOUNTER — Other Ambulatory Visit: Payer: Self-pay | Admitting: Internal Medicine

## 2018-01-17 NOTE — Progress Notes (Signed)
FOLLOW UP  Assessment and Plan:   Hypertension Well controlled with current medications  Monitor blood pressure at home; patient to call if consistently greater than 130/80 Continue DASH diet.   Reminder to go to the ER if any CP, SOB, nausea, dizziness, severe HA, changes vision/speech, left arm numbness and tingling and jaw pain.  Cholesterol Currently above goal; continue pravastatin, recommended omega 3 supplement, emphasized adherence to diet Continue low cholesterol diet and exercise.  Check lipid panel.   Diabetes with diabetic chronic kidney disease Continue medication: metformin, glipizide, cinnamon supplement - currently reports only taking metformin 500 mg once daily and glipizide 5 mg once daily due to "feeling bad" taking higher doses - discussed A1C goal of <7, commended weight loss, but recommended prefer to have take higher doses of metformin and stop glipizide completely if needed due to hypoglycemia. Patient agreeable to this pending A1C results.  Continue diet and exercise.  Perform daily foot/skin check, notify office of any concerning changes.  Check A1C  Obesity with co morbidities Long discussion about weight loss, diet, and exercise Recommended diet heavy in fruits and veggies and low in animal meats, cheeses, and dairy products, appropriate calorie intake Discussed ideal weight for height  Continue with current portion control plan Will follow up in 3 months  Vitamin D Def Near goal at last visit; continue supplementation to maintain goal of 60-100 Defer Vit D level  Continue diet and meds as discussed. Further disposition pending results of labs. Discussed med's effects and SE's.   Over 30 minutes of exam, counseling, chart review, and critical decision making was performed.   Future Appointments  Date Time Provider Kuna  04/20/2018 10:00 AM Unk Pinto, MD [facility] None     ----------------------------------------------------------------------------------------------------------------------  HPI 83 y.o. male  presents for 3 month follow up on hypertension, cholesterol, diabetes, obesity and vitamin D deficiency.   BMI is Body mass index is 34.11 kg/m., he has been working on diet and exercise. Wt Readings from Last 3 Encounters:  01/18/18 231 lb (104.8 kg)  11/09/17 242 lb (109.8 kg)  10/15/17 259 lb (117.5 kg)   His blood pressure has been controlled at home, today their BP is BP: 110/60  He does workout. He denies chest pain, shortness of breath, dizziness.   He is on cholesterol medication (pravastatin 20 mg daily) and denies myalgias. His cholesterol is not at goal. The cholesterol last visit was:   Lab Results  Component Value Date   CHOL 146 10/15/2017   HDL 38 (L) 10/15/2017   LDLCALC  10/15/2017     Comment:     . LDL cholesterol not calculated. Triglyceride levels greater than 400 mg/dL invalidate calculated LDL results. . Reference range: <100 . Desirable range <100 mg/dL for primary prevention;   <70 mg/dL for patients with CHD or diabetic patients  with > or = 2 CHD risk factors. Marland Kitchen LDL-C is now calculated using the Martin-Hopkins  calculation, which is a validated novel method providing  better accuracy than the Friedewald equation in the  estimation of LDL-C.  Cresenciano Genre et al. Annamaria Helling. 0347;425(95): 2061-2068  (http://education.[employer].com/faq/FAQ164)    TRIG 428 (H) 10/15/2017   CHOLHDL 3.8 10/15/2017    He has been working on diet and exercise for T2 diabetes, and denies foot ulcerations, hyperglycemia, hypoglycemia , increased appetite, nausea, paresthesia of the feet, polydipsia, polyuria, visual disturbances, vomiting and weight loss. He does check fasting sugars which typically run 100-130. Last A1C in the office was:  Lab Results  Component Value Date   HGBA1C 8.8 (H) 10/15/2017   Patient is on Vitamin D  supplement and was approaching goal of 60 at recent check:    Lab Results  Component Value Date   VD25OH 58 10/15/2017       Current Medications:  Current Outpatient Medications on File Prior to Visit  Medication Sig  . aspirin 81 MG tablet Take 1 tablet (81 mg total) by mouth daily. Stop and restart on 09/04/11  . Cinnamon 500 MG TABS Take 1,000 mg by mouth.  . doxazosin (CARDURA) 8 MG tablet TAKE 1 TABLET BY MOUTH ONCE DAILY FOR BLOOD PRESSURE/PROSTATE (Patient taking differently: TAKE 1/2 TABLET BY MOUTH ONCE DAILY FOR BLOOD PRESSURE/PROSTATE)  . glipiZIDE (GLUCOTROL) 5 MG tablet TAKE 1/2 TO 1 TABLET BY MOUTH THREE TIMES DAILY WITH MEALS FOR DIABETES  . glucose blood (FREESTYLE TEST STRIPS) test strip Check blood sugar 1 time daily-DX-E11.22.  Marland Kitchen glucose monitoring kit (FREESTYLE) monitoring kit check blood sugar 1 time daily-DX-E11.22.  . hydrochlorothiazide (HYDRODIURIL) 25 MG tablet TAKE 1 TABLET BY MOUTH EVERY DAY FOR BLOOD PRESSURE AND FLUID  . Lancets (FREESTYLE) lancets Check blood sugar 1 time daily-DX-E11.22.  . lisinopril (PRINIVIL,ZESTRIL) 40 MG tablet TAKE 1 TABLET BY MOUTH DAILY FOR BLOOD PRESSURE AND DIABETIC KIDNEY PROTECTION  . magnesium oxide (MAG-OX) 400 MG tablet Take 400 mg by mouth daily.  . metFORMIN (GLUCOPHAGE-XR) 500 MG 24 hr tablet TAKE 2 TABLETS BY MOUTH TWICE DAILY (Patient taking differently: Take 1 tablet by mouth daily)  . Multiple Vitamins-Minerals (MULTIVITAMIN WITH MINERALS) tablet Take 1 tablet by mouth daily.    . Nutritional Supplements (VITAMIN D MAINTENANCE PO) Take 10,000 mg by mouth daily.    . pravastatin (PRAVACHOL) 40 MG tablet TAKE 1 TABLET BY MOUTH AT BEDTIME FOR CHOLESTEROL  . azithromycin (ZITHROMAX) 250 MG tablet Take 2 tablets (500 mg) on  Day 1,  followed by 1 tablet (250 mg) once daily on Days 2 through 5.  . benzonatate (TESSALON) 200 MG capsule Take 1 perle 3 x / day to prevent cough  . predniSONE (DELTASONE) 10 MG tablet 1 tab 3 x day  for 2 days, then 1 tab 2 x day for 2 days, then 1 tab 1 x day for 3 days  . promethazine-dextromethorphan (PROMETHAZINE-DM) 6.25-15 MG/5ML syrup Take 1 to 2 tsp enery 4 hours if needed for cough   No current facility-administered medications on file prior to visit.      Allergies: No Known Allergies   Medical History:  Past Medical History:  Diagnosis Date  . Arthritis   . BPH (benign prostatic hyperplasia)   . Cataract   . CKD (chronic kidney disease), stage III ([place])   . Depression    "usually a happy go lucky"  . Diabetes mellitus   . Diverticulosis   . GERD (gastroesophageal reflux disease)    sometimes will take omeprazole  . Hyperlipidemia   . Hypertension    Family history- Reviewed and unchanged Social history- Reviewed and unchanged   Review of Systems:  Review of Systems  Constitutional: Negative for malaise/fatigue and weight loss.  HENT: Negative for hearing loss and tinnitus.   Eyes: Negative for blurred vision and double vision.  Respiratory: Negative for cough, shortness of breath and wheezing.   Cardiovascular: Negative for chest pain, palpitations, orthopnea, claudication and leg swelling.  Gastrointestinal: Negative for abdominal pain, blood in stool, constipation, diarrhea, heartburn, melena, nausea and vomiting.  Genitourinary: Negative.  Musculoskeletal: Negative for joint pain and myalgias.  Skin: Negative for rash.  Neurological: Negative for dizziness, tingling, sensory change, weakness and headaches.  Endo/Heme/Allergies: Negative for polydipsia.  Psychiatric/Behavioral: Negative.   All other systems reviewed and are negative.     Physical Exam: BP 110/60   Pulse 80   Temp (!) 97.5 F (36.4 C)   Ht _0  (1.753 m)   Wt 231 lb (104.8 kg)   SpO2 97%   BMI 34.11 kg/m  Wt Readings from Last 3 Encounters:  01/18/18 231 lb (104.8 kg)  11/09/17 242 lb (109.8 kg)  10/15/17 259 lb (117.5 kg)   General Appearance: Well nourished, in no  apparent distress. Eyes: PERRLA, EOMs, conjunctiva no swelling or erythema Sinuses: No Frontal/maxillary tenderness ENT/Mouth: Ext aud canals clear, TMs without erythema, bulging. No erythema, swelling, or exudate on post pharynx.  Tonsils not swollen or erythematous. Hearing normal.  Neck: Supple, thyroid normal.  Respiratory: Respiratory effort normal, BS equal bilaterally without rales, rhonchi, wheezing or stridor.  Cardio: RRR with no MRGs. Brisk peripheral pulses without edema.  Abdomen: Soft, + BS.  Non tender, no guarding, rebound, hernias, masses. Lymphatics: Non tender without lymphadenopathy.  Musculoskeletal: Full ROM, 5/5 strength, Normal gait Skin: Warm, dry without rashes, lesions, ecchymosis.  Neuro: Cranial nerves intact. No cerebellar symptoms.  Psych: Awake and oriented X 3, normal affect, Insight and Judgment appropriate.    Izora Ribas, NP 9:49 AM [facility]

## 2018-01-18 ENCOUNTER — Ambulatory Visit: Payer: Medicare Other | Admitting: Adult Health

## 2018-01-18 ENCOUNTER — Encounter: Payer: Self-pay | Admitting: Adult Health

## 2018-01-18 VITALS — BP 110/60 | HR 80 | Temp 97.5°F | Ht 69.0 in | Wt 231.0 lb

## 2018-01-18 DIAGNOSIS — Z79899 Other long term (current) drug therapy: Secondary | ICD-10-CM

## 2018-01-18 DIAGNOSIS — E559 Vitamin D deficiency, unspecified: Secondary | ICD-10-CM | POA: Diagnosis not present

## 2018-01-18 DIAGNOSIS — N183 Chronic kidney disease, stage 3 unspecified: Secondary | ICD-10-CM

## 2018-01-18 DIAGNOSIS — E118 Type 2 diabetes mellitus with unspecified complications: Secondary | ICD-10-CM

## 2018-01-18 DIAGNOSIS — E782 Mixed hyperlipidemia: Secondary | ICD-10-CM

## 2018-01-18 DIAGNOSIS — I10 Essential (primary) hypertension: Secondary | ICD-10-CM | POA: Diagnosis not present

## 2018-01-18 DIAGNOSIS — E1122 Type 2 diabetes mellitus with diabetic chronic kidney disease: Secondary | ICD-10-CM | POA: Diagnosis not present

## 2018-01-18 LAB — CBC WITH DIFFERENTIAL/PLATELET
Basophils Absolute: 49 cells/uL (ref 0–200)
Basophils Relative: 0.7 %
Eosinophils Absolute: 329 cells/uL (ref 15–500)
Eosinophils Relative: 4.7 %
HCT: 38.8 % (ref 38.5–50.0)
Hemoglobin: 13.1 g/dL — ABNORMAL LOW (ref 13.2–17.1)
Lymphs Abs: 1526 cells/uL (ref 850–3900)
MCH: 29.3 pg (ref 27.0–33.0)
MCHC: 33.8 g/dL (ref 32.0–36.0)
MCV: 86.8 fL (ref 80.0–100.0)
MPV: 10.9 fL (ref 7.5–12.5)
Monocytes Relative: 10.4 %
Neutro Abs: 4368 cells/uL (ref 1500–7800)
Neutrophils Relative %: 62.4 %
Platelets: 210 10*3/uL (ref 140–400)
RBC: 4.47 10*6/uL (ref 4.20–5.80)
RDW: 12.8 % (ref 11.0–15.0)
Total Lymphocyte: 21.8 %
WBC mixed population: 728 cells/uL (ref 200–950)
WBC: 7 10*3/uL (ref 3.8–10.8)

## 2018-01-18 NOTE — Patient Instructions (Signed)
Aim for 7+ servings of fruits and vegetables daily  80+ fluid ounces of water or unsweet tea for healthy kidneys  Limit alcohol to no more than 1-2 drinks per day  Limit animal fats in diet for cholesterol and heart health - choose grass fed whenever available  Aim for low stress - take time to unwind and care for your mental health  Aim for 150 min of moderate intensity exercise weekly for heart health, and weights twice weekly for bone health  Aim for 7-9 hours of sleep daily     When it comes to diets, agreement about the perfect plan isn't easy to find, even among the experts. Experts at the [employer] developed an idea known as the Healthy Eating Plate. Just imagine a plate divided into logical, healthy portions.  The emphasis is on diet quality:  Load up on vegetables and fruits - one-half of your plate: Aim for color and variety, and remember that potatoes don't count.  Go for whole grains - one-quarter of your plate: Whole wheat, barley, wheat berries, quinoa, oats, brown rice, and foods made with them. If you want pasta, go with whole wheat pasta.  Protein power - one-quarter of your plate: Fish, chicken, beans, and nuts are all healthy, versatile protein sources. Limit red meat.  The diet, however, does go beyond the plate, offering a few other suggestions.  Use healthy plant oils, such as olive, canola, soy, corn, sunflower and peanut. Check the labels, and avoid partially hydrogenated oil, which have unhealthy trans fats.  If you're thirsty, drink water. Coffee and tea are good in moderation, but skip sugary drinks and limit milk and dairy products to one or two daily servings.  The type of carbohydrate in the diet is more important than the amount. Some sources of carbohydrates, such as vegetables, fruits, whole grains, and beans-are healthier than others.  Finally, stay active.

## 2018-01-19 LAB — COMPLETE METABOLIC PANEL WITH GFR
AG Ratio: 1.8 (calc) (ref 1.0–2.5)
ALT: 17 U/L (ref 9–46)
AST: 19 U/L (ref 10–35)
Albumin: 4.2 g/dL (ref 3.6–5.1)
Alkaline phosphatase (APISO): 52 U/L (ref 40–115)
BUN/Creatinine Ratio: 15 (calc) (ref 6–22)
BUN: 28 mg/dL — ABNORMAL HIGH (ref 7–25)
CO2: 29 mmol/L (ref 20–32)
Calcium: 9.5 mg/dL (ref 8.6–10.3)
Chloride: 101 mmol/L (ref 98–110)
Creat: 1.81 mg/dL — ABNORMAL HIGH (ref 0.70–1.11)
GFR, Est African American: 40 mL/min/{1.73_m2} — ABNORMAL LOW (ref >=60)
GFR, Est Non African American: 35 mL/min/{1.73_m2} — ABNORMAL LOW (ref >=60)
Globulin: 2.4 g/dL (calc) (ref 1.9–3.7)
Glucose, Bld: 170 mg/dL — ABNORMAL HIGH (ref 65–99)
Potassium: 5 mmol/L (ref 3.5–5.3)
Sodium: 137 mmol/L (ref 135–146)
Total Bilirubin: 0.6 mg/dL (ref 0.2–1.2)
Total Protein: 6.6 g/dL (ref 6.1–8.1)

## 2018-01-19 LAB — HEMOGLOBIN A1C
Hgb A1c MFr Bld: 6.5 % of total Hgb — ABNORMAL HIGH (ref <5.7)
Mean Plasma Glucose: 140 (calc)
eAG (mmol/L): 7.7 (calc)

## 2018-01-19 LAB — TSH: TSH: 2.38 mIU/L (ref 0.40–4.50)

## 2018-01-19 LAB — LIPID PANEL
Cholesterol: 150 mg/dL (ref <200)
HDL: 36 mg/dL — ABNORMAL LOW (ref >40)
LDL Cholesterol (Calc): 87 mg/dL (calc)
Non-HDL Cholesterol (Calc): 114 mg/dL (calc) (ref <130)
Total CHOL/HDL Ratio: 4.2 (calc) (ref <5.0)
Triglycerides: 162 mg/dL — ABNORMAL HIGH (ref <150)

## 2018-01-19 LAB — MAGNESIUM: Magnesium: 1.9 mg/dL (ref 1.5–2.5)

## 2018-02-26 ENCOUNTER — Other Ambulatory Visit: Payer: Self-pay | Admitting: Adult Health

## 2018-03-03 DIAGNOSIS — E1122 Type 2 diabetes mellitus with diabetic chronic kidney disease: Secondary | ICD-10-CM | POA: Diagnosis not present

## 2018-03-12 ENCOUNTER — Other Ambulatory Visit: Payer: Self-pay | Admitting: Internal Medicine

## 2018-04-20 ENCOUNTER — Ambulatory Visit: Payer: Medicare Other | Admitting: Internal Medicine

## 2018-04-20 ENCOUNTER — Encounter: Payer: Self-pay | Admitting: Internal Medicine

## 2018-04-20 VITALS — BP 110/60 | HR 64 | Temp 97.3°F | Resp 18 | Ht 69.0 in | Wt 224.0 lb

## 2018-04-20 DIAGNOSIS — E782 Mixed hyperlipidemia: Secondary | ICD-10-CM | POA: Diagnosis not present

## 2018-04-20 DIAGNOSIS — Z1212 Encounter for screening for malignant neoplasm of rectum: Secondary | ICD-10-CM

## 2018-04-20 DIAGNOSIS — Z1211 Encounter for screening for malignant neoplasm of colon: Secondary | ICD-10-CM

## 2018-04-20 DIAGNOSIS — N183 Chronic kidney disease, stage 3 (moderate): Secondary | ICD-10-CM

## 2018-04-20 DIAGNOSIS — F17200 Nicotine dependence, unspecified, uncomplicated: Secondary | ICD-10-CM

## 2018-04-20 DIAGNOSIS — N138 Other obstructive and reflux uropathy: Secondary | ICD-10-CM

## 2018-04-20 DIAGNOSIS — E1122 Type 2 diabetes mellitus with diabetic chronic kidney disease: Secondary | ICD-10-CM

## 2018-04-20 DIAGNOSIS — N401 Enlarged prostate with lower urinary tract symptoms: Secondary | ICD-10-CM

## 2018-04-20 DIAGNOSIS — Z136 Encounter for screening for cardiovascular disorders: Secondary | ICD-10-CM

## 2018-04-20 DIAGNOSIS — Z8249 Family history of ischemic heart disease and other diseases of the circulatory system: Secondary | ICD-10-CM

## 2018-04-20 DIAGNOSIS — E1142 Type 2 diabetes mellitus with diabetic polyneuropathy: Secondary | ICD-10-CM

## 2018-04-20 DIAGNOSIS — I10 Essential (primary) hypertension: Secondary | ICD-10-CM

## 2018-04-20 DIAGNOSIS — Z0001 Encounter for general adult medical examination with abnormal findings: Secondary | ICD-10-CM

## 2018-04-20 DIAGNOSIS — Z0000 Encounter for general adult medical examination without abnormal findings: Secondary | ICD-10-CM

## 2018-04-20 DIAGNOSIS — R632 Polyphagia: Secondary | ICD-10-CM

## 2018-04-20 DIAGNOSIS — Z79899 Other long term (current) drug therapy: Secondary | ICD-10-CM

## 2018-04-20 DIAGNOSIS — Z125 Encounter for screening for malignant neoplasm of prostate: Secondary | ICD-10-CM

## 2018-04-20 DIAGNOSIS — E559 Vitamin D deficiency, unspecified: Secondary | ICD-10-CM

## 2018-04-20 MED ORDER — NEOMYCIN-POLYMYXIN-DEXAMETH 3.5-10000-0.1 OP SUSP: OPHTHALMIC | 1 refills | Status: DC

## 2018-04-20 NOTE — Progress Notes (Signed)
[facility]   Unk Pinto, M.D.     Uvaldo Bristle. Silverio Lay, P.A.-C Liane Comber, [place]                [place], [place] 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 MWSM presents for a Screening /Preventative Visit & comprehensive evaluation and management of multiple medical co-morbidities.  Patient has been followed for HTN, HLD, T2_NIDDM  and Vitamin D Deficiency. Patient has GERD controlled on his meds.      HTN predates since 60. Patient's BP has been controlled at home.  Today's BP is at goal - 110/60. Patient denies any cardiac symptoms as chest pain, palpitations, shortness of breath, dizziness or ankle swelling.     Patient's hyperlipidemia is controlled with diet and medications. Patient denies myalgias or other medication SE's. Last lipids were at goal albeit elevated Trig's: Lab Results  Component Value Date   CHOL 146 04/20/2018   HDL 42 04/20/2018   LDLCALC 76 04/20/2018   TRIG 180 (H) 04/20/2018   CHOLHDL 3.5 04/20/2018      Patient has hx/o Gluttony and Morbid Obesity (BMI 35) and consequent T2_NIDDM  W/CKD3 (GFR 35)  and patient denies reactive hypoglycemic symptoms, visual blurring, diabetic polys or paresthesias. Patient has finally improved his diet and lost #26 - from 250# last June 2018 to current 224#.  Current A1c is still not at goal: A1c Lab Results  Component Value Date   HGBA1C 7.3 (H) 04/20/2018       Finally, patient has history of Vitamin D Deficiency ("32" in 2008) and current  vitamin D is at goal:  Lab Results  Component Value Date   VD25OH 79 04/20/2018   Current Outpatient Medications on File Prior to Visit  Medication Sig  . aspirin 81 MG tablet Take 1 tablet (81 mg total) by mouth daily. Stop and restart on 09/04/11  .  Cinnamon 500 MG TABS Take 1,000 mg by mouth.  . doxazosin (CARDURA) 8 MG tablet TAKE 1 TABLET BY MOUTH ONCE DAILY FOR BLOOD PRESSURE/PROSTATE (Patient taking differently: TAKE 1/2 TABLET BY MOUTH ONCE DAILY FOR BLOOD PRESSURE/PROSTATE)  . glucose blood (FREESTYLE TEST STRIPS) test strip Check blood sugar 1 time daily-DX-E11.22.  Marland Kitchen glucose monitoring kit (FREESTYLE) monitoring kit check blood sugar 1 time daily-DX-E11.22.  . hydrochlorothiazide (HYDRODIURIL) 25 MG tablet TAKE 1 TABLET BY MOUTH EVERY DAY FOR BLOOD PRESSURE AND FLUID  . Lancets (FREESTYLE) lancets Check blood sugar 1 time daily-DX-E11.22.  . lisinopril (PRINIVIL,ZESTRIL) 40 MG tablet TAKE 1 TABLET BY MOUTH EVERY DAY FOR BLOOD PRESSURE AND DIABETIC KIDNEY PROTECTION  . magnesium oxide (MAG-OX) 400 MG tablet Take 400 mg by mouth daily.  . metFORMIN (GLUCOPHAGE-XR) 500 MG 24 hr tablet TAKE 2 TABLETS BY MOUTH TWICE DAILY  . Multiple Vitamins-Minerals (MULTIVITAMIN WITH MINERALS) tablet Take 1 tablet by mouth daily.    . Nutritional Supplements (VITAMIN D MAINTENANCE PO) Take 10,000 mg by mouth daily.    . pravastatin (PRAVACHOL) 40 MG tablet TAKE 1 TABLET BY MOUTH AT BEDTIME FOR CHOLESTEROL  . glipiZIDE (GLUCOTROL) 5 MG tablet TAKE 1/2 TO 1 TABLET BY MOUTH THREE TIMES DAILY WITH MEALS FOR DIABETES (Patient not taking: Reported on 04/20/2018)   No current facility-administered medications on  file prior to visit.    No Known Allergies   Past Medical History:  Diagnosis Date  . Arthritis   . BPH (benign prostatic hyperplasia)   . Cataract   . CKD (chronic kidney disease), stage III ([place])   . Depression    "usually a happy go lucky"  . Diabetes mellitus   . Diverticulosis   . GERD (gastroesophageal reflux disease)    sometimes will take omeprazole  . Hyperlipidemia   . Hypertension    Health Maintenance  Topic Date Due  . OPHTHALMOLOGY EXAM  09/09/2015  . INFLUENZA VACCINE  04/22/2018  . HEMOGLOBIN A1C  10/21/2018  . FOOT  EXAM  04/21/2019  . COLONOSCOPY  12/18/2019  . TETANUS/TDAP  08/30/2025  . PNA vac Low Risk Adult  Completed   Immunization History  Administered Date(s) Administered  . DT 08/31/2015  . Influenza Whole 05/24/2011, 08/06/2012, 07/07/2013  . Influenza, High Dose Seasonal PF 06/21/2014, 08/31/2015, 06/03/2016, 07/15/2017  . Pneumococcal Conjugate-13 06/21/2014  . Pneumococcal Polysaccharide-23 03/10/2005   Last Colon - 12/17/2016 - Dr Carlean Purl - recc 3 yr f/u due Mar 2021.  Past Surgical History:  Procedure Laterality Date  . CHOLECYSTECTOMY N/A 11/29/2015   Procedure: LAPAROSCOPIC CHOLECYSTECTOMY WITH INTRAOPERATIVE CHOLANGIOGRAM;  Surgeon: Erroll Luna, MD;  Location: [facility] OR;  Service: General;  Laterality: N/A;  . COLONOSCOPY  2005, 08/21/11   2005 tubulovillous adenoma and adenomas 2012 - 3 adenomas, largest 10 mm, diverticulosis  . FRACTURE SURGERY     ? right foot as a child  . KNEE ARTHROSCOPY    . TONSILLECTOMY AND ADENOIDECTOMY    . VEIN LIGATION AND STRIPPING     left   Family History  Problem Relation Age of Onset  . Prostate cancer Brother   . Diabetes Brother   . Colon cancer Neg Hx    Social History   Socioeconomic History  . Marital status: Married    Spouse name: Not on file  . Number of children: 3  . Years of education: Not on file  . Highest education level: Not on file  Occupational History  . Occupation: retired  Tobacco Use  . Smoking status: Current Some Day Smoker    Types: Cigars    Last attempt to quit: 09/23/1987    Years since quitting: 30.6  . Smokeless tobacco: Never Used  . Tobacco comment: once monthly  Substance and Sexual Activity  . Alcohol use: Yes    Comment: once monthly  . Drug use: No  . Sexual activity: No    ROS Constitutional: Denies fever, chills, weight loss/gain, headaches, insomnia,  night sweats or change in appetite. Does c/o fatigue. Eyes: Denies redness, blurred vision, diplopia, discharge, itchy or watery eyes.   ENT: Denies discharge, congestion, post nasal drip, epistaxis, sore throat, earache, hearing loss, dental pain, Tinnitus, Vertigo, Sinus pain or snoring.  Cardio: Denies chest pain, palpitations, irregular heartbeat, syncope, dyspnea, diaphoresis, orthopnea, PND, claudication or edema Respiratory: denies cough, dyspnea, DOE, pleurisy, hoarseness, laryngitis or wheezing.  Gastrointestinal: Denies dysphagia, heartburn, reflux, water brash, pain, cramps, nausea, vomiting, bloating, diarrhea, constipation, hematemesis, melena, hematochezia, jaundice or hemorrhoids Genitourinary: Denies dysuria, frequency,discharge, hematuria or flank pain. Has urgency, nocturia x 2-3 & occasional hesitancy. Musculoskeletal: Denies arthralgia, myalgia, stiffness, Jt. Swelling, pain, limp or strain/sprain. Denies Falls. Skin: Denies puritis, rash, hives, warts, acne, eczema or change in skin lesion Neuro: No weakness, tremor, incoordination, spasms, paresthesia or pain Psychiatric: Denies confusion, memory loss or sensory loss. Denies Depression. Endocrine:  Denies change in weight, skin, hair change, nocturia, and paresthesia, diabetic polys, visual blurring or hyper / hypo glycemic episodes.  Heme/Lymph: No excessive bleeding, bruising or enlarged lymph nodes.  Physical Exam  BP 110/60   Pulse 64   Temp (!) 97.3 F (36.3 C)   Resp 18   Ht _0  (1.753 m)   Wt 224 lb (101.6 kg)   BMI 33.08 kg/m   General Appearance: Over nourished and in no apparent distress.  Eyes: PERRLA, EOMs, conjunctiva no swelling or erythema, normal fundi and vessels. Sinuses: No frontal/maxillary tenderness ENT/Mouth: EACs patent / TMs  nl. Nares clear without erythema, swelling, mucoid exudates. Oral hygiene is good. No erythema, swelling, or exudate. Tongue normal, non-obstructing. Tonsils not swollen or erythematous. Hearing normal.  Neck: Supple, thyroid not palpable. No bruits, nodes or JVD. Respiratory: Respiratory effort  normal.  BS equal and clear bilateral without rales, rhonci, wheezing or stridor. Cardio: Heart sounds are normal with regular rate and rhythm and no murmurs, rubs or gallops. Peripheral pulses are normal and equal bilaterally without edema. No aortic or femoral bruits. Chest: symmetric with normal excursions and percussion.  Abdomen: Soft, with Nl bowel sounds. Nontender, no guarding, rebound, hernias, masses, or organomegaly.  Lymphatics: DRE - deferred for age. Musculoskeletal: Full ROM all peripheral extremities, joint stability, 5/5 strength, and normal gait. Skin: Warm and dry without rashes, lesions, cyanosis, clubbing or  ecchymosis.  Neuro: Cranial nerves intact, reflexes equal bilaterally. Normal muscle tone, no cerebellar symptoms.Sensation decreased in a stocking distribution  to touch, vibratory and Monofilament to the toes bilaterally. Pysch: Alert and oriented X 3 with normal affect, insight and judgment appropriate.   Assessment and Plan  1. Annual Preventative/Screening Exam   2. Essential hypertension  - EKG 12-Lead - [place], RETROPERITNL ABD,  LTD - Urinalysis, Routine w reflex microscopic - Microalbumin / creatinine urine ratio - CBC with Differential/Platelet - COMPLETE METABOLIC PANEL WITH GFR - Magnesium - TSH  3. Hyperlipidemia, mixed  - EKG 12-Lead - [place], RETROPERITNL ABD,  LTD - Lipid panel - TSH  4. Type 2 diabetes mellitus with stage 3 chronic kidney disease, without long-term current use of insulin (HCC)  - EKG 12-Lead - [place], RETROPERITNL ABD,  LTD - Urinalysis, Routine w reflex microscopic - Microalbumin / creatinine urine ratio - Hemoglobin A1c - Insulin, random  5. Vitamin D deficiency  - VITAMIN D 25 Hydroxyl  6. Diabetic sensory polyneuropathy (HCC)  - HM DIABETES FOOT EXAM - LOW EXTREMITY NEUR EXAM DOCUM - Hemoglobin A1c - Insulin, random  7. Gluttony   8. Screening for colorectal cancer  - POC Hemoccult Bld/Stl  9. BPH with  obstruction/lower urinary tract symptoms  - PSA  10. Prostate cancer screening - PSA  11. Screening for ischemic heart disease  - EKG 12-Lead  12. Screening for AAA (aortic abdominal aneurysm)  - [place], RETROPERITNL ABD,  LTD  13. Medication management  - Urinalysis, Routine w reflex microscopic - Microalbumin / creatinine urine ratio - Uric acid - CBC with Differential/Platelet - COMPLETE METABOLIC PANEL WITH GFR - Magnesium - Lipid panel - TSH - Hemoglobin A1c - Insulin, random - VITAMIN D 25 Hydroxyl              Patient was counseled in prudent diet, weight control to achieve/maintain BMI less than 25, BP monitoring, regular exercise and medications as discussed.  Discussed med effects and SE's. Routine screening labs and tests as requested with regular follow-up as recommended. Over  40 minutes of exam, counseling, chart review and high complex critical decision making was performed

## 2018-04-20 NOTE — Patient Instructions (Addendum)

## 2018-04-21 LAB — CBC WITH DIFFERENTIAL/PLATELET
Basophils Absolute: 49 cells/uL (ref 0–200)
Basophils Relative: 0.7 %
Eosinophils Absolute: 350 cells/uL (ref 15–500)
Eosinophils Relative: 5 %
HCT: 40.6 % (ref 38.5–50.0)
Hemoglobin: 13.4 g/dL (ref 13.2–17.1)
Lymphs Abs: 1456 cells/uL (ref 850–3900)
MCH: 28.7 pg (ref 27.0–33.0)
MCHC: 33 g/dL (ref 32.0–36.0)
MCV: 86.9 fL (ref 80.0–100.0)
MPV: 10.9 fL (ref 7.5–12.5)
Monocytes Relative: 10.4 %
Neutro Abs: 4417 cells/uL (ref 1500–7800)
Neutrophils Relative %: 63.1 %
Platelets: 176 10*3/uL (ref 140–400)
RBC: 4.67 10*6/uL (ref 4.20–5.80)
RDW: 13.1 % (ref 11.0–15.0)
Total Lymphocyte: 20.8 %
WBC mixed population: 728 cells/uL (ref 200–950)
WBC: 7 10*3/uL (ref 3.8–10.8)

## 2018-04-21 LAB — COMPLETE METABOLIC PANEL WITH GFR
AG Ratio: 2.1 (calc) (ref 1.0–2.5)
ALT: 16 U/L (ref 9–46)
AST: 16 U/L (ref 10–35)
Albumin: 4.4 g/dL (ref 3.6–5.1)
Alkaline phosphatase (APISO): 51 U/L (ref 40–115)
BUN/Creatinine Ratio: 18 (calc) (ref 6–22)
BUN: 33 mg/dL — ABNORMAL HIGH (ref 7–25)
CO2: 26 mmol/L (ref 20–32)
Calcium: 9.6 mg/dL (ref 8.6–10.3)
Chloride: 104 mmol/L (ref 98–110)
Creat: 1.79 mg/dL — ABNORMAL HIGH (ref 0.70–1.11)
GFR, Est African American: 40 mL/min/{1.73_m2} — ABNORMAL LOW (ref >=60)
GFR, Est Non African American: 35 mL/min/{1.73_m2} — ABNORMAL LOW (ref >=60)
Globulin: 2.1 g/dL (calc) (ref 1.9–3.7)
Glucose, Bld: 135 mg/dL — ABNORMAL HIGH (ref 65–99)
Potassium: 4.6 mmol/L (ref 3.5–5.3)
Sodium: 140 mmol/L (ref 135–146)
Total Bilirubin: 0.5 mg/dL (ref 0.2–1.2)
Total Protein: 6.5 g/dL (ref 6.1–8.1)

## 2018-04-21 LAB — PSA: PSA: 1.3 ng/mL (ref <=4.0)

## 2018-04-21 LAB — URINALYSIS, ROUTINE W REFLEX MICROSCOPIC
Bilirubin Urine: NEGATIVE
Glucose, UA: NEGATIVE
Hgb urine dipstick: NEGATIVE
Ketones, ur: NEGATIVE
Leukocytes, UA: NEGATIVE
Nitrite: NEGATIVE
Protein, ur: NEGATIVE
Specific Gravity, Urine: 1.021 (ref 1.001–1.03)
pH: 6 (ref 5.0–8.0)

## 2018-04-21 LAB — VITAMIN D 25 HYDROXY (VIT D DEFICIENCY, FRACTURES): Vit D, 25-Hydroxy: 79 ng/mL (ref 30–100)

## 2018-04-21 LAB — LIPID PANEL
Cholesterol: 146 mg/dL (ref <200)
HDL: 42 mg/dL (ref >40)
LDL Cholesterol (Calc): 76 mg/dL (calc)
Non-HDL Cholesterol (Calc): 104 mg/dL (calc) (ref <130)
Total CHOL/HDL Ratio: 3.5 (calc) (ref <5.0)
Triglycerides: 180 mg/dL — ABNORMAL HIGH (ref <150)

## 2018-04-21 LAB — INSULIN, RANDOM: Insulin: 3.2 u[IU]/mL (ref 2.0–19.6)

## 2018-04-21 LAB — HEMOGLOBIN A1C
Hgb A1c MFr Bld: 7.3 % of total Hgb — ABNORMAL HIGH (ref <5.7)
Mean Plasma Glucose: 163 (calc)
eAG (mmol/L): 9 (calc)

## 2018-04-21 LAB — MAGNESIUM: Magnesium: 1.9 mg/dL (ref 1.5–2.5)

## 2018-04-21 LAB — MICROALBUMIN / CREATININE URINE RATIO
Creatinine, Urine: 118 mg/dL (ref 20–320)
Microalb Creat Ratio: 8 mcg/mg creat (ref <30)
Microalb, Ur: 1 mg/dL

## 2018-04-21 LAB — TSH: TSH: 2.54 mIU/L (ref 0.40–4.50)

## 2018-04-21 LAB — URIC ACID: Uric Acid, Serum: 7.5 mg/dL (ref 4.0–8.0)

## 2018-04-24 ENCOUNTER — Encounter: Payer: Self-pay | Admitting: Internal Medicine

## 2018-05-14 ENCOUNTER — Other Ambulatory Visit: Payer: Self-pay | Admitting: Internal Medicine

## 2018-05-25 ENCOUNTER — Other Ambulatory Visit: Payer: Self-pay | Admitting: Adult Health

## 2018-07-28 NOTE — Progress Notes (Deleted)
ANNUAL MEDICARE WELLNESS AND 3 MONTH FOLLOW UP  Assessment and Plan:   Encounter for Annual Medicare Wellness Visit  Hypertension Continue medications Monitor blood pressure at home. Continue DASH diet.   Reminder to go to the ER if any CP, SOB, nausea, dizziness, severe HA, changes vision/speech, left arm numbness and tingling and jaw pain.  Cholesterol Continue medication: pravastatin 40 mg  Continue diet and exercise.  Check lipid panel.   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  Check A1C  Obesity with co morbidities Long discussion about weight loss, diet, and exercise Discussed ideal weight for height (below 176) and initial weight goal (245) Patient will work on increasing vegetables and increase length and frequency of exercise Will follow up in 3 months  Vitamin D Def At goal at recent check; continue to recommend supplementation for goal of 70-100 Defer vitamin D level   Type 2 diabetes mellitus with complication, without long-term current use of insulin (HCC) Continue medications: metformin 500 mg 2 tab BID, glipizide 5 mg 1/2-1 tab BID, cinnamon 1000 mg BID Long discussion about dietary choices and physical activity Perform daily foot/skin check, notify office of any concerning changes.  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[place]) No wounds at this time; has had recent diabetic foot exam Reminded patient to check feet daily for wounds Patient denies podiatry referral today but will consider in the  future  History of colonic polyps Patient refuses follow up colonoscopy, discussed cologuard which patient refuses at this time, will continue to recommend Continue ASA daily  Non compliance with medical treatment Discussed barriers, patient unwilling to make significant changes in diet, but agrees to increase exercise somewhat  Gluttony Discussed concerns with current diet and impact on long term outcomes related to diabetes and cardiovascular health.  Medication management -     CBC with Differential/Platelet -     CMP/GFR  Need for influenza vaccination Flu vaccine given today  Continue diet and meds as discussed. Further disposition pending results of labs. Discussed med's effects and SE's.   Over 30 minutes of exam, counseling, chart review, and critical decision making was performed.   Future Appointments  Date Time Provider [place]  07/29/2018  9:00 AM Liane Comber, NP [facility] None  11/03/2018  9:30 AM Unk Pinto, MD [facility] None  05/13/2019  9:00 AM Unk Pinto, MD [facility] None   During the course of the visit the patient was educated and counseled about appropriate screening and preventive services including:    Pneumococcal vaccine   Influenza vaccine  Td vaccine  Screening electrocardiogram  Colorectal cancer screening  Diabetes screening  Glaucoma screening  Nutrition counseling    -------------------------------------------------------------------------------------------------------------------------  HPI 83 y.o. male  presents for 3 month follow up on hypertension, cholesterol, diabetes with CKD and neuropathy, weight and vitamin D deficiency. The patient has been exercising (10 min on stationary bike twice a week), but has not made significant changes to his diet. He does not present with a sugar log, but reports they have been running "about 150" - reports this AM fasting was 147. The patient discusses  his wife is a  Systems developer, and he has no intentions of telling her how to do things. He apparently does enjoy vegetables, encouraged to increase these (avoid fried varieties) if he has no intention of cutting down on breads and potatoes.   BMI is There is no height or weight on file to calculate BMI., he has not been working on diet, but is riding a stationary bike twice a week. Wt Readings from Last 3 Encounters:  04/20/18 224 lb (101.6 kg)  01/18/18 231 lb (104.8 kg)  11/09/17 242 lb (109.8 kg)   He does not check his BP at home, today their BP is     He does workout. He denies chest pain, shortness of breath, dizziness.    He is on cholesterol medication (pravastatin 40 mg daily) and denies myalgias. His cholesterol is not at goal. The cholesterol last visit was:   Lab Results  Component Value Date   CHOL 146 04/20/2018   HDL 42 04/20/2018   LDLCALC 76 04/20/2018   TRIG 180 (H) 04/20/2018   CHOLHDL 3.5 04/20/2018    He has been working on diet and exercise for diabetes (treated by metformin, glipizide 5 mg TID, cinnamon supplement), and denies hyperglycemia, hypoglycemia , increased appetite, nausea, polydipsia and polyuria. Last A1C in the office was improved from previous but above goal:  Lab Results  Component Value Date   HGBA1C 7.3 (H) 04/20/2018   Patient is on Vitamin D supplement and at goal:  Lab Results  Component Value Date   VD25OH 79 04/20/2018        Current Medications:  Current Outpatient Medications on File Prior to Visit  Medication Sig  . aspirin 81 MG tablet Take 1 tablet (81 mg total) by mouth daily. Stop and restart on 09/04/11  . Cinnamon 500 MG TABS Take 1,000 mg by mouth.  . doxazosin (CARDURA) 8 MG tablet TAKE 1 TABLET BY MOUTH ONCE DAILY FOR BLOOD PRESSURE/PROSTATE (Patient taking differently: TAKE 1/2 TABLET BY MOUTH ONCE DAILY FOR BLOOD PRESSURE/PROSTATE)  . glipiZIDE (GLUCOTROL) 5 MG tablet TAKE 1/2 TO 1 TABLET BY MOUTH THREE TIMES DAILY WITH MEALS FOR  DIABETES (Patient not taking: Reported on 04/20/2018)  . glucose blood (FREESTYLE TEST STRIPS) test strip Check blood sugar 1 time daily-DX-E11.22.  Marland Kitchen glucose monitoring kit (FREESTYLE) monitoring kit check blood sugar 1 time daily-DX-E11.22.  . hydrochlorothiazide (HYDRODIURIL) 25 MG tablet TAKE 1 TABLET BY MOUTH EVERY DAY FOR BLOOD PRESSURE AND FLUID  . Lancets (FREESTYLE) lancets Check blood sugar 1 time daily-DX-E11.22.  . lisinopril (PRINIVIL,ZESTRIL) 40 MG tablet TAKE 1 TABLET BY MOUTH EVERY DAY FOR BLOOD PRESSURE AND DIABETIC KIDNEY PROTECTION  . magnesium oxide (MAG-OX) 400 MG tablet Take 400 mg by mouth daily.  . metFORMIN (GLUCOPHAGE-XR) 500 MG 24 hr tablet TAKE 2 TABLETS BY MOUTH TWICE DAILY  . Multiple Vitamins-Minerals (MULTIVITAMIN WITH MINERALS) tablet Take 1 tablet by mouth daily.    Marland Kitchen neomycin-polymyxin b-dexamethasone (MAXITROL) 3.5-10000-0.1 SUSP 1 to 2 drops to the affected eye every 4 hours or 4 x /day  . Nutritional Supplements (VITAMIN D MAINTENANCE PO) Take 10,000 mg by mouth daily.    . pravastatin (PRAVACHOL) 40 MG tablet TAKE 1 TABLET BY MOUTH AT BEDTIME FOR CHOLESTEROL   No current facility-administered medications on file prior to visit.     Medical History:  Past Medical History:  Diagnosis Date  . Arthritis   . BPH (benign prostatic hyperplasia)   . Cataract   . CKD (  chronic kidney disease), stage III ([place])   . Depression    "usually a happy go lucky"  . Diabetes mellitus   . Diverticulosis   . GERD (gastroesophageal reflux disease)    sometimes will take omeprazole  . Hyperlipidemia   . Hypertension    Allergies No Known Allergies  SURGICAL HISTORY He  has a past surgical history that includes Tonsillectomy and adenoidectomy; Vein ligation and stripping; Knee arthroscopy; Colonoscopy (2005, 08/21/11); Fracture surgery; and Cholecystectomy (N/A, 11/29/2015). FAMILY HISTORY His family history includes Diabetes in his brother; Prostate cancer in his  brother. SOCIAL HISTORY He  reports that he has been smoking cigars. He has never used smokeless tobacco. He reports that he drinks alcohol. He reports that he does not use drugs.    Preventative care: Last colonoscopy: 2012 due 2015 but will not have another, declines *** hemoccult ***  Prior vaccinations: TD or Tdap: 2016 Influenza: 2018, DUE *** Pneumococcal: 2006 Prevnar13: 2015 Shingles/Zostavax: declines  Eye exam: last in 2016 - *** refer *** poor follow through *** Dentist: last in 06/2017, follow up in 07/2017 for cavity care, has full upper partials.   Patient Care Team: Unk Pinto, MD as PCP - General (Internal Medicine)   MEDICARE WELLNESS OBJECTIVES: Physical activity:   Cardiac risk factors:   Depression/mood screen:   Depression screen [facility] 2/9 04/20/2018  Decreased Interest 0  Down, Depressed, Hopeless 0  PHQ - 2 Score 0    ADLs:  In your present state of health, do you have any difficulty performing the following activities: 04/20/2018 10/15/2017  Hearing? N N  Vision? N N  Difficulty concentrating or making decisions? N N  Walking or climbing stairs? N N  Dressing or bathing? N N  Doing errands, shopping? N N  Some recent data might be hidden     Cognitive Testing  Alert? Yes  Normal Appearance?Yes  Oriented to person? Yes  Place? Yes   Time? Yes  Recall of three objects?  Yes  Can perform simple calculations? Yes  Displays appropriate judgment?Yes  Can read the correct time from a watch face?Yes  EOL planning:     Review of Systems:  Review of Systems  Constitutional: Negative for malaise/fatigue and weight loss.  HENT: Negative for hearing loss and tinnitus.   Eyes: Negative for blurred vision and double vision.  Respiratory: Negative for cough, sputum production, shortness of breath and wheezing.   Cardiovascular: Negative for chest pain, palpitations, orthopnea, claudication and leg swelling.  Gastrointestinal: Negative for  abdominal pain, blood in stool, constipation, diarrhea, heartburn, melena, nausea and vomiting.  Genitourinary: Negative.   Musculoskeletal: Negative for falls, joint pain and myalgias.  Skin: Negative for rash.  Neurological: Positive for tingling (Intermittent in bilateral feet). Negative for dizziness, sensory change, weakness and headaches.  Endo/Heme/Allergies: Negative for polydipsia.  Psychiatric/Behavioral: Negative.  Negative for depression and memory loss. The patient is not nervous/anxious and does not have insomnia.   All other systems reviewed and are negative.   Physical Exam: There were no vitals taken for this visit. Wt Readings from Last 3 Encounters:  04/20/18 224 lb (101.6 kg)  01/18/18 231 lb (104.8 kg)  11/09/17 242 lb (109.8 kg)   General Appearance: Well nourished, obese, in no apparent distress. Eyes: PERRLA, EOMs, conjunctiva no swelling or erythema Sinuses: No Frontal/maxillary tenderness ENT/Mouth: Ext aud canals clear, L TMs without erythema, bulging, R TM with extensive scarring, erythematous around bony structures (per the patient he has chronic drainage issue  that has been cleared by ENT). No erythema, swelling, or exudate on post pharynx.  Tonsils not swollen or erythematous. Hearing normal.  Neck: Supple, thyroid normal.  Respiratory: Respiratory effort normal, BS equal bilaterally without rales, rhonchi, wheezing or stridor.  Cardio: RRR with no MRGs. 1+ peripheral pulses equally with 2+ edema bilateral ankles. *** Abdomen: Soft, rounded obese abdomen + BS.  Non tender, no guarding, rebound, palpable hernias or masses. Lymphatics: Non tender without lymphadenopathy.  Musculoskeletal: Full ROM, 5/5 strength, Normal gait Skin: Warm, dry without rashes, lesions, ecchymosis.  Neuro: Cranial nerves intact. No cerebellar symptoms. Sensation in heels deminished bilaterally to monofilament testing.  Psych: Awake and oriented X 3, normal affect, Insight and  Judgment appropriate.   Medicare Attestation I have personally reviewed: The patient's medical and social history Their use of alcohol, tobacco or illicit drugs Their current medications and supplements The patient's functional ability including ADLs,fall risks, home safety risks, cognitive, and hearing and visual impairment Diet and physical activities Evidence for depression or mood disorders  The patient's weight, height, BMI, and visual acuity have been recorded in the chart.  I have made referrals, counseling, and provided education to the patient based on review of the above and I have provided the patient with a written personalized care plan for preventive services.    Izora Ribas, NP 12:51 PM [facility]

## 2018-07-29 ENCOUNTER — Ambulatory Visit: Payer: Medicare Other | Admitting: Adult Health

## 2018-07-29 ENCOUNTER — Ambulatory Visit: Payer: Self-pay | Admitting: Adult Health

## 2018-07-29 ENCOUNTER — Encounter: Payer: Self-pay | Admitting: Adult Health

## 2018-07-29 VITALS — BP 116/54 | HR 57 | Temp 97.7°F | Ht 69.0 in | Wt 237.0 lb

## 2018-07-29 DIAGNOSIS — E1169 Type 2 diabetes mellitus with other specified complication: Secondary | ICD-10-CM

## 2018-07-29 DIAGNOSIS — Z0001 Encounter for general adult medical examination with abnormal findings: Secondary | ICD-10-CM | POA: Diagnosis not present

## 2018-07-29 DIAGNOSIS — R632 Polyphagia: Secondary | ICD-10-CM

## 2018-07-29 DIAGNOSIS — Z23 Encounter for immunization: Secondary | ICD-10-CM

## 2018-07-29 DIAGNOSIS — R6889 Other general symptoms and signs: Secondary | ICD-10-CM | POA: Diagnosis not present

## 2018-07-29 DIAGNOSIS — Z9119 Patient's noncompliance with other medical treatment and regimen: Secondary | ICD-10-CM

## 2018-07-29 DIAGNOSIS — N183 Chronic kidney disease, stage 3 (moderate): Secondary | ICD-10-CM

## 2018-07-29 DIAGNOSIS — E785 Hyperlipidemia, unspecified: Secondary | ICD-10-CM

## 2018-07-29 DIAGNOSIS — Z91199 Patient's noncompliance with other medical treatment and regimen due to unspecified reason: Secondary | ICD-10-CM

## 2018-07-29 DIAGNOSIS — E1122 Type 2 diabetes mellitus with diabetic chronic kidney disease: Secondary | ICD-10-CM

## 2018-07-29 DIAGNOSIS — E1142 Type 2 diabetes mellitus with diabetic polyneuropathy: Secondary | ICD-10-CM

## 2018-07-29 DIAGNOSIS — I10 Essential (primary) hypertension: Secondary | ICD-10-CM

## 2018-07-29 DIAGNOSIS — Z79899 Other long term (current) drug therapy: Secondary | ICD-10-CM | POA: Diagnosis not present

## 2018-07-29 DIAGNOSIS — Z0000 Encounter for general adult medical examination without abnormal findings: Secondary | ICD-10-CM

## 2018-07-29 DIAGNOSIS — E559 Vitamin D deficiency, unspecified: Secondary | ICD-10-CM

## 2018-07-29 DIAGNOSIS — Z86010 Personal history of colonic polyps: Secondary | ICD-10-CM

## 2018-07-29 DIAGNOSIS — E669 Obesity, unspecified: Secondary | ICD-10-CM

## 2018-07-29 DIAGNOSIS — E118 Type 2 diabetes mellitus with unspecified complications: Secondary | ICD-10-CM

## 2018-07-29 NOTE — Patient Instructions (Addendum)
  Edwin Osborne , Thank you for taking time to come for your Medicare Wellness Visit. I appreciate your ongoing commitment to your health goals. Please review the following plan we discussed and let me know if I can assist you in the future.   These are the goals we discussed: Goals    . Exercise 5x per week (15 min per time)    . HEMOGLOBIN A1C < 7.0    . Weight (lb) < 225 lb (102.1 kg)       This is a list of the screening recommended for you and due dates:  Health Maintenance  Topic Date Due  . Eye exam for diabetics  09/09/2015  . Hemoglobin A1C  10/21/2018  . Complete foot exam   04/21/2019  . Colon Cancer Screening  12/18/2019  . Tetanus Vaccine  08/30/2025  . Flu Shot  Completed  . Pneumonia vaccines  Completed    Know what a healthy weight is for you (roughly BMI <25) and aim to maintain this  Aim for 7+ servings of fruits and vegetables daily  65-80+ fluid ounces of water or unsweet tea for healthy kidneys  Limit to max 1 drink of alcohol per day; avoid smoking/tobacco  Limit animal fats in diet for cholesterol and heart health - choose grass fed whenever available  Avoid highly processed foods, and foods high in saturated/trans fats  Aim for low stress - take time to unwind and care for your mental health  Aim for 150 min of moderate intensity exercise weekly for heart health, and weights twice weekly for bone health  Aim for 7-9 hours of sleep daily

## 2018-07-29 NOTE — Progress Notes (Signed)
ANNUAL MEDICARE WELLNESS AND 3 MONTH FOLLOW UP  Assessment and Plan:   Encounter for Annual Medicare Wellness Visit  Hypertension Continue medications Monitor blood pressure at home. Continue DASH diet.   Reminder to go to the ER if any CP, SOB, nausea, dizziness, severe HA, changes vision/speech, left arm numbness and tingling and jaw pain.  Cholesterol Continue medication: pravastatin 40 mg  Continue diet and exercise.  Check lipid panel.   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  Check A1C  Obesity with co morbidities Long discussion about weight loss, diet, and exercise Discussed ideal weight for height (below 176) and initial weight goal (245) Patient will work on increasing vegetables and increase length and frequency of exercise Will follow up in 3 months  Vitamin D Def At goal at recent check; continue to recommend supplementation for goal of 70-100 Defer vitamin D level   Type 2 diabetes mellitus with complication, without long-term current use of insulin (HCC) Continue medications: metformin 500 mg 2 tab BID, cinnamon 1000 mg BID Poor follow through to schedule diabetic eye - referral placed today Long discussion about dietary choices and physical activity Perform daily foot/skin check, notify office of any concerning changes.  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[place]) No wounds at this time; has had recent diabetic foot exam Reminded patient to check feet daily for wounds Patient denies podiatry referral today  but will consider in the future  History of colonic polyps Patient refuses follow up colonoscopy, discussed cologuard which patient refuses at this time, will continue to recommend, also declines hemoccult despite strong recommendation Continue ASA daily  Non compliance with medical treatment Discussed barriers, patient willing to work on diet, agrees to increase exercise somewhat  Gluttony Discussed concerns with current diet and impact on long term outcomes related to diabetes and cardiovascular health. He does well with his wife fixing his plate and will resume this  Medication management -     CBC with Differential/Platelet -     CMP/GFR  Need for influenza vaccination Flu vaccine given today  Continue diet and meds as discussed. Further disposition pending results of labs. Discussed med's effects and SE's.   Over 30 minutes of exam, counseling, chart review, and critical decision making was performed.   Future Appointments  Date Time Provider [place]  11/03/2018  9:30 AM Unk Pinto, MD [facility] None  05/13/2019  9:00 AM Unk Pinto, MD [facility] None   During the course of the visit the patient was educated and counseled about appropriate screening and preventive services including:    Pneumococcal vaccine   Influenza vaccine  Td vaccine  Screening electrocardiogram  Colorectal cancer screening  Diabetes screening  Glaucoma screening  Nutrition counseling    -------------------------------------------------------------------------------------------------------------------------  HPI 83 y.o. male  presents for 3 month follow up on hypertension, cholesterol, diabetes with CKD and neuropathy, weight and vitamin D deficiency.   BMI is Body mass index is 35 kg/m., he has not been working on diet, but is riding a stationary bike twice a week. He gained weight and wife has put him back on diet,  she fixes his plate.  Wt Readings from Last 3  Encounters:  07/29/18 237 lb (107.5 kg)  04/20/18 224 lb (101.6 kg)  01/18/18 231 lb (104.8 kg)   He does not check his BP at home, today their BP is BP: (!) 116/54   He does workout. He denies chest pain, shortness of breath, dizziness.    He is on cholesterol medication (pravastatin 40 mg daily) and denies myalgias. His cholesterol is not at goal. The cholesterol last visit was:   Lab Results  Component Value Date   CHOL 146 04/20/2018   HDL 42 04/20/2018   LDLCALC 76 04/20/2018   TRIG 180 (H) 04/20/2018   CHOLHDL 3.5 04/20/2018    He has been working on diet and exercise for diabetes (treated by metformin, cinnamon supplement), and denies hyperglycemia, hypoglycemia , increased appetite, nausea, polydipsia and polyuria. He does check sporadic fasting glucose, reports averaging 140, was down to 100 or so previously and knows he needs to improve his diet. Last A1C in the office wasl:  Lab Results  Component Value Date   HGBA1C 7.3 (H) 04/20/2018   Patient is on Vitamin D supplement and at goal:  Lab Results  Component Value Date   VD25OH 79 04/20/2018        Current Medications:  Current Outpatient Medications on File Prior to Visit  Medication Sig  . aspirin 81 MG tablet Take 1 tablet (81 mg total) by mouth daily. Stop and restart on 09/04/11  . Cinnamon 500 MG TABS Take 1,000 mg by mouth.  . doxazosin (CARDURA) 8 MG tablet TAKE 1 TABLET BY MOUTH ONCE DAILY FOR BLOOD PRESSURE/PROSTATE (Patient taking differently: TAKE 1/2 TABLET BY MOUTH ONCE DAILY FOR BLOOD PRESSURE/PROSTATE)  . glucose blood (FREESTYLE TEST STRIPS) test strip Check blood sugar 1 time daily-DX-E11.22.  Marland Kitchen glucose monitoring kit (FREESTYLE) monitoring kit check blood sugar 1 time daily-DX-E11.22.  . hydrochlorothiazide (HYDRODIURIL) 25 MG tablet TAKE 1 TABLET BY MOUTH EVERY DAY FOR BLOOD PRESSURE AND FLUID  . Lancets (FREESTYLE) lancets Check blood sugar 1 time daily-DX-E11.22.  . lisinopril  (PRINIVIL,ZESTRIL) 40 MG tablet TAKE 1 TABLET BY MOUTH EVERY DAY FOR BLOOD PRESSURE AND DIABETIC KIDNEY PROTECTION  . magnesium oxide (MAG-OX) 400 MG tablet Take 400 mg by mouth daily.  . metFORMIN (GLUCOPHAGE-XR) 500 MG 24 hr tablet TAKE 2 TABLETS BY MOUTH TWICE DAILY  . Multiple Vitamins-Minerals (MULTIVITAMIN WITH MINERALS) tablet Take 1 tablet by mouth daily.    Marland Kitchen neomycin-polymyxin b-dexamethasone (MAXITROL) 3.5-10000-0.1 SUSP 1 to 2 drops to the affected eye every 4 hours or 4 x /day  . Nutritional Supplements (VITAMIN D MAINTENANCE PO) Take 10,000 mg by mouth daily.    . pravastatin (PRAVACHOL) 40 MG tablet TAKE 1 TABLET BY MOUTH AT BEDTIME FOR CHOLESTEROL  . glipiZIDE (GLUCOTROL) 5 MG tablet TAKE 1/2 TO 1 TABLET BY MOUTH THREE TIMES DAILY WITH MEALS FOR DIABETES (Patient not taking: Reported on 04/20/2018)   No current facility-administered medications on file prior to visit.     Medical History:  Past Medical History:  Diagnosis Date  . Arthritis   . BPH (benign prostatic hyperplasia)   . Cataract   . CKD (chronic kidney disease), stage III ([place])   . Depression    "usually a happy go lucky"  . Diabetes mellitus   . Diverticulosis   . GERD (gastroesophageal reflux disease)    sometimes will take omeprazole  . Hyperlipidemia   . Hypertension    Allergies No Known  Allergies  SURGICAL HISTORY He  has a past surgical history that includes Tonsillectomy and adenoidectomy; Vein ligation and stripping; Knee arthroscopy; Colonoscopy (2005, 08/21/11); Fracture surgery; and Cholecystectomy (N/A, 11/29/2015). FAMILY HISTORY His family history includes Diabetes in his brother; Prostate cancer in his brother. SOCIAL HISTORY He  reports that he has been smoking cigars. He has never used smokeless tobacco. He reports that he drinks alcohol. He reports that he does not use drugs.  Preventative care: Last colonoscopy: 2012 due 2015 but will not have another, declines, also won't do  hemoccult  Prior vaccinations: TD or Tdap: 2016 Influenza: 2018, 2019 Pneumococcal: 2006 Prevnar13: 2015 Shingles/Zostavax: declines  Eye exam: last in 2016 - will refer due to poor follow through  Dentist: last in 06/2017, follow up in 07/2017 for cavity care, has full upper partials.   Patient Care Team: Unk Pinto, MD as PCP - General (Internal Medicine)   MEDICARE WELLNESS OBJECTIVES: Physical activity: Current Exercise Habits: Home exercise routine, Type of exercise: Other - see comments(stationary bike), Time (Minutes): 10, Frequency (Times/Week): 2, Weekly Exercise (Minutes/Week): 20, Intensity: Mild, Exercise limited by: None identified Cardiac risk factors: Cardiac Risk Factors include: dyslipidemia;hypertension;male gender;sedentary lifestyle;obesity (BMI >30kg/m2);advanced age (>38mn, >>46women);diabetes mellitus;smoking/ tobacco exposure Depression/mood screen:   [facility]  Decreased Interest 0  Down, Depressed, Hopeless 0  PHQ - 2 Score 0    ADLs:  In your present state of health, do you have any difficulty performing the following activities: 07/29/2018 04/20/2018  Hearing? N N  Vision? N N  Difficulty concentrating or making decisions? N N  Walking or climbing stairs? N N  Dressing or bathing? N N  Doing errands, shopping? N N  Some recent data might be hidden     Cognitive Testing  Alert? Yes  Normal Appearance?Yes  Oriented to person? Yes  Place? Yes   Time? Yes  Recall of three objects?  Yes  Can perform simple calculations? Yes  Displays appropriate judgment?Yes  Can read the correct time from a watch face?Yes  EOL planning: Does Patient Have a Medical Advance Directive?: Yes Type of Advance Directive: Healthcare Power of Attorney, Living will Does patient want to make changes to medical advance directive?: No - Patient declined [place] of [place]?: No - copy requested   Review of Systems:   Review of Systems  Constitutional: Negative for malaise/fatigue and weight loss.  HENT: Negative for hearing loss and tinnitus.   Eyes: Negative for blurred vision and double vision.  Respiratory: Negative for cough, sputum production, shortness of breath and wheezing.   Cardiovascular: Negative for chest pain, palpitations, orthopnea, claudication and leg swelling.  Gastrointestinal: Negative for abdominal pain, blood in stool, constipation, diarrhea, heartburn, melena, nausea and vomiting.  Genitourinary: Negative.   Musculoskeletal: Negative for falls, joint pain and myalgias.  Skin: Negative for rash.  Neurological: Positive for tingling (Intermittent in bilateral feet). Negative for dizziness, sensory change, weakness and headaches.  Endo/Heme/Allergies: Negative for polydipsia.  Psychiatric/Behavioral: Negative.  Negative for depression and memory loss. The patient is not nervous/anxious and does not have insomnia.   All other systems reviewed and are negative.   Physical Exam: BP (!) 116/54   Pulse (!) 57   Temp 97.7 F (36.5 C)   Ht 5' 9" (1.753 m)   Wt 237 lb (107.5 kg)   SpO2 95%   BMI 35.00 kg/m  Wt Readings from Last 3 Encounters:  07/29/18 237 lb (107.5 kg)  04/20/18 224 lb (101.6 kg)  01/18/18 231 lb (104.8 kg)   General Appearance: Well nourished, obese, in no apparent distress. Eyes: PERRLA, EOMs, conjunctiva no swelling or erythema Sinuses: No Frontal/maxillary tenderness ENT/Mouth: Ext aud canals clear, L TMs without erythema, bulging, R TM with extensive scarring, erythematous around bony structures (per the patient he has chronic drainage issue that has been cleared by ENT). No erythema, swelling, or exudate on post pharynx.  Tonsils not swollen or erythematous. Hearing normal.  Neck: Supple, thyroid normal.  Respiratory: Respiratory effort normal, BS equal bilaterally without rales, rhonchi, wheezing or stridor.  Cardio: RRR with no MRGs. 1+ peripheral  pulses equally with 1+ non-pitting edema bilateral ankles.  Abdomen: Soft, rounded obese abdomen + BS.  Non tender, no guarding, rebound, palpable hernias or masses. Lymphatics: Non tender without lymphadenopathy.  Musculoskeletal: Full ROM, 5/5 strength, Normal gait Skin: Warm, dry without rashes, lesions, ecchymosis.  Neuro: Cranial nerves intact. No cerebellar symptoms. Sensation in heels deminished bilaterally to monofilament testing.  Psych: Awake and oriented X 3, normal affect, Insight and Judgment appropriate.   Medicare Attestation I have personally reviewed: The patient's medical and social history Their use of alcohol, tobacco or illicit drugs Their current medications and supplements The patient's functional ability including ADLs,fall risks, home safety risks, cognitive, and hearing and visual impairment Diet and physical activities Evidence for depression or mood disorders  The patient's weight, height, BMI, and visual acuity have been recorded in the chart.  I have made referrals, counseling, and provided education to the patient based on review of the above and I have provided the patient with a written personalized care plan for preventive services.    Izora Ribas, NP 10:01 AM [facility]

## 2018-07-30 LAB — CBC WITH DIFFERENTIAL/PLATELET
Basophils Absolute: 30 cells/uL (ref 0–200)
Basophils Relative: 0.4 %
Eosinophils Absolute: 340 cells/uL (ref 15–500)
Eosinophils Relative: 4.6 %
HCT: 40.6 % (ref 38.5–50.0)
Hemoglobin: 13.5 g/dL (ref 13.2–17.1)
Lymphs Abs: 1650 cells/uL (ref 850–3900)
MCH: 29.9 pg (ref 27.0–33.0)
MCHC: 33.3 g/dL (ref 32.0–36.0)
MCV: 89.8 fL (ref 80.0–100.0)
MPV: 11.4 fL (ref 7.5–12.5)
Monocytes Relative: 9.4 %
Neutro Abs: 4684 cells/uL (ref 1500–7800)
Neutrophils Relative %: 63.3 %
Platelets: 177 10*3/uL (ref 140–400)
RBC: 4.52 10*6/uL (ref 4.20–5.80)
RDW: 12.7 % (ref 11.0–15.0)
Total Lymphocyte: 22.3 %
WBC mixed population: 696 cells/uL (ref 200–950)
WBC: 7.4 10*3/uL (ref 3.8–10.8)

## 2018-07-30 LAB — COMPLETE METABOLIC PANEL WITH GFR
AG Ratio: 2 (calc) (ref 1.0–2.5)
ALT: 16 U/L (ref 9–46)
AST: 14 U/L (ref 10–35)
Albumin: 4.2 g/dL (ref 3.6–5.1)
Alkaline phosphatase (APISO): 55 U/L (ref 40–115)
BUN/Creatinine Ratio: 17 (calc) (ref 6–22)
BUN: 28 mg/dL — ABNORMAL HIGH (ref 7–25)
CO2: 26 mmol/L (ref 20–32)
Calcium: 9.6 mg/dL (ref 8.6–10.3)
Chloride: 104 mmol/L (ref 98–110)
Creat: 1.66 mg/dL — ABNORMAL HIGH (ref 0.70–1.11)
GFR, Est African American: 44 mL/min/{1.73_m2} — ABNORMAL LOW (ref >=60)
GFR, Est Non African American: 38 mL/min/{1.73_m2} — ABNORMAL LOW (ref >=60)
Globulin: 2.1 g/dL (calc) (ref 1.9–3.7)
Glucose, Bld: 180 mg/dL — ABNORMAL HIGH (ref 65–99)
Potassium: 4.7 mmol/L (ref 3.5–5.3)
Sodium: 139 mmol/L (ref 135–146)
Total Bilirubin: 0.5 mg/dL (ref 0.2–1.2)
Total Protein: 6.3 g/dL (ref 6.1–8.1)

## 2018-07-30 LAB — TSH: TSH: 2.19 mIU/L (ref 0.40–4.50)

## 2018-07-30 LAB — HEMOGLOBIN A1C
Hgb A1c MFr Bld: 8.1 % of total Hgb — ABNORMAL HIGH (ref <5.7)
Mean Plasma Glucose: 186 (calc)
eAG (mmol/L): 10.3 (calc)

## 2018-07-30 LAB — LIPID PANEL
Cholesterol: 151 mg/dL (ref <200)
HDL: 49 mg/dL (ref >40)
LDL Cholesterol (Calc): 72 mg/dL (calc)
Non-HDL Cholesterol (Calc): 102 mg/dL (calc) (ref <130)
Total CHOL/HDL Ratio: 3.1 (calc) (ref <5.0)
Triglycerides: 200 mg/dL — ABNORMAL HIGH (ref <150)

## 2018-07-30 LAB — MAGNESIUM: Magnesium: 1.8 mg/dL (ref 1.5–2.5)

## 2018-08-13 ENCOUNTER — Other Ambulatory Visit: Payer: Self-pay | Admitting: Physician Assistant

## 2018-08-20 ENCOUNTER — Other Ambulatory Visit: Payer: Self-pay | Admitting: Internal Medicine

## 2018-08-28 ENCOUNTER — Other Ambulatory Visit: Payer: Self-pay | Admitting: Internal Medicine

## 2018-09-22 HISTORY — PX: CATARACT EXTRACTION, BILATERAL: SHX1313

## 2018-10-07 ENCOUNTER — Other Ambulatory Visit: Payer: Self-pay | Admitting: Internal Medicine

## 2018-11-02 ENCOUNTER — Encounter: Payer: Self-pay | Admitting: Internal Medicine

## 2018-11-02 DIAGNOSIS — Z6836 Body mass index (BMI) 36.0-36.9, adult: Secondary | ICD-10-CM

## 2018-11-02 DIAGNOSIS — E6609 Other obesity due to excess calories: Secondary | ICD-10-CM | POA: Insufficient documentation

## 2018-11-02 DIAGNOSIS — E669 Obesity, unspecified: Secondary | ICD-10-CM | POA: Insufficient documentation

## 2018-11-02 DIAGNOSIS — E782 Mixed hyperlipidemia: Secondary | ICD-10-CM | POA: Insufficient documentation

## 2018-11-02 NOTE — Patient Instructions (Addendum)
Recommend Adult Low Dose Aspirin or  coated  Aspirin 81 mg daily  To reduce risk of Colon Cancer 20 %,  Skin Cancer 26 % ,  Melanoma 46%  and  Pancreatic cancer 60% +++++++++++++++++++++++++ Vitamin D goal  is between 70-100.  Please make sure that you are taking your Vitamin D as directed.  It is very important as a natural anti-inflammatory  helping hair, skin, and nails, as well as reducing stroke and heart attack risk.  It helps your bones and helps with mood. It also decreases numerous cancer risks so please take it as directed.  Low Vit D is associated with a 200-300% higher risk for CANCER  and 200-300% higher risk for HEART   ATTACK  &  STROKE.   ...................................... It is also associated with higher death rate at younger ages,  autoimmune diseases like Rheumatoid arthritis, Lupus, Multiple Sclerosis.    Also many other serious conditions, like depression, Alzheimer's Dementia, infertility, muscle aches, fatigue, fibromyalgia - just to name a few. ++++++++++++++++++++ Recommend the book "The END of DIETING" by Dr Joel Fuhrman  & the book "The END of DIABETES " by Dr Joel Fuhrman At [employer] - get book & Audio CD's    Being diabetic has a  300% increased risk for heart attack, stroke, cancer, and alzheimer- type vascular dementia. It is very important that you work harder with diet by avoiding all foods that are white. Avoid white rice (brown & wild rice is OK), white potatoes (sweetpotatoes in moderation is OK), White bread or wheat bread or anything made out of white flour like bagels, donuts, rolls, buns, biscuits, cakes, pastries, cookies, pizza crust, and pasta (made from white flour & egg whites) - vegetarian pasta or spinach or wheat pasta is OK. Multigrain breads like Arnold's or [employer], or multigrain sandwich thins or flatbreads.  Diet, exercise and weight loss can reverse and cure diabetes in the early stages.  Diet, exercise and weight loss is  very important in the control and prevention of complications of diabetes which affects every system in your body, ie. Brain - dementia/stroke, eyes - glaucoma/blindness, heart - heart attack/heart failure, kidneys - dialysis, stomach - gastric paralysis, intestines - malabsorption, nerves - severe painful neuritis, circulation - gangrene & loss of a leg(s), and finally cancer and Alzheimers.    I recommend avoid fried & greasy foods,  sweets/candy, white rice (brown or wild rice or Quinoa is OK), white potatoes (sweet potatoes are OK) - anything made from white flour - bagels, doughnuts, rolls, buns, biscuits,white and wheat breads, pizza crust and traditional pasta made of white flour & egg white(vegetarian pasta or spinach or wheat pasta is OK).  Multi-grain bread is OK - like multi-grain flat bread or sandwich thins. Avoid alcohol in excess. Exercise is also important.    Eat all the vegetables you want - avoid meat, especially red meat and dairy - especially cheese.  Cheese is the most concentrated form of trans-fats which is the worst thing to clog up our arteries. Veggie cheese is OK which can be found in the fresh produce section at [employer] or [employer] or [employer]  +++++++++++++++++++++ DASH Eating Plan  DASH stands for "Dietary Approaches to Stop Hypertension."   The DASH eating plan is a healthy eating plan that has been shown to reduce high blood pressure (hypertension). Additional health benefits may include reducing the risk of type 2 diabetes mellitus, heart disease, and stroke. The DASH eating plan may also   help with weight loss. WHAT DO I NEED TO KNOW ABOUT THE DASH EATING PLAN? For the DASH eating plan, you will follow these general guidelines:  Choose foods with a percent daily value for sodium of less than 5% (as listed on the food label).  Use salt-free seasonings or herbs instead of table salt or sea salt.  Check with your health care provider or pharmacist before  using salt substitutes.  Eat lower-sodium products, often labeled as "lower sodium" or "no salt added."  Eat fresh foods.  Eat more vegetables, fruits, and low-fat dairy products.  Choose whole grains. Look for the word "whole" as the first word in the ingredient list.  Choose fish   Limit sweets, desserts, sugars, and sugary drinks.  Choose heart-healthy fats.  Eat veggie cheese   Eat more home-cooked food and less restaurant, buffet, and fast food.  Limit fried foods.  Cook foods using methods other than frying.  Limit canned vegetables. If you do use them, rinse them well to decrease the sodium.  When eating at a restaurant, ask that your food be prepared with less salt, or no salt if possible.                      WHAT FOODS CAN I EAT? Read Dr Joel Fuhrman's books on The End of Dieting & The End of Diabetes  Grains Whole grain or whole wheat bread. Brown rice. Whole grain or whole wheat pasta. Quinoa, bulgur, and whole grain cereals. Low-sodium cereals. Corn or whole wheat flour tortillas. Whole grain cornbread. Whole grain crackers. Low-sodium crackers.  Vegetables Fresh or frozen vegetables (raw, steamed, roasted, or grilled). Low-sodium or reduced-sodium tomato and vegetable juices. Low-sodium or reduced-sodium tomato sauce and paste. Low-sodium or reduced-sodium canned vegetables.   Fruits All fresh, canned (in natural juice), or frozen fruits.  Protein Products  All fish and seafood.  Dried beans, peas, or lentils. Unsalted nuts and seeds. Unsalted canned beans.  Dairy Low-fat dairy products, such as skim or 1% milk, 2% or reduced-fat cheeses, low-fat ricotta or cottage cheese, or plain low-fat yogurt. Low-sodium or reduced-sodium cheeses.  Fats and Oils Tub margarines without trans fats. Light or reduced-fat mayonnaise and salad dressings (reduced sodium). Avocado. Safflower, olive, or canola oils. Natural peanut or almond butter.  Other Unsalted popcorn  and pretzels. The items listed above may not be a complete list of recommended foods or beverages. Contact your dietitian for more options.  +++++++++++++++  WHAT FOODS ARE NOT RECOMMENDED? Grains/ White flour or wheat flour White bread. White pasta. White rice. Refined cornbread. Bagels and croissants. Crackers that contain trans fat.  Vegetables  Creamed or fried vegetables. Vegetables in a . Regular canned vegetables. Regular canned tomato sauce and paste. Regular tomato and vegetable juices.  Fruits Dried fruits. Canned fruit in light or heavy syrup. Fruit juice.  Meat and Other Protein Products Meat in general - RED meat & White meat.  Fatty cuts of meat. Ribs, chicken wings, all processed meats as bacon, sausage, bologna, salami, fatback, hot dogs, bratwurst and packaged luncheon meats.  Dairy Whole or 2% milk, cream, half-and-half, and cream cheese. Whole-fat or sweetened yogurt. Full-fat cheeses or blue cheese. Non-dairy creamers and whipped toppings. Processed cheese, cheese spreads, or cheese curds.  Condiments Onion and garlic salt, seasoned salt, table salt, and sea salt. Canned and packaged gravies. Worcestershire sauce. Tartar sauce. Barbecue sauce. Teriyaki sauce. Soy sauce, including reduced sodium. Steak sauce. Fish sauce. Oyster sauce. Cocktail   sauce. Horseradish. Ketchup and mustard. Meat flavorings and tenderizers. Bouillon cubes. Hot sauce. Tabasco sauce. Marinades. Taco seasonings. Relishes.  Fats and Oils Butter, stick margarine, lard, shortening and bacon fat. Coconut, palm kernel, or palm oils. Regular salad dressings.  Pickles and olives. Salted popcorn and pretzels.  The items listed above may not be a complete list of foods and beverages to avoid.   Obesity, Adult Obesity is the condition of having too much total body fat. Being overweight or obese means that your weight is greater than what is considered healthy for your body size. Obesity is determined  by a measurement called BMI. BMI is an estimate of body fat and is calculated from height and weight. For adults, a BMI of 30 or higher is considered obese. Obesity can eventually lead to other health concerns and major illnesses, including:  Stroke.  Coronary artery disease (CAD).  Type 2 diabetes.  Some types of cancer, including cancers of the colon, breast, uterus, and gallbladder.  Osteoarthritis.  High blood pressure (hypertension).  High cholesterol.  Sleep apnea.  Gallbladder stones.  Infertility problems. What are the causes? The main cause of obesity is taking in (consuming) more calories than your body uses for energy. Other factors that contribute to this condition may include:  Being born with genes that make you more likely to become obese.  Having a medical condition that causes obesity. These conditions include: ? Hypothyroidism. ? Polycystic ovarian syndrome (PCOS). ? Binge-eating disorder. ? Cushing syndrome.  Taking certain medicines, such as steroids, antidepressants, and seizure medicines.  Not being physically active (sedentary lifestyle).  Living where there are limited places to exercise safely or buy healthy foods.  Not getting enough sleep. What increases the risk? The following factors may increase your risk of this condition:  Having a family history of obesity.  Being a woman of African-American descent.  Being a man of Hispanic descent. What are the signs or symptoms? Having excessive body fat is the main symptom of this condition. How is this diagnosed? This condition may be diagnosed based on:  Your symptoms.  Your medical history.  A physical exam. Your health care provider may measure: ? Your BMI. If you are an adult with a BMI between 25 and less than 30, you are considered overweight. If you are an adult with a BMI of 30 or higher, you are considered obese. ? The distances around your hips and your waist (circumferences).  These may be compared to each other to help diagnose your condition. ? Your skinfold thickness. Your health care provider may gently pinch a fold of your skin and measure it. How is this treated? Treatment for this condition often includes changing your lifestyle. Treatment may include some or all of the following:  Dietary changes. Work with your health care provider and a dietitian to set a weight-loss goal that is healthy and reasonable for you. Dietary changes may include eating: ? Smaller portions. A portion size is the amount of a particular food that is healthy for you to eat at one time. This varies from person to person. ? Low-calorie or low-fat options. ? More whole grains, fruits, and vegetables.  Regular physical activity. This may include aerobic activity (cardio) and strength training.  Medicine to help you lose weight. Your health care provider may prescribe medicine if you are unable to lose 1 pound a week after 6 weeks of eating more healthily and doing more physical activity.  Surgery. Surgical options may include gastric  banding and gastric bypass. Surgery may be done if: ? Other treatments have not helped to improve your condition. ? You have a BMI of 40 or higher. ? You have life-threatening health problems related to obesity. Follow these instructions at home:  Eating and drinking   Follow recommendations from your health care provider about what you eat and drink. Your health care provider may advise you to: ? Limit fast foods, sweets, and processed snack foods. ? Choose low-fat options, such as low-fat milk instead of whole milk. ? Eat 5 or more servings of fruits or vegetables every day. ? Eat at home more often. This gives you more control over what you eat. ? Choose healthy foods when you eat out. ? Learn what a healthy portion size is. ? Keep low-fat snacks on hand. ? Avoid sugary drinks, such as soda, fruit juice, iced tea sweetened with sugar, and flavored  milk. ? Eat a healthy breakfast.  Drink enough water to keep your urine clear or pale yellow.  Do not go without eating for long periods of time (do not fast) or follow a fad diet. Fasting and fad diets can be unhealthy and even dangerous. Physical Activity  Exercise regularly, as told by your health care provider. Ask your health care provider what types of exercise are safe for you and how often you should exercise.  Warm up and stretch before being active.  Cool down and stretch after being active.  Rest between periods of activity. Lifestyle  Limit the time that you spend in front of your TV, computer, or video game system.  Find ways to reward yourself that do not involve food.  Limit alcohol intake to no more than 1 drink a day for nonpregnant women and 2 drinks a day for men. One drink equals 12 oz of beer, 5 oz of wine, or 1 oz of hard liquor. General instructions  Keep a weight loss journal to keep track of the food you eat and how much you exercise you get.  Take over-the-counter and prescription medicines only as told by your health care provider.  Take vitamins and supplements only as told by your health care provider.  Consider joining a support group. Your health care provider may be able to recommend a support group.  Keep all follow-up visits as told by your health care provider. This is important. Contact a health care provider if:  You are unable to meet your weight loss goal after 6 weeks of dietary and lifestyle changes. This information is not intended to replace advice given to you by your health care provider. Make sure you discuss any questions you have with your health care provider. Document Released: 10/16/2004 Document Revised: 02/11/2016 Document Reviewed: 06/27/2015 Elsevier Interactive Patient Education  2019 [employer].

## 2018-11-02 NOTE — Progress Notes (Signed)
This very nice 84 y.o.  MWMpresents for 6 month follow up with HTN, HLD, Pre-Diabetes and Vitamin D Deficiency. Patient has GERD controlled on his meds. Patient is still c/o of sensation of Rt ear feeling stopped up & draining . Also, he's having some yellowish green sinus drainage.     Patient is treated for HTN (1985) & BP has been controlled at home. Today's BP is at goal - 124/76. Patient has had no complaints of any cardiac type chest pain, palpitations, dyspnea / orthopnea / PND, dizziness, claudication, or dependent edema.     Hyperlipidemia is controlled with diet & meds. Patient denies myalgias or other med SE's. Last Lipids were  Lab Results  Component Value Date   CHOL 151 07/29/2018   HDL 49 07/29/2018   LDLCALC 72 07/29/2018   TRIG 200 (H) 07/29/2018   CHOLHDL 3.1 07/29/2018      Also, the patient has Gluttony / Morbid Obesity (BMI 35+) and history of T2_NIDDM (1999)  W/CKD3 and has had no symptoms of reactive hypoglycemia, diabetic polys, paresthesias or visual blurring. Ke had last 55 # last year by July 2019 and unfortunately has gained 15# back.  Last A1c was not at goal consistent with his poor dietary compliance: Lab Results  Component Value Date   HGBA1C 8.1 (H) 07/29/2018      Further, the patient also has history of Vitamin D Deficiency ("32" / 2008)  and supplements vitamin D without any suspected side-effects. Last vitamin D was at goal: Lab Results  Component Value Date   VD25OH 79 04/20/2018   Current Outpatient Medications on File Prior to Visit  Medication Sig  . aspirin 81 MG tablet Take 1 tablet (81 mg total) by mouth daily. Stop and restart on 09/04/11  . Cinnamon 500 MG TABS Take 1,000 mg by mouth.  . doxazosin (CARDURA) 8 MG tablet TAKE 1 TABLET BY MOUTH ONCE DAILY FOR BLOOD PRESSURE, PROSTATE  . glipiZIDE (GLUCOTROL) 5 MG tablet TAKE 1/2 TO 1 TABLET BY MOUTH THREE TIMES DAILY WITH MEALS FOR DIABETES  . glucose blood (FREESTYLE TEST STRIPS) test  strip Check blood sugar 1 time daily-DX-E11.22.  Marland Kitchen glucose monitoring kit (FREESTYLE) monitoring kit check blood sugar 1 time daily-DX-E11.22.  . hydrochlorothiazide (HYDRODIURIL) 25 MG tablet TAKE 1 TABLET BY MOUTH EVERY DAY FOR BLOOD PRESSURE AND FLUID RETENTION  . Lancets (FREESTYLE) lancets Check blood sugar 1 time daily-DX-E11.22.  . lisinopril (PRINIVIL,ZESTRIL) 40 MG tablet TAKE 1 TABLET BY MOUTH EVERY DAY FOR BLOOD PRESSURE AND DIABETIC KIDNEY PROTECTION  . magnesium oxide (MAG-OX) 400 MG tablet Take 400 mg by mouth daily.  . metFORMIN (GLUCOPHAGE-XR) 500 MG 24 hr tablet TAKE 2 TABLETS BY MOUTH TWICE DAILY  . Multiple Vitamins-Minerals (MULTIVITAMIN WITH MINERALS) tablet Take 1 tablet by mouth daily.    Marland Kitchen neomycin-polymyxin b-dexamethasone (MAXITROL) 3.5-10000-0.1 SUSP 1 to 2 drops to the affected eye every 4 hours or 4 x /day  . Nutritional Supplements (VITAMIN D MAINTENANCE PO) Take 10,000 mg by mouth daily.    . pravastatin (PRAVACHOL) 40 MG tablet TAKE 1 TABLET BY MOUTH AT BEDTIME FOR CHOLESTEROL   No current facility-administered medications on file prior to visit.    No Known Allergies PMHx:   Past Medical History:  Diagnosis Date  . Arthritis   . BPH (benign prostatic hyperplasia)   . Cataract   . CKD (chronic kidney disease), stage III ([place])   . Depression    "usually a happy  go lucky"  . Diabetes mellitus   . Diverticulosis   . GERD (gastroesophageal reflux disease)    sometimes will take omeprazole  . Hyperlipidemia   . Hypertension    Immunization History  Administered Date(s) Administered  . DT 08/31/2015  . Influenza Whole 05/24/2011, 08/06/2012, 07/07/2013  . Influenza, High Dose Seasonal PF 06/21/2014, 08/31/2015, 06/03/2016, 07/15/2017, 07/29/2018  . Pneumococcal Conjugate-13 06/21/2014  . Pneumococcal Polysaccharide-23 03/10/2005   Past Surgical History:  Procedure Laterality Date  . CHOLECYSTECTOMY N/A 11/29/2015   Procedure: LAPAROSCOPIC  CHOLECYSTECTOMY WITH INTRAOPERATIVE CHOLANGIOGRAM;  Surgeon: Erroll Luna, MD;  Location: [facility] OR;  Service: General;  Laterality: N/A;  . COLONOSCOPY  2005, 08/21/11   2005 tubulovillous adenoma and adenomas 2012 - 3 adenomas, largest 10 mm, diverticulosis  . FRACTURE SURGERY     ? right foot as a child  . KNEE ARTHROSCOPY    . TONSILLECTOMY AND ADENOIDECTOMY    . VEIN LIGATION AND STRIPPING     left   FHx:    Reviewed / unchanged  SHx:    Reviewed / unchanged   Systems Review:  Constitutional: Denies fever, chills, wt changes, headaches, insomnia, fatigue, night sweats, change in appetite. Eyes: Denies redness, blurred vision, diplopia, discharge, itchy, watery eyes.  ENT: Denies discharge, congestion, post nasal drip, epistaxis, sore throat, hearing loss, dental pain, tinnitus, vertigo, snoring. Has Rt ear discomfort and sinus drainage as above. CV: Denies chest pain, palpitations, irregular heartbeat, syncope, dyspnea, diaphoresis, orthopnea, PND, claudication or edema. Respiratory: denies cough, dyspnea, DOE, pleurisy, hoarseness, laryngitis, wheezing.  Gastrointestinal: Denies dysphagia, odynophagia, heartburn, reflux, water brash, abdominal pain or cramps, nausea, vomiting, bloating, diarrhea, constipation, hematemesis, melena, hematochezia  or hemorrhoids. Genitourinary: Denies dysuria, frequency, urgency, nocturia, hesitancy, discharge, hematuria or flank pain. Musculoskeletal: Denies arthralgias, myalgias, stiffness, jt. swelling, pain, limping or strain/sprain.  Skin: Denies pruritus, rash, hives, warts, acne, eczema or change in skin lesion(s). Neuro: No weakness, tremor, incoordination, spasms, paresthesia or pain. Psychiatric: Denies confusion, memory loss or sensory loss. Endo: Denies change in weight, skin or hair change.  Heme/Lymph: No excessive bleeding, bruising or enlarged lymph nodes.  Physical Exam  BP 124/76   Pulse 93   Temp 98 F (36.7 C)   Ht '5\' 9"'$   (1.753 m)   Wt 239 lb 12.8 oz (108.8 kg)   SpO2 (!) 59%   BMI 35.41 kg/m   Appears  well nourished, well groomed  and in no distress.  Eyes: PERRLA, EOMs, conjunctiva no swelling or erythema. Sinuses: No frontal/maxillary tenderness ENT/Mouth: EAC's clear, TM's nl w/o erythema, bulging. Nares clear w/o erythema, swelling, exudates. Oropharynx clear without erythema or exudates. Oral hygiene is good. Tongue normal, non obstructing. Hearing intact.  Neck: Supple. Thyroid not palpable. Car 2+/2+ without bruits, nodes or JVD. Chest: Respirations nl with BS clear & equal w/o rales, rhonchi, wheezing or stridor.  Cor: Heart sounds normal w/ regular rate and rhythm without sig. murmurs, gallops, clicks or rubs. Peripheral pulses normal and equal  without edema.  Abdomen: Soft & bowel sounds normal. Non-tender w/o guarding, rebound, hernias, masses or organomegaly.  Lymphatics: Unremarkable.  Musculoskeletal: Full ROM all peripheral extremities, joint stability, 5/5 strength and normal gait.  Skin: Warm, dry without exposed rashes, lesions or ecchymosis apparent.  Neuro: Cranial nerves intact, reflexes equal bilaterally. Sensory-motor testing grossly intact. Tendon reflexes grossly intact.  Pysch: Alert & oriented x 3.  Insight and judgement nl & appropriate. No ideations.  Assessment and Plan:  1. Essential hypertension  -  Continue medication, monitor blood pressure at home.  - Continue DASH diet.  Reminder to go to the ER if any CP,  SOB, nausea, dizziness, severe HA, changes vision/speech.  - CBC with Differential/Platelet - COMPLETE METABOLIC PANEL WITH GFR - Magnesium - TSH  2. Hyperlipidemia, mixed  - Continue diet/meds, exercise,& lifestyle modifications.  - Continue monitor periodic cholesterol/liver & renal functions   - Lipid panel - TSH  3. Type 2 diabetes mellitus with stage 3 chronic kidney disease, without long-term current use of insulin (HCC)  - Hemoglobin A1c -  Insulin, random  4. Vitamin D deficiency  - Continue diet, exercise  - Lifestyle modifications.  - Monitor appropriate labs. - Continue supplementation.  - VITAMIN D 25 Hydroxyl  5. Gluttony  6. Class 2 severe obesity due to excess calories with serious comorbidity and body mass index (BMI) of 36.0 to 36.9 in adult ([place])  7. Medication management  - CBC with Differential/Platelet - COMPLETE METABOLIC PANEL WITH GFR - Magnesium - Lipid panel - TSH - Hemoglobin A1c - Insulin, random - VITAMIN D 25 Hydroxyl        Discussed  regular exercise, BP monitoring, weight control to achieve/maintain BMI less than 25 and discussed med and SE's. Recommended labs to assess and monitor clinical status with further disposition pending results of labs. Over 30 minutes of exam, counseling, chart review was performed. Plan ENT referral for chronic Rt ear complaints.

## 2018-11-03 ENCOUNTER — Encounter: Payer: Self-pay | Admitting: Internal Medicine

## 2018-11-03 ENCOUNTER — Ambulatory Visit (INDEPENDENT_AMBULATORY_CARE_PROVIDER_SITE_OTHER): Payer: Medicare Other | Admitting: Internal Medicine

## 2018-11-03 VITALS — BP 124/76 | HR 93 | Temp 98.0°F | Ht 69.0 in | Wt 239.8 lb

## 2018-11-03 DIAGNOSIS — N183 Chronic kidney disease, stage 3 (moderate): Secondary | ICD-10-CM

## 2018-11-03 DIAGNOSIS — Z6836 Body mass index (BMI) 36.0-36.9, adult: Secondary | ICD-10-CM

## 2018-11-03 DIAGNOSIS — I10 Essential (primary) hypertension: Secondary | ICD-10-CM

## 2018-11-03 DIAGNOSIS — E1122 Type 2 diabetes mellitus with diabetic chronic kidney disease: Secondary | ICD-10-CM

## 2018-11-03 DIAGNOSIS — E782 Mixed hyperlipidemia: Secondary | ICD-10-CM | POA: Diagnosis not present

## 2018-11-03 DIAGNOSIS — Z79899 Other long term (current) drug therapy: Secondary | ICD-10-CM

## 2018-11-03 DIAGNOSIS — E559 Vitamin D deficiency, unspecified: Secondary | ICD-10-CM

## 2018-11-03 DIAGNOSIS — H6521 Chronic serous otitis media, right ear: Secondary | ICD-10-CM

## 2018-11-03 DIAGNOSIS — R632 Polyphagia: Secondary | ICD-10-CM

## 2018-11-03 DIAGNOSIS — J0140 Acute pansinusitis, unspecified: Secondary | ICD-10-CM

## 2018-11-03 MED ORDER — AZITHROMYCIN 250 MG PO TABS: ORAL_TABLET | ORAL | 1 refills | Status: DC

## 2018-11-04 LAB — COMPLETE METABOLIC PANEL WITH GFR
AG Ratio: 1.8 (calc) (ref 1.0–2.5)
ALT: 17 U/L (ref 9–46)
AST: 16 U/L (ref 10–35)
Albumin: 4.3 g/dL (ref 3.6–5.1)
Alkaline phosphatase (APISO): 63 U/L (ref 35–144)
BUN/Creatinine Ratio: 18 (calc) (ref 6–22)
BUN: 33 mg/dL — ABNORMAL HIGH (ref 7–25)
CO2: 25 mmol/L (ref 20–32)
Calcium: 9.5 mg/dL (ref 8.6–10.3)
Chloride: 101 mmol/L (ref 98–110)
Creat: 1.79 mg/dL — ABNORMAL HIGH (ref 0.70–1.11)
GFR, Est African American: 40 mL/min/{1.73_m2} — ABNORMAL LOW (ref >=60)
GFR, Est Non African American: 35 mL/min/{1.73_m2} — ABNORMAL LOW (ref >=60)
Globulin: 2.4 g/dL (calc) (ref 1.9–3.7)
Glucose, Bld: 247 mg/dL — ABNORMAL HIGH (ref 65–99)
Potassium: 4.7 mmol/L (ref 3.5–5.3)
Sodium: 137 mmol/L (ref 135–146)
Total Bilirubin: 0.4 mg/dL (ref 0.2–1.2)
Total Protein: 6.7 g/dL (ref 6.1–8.1)

## 2018-11-04 LAB — CBC WITH DIFFERENTIAL/PLATELET
Absolute Monocytes: 777 cells/uL (ref 200–950)
Basophils Absolute: 49 cells/uL (ref 0–200)
Basophils Relative: 0.7 %
Eosinophils Absolute: 280 cells/uL (ref 15–500)
Eosinophils Relative: 4 %
HCT: 40.2 % (ref 38.5–50.0)
Hemoglobin: 13.6 g/dL (ref 13.2–17.1)
Lymphs Abs: 1512 cells/uL (ref 850–3900)
MCH: 29.8 pg (ref 27.0–33.0)
MCHC: 33.8 g/dL (ref 32.0–36.0)
MCV: 88 fL (ref 80.0–100.0)
MPV: 11 fL (ref 7.5–12.5)
Monocytes Relative: 11.1 %
Neutro Abs: 4382 cells/uL (ref 1500–7800)
Neutrophils Relative %: 62.6 %
Platelets: 184 10*3/uL (ref 140–400)
RBC: 4.57 10*6/uL (ref 4.20–5.80)
RDW: 13.1 % (ref 11.0–15.0)
Total Lymphocyte: 21.6 %
WBC: 7 10*3/uL (ref 3.8–10.8)

## 2018-11-04 LAB — TSH: TSH: 3.06 mIU/L (ref 0.40–4.50)

## 2018-11-04 LAB — MAGNESIUM: Magnesium: 1.9 mg/dL (ref 1.5–2.5)

## 2018-11-04 LAB — LIPID PANEL
Cholesterol: 154 mg/dL (ref <200)
HDL: 41 mg/dL (ref >=40)
LDL Cholesterol (Calc): 78 mg/dL (calc)
Non-HDL Cholesterol (Calc): 113 mg/dL (calc) (ref <130)
Total CHOL/HDL Ratio: 3.8 (calc) (ref <5.0)
Triglycerides: 264 mg/dL — ABNORMAL HIGH (ref <150)

## 2018-11-04 LAB — VITAMIN D 25 HYDROXY (VIT D DEFICIENCY, FRACTURES): Vit D, 25-Hydroxy: 68 ng/mL (ref 30–100)

## 2018-11-04 LAB — HEMOGLOBIN A1C
Hgb A1c MFr Bld: 9.6 % of total Hgb — ABNORMAL HIGH (ref <5.7)
Mean Plasma Glucose: 229 (calc)
eAG (mmol/L): 12.7 (calc)

## 2018-11-04 LAB — INSULIN, RANDOM: Insulin: 11.7 u[IU]/mL (ref 2.0–19.6)

## 2018-11-06 ENCOUNTER — Encounter: Payer: Self-pay | Admitting: Internal Medicine

## 2018-11-08 DIAGNOSIS — H5213 Myopia, bilateral: Secondary | ICD-10-CM | POA: Diagnosis not present

## 2018-11-08 DIAGNOSIS — E119 Type 2 diabetes mellitus without complications: Secondary | ICD-10-CM | POA: Diagnosis not present

## 2018-11-08 LAB — HM DIABETES EYE EXAM

## 2018-11-13 ENCOUNTER — Other Ambulatory Visit: Payer: Self-pay | Admitting: Physician Assistant

## 2018-11-30 DIAGNOSIS — J343 Hypertrophy of nasal turbinates: Secondary | ICD-10-CM | POA: Diagnosis not present

## 2018-11-30 DIAGNOSIS — H6983 Other specified disorders of Eustachian tube, bilateral: Secondary | ICD-10-CM | POA: Diagnosis not present

## 2018-11-30 DIAGNOSIS — H903 Sensorineural hearing loss, bilateral: Secondary | ICD-10-CM | POA: Diagnosis not present

## 2018-11-30 DIAGNOSIS — J342 Deviated nasal septum: Secondary | ICD-10-CM | POA: Diagnosis not present

## 2018-12-05 ENCOUNTER — Other Ambulatory Visit: Payer: Self-pay | Admitting: Internal Medicine

## 2019-01-31 NOTE — Progress Notes (Signed)
FOLLOW UP  Assessment and Plan:   Hypertension Well controlled with current medications  Monitor blood pressure at home; patient to call if consistently greater than 130/80 Continue DASH diet.   Reminder to go to the ER if any CP, SOB, nausea, dizziness, severe HA, changes vision/speech, left arm numbness and tingling and jaw pain.  Cholesterol Currently above goal; continue pravastatin, recommended omega 3 supplement, emphasized adherence to diet Continue low cholesterol diet and exercise.  Check lipid panel.   Diabetes with diabetic chronic kidney disease Continue medication: metformin, glipizide, cinnamon supplement  DEPENDING ON KIDNEY FUNCTION WILL LIKELY CUT BACK OR STOP METFOMIN START THE GLIPIZIDE WITH FOOD Continue diet and exercise.  Perform daily foot/skin check, notify office of any concerning changes.  Check A1C  Obesity with co morbidities Long discussion about weight loss, diet, and exercise Recommended diet heavy in fruits and veggies and low in animal meats, cheeses, and dairy products, appropriate calorie intake Discussed ideal weight for height  Continue with current portion control plan Will follow up in 3 months  Vitamin D Def Near goal at last visit; continue supplementation to maintain goal of 60-100 Defer Vit D level  Continue diet and meds as discussed. Further disposition pending results of labs. Discussed med's effects and SE's.   Over 30 minutes of exam, counseling, chart review, and critical decision making was performed.   Future Appointments  Date Time Provider [place]  05/13/2019  9:00 AM Unk Pinto, MD [facility] None  08/09/2019  9:00 AM Liane Comber, NP [facility] None    ----------------------------------------------------------------------------------------------------------------------  HPI 84 y.o. male  presents for 3 month follow up on hypertension, cholesterol, diabetes, obesity and vitamin D deficiency.    BMI is Body mass index is 36.48 kg/m., he has been working on diet and exercise. Wt Readings from Last 3 Encounters:  02/03/19 247 lb (112 kg)  11/03/18 239 lb 12.8 oz (108.8 kg)  07/29/18 237 lb (107.5 kg)   His blood pressure has been controlled at home, today their BP is BP: 126/82  He does workout. He denies chest pain, shortness of breath, dizziness.   He is on cholesterol medication (pravastatin 40 mg daily) and denies myalgias. His cholesterol is not at goal. The cholesterol last visit was:   Lab Results  Component Value Date   CHOL 154 11/03/2018   HDL 41 11/03/2018   LDLCALC 78 11/03/2018   TRIG 264 (H) 11/03/2018   CHOLHDL 3.8 11/03/2018    He has been working on diet and exercise for T2 diabetes CKD stage 3 P neuropathy Hyperlipidemia on pravastatin Metformin '500mg'$  XR 4 a day Off glipizide '5mg'$  x 6 months Meter: Freestyle States his sugars are running high Denies foot ulcerations, hyperglycemia, hypoglycemia , increased appetite, nausea, paresthesia of the feet, polydipsia, polyuria, visual disturbances, vomiting and weight loss. Last A1C in the office was:  Lab Results  Component Value Date   HGBA1C 9.6 (H) 11/03/2018   Lab Results  Component Value Date   GFRNONAA 35 (L) 11/03/2018   Patient is on Vitamin D supplement and was approaching goal of 60 at recent check:    Lab Results  Component Value Date   VD25OH 68 11/03/2018       Current Medications:  Current Outpatient Medications on File Prior to Visit  Medication Sig  . aspirin 81 MG tablet Take 1 tablet (81 mg total) by mouth daily. Stop and restart on 09/04/11  . Cinnamon 500 MG TABS Take 1,000 mg by mouth.  Marland Kitchen  doxazosin (CARDURA) 8 MG tablet TAKE 1 TABLET BY MOUTH ONCE DAILY FOR BLOOD PRESSURE, PROSTATE  . glipiZIDE (GLUCOTROL) 5 MG tablet TAKE 1/2 TO 1 TABLET BY MOUTH THREE TIMES DAILY WITH MEALS FOR DIABETES  . glucose blood (FREESTYLE TEST STRIPS) test strip Check blood sugar 1 time  daily-DX-E11.22.  Marland Kitchen glucose monitoring kit (FREESTYLE) monitoring kit check blood sugar 1 time daily-DX-E11.22.  . hydrochlorothiazide (HYDRODIURIL) 25 MG tablet Take 1 tablet Daily for BP & Fluid / Swelling  . Lancets (FREESTYLE) lancets Check blood sugar 1 time daily-DX-E11.22.  . lisinopril (PRINIVIL,ZESTRIL) 40 MG tablet TAKE 1 TABLET BY MOUTH EVERY DAY FOR BLOOD PRESSURE AND DIABETIC KIDNEY PROTECTION  . magnesium oxide (MAG-OX) 400 MG tablet Take 400 mg by mouth daily.  . metFORMIN (GLUCOPHAGE-XR) 500 MG 24 hr tablet Take 2 tablets 2 x /day with Meals for Diabetes  . Multiple Vitamins-Minerals (MULTIVITAMIN WITH MINERALS) tablet Take 1 tablet by mouth daily.    Marland Kitchen neomycin-polymyxin b-dexamethasone (MAXITROL) 3.5-10000-0.1 SUSP 1 to 2 drops to the affected eye every 4 hours or 4 x /day  . Nutritional Supplements (VITAMIN D MAINTENANCE PO) Take 10,000 mg by mouth daily.    . pravastatin (PRAVACHOL) 40 MG tablet TAKE 1 TABLET BY MOUTH AT BEDTIME FOR CHOLESTEROL   No current facility-administered medications on file prior to visit.      Allergies: No Known Allergies   Medical History:  Past Medical History:  Diagnosis Date  . Arthritis   . BPH (benign prostatic hyperplasia)   . Cataract   . CKD (chronic kidney disease), stage III ([place])   . Depression    "usually a happy go lucky"  . Diabetes mellitus   . Diverticulosis   . GERD (gastroesophageal reflux disease)    sometimes will take omeprazole  . Hyperlipidemia   . Hypertension    Family history- Reviewed and unchanged Social history- Reviewed and unchanged   Review of Systems:  Review of Systems  Constitutional: Negative for malaise/fatigue and weight loss.  HENT: Negative for hearing loss and tinnitus.   Eyes: Negative for blurred vision and double vision.  Respiratory: Negative for cough, shortness of breath and wheezing.   Cardiovascular: Negative for chest pain, palpitations, orthopnea, claudication and leg  swelling.  Gastrointestinal: Negative for abdominal pain, blood in stool, constipation, diarrhea, heartburn, melena, nausea and vomiting.  Genitourinary: Negative.   Musculoskeletal: Negative for joint pain and myalgias.  Skin: Negative for rash.  Neurological: Negative for dizziness, tingling, sensory change, weakness and headaches.  Endo/Heme/Allergies: Negative for polydipsia.  Psychiatric/Behavioral: Negative.   All other systems reviewed and are negative.     Physical Exam: BP 126/82   Pulse 63   Temp 98.4 F (36.9 C)   Ht '5\' 9"'$  (1.753 m)   Wt 247 lb (112 kg)   SpO2 96%   BMI 36.48 kg/m  Wt Readings from Last 3 Encounters:  02/03/19 247 lb (112 kg)  11/03/18 239 lb 12.8 oz (108.8 kg)  07/29/18 237 lb (107.5 kg)   General Appearance: Well nourished, in no apparent distress. Eyes: PERRLA, EOMs, conjunctiva no swelling or erythema Sinuses: No Frontal/maxillary tenderness ENT/Mouth: Ext aud canals clear, TMs without erythema, bulging. No erythema, swelling, or exudate on post pharynx.  Tonsils not swollen or erythematous. Hearing normal.  Neck: Supple, thyroid normal.  Respiratory: Respiratory effort normal, BS equal bilaterally without rales, rhonchi, wheezing or stridor.  Cardio: RRR with no MRGs. Brisk peripheral pulses without edema.  Abdomen: Soft, + BS.  Non tender, no guarding, rebound, hernias, masses. Lymphatics: Non tender without lymphadenopathy.  Musculoskeletal: Full ROM, 5/5 strength, Normal gait Skin: Warm, dry without rashes, lesions, ecchymosis.  Neuro: Cranial nerves intact. No cerebellar symptoms.  Psych: Awake and oriented X 3, normal affect, Insight and Judgment appropriate.    Vicie Mutters, PA-C 10:59 AM [facility]

## 2019-02-03 ENCOUNTER — Encounter: Payer: Self-pay | Admitting: Physician Assistant

## 2019-02-03 ENCOUNTER — Ambulatory Visit (INDEPENDENT_AMBULATORY_CARE_PROVIDER_SITE_OTHER): Payer: Medicare Other | Admitting: Physician Assistant

## 2019-02-03 ENCOUNTER — Other Ambulatory Visit: Payer: Self-pay

## 2019-02-03 VITALS — BP 126/82 | HR 63 | Temp 98.4°F | Ht 69.0 in | Wt 247.0 lb

## 2019-02-03 DIAGNOSIS — E1169 Type 2 diabetes mellitus with other specified complication: Secondary | ICD-10-CM | POA: Diagnosis not present

## 2019-02-03 DIAGNOSIS — E1142 Type 2 diabetes mellitus with diabetic polyneuropathy: Secondary | ICD-10-CM

## 2019-02-03 DIAGNOSIS — N183 Chronic kidney disease, stage 3 unspecified: Secondary | ICD-10-CM

## 2019-02-03 DIAGNOSIS — E782 Mixed hyperlipidemia: Secondary | ICD-10-CM

## 2019-02-03 DIAGNOSIS — E1122 Type 2 diabetes mellitus with diabetic chronic kidney disease: Secondary | ICD-10-CM

## 2019-02-03 DIAGNOSIS — E559 Vitamin D deficiency, unspecified: Secondary | ICD-10-CM

## 2019-02-03 DIAGNOSIS — E669 Obesity, unspecified: Secondary | ICD-10-CM

## 2019-02-03 DIAGNOSIS — Z79899 Other long term (current) drug therapy: Secondary | ICD-10-CM

## 2019-02-03 DIAGNOSIS — I10 Essential (primary) hypertension: Secondary | ICD-10-CM

## 2019-02-03 DIAGNOSIS — E785 Hyperlipidemia, unspecified: Secondary | ICD-10-CM

## 2019-02-03 DIAGNOSIS — Z6836 Body mass index (BMI) 36.0-36.9, adult: Secondary | ICD-10-CM

## 2019-02-03 MED ORDER — FAMOTIDINE 40 MG PO TABS: 40.0000 mg | ORAL_TABLET | Freq: Every evening | ORAL | 1 refills | Status: DC

## 2019-02-03 NOTE — Patient Instructions (Addendum)
Please be careful with glipizide (Glucotrol).  This medication forces your blood sugar down no matter what it is starting at.  ONLY TAKE THIS MEDICATION WITH FOOD Do not take this medication if your sugar is below 150  If at any time you start to have low blood sugars in the morning or during the day please stop this medication.  Please never take this medication if you are sick or can not eat.  A low blood sugar is much more dangerous than a high blood sugar. Your brain needs two things, sugar and oxygen.   What does your A1C results mean?  Your A1C is a measure of your sugar over the past 3 months   Use this chart as a guide to compare the results of your A1C blood test to your estimated average daily blood sugar:  A1C Range Average Sugar  4.0-6.0% 60-120 mg/dl  6.1-7.0% 121 - 150 mg/dl  7.1-8.0% 151-180 mg/dl  8.1-9.0% 181-210 mg/dl  10.1-11% 211-240 mg/dl  11.1-12.0% 271-300 mg/dl  12.1-13.0% 301-330 mg/dl  13.1-14.0% 331-360 mg/dl  Greater than 14.0% Greater than 360 mg/dl    RANGE OF A1C   Your A1C is a measure of your sugar over the past 3 months and is not affected by what you have eaten over the past few days. Diabetes increases your chances of stroke and heart attack over 300 % and is the leading cause of blindness and kidney failure in the [place]. Please make sure you decrease bad carbs like white bread, white rice, potatoes, corn, soft drinks, pasta, cereals, refined sugars, sweet tea, dried fruits, and fruit juice. Good carbs are okay to eat in moderation like sweet potatoes, brown rice, whole grain pasta/bread, most fruit (except dried fruit) and you can eat as many veggies as you want.   Greater than 6.5 is considered diabetic. Between 6.4 and 5.7 is prediabetic If your A1C is less than 5.7 you are NOT diabetic.  Targets for Glucose Readings: Time of Check Target for patients WITHOUT Diabetes Target for DIABETICS  Before Meals Less than 100  less than 150   Two hours after meals Less than 200  Less than 250    Get on famotidine at night and see if this helps If not we will send you to a GI doctor  Silent reflux: Not all heartburn burns...Marland KitchenMarland KitchenMarland Kitchen  What is LPR? Laryngopharyngeal reflux (LPR) or silent reflux is a condition in which acid that is made in the stomach travels up the esophagus (swallowing tube) and gets to the throat. Not everyone with reflux has a lot of heartburn or indigestion. In fact, many people with LPR never have heartburn. This is why LPR is called SILENT REFLUX, and the terms "Silent reflux" and "LPR" are often used interchangeably. Because LPR is silent, it is sometimes difficult to diagnose.  How can you tell if you have LPR?  Marland Kitchen Chronic hoarseness- Some people have hoarseness that comes and goes . throat clearing  . Cough . It can cause shortness of breath and cause asthma like symptoms. Marland Kitchen a feeling of a lump in the throat  . difficulty swallowing . a problem with too much nose and throat drainage.  . Some people will feel their esophagus spasm which feels like their heart beating hard and fast, this will usually be after a meal, at rest, or lying down at night.    How do I treat this? Treatment for LPR should be individualized, and your doctor will suggest the  best treatment for you. Generally there are several treatments for LPR: . changing habits and diet to reduce reflux,  . medications to reduce stomach acid, and  . surgery to prevent reflux. Most people with LPR need to modify how and when they eat, as well as take some medication, to get well. Sometimes, nonprescription liquid antacids, such as Maalox, Gelucil and Mylanta are recommended. When used, these antacids should be taken four times each day - one tablespoon one hour after each meal and before bedtime. Dietary and lifestyle changes alone are not often enough to control LPR - medications that reduce stomach acid are also usually needed. These must be  prescribed by our doctor.   TIPS FOR REDUCING REFLUX AND LPR Control your LIFE-STYLE and your DIET! Marland Kitchen If you use tobacco, QUIT.  Marland Kitchen Smoking makes you reflux. After every cigarette you have some LPR.  . Don't wear clothing that is too tight, especially around the waist (trousers, corsets, belts).  . Do not lie down just after eating...in fact, do not eat within three hours of bedtime.  . You should be on a low-fat diet.  . Limit your intake of red meat.  . Limit your intake of butter.  Marland Kitchen Avoid fried foods.  . Avoid chocolate  . Avoid cheese.  Marland Kitchen Avoid eggs. Marland Kitchen Specifically avoid caffeine (especially coffee and tea), soda pop (especially cola) and mints.  . Avoid alcoholic beverages, particularly in the evening.

## 2019-02-04 LAB — COMPLETE METABOLIC PANEL WITH GFR
AG Ratio: 1.9 (calc) (ref 1.0–2.5)
ALT: 13 U/L (ref 9–46)
AST: 15 U/L (ref 10–35)
Albumin: 4.1 g/dL (ref 3.6–5.1)
Alkaline phosphatase (APISO): 53 U/L (ref 35–144)
BUN/Creatinine Ratio: 15 (calc) (ref 6–22)
BUN: 25 mg/dL (ref 7–25)
CO2: 27 mmol/L (ref 20–32)
Calcium: 9.4 mg/dL (ref 8.6–10.3)
Chloride: 102 mmol/L (ref 98–110)
Creat: 1.71 mg/dL — ABNORMAL HIGH (ref 0.70–1.11)
GFR, Est African American: 43 mL/min/{1.73_m2} — ABNORMAL LOW (ref >=60)
GFR, Est Non African American: 37 mL/min/{1.73_m2} — ABNORMAL LOW (ref >=60)
Globulin: 2.2 g/dL (calc) (ref 1.9–3.7)
Glucose, Bld: 170 mg/dL — ABNORMAL HIGH (ref 65–99)
Potassium: 4.5 mmol/L (ref 3.5–5.3)
Sodium: 140 mmol/L (ref 135–146)
Total Bilirubin: 0.5 mg/dL (ref 0.2–1.2)
Total Protein: 6.3 g/dL (ref 6.1–8.1)

## 2019-02-04 LAB — TSH: TSH: 1.99 mIU/L (ref 0.40–4.50)

## 2019-02-04 LAB — HEMOGLOBIN A1C
Hgb A1c MFr Bld: 9.2 % of total Hgb — ABNORMAL HIGH (ref <5.7)
Mean Plasma Glucose: 217 (calc)
eAG (mmol/L): 12 (calc)

## 2019-02-04 LAB — CBC WITH DIFFERENTIAL/PLATELET
Absolute Monocytes: 938 cells/uL (ref 200–950)
Basophils Absolute: 58 cells/uL (ref 0–200)
Basophils Relative: 0.7 %
Eosinophils Absolute: 307 cells/uL (ref 15–500)
Eosinophils Relative: 3.7 %
HCT: 40.9 % (ref 38.5–50.0)
Hemoglobin: 13.6 g/dL (ref 13.2–17.1)
Lymphs Abs: 1569 cells/uL (ref 850–3900)
MCH: 29.4 pg (ref 27.0–33.0)
MCHC: 33.3 g/dL (ref 32.0–36.0)
MCV: 88.5 fL (ref 80.0–100.0)
MPV: 11.4 fL (ref 7.5–12.5)
Monocytes Relative: 11.3 %
Neutro Abs: 5428 cells/uL (ref 1500–7800)
Neutrophils Relative %: 65.4 %
Platelets: 175 10*3/uL (ref 140–400)
RBC: 4.62 10*6/uL (ref 4.20–5.80)
RDW: 12.8 % (ref 11.0–15.0)
Total Lymphocyte: 18.9 %
WBC: 8.3 10*3/uL (ref 3.8–10.8)

## 2019-02-04 LAB — LIPID PANEL
Cholesterol: 149 mg/dL (ref <200)
HDL: 41 mg/dL (ref >=40)
LDL Cholesterol (Calc): 76 mg/dL (calc)
Non-HDL Cholesterol (Calc): 108 mg/dL (calc) (ref <130)
Total CHOL/HDL Ratio: 3.6 (calc) (ref <5.0)
Triglycerides: 234 mg/dL — ABNORMAL HIGH (ref <150)

## 2019-02-08 ENCOUNTER — Other Ambulatory Visit: Payer: Self-pay

## 2019-02-08 DIAGNOSIS — E119 Type 2 diabetes mellitus without complications: Secondary | ICD-10-CM

## 2019-02-08 MED ORDER — GLIPIZIDE 5 MG PO TABS: ORAL_TABLET | ORAL | 0 refills | Status: DC

## 2019-02-10 DIAGNOSIS — H25013 Cortical age-related cataract, bilateral: Secondary | ICD-10-CM | POA: Diagnosis not present

## 2019-02-10 DIAGNOSIS — H25043 Posterior subcapsular polar age-related cataract, bilateral: Secondary | ICD-10-CM | POA: Diagnosis not present

## 2019-02-10 DIAGNOSIS — H18413 Arcus senilis, bilateral: Secondary | ICD-10-CM | POA: Diagnosis not present

## 2019-02-10 DIAGNOSIS — H2513 Age-related nuclear cataract, bilateral: Secondary | ICD-10-CM | POA: Diagnosis not present

## 2019-02-16 DIAGNOSIS — H5202 Hypermetropia, left eye: Secondary | ICD-10-CM | POA: Diagnosis not present

## 2019-02-16 DIAGNOSIS — Z961 Presence of intraocular lens: Secondary | ICD-10-CM | POA: Diagnosis not present

## 2019-02-16 DIAGNOSIS — H52222 Regular astigmatism, left eye: Secondary | ICD-10-CM | POA: Diagnosis not present

## 2019-02-16 DIAGNOSIS — H2512 Age-related nuclear cataract, left eye: Secondary | ICD-10-CM | POA: Diagnosis not present

## 2019-02-17 DIAGNOSIS — H2512 Age-related nuclear cataract, left eye: Secondary | ICD-10-CM | POA: Diagnosis not present

## 2019-02-17 DIAGNOSIS — H2511 Age-related nuclear cataract, right eye: Secondary | ICD-10-CM | POA: Diagnosis not present

## 2019-02-23 ENCOUNTER — Other Ambulatory Visit: Payer: Self-pay | Admitting: Internal Medicine

## 2019-03-01 ENCOUNTER — Other Ambulatory Visit: Payer: Self-pay | Admitting: Internal Medicine

## 2019-03-02 DIAGNOSIS — H52222 Regular astigmatism, left eye: Secondary | ICD-10-CM | POA: Diagnosis not present

## 2019-03-02 DIAGNOSIS — H5203 Hypermetropia, bilateral: Secondary | ICD-10-CM | POA: Diagnosis not present

## 2019-03-02 DIAGNOSIS — H2511 Age-related nuclear cataract, right eye: Secondary | ICD-10-CM | POA: Diagnosis not present

## 2019-03-02 DIAGNOSIS — Z961 Presence of intraocular lens: Secondary | ICD-10-CM | POA: Diagnosis not present

## 2019-03-03 ENCOUNTER — Other Ambulatory Visit: Payer: Self-pay | Admitting: *Deleted

## 2019-03-03 ENCOUNTER — Other Ambulatory Visit: Payer: Self-pay | Admitting: Internal Medicine

## 2019-03-03 MED ORDER — FREESTYLE INSULINX TEST VI STRP: ORAL_STRIP | 0 refills | Status: DC

## 2019-03-07 ENCOUNTER — Other Ambulatory Visit: Payer: Self-pay | Admitting: Internal Medicine

## 2019-03-07 ENCOUNTER — Other Ambulatory Visit: Payer: Self-pay | Admitting: *Deleted

## 2019-03-07 MED ORDER — FREESTYLE INSULINX TEST VI STRP: ORAL_STRIP | 3 refills | Status: DC

## 2019-03-07 MED ORDER — FREESTYLE INSULINX TEST VI STRP: ORAL_STRIP | 0 refills | Status: DC

## 2019-03-23 DIAGNOSIS — Z961 Presence of intraocular lens: Secondary | ICD-10-CM | POA: Diagnosis not present

## 2019-05-13 ENCOUNTER — Encounter: Payer: Self-pay | Admitting: Internal Medicine

## 2019-06-17 ENCOUNTER — Other Ambulatory Visit: Payer: Self-pay | Admitting: Physician Assistant

## 2019-06-17 DIAGNOSIS — E119 Type 2 diabetes mellitus without complications: Secondary | ICD-10-CM

## 2019-06-21 ENCOUNTER — Ambulatory Visit (INDEPENDENT_AMBULATORY_CARE_PROVIDER_SITE_OTHER): Payer: Medicare Other | Admitting: Adult Health Nurse Practitioner

## 2019-06-21 ENCOUNTER — Other Ambulatory Visit: Payer: Self-pay

## 2019-06-21 ENCOUNTER — Encounter: Payer: Self-pay | Admitting: Adult Health Nurse Practitioner

## 2019-06-21 VITALS — BP 142/78 | HR 87 | Temp 97.7°F | Ht 69.0 in | Wt 251.0 lb

## 2019-06-21 DIAGNOSIS — Z125 Encounter for screening for malignant neoplasm of prostate: Secondary | ICD-10-CM

## 2019-06-21 DIAGNOSIS — E559 Vitamin D deficiency, unspecified: Secondary | ICD-10-CM

## 2019-06-21 DIAGNOSIS — E785 Hyperlipidemia, unspecified: Secondary | ICD-10-CM

## 2019-06-21 DIAGNOSIS — E1122 Type 2 diabetes mellitus with diabetic chronic kidney disease: Secondary | ICD-10-CM | POA: Diagnosis not present

## 2019-06-21 DIAGNOSIS — Z136 Encounter for screening for cardiovascular disorders: Secondary | ICD-10-CM

## 2019-06-21 DIAGNOSIS — Z0000 Encounter for general adult medical examination without abnormal findings: Secondary | ICD-10-CM | POA: Diagnosis not present

## 2019-06-21 DIAGNOSIS — N401 Enlarged prostate with lower urinary tract symptoms: Secondary | ICD-10-CM

## 2019-06-21 DIAGNOSIS — N1832 Chronic kidney disease, stage 3b: Secondary | ICD-10-CM

## 2019-06-21 DIAGNOSIS — E1169 Type 2 diabetes mellitus with other specified complication: Secondary | ICD-10-CM | POA: Diagnosis not present

## 2019-06-21 DIAGNOSIS — I10 Essential (primary) hypertension: Secondary | ICD-10-CM

## 2019-06-21 DIAGNOSIS — Z79899 Other long term (current) drug therapy: Secondary | ICD-10-CM

## 2019-06-21 DIAGNOSIS — N183 Chronic kidney disease, stage 3 unspecified: Secondary | ICD-10-CM

## 2019-06-21 DIAGNOSIS — E1142 Type 2 diabetes mellitus with diabetic polyneuropathy: Secondary | ICD-10-CM

## 2019-06-21 DIAGNOSIS — N138 Other obstructive and reflux uropathy: Secondary | ICD-10-CM

## 2019-06-21 NOTE — Progress Notes (Signed)
Complete Physical  Assessment and Plan:    Breeze was seen today for annual exam.  Diagnoses and all orders for this visit:  Encounter for annual physical exam Yearly   Essential hypertension Hypertension Continue medication: LISINOPRIL 40MG DAILY Monitor blood pressure at home; call if consistently over 130/80 Continue DASH diet.   Reminder to go to the ER if any CP, SOB, nausea, dizziness, severe HA, changes vision/speech, left arm numbness and tingling and jaw pain. -     CBC with Differential/Platelet -     COMPLETE METABOLIC PANEL WITH GFR  Hyperlipidemia associated with type 2 diabetes mellitus (HCC) Continue medications: PRAVACHOL 40MG DAILY Discussed dietary and exercise modifications Low fat diet -     Lipid panel   Type 2 diabetes mellitus with stage 3 chronic kidney disease, without long-term current use of insulin (HCC) Continue medications:Glipizide TID with food.  Try to get in middle of the day dose. Discussed insulin / injectable treatment at length as A1c has been increasing Discussed weight loss and diet at length and he reports he will lose weight before starting insulin.  Discussed general issues about diabetes pathophysiology and management. Education: Reviewed 'ABCs' of diabetes management (respective goals in parentheses):  A1C (<7), blood pressure (<130/80), and cholesterol (LDL <70) Dietary recommendations Encouraged aerobic exercise.  Discussed foot care, check daily Yearly retinal exam Dental exam every 6 months Monitor blood glucose, discussed goal for patient -     Hemoglobin A1c  Diabetic sensory polyneuropathy (HCC) Discussed checking feet daily Monitor carbohydrate & sugar intake Reduce sugars & glucose readings  Stage 3 chronic kidney disease due to type 2 diabetes mellitus (HCC) Continue glucose monitoring Continue medications Increase fluids Avoid NSAIDS Discussed dietary modifications and exercise Will continue to  monitor  Vitamin D deficiency Continue supplementation -     VITAMIN D 25 Hydroxy (Vit-D Deficiency)  Severe Obesity (BMI 35.0-39.9) with comorbidity ([place]) Discussed diet and exercise at length Discussed weight goal for patient approx 160.  Obtainable goal reduce by 10lbs in three months. Discussed small lifestyle changes and increase walking  BPH with lower urinary tract symptoms Nocturia, no change twice a night Continue Doxazosin 19m Continue to monitor  Screening for cardiovascular condition -     EKG 12-Lead  Screening PSA (prostate specific antigen) -     PSA  Medication management Continued   Continue diet and meds as discussed. Further disposition pending results of labs. Discussed med's effects and SE's.   Over 30 minutes of exam, counseling, chart review, and critical decision making was performed.   Future Appointments  Date Time Provider [place] 06/21/2019 10:00 AM MGarnet Sierras NP [facility] None  08/15/2019 10:00 AM CLiane Comber NP [facility] None  06/21/2020 10:00 AM MGarnet Sierras NP [facility] None    ----------------------------------------------------------------------------------------------------------------------  HPI 84y.o. male  presents for 3 month follow up on HTN, HLD, DMII with CKD, obesity and vitamin D deficiency.   BMI is There is no height or weight on file to calculate BMI., he has been working on diet and exercise. Wt Readings from Last 3 Encounters:  02/03/19 247 lb (112 kg)  11/03/18 239 lb 12.8 oz (108.8 kg)  07/29/18 237 lb (107.5 kg)   HTN predates 1985. His blood pressure has been controlled at home, today their BP is    He does not workout although he stay busy with work around his home and outside. He denies chest pain, shortness of breath, dizziness.   He is  on cholesterol medication (pravastatin 40 mg daily) and denies myalgias. His cholesterol is not at goal. The cholesterol last visit was:   Lab  Results  Component Value Date   CHOL 149 02/03/2019   HDL 41 02/03/2019   LDLCALC 76 02/03/2019   TRIG 234 (H) 02/03/2019   CHOLHDL 3.6 02/03/2019    He has not been working on diet and exercise for DMII and has CKD stage 3 P neuropathy Hyperlipidemia on pravastatin Metformin 586m XR 4 a day Off glipizide 528mx 6 months Meter: Freestyle States his sugars are running high Denies foot ulcerations, hyperglycemia, hypoglycemia , increased appetite, nausea, paresthesia of the feet, polydipsia, polyuria, visual disturbances, vomiting and weight loss. Last A1C in the office was:  Lab Results  Component Value Date   HGBA1C 9.2 (H) 02/03/2019   Lab Results  Component Value Date   GFRNONAA 3746L) 02/03/2019   Patient is on Vitamin D supplement for defciency (32, 2008) and was approaching goal of 70 at recent check:    Lab Results  Component Value Date   VD25OH 68 11/03/2018      BPH Currently he is symptomatic and endorses nocturia x 2 a night and denies urinary hesitancy, urinary frequency, incomplete voiding, double voiding, weak stream, perineal discomfort, dysuria, hematuria, abdominal pain, flank pain and testicular pain.  He is taking Doxazosin (Cardura). He is not following with urology at this time and last results were in normal. Lab Results  Component Value Date   PSA 1.5 06/21/2019     Current Medications:  Current Outpatient Medications on File Prior to Visit  Medication Sig  . aspirin 81 MG tablet Take 1 tablet (81 mg total) by mouth daily. Stop and restart on 09/04/11  . Cinnamon 500 MG TABS Take 1,000 mg by mouth.  . doxazosin (CARDURA) 8 MG tablet TAKE 1 TABLET BY MOUTH ONCE DAILY FOR BLOOD PRESSURE, PROSTATE  . famotidine (PEPCID) 40 MG tablet Take 1 tablet (40 mg total) by mouth every evening.  . Marland KitchenlipiZIDE (GLUCOTROL) 5 MG tablet Take 1 tablet 3 x /day with Meals for Diabetes  . glucose blood (FREESTYLE INSULINX TEST) test strip USE 1 STRIP TO CHECK GLUCOSE  ONCE DAILY DX-E11.9  . glucose monitoring kit (FREESTYLE) monitoring kit check blood sugar 1 time daily-DX-E11.22.  . hydrochlorothiazide (HYDRODIURIL) 25 MG tablet Take 1 tablet Daily for BP & Fluid / Swelling  . Lancets (FREESTYLE) lancets Check blood sugar 1 time daily-DX-E11.22.  . lisinopril (ZESTRIL) 40 MG tablet Take 1 tablet Daily for BP & Kidney Protection  . magnesium oxide (MAG-OX) 400 MG tablet Take 400 mg by mouth daily.  . metFORMIN (GLUCOPHAGE-XR) 500 MG 24 hr tablet Take 2 tablets 2 x /day with Meals for Diabetes  . Multiple Vitamins-Minerals (MULTIVITAMIN WITH MINERALS) tablet Take 1 tablet by mouth daily.    . Marland Kitcheneomycin-polymyxin b-dexamethasone (MAXITROL) 3.5-10000-0.1 SUSP 1 to 2 drops to the affected eye every 4 hours or 4 x /day  . Nutritional Supplements (VITAMIN D MAINTENANCE PO) Take 10,000 mg by mouth daily.    . pravastatin (PRAVACHOL) 40 MG tablet TAKE 1 TABLET BY MOUTH AT BEDTIME FOR CHOLESTEROL   No current facility-administered medications on file prior to visit.      Allergies: No Known Allergies   Medical History:  Past Medical History:  Diagnosis Date  . Arthritis   . BPH (benign prostatic hyperplasia)   . Cataract   . CKD (chronic [place] disease), stage III [place]  .  Depression    "usually a happy go lucky"  . Diabetes mellitus   . Diverticulosis   . GERD (gastroesophageal reflux disease)    sometimes will take omeprazole  . Hyperlipidemia   . Hypertension    Family history- Reviewed and unchanged Social history- Reviewed and unchanged   Preventative care: Last colonoscopy: 2012 due 2015 but will not have another, declines, also won't do hemoccult  Prior vaccinations: TD or Tdap: 2016 Influenza: 2018, 2019 Pneumococcal: 2006 Prevnar13: 2015 Shingles/Zostavax: declines  Eye exam: last in 2016 - will refer due to poor follow through  Dentist: last in 06/2017, follow up in 07/2017 for cavity care, has full upper partials.   Review of  Systems:  Review of Systems  Constitutional: Negative for malaise/fatigue and weight loss.  HENT: Negative for hearing loss and tinnitus.   Eyes: Negative for blurred vision and double vision.  Respiratory: Negative for cough, shortness of breath and wheezing.   Cardiovascular: Negative for chest pain, palpitations, orthopnea, claudication and leg swelling.  Gastrointestinal: Negative for abdominal pain, blood in stool, constipation, diarrhea, heartburn, melena, nausea and vomiting.  Genitourinary: Negative.   Musculoskeletal: Negative for joint pain and myalgias.  Skin: Negative for rash.  Neurological: Negative for dizziness, tingling, sensory change, weakness and headaches.  Endo/Heme/Allergies: Negative for polydipsia.  Psychiatric/Behavioral: Negative.   All other systems reviewed and are negative.     Physical Exam: There were no vitals taken for this visit. Wt Readings from Last 3 Encounters:  02/03/19 247 lb (112 kg)  11/03/18 239 lb 12.8 oz (108.8 kg)  07/29/18 237 lb (107.5 kg)   General Appearance: Well nourished, in no apparent distress. Eyes: PERRLA, EOMs, conjunctiva no swelling or erythema Sinuses: No Frontal/maxillary tenderness ENT/Mouth: Ext aud canals clear, TMs without erythema, bulging. No erythema, swelling, or exudate on post pharynx.  Tonsils not swollen or erythematous. Hearing normal.  Neck: Supple, thyroid normal.  Respiratory: Respiratory effort normal, BS equal bilaterally without rales, rhonchi, wheezing or stridor.  Cardio: RRR with no MRGs. Brisk peripheral pulses without edema.  Abdomen: Soft, + BS.  Non tender, no guarding, rebound, hernias, masses. Lymphatics: Non tender without lymphadenopathy.  Musculoskeletal: Full ROM, 5/5 strength, Normal gait Skin: Warm, dry without rashes, lesions, ecchymosis.  Neuro: Cranial nerves intact. No cerebellar symptoms.  Psych: Awake and oriented X 3, normal affect, Insight and Judgment appropriate.   EKG:  RBBB, NSR    Garnet Sierras, NP 12:10 PM [facility]

## 2019-06-21 NOTE — Patient Instructions (Addendum)
We are out of influenza high dose vaccination today.  You can call for a nurse visit or local pharmacy.  You pneumonia vaccinations are up to date.  Get compression stockings or socks.  Put them on in the morning and take them off at night.  Increase water intake to help and improve kidney function.  At least 60oz of water a day.  Increase your activity to help with weight loss.     Here are some simple exercises I like you can do from a chair.  This is great if you are not able to get outdoors or restricted by your breathing.  Step-By-Step Chair Exercises  To maximize your chair exercise routine, you'll need a set of small dumbbells weighing anywhere from 5 to 8 pounds each, as well as a resistance band. Start your routine by doing a couple of exercises, then gradually add more to your routine. Repeat each movement 3 to 10 times.     1. Sitting Side Tilt  This is a great exercise to begin with because it warms up the body while improving core strength. Sit up straight in your chair and tilt your upper body as far as you can to the left, return to center, then repeat the exercise on your right.        2. Back Archer  The Back Fernande Boyden will help to improve posture and open up your chest. Clasp your hands behind your back. Arch your back and push your hands away from you. Hold for 1-3 seconds, then release your hands and relax your back. Repeat.     3. Shoulder Arcs  This exercise strengthens the shoulders and works the rotator cuffs, which tend to be a problem area for older adults as those muscles are used less often. Hold a weight in each hand, palms facing in. Move your arms forward and up to shoulder level, then lower. You can also try moving your arms out to the side and raising them to shoulder height, then lowering them. As you build strength, raise your arms as high as possible over your head before lowering them. Repeat.      4. Toe Up  The Toe Up strengthens your  calves. Press toes into the floor to raise your knees, then lower and press heels into the floor. Repeat.       5. Sitting Quad Set  If you spend a significant amount of time in the sitting position, it is not uncommon to see muscle mass loss in your legs. To prevent muscle loss, the Sitting Quad Set will help keep your muscles strong and active. Raise your left foot to straighten your leg, tightening the muscle in your thigh. Hold for 10-30 seconds, then lower. Repeat with your right leg.     6. Chair Push-up  Chair Push-ups can greatly improve stability and strengthen your core. Though this exercise is slightly more advanced, you can work up to it with time. Grip the arms of the chair and press down to push your buttocks up. Lower yourself back into the chair. Repeat.     7. Side Leg Kick  For improved balance and strengthening your legs, try the Side Leg Kick exercise. Stand up, holding the back of the chair for balance, and raise your left leg out to the side. Lower your left leg, then repeat with your right leg.    8. Leg Swing  The Leg Swing is another exercise that helps improve your balance and strengthen  the legs. Stand behind your chair, holding the top for balance, and lift your left leg out in front of you. Swing your left leg out behind you, then bring back to the middle. Repeat with your right leg.     9. Ankle Bend  This exercise primarily works to improve your balance. Holding your chair for support, press your left heel into the floor and gently bend your ankle and raise your toes, keeping your body straight throughout the exercise. Lower your toes and repeat with right leg.    If you are just getting started with chair exercises, avoid injury by having another person there for support. They can help make sure you are using good form for each exercise. Listen to your body and start out slowly in the beginning.  Do what you can, start with two and slowly add exercise  every 3-4 days.  This will help to work your muscles to increase stability and bone health.   Vit D  & Vit C 1,000 mg   are recommended to help protect  against the Covid-19 and other Corona viruses.    Also it's recommended  to take  Zinc 50 mg  to help  protect against the Covid-19   and best place to get  is also on [employer].com  and don't pay more than 6-8 cents /pill !  ================================ Coronavirus (COVID-19) Are you at risk?  Are you at risk for the Coronavirus (COVID-19)?  To be considered HIGH RISK for Coronavirus (COVID-19), you have to meet the following criteria:  . Traveled to [place], [place], [place], [place] or [place]; or in the [place] to [place], [place], [place]  . or [place]; and have fever, cough, and shortness of breath within the last 2 weeks of travel OR . Been in close contact with a person diagnosed with COVID-19 within the last 2 weeks and have  . fever, cough,and shortness of breath .  . IF YOU DO NOT MEET THESE CRITERIA, YOU ARE CONSIDERED LOW RISK FOR COVID-19.  What to do if you are HIGH RISK for COVID-19?  Marland Kitchen If you are having a medical emergency, call 911. . Seek medical care right away. Before you go to a doctor's office, urgent care or emergency department, .  call ahead and tell them about your recent travel, contact with someone diagnosed with COVID-19  .  and your symptoms.  . You should receive instructions from your physician's office regarding next steps of care.  . When you arrive at healthcare provider, tell the healthcare staff immediately you have returned from  . visiting [place], [place], [place], [place] or [place]; or traveled in the [place] to [place], [place],  . [place] or [place] in the last two weeks or you have been in close contact with a person diagnosed with  . COVID-19 in the last 2 weeks.   . Tell the health care staff about your symptoms: fever, cough and shortness of  breath. . After you have been seen by a medical provider, you will be either: o Tested for (COVID-19) and discharged home on quarantine except to seek medical care if  o symptoms worsen, and asked to  - Stay home and avoid contact with others until you get your results (4-5 days)  - Avoid travel on public transportation if possible (such as bus, train, or airplane) or o Sent to the Emergency Department by EMS for evaluation, COVID-19 testing  and  o possible admission depending on your condition and test results.  What to do if you are LOW RISK for COVID-19?  Reduce your risk of any infection by using the same precautions used for avoiding the common cold or flu:  Marland Kitchen Wash your hands often with soap and warm water for at least 20 seconds.  If soap and water are not readily available,  . use an alcohol-based hand sanitizer with at least 60% alcohol.  . If coughing or sneezing, cover your mouth and nose by coughing or sneezing into the elbow areas of your shirt or coat, .  into a tissue or into your sleeve (not your hands). . Avoid shaking hands with others and consider head nods or verbal greetings only. . Avoid touching your eyes, nose, or mouth with unwashed hands.  . Avoid close contact with people who are sick. . Avoid places or events with large numbers of people in one location, like concerts or sporting events. . Carefully consider travel plans you have or are making. . If you are planning any travel outside or inside the [place], visit the CDC's Travelers' Health webpage for the latest health notices. . If you have some symptoms but not all symptoms, continue to monitor at home and seek medical attention  . if your symptoms worsen. . If you are having a medical emergency, call 911.   . >>>>>>>>>>>>>>>>>>>>>>>>>>>>>>>>> . We Do NOT Approve of  [facility], [employer]  Our Patients  To Do Home Visits & We Do NOT Approve of [employer] > > > > > > > > > > > > >  > > > > > > > > > > > > > > > > > > > > > > > > > >  Preventive Care for Adults  A healthy lifestyle and preventive care can promote health and wellness. Preventive health guidelines for women include the following key practices.  A routine yearly physical is a good way to check with your health care provider about your health and preventive screening. It is a chance to share any concerns and updates on your health and to receive a thorough exam.  Visit your dentist for a routine exam and preventive care every 6 months. Brush your teeth twice a day and floss once a day. Good oral hygiene prevents tooth decay and gum disease.  The frequency of eye exams is based on your age, health, family medical history, use of contact lenses, and other factors. Follow your health care provider's recommendations for frequency of eye exams.  Eat a healthy diet. Foods like vegetables, fruits, whole grains, low-fat dairy products, and lean protein foods contain the nutrients you need without too many calories. Decrease your intake of foods high in solid fats, added sugars, and salt. Eat the right amount of calories for you. Get information about a proper diet from your health care provider, if necessary.  Regular physical exercise is one of the most important things you can do for your health. Most adults should get at least 150 minutes of moderate-intensity exercise (any activity that increases your heart rate and causes you to sweat) each week. In addition, most adults need muscle-strengthening exercises on 2 or more days a week.  Maintain a healthy weight. The body mass index (BMI) is a screening tool to identify possible weight problems. It provides an estimate of body fat based on height and weight. Your health care provider can find your BMI and can help you  achieve or maintain a healthy weight. For adults 20 years and older:  A BMI below 18.5 is considered underweight.  A BMI of 18.5 to 24.9 is normal.  A  BMI of 25 to 29.9 is considered overweight.  A BMI of 30 and above is considered obese.  Maintain normal blood lipids and cholesterol levels by exercising and minimizing your intake of saturated fat. Eat a balanced diet with plenty of fruit and vegetables. If your lipid or cholesterol levels are high, you are over 50, or you are at high risk for heart disease, you may need your cholesterol levels checked more frequently. Ongoing high lipid and cholesterol levels should be treated with medicines if diet and exercise are not working.  If you smoke, find out from your health care provider how to quit. If you do not use tobacco, do not start.  Lung cancer screening is recommended for adults aged 30-80 years who are at high risk for developing lung cancer because of a history of smoking. A yearly low-dose CT scan of the lungs is recommended for people who have at least a 30-pack-year history of smoking and are a current smoker or have quit within the past 15 years. A pack year of smoking is smoking an average of 1 pack of cigarettes a day for 1 year (for example: 1 pack a day for 30 years or 2 packs a day for 15 years). Yearly screening should continue until the smoker has stopped smoking for at least 15 years. Yearly screening should be stopped for people who develop a health problem that would prevent them from having lung cancer treatment.  Avoid use of street drugs. Do not share needles with anyone. Ask for help if you need support or instructions about stopping the use of drugs.  High blood pressure causes heart disease and increases the risk of stroke.  Ongoing high blood pressure should be treated with medicines if weight loss and exercise do not work.  If you are 50-52 years old, ask your health care provider if you should take aspirin to prevent strokes.  Diabetes screening involves taking a blood sample to check your fasting blood sugar level. This should be done once every 3 years, after age  11, if you are within normal weight and without risk factors for diabetes. Testing should be considered at a younger age or be carried out more frequently if you are overweight and have at least 1 risk factor for diabetes.  Breast cancer screening is essential preventive care for women. You should practice "breast self-awareness." This means understanding the normal appearance and feel of your breasts and may include breast self-examination. Any changes detected, no matter how small, should be reported to a health care provider. Women in their 45s and 30s should have a clinical breast exam (CBE) by a health care provider as part of a regular health exam every 1 to 3 years. After age 75, women should have a CBE every year. Starting at age 60, women should consider having a mammogram (breast X-ray test) every year. Women who have a family history of breast cancer should talk to their health care provider about genetic screening. Women at a high risk of breast cancer should talk to their health care providers about having an MRI and a mammogram every year.  Breast cancer gene (BRCA)-related cancer risk assessment is recommended for women who have family members with BRCA-related cancers. BRCA-related cancers include breast, ovarian, tubal, and peritoneal cancers. Having family members with  these cancers may be associated with an increased risk for harmful changes (mutations) in the breast cancer genes BRCA1 and BRCA2. Results of the assessment will determine the need for genetic counseling and BRCA1 and BRCA2 testing.  Routine pelvic exams to screen for cancer are no longer recommended for nonpregnant women who are considered low risk for cancer of the pelvic organs (ovaries, uterus, and vagina) and who do not have symptoms. Ask your health care provider if a screening pelvic exam is right for you.  If you have had past treatment for cervical cancer or a condition that could lead to cancer, you need Pap tests  and screening for cancer for at least 20 years after your treatment. If Pap tests have been discontinued, your risk factors (such as having a new sexual partner) need to be reassessed to determine if screening should be resumed. Some women have medical problems that increase the chance of getting cervical cancer. In these cases, your health care provider may recommend more frequent screening and Pap tests.    Colorectal cancer can be detected and often prevented. Most routine colorectal cancer screening begins at the age of 66 years and continues through age 100 years. However, your health care provider may recommend screening at an earlier age if you have risk factors for colon cancer. On a yearly basis, your health care provider may provide home test kits to check for hidden blood in the stool. Use of a small camera at the end of a tube, to directly examine the colon (sigmoidoscopy or colonoscopy), can detect the earliest forms of colorectal cancer. Talk to your health care provider about this at age 39, when routine screening begins.  Direct exam of the colon should be repeated every 5-10 years through age 55 years, unless early forms of pre-cancerous polyps or small growths are found.  Osteoporosis is a disease in which the bones lose minerals and strength with aging. This can result in serious bone fractures or breaks. The risk of osteoporosis can be identified using a bone density scan. Women ages 86 years and over and women at risk for fractures or osteoporosis should discuss screening with their health care providers. Ask your health care provider whether you should take a calcium supplement or vitamin D to reduce the rate of osteoporosis.  Menopause can be associated with physical symptoms and risks. Hormone replacement therapy is available to decrease symptoms and risks. You should talk to your health care provider about whether hormone replacement therapy is right for you.  Use sunscreen. Apply  sunscreen liberally and repeatedly throughout the day. You should seek shade when your shadow is shorter than you. Protect yourself by wearing long sleeves, pants, a wide-brimmed hat, and sunglasses year round, whenever you are outdoors.  Once a month, do a whole body skin exam, using a mirror to look at the skin on your back. Tell your health care provider of new moles, moles that have irregular borders, moles that are larger than a pencil eraser, or moles that have changed in shape or color.  Stay current with required vaccines (immunizations).  Influenza vaccine. All adults should be immunized every year.  Tetanus, diphtheria, and acellular pertussis (Td, Tdap) vaccine. Pregnant women should receive 1 dose of Tdap vaccine during each pregnancy. The dose should be obtained regardless of the length of time since the last dose. Immunization is preferred during the 27th-36th week of gestation. An adult who has not previously received Tdap or who does not know her  vaccine status should receive 1 dose of Tdap. This initial dose should be followed by tetanus and diphtheria toxoids (Td) booster doses every 10 years. Adults with an unknown or incomplete history of completing a 3-dose immunization series with Td-containing vaccines should begin or complete a primary immunization series including a Tdap dose. Adults should receive a Td booster every 10 years.    Zoster vaccine. One dose is recommended for adults aged 58 years or older unless certain conditions are present.    Pneumococcal 13-valent conjugate (PCV13) vaccine. When indicated, a person who is uncertain of her immunization history and has no record of immunization should receive the PCV13 vaccine. An adult aged 37 years or older who has certain medical conditions and has not been previously immunized should receive 1 dose of PCV13 vaccine. This PCV13 should be followed with a dose of pneumococcal polysaccharide (PPSV23) vaccine. The PPSV23  vaccine dose should be obtained at least 1 or more year(s) after the dose of PCV13 vaccine. An adult aged 57 years or older who has certain medical conditions and previously received 1 or more doses of PPSV23 vaccine should receive 1 dose of PCV13. The PCV13 vaccine dose should be obtained 1 or more years after the last PPSV23 vaccine dose.    Pneumococcal polysaccharide (PPSV23) vaccine. When PCV13 is also indicated, PCV13 should be obtained first. All adults aged 14 years and older should be immunized. An adult younger than age 69 years who has certain medical conditions should be immunized. Any person who resides in a nursing home or long-term care facility should be immunized. An adult smoker should be immunized. People with an immunocompromised condition and certain other conditions should receive both PCV13 and PPSV23 vaccines. People with human immunodeficiency virus (HIV) infection should be immunized as soon as possible after diagnosis. Immunization during chemotherapy or radiation therapy should be avoided. Routine use of PPSV23 vaccine is not recommended for American Indians, [place] Natives, or people younger than 65 years unless there are medical conditions that require PPSV23 vaccine. When indicated, people who have unknown immunization and have no record of immunization should receive PPSV23 vaccine. One-time revaccination 5 years after the first dose of PPSV23 is recommended for people aged 19-64 years who have chronic kidney failure, nephrotic syndrome, asplenia, or immunocompromised conditions. People who received 1-2 doses of PPSV23 before age 71 years should receive another dose of PPSV23 vaccine at age 48 years or later if at least 5 years have passed since the previous dose. Doses of PPSV23 are not needed for people immunized with PPSV23 at or after age 61 years.   Preventive Services / Frequency  Ages 27 years and over  Blood pressure check.  Lipid and cholesterol check.  Lung  cancer screening. / Every year if you are aged 46-80 years and have a 30-pack-year history of smoking and currently smoke or have quit within the past 15 years. Yearly screening is stopped once you have quit smoking for at least 15 years or develop a health problem that would prevent you from having lung cancer treatment.  Clinical breast exam.** / Every year after age 65 years.   BRCA-related cancer risk assessment.** / For women who have family members with a BRCA-related cancer (breast, ovarian, tubal, or peritoneal cancers).  Mammogram.** / Every year beginning at age 44 years and continuing for as long as you are in good health. Consult with your health care provider.  Pap test.** / Every 3 years starting at age 24 years through  age 18 or 63 years with 3 consecutive normal Pap tests. Testing can be stopped between 65 and 70 years with 3 consecutive normal Pap tests and no abnormal Pap or HPV tests in the past 10 years.  Fecal occult blood test (FOBT) of stool. / Every year beginning at age 40 years and continuing until age 66 years. You may not need to do this test if you get a colonoscopy every 10 years.  Flexible sigmoidoscopy or colonoscopy.** / Every 5 years for a flexible sigmoidoscopy or every 10 years for a colonoscopy beginning at age 68 years and continuing until age 75 years.  Hepatitis C blood test.** / For all people born from [birth year] through 1963 and any individual with known risks for hepatitis C.  Osteoporosis screening.** / A one-time screening for women ages 68 years and over and women at risk for fractures or osteoporosis.  Skin self-exam. / Monthly.  Influenza vaccine. / Every year.  Tetanus, diphtheria, and acellular pertussis (Tdap/Td) vaccine.** / 1 dose of Td every 10 years.  Zoster vaccine.** / 1 dose for adults aged 41 years or older.  Pneumococcal 13-valent conjugate (PCV13) vaccine.** / Consult your health care provider.  Pneumococcal polysaccharide (PPSV23)  vaccine.** / 1 dose for all adults aged 68 years and older. Screening for abdominal aortic aneurysm (AAA)  by ultrasound is recommended for people who have history of high blood pressure or who are current or former smokers. ++++++++++++++++++++ Recommend Adult Low Dose Aspirin or  coated  Aspirin 81 mg daily  To reduce risk of Colon Cancer 40 %,  Skin Cancer 26 % ,  Melanoma 46%  and  Pancreatic cancer 60% ++++++++++++++++++++ Vitamin D goal  is between 70-100.  Please make sure that you are taking your Vitamin D as directed.  It is very important as a natural anti-inflammatory  helping hair, skin, and nails, as well as reducing stroke and heart attack risk.  It helps your bones and helps with mood. It also decreases numerous cancer risks so please take it as directed.  Low Vit D is associated with a 200-300% higher risk for CANCER  and 200-300% higher risk for HEART   ATTACK  &  STROKE.   .....................................Marland Kitchen It is also associated with higher death rate at younger ages,  autoimmune diseases like Rheumatoid arthritis, Lupus, Multiple Sclerosis.    Also many other serious conditions, like depression, Alzheimer's Dementia, infertility, muscle aches, fatigue, fibromyalgia - just to name a few. ++++++++++++++++++ Recommend the book "The END of DIETING" by Dr Excell Seltzer  & the book "The END of DIABETES " by Dr Excell Seltzer At [email or web address] - get book & Audio CD's    Being diabetic has a  300% increased risk for heart attack, stroke, cancer, and alzheimer- type vascular dementia. It is very important that you work harder with diet by avoiding all foods that are white. Avoid white rice (brown & wild rice is OK), white potatoes (sweetpotatoes in moderation is OK), White bread or wheat bread or anything made out of white flour like bagels, donuts, rolls, buns, biscuits, cakes, pastries, cookies, pizza crust, and pasta (made from white flour & egg whites) - vegetarian pasta or  spinach or wheat pasta is OK. Multigrain breads like Arnold's or Pepper[employer], or multigrain sandwich thins or flatbreads.  Diet, exercise and weight loss can reverse and cure diabetes in the early stages.  Diet, exercise and weight loss is very important in the control and prevention of complications of  diabetes which affects every system in your body, ie. Brain - dementia/stroke, eyes - glaucoma/blindness, heart - heart attack/heart failure, kidneys - dialysis, stomach - gastric paralysis, intestines - malabsorption, nerves - severe painful neuritis, circulation - gangrene & loss of a leg(s), and finally cancer and Alzheimers.    I recommend avoid fried & greasy foods,  sweets/candy, white rice (brown or wild rice or Quinoa is OK), white potatoes (sweet potatoes are OK) - anything made from white flour - bagels, doughnuts, rolls, buns, biscuits,white and wheat breads, pizza crust and traditional pasta made of white flour & egg white(vegetarian pasta or spinach or wheat pasta is OK).  Multi-grain bread is OK - like multi-grain flat bread or sandwich thins. Avoid alcohol in excess. Exercise is also important.    Eat all the vegetables you want - avoid meat, especially red meat and dairy - especially cheese.  Cheese is the most concentrated form of trans-fats which is the worst thing to clog up our arteries. Veggie cheese is OK which can be found in the fresh produce section at [employer] or [employer] or [employer]  +++++++++++++++++++ DASH Eating Plan  DASH stands for "Dietary Approaches to Stop Hypertension."   The DASH eating plan is a healthy eating plan that has been shown to reduce high blood pressure (hypertension). Additional health benefits may include reducing the risk of type 2 diabetes mellitus, heart disease, and stroke. The DASH eating plan may also help with weight loss. WHAT DO I NEED TO KNOW ABOUT THE DASH EATING PLAN? For the DASH eating plan, you will follow these general  guidelines:  Choose foods with a percent daily value for sodium of less than 5% (as listed on the food label).  Use salt-free seasonings or herbs instead of table salt or sea salt.  Check with your health care provider or pharmacist before using salt substitutes.  Eat lower-sodium products, often labeled as "lower sodium" or "no salt added."  Eat fresh foods.  Eat more vegetables, fruits, and low-fat dairy products.  Choose whole grains. Look for the word "whole" as the first word in the ingredient list.  Choose fish   Limit sweets, desserts, sugars, and sugary drinks.  Choose heart-healthy fats.  Eat veggie cheese   Eat more home-cooked food and less restaurant, buffet, and fast food.  Limit fried foods.  Cook foods using methods other than frying.  Limit canned vegetables. If you do use them, rinse them well to decrease the sodium.  When eating at a restaurant, ask that your food be prepared with less salt, or no salt if possible.                      WHAT FOODS CAN I EAT? Read Dr Fara Olden Fuhrman's books on The End of Dieting & The End of Diabetes  Grains Whole grain or whole wheat bread. Brown rice. Whole grain or whole wheat pasta. Quinoa, bulgur, and whole grain cereals. Low-sodium cereals. Corn or whole wheat flour tortillas. Whole grain cornbread. Whole grain crackers. Low-sodium crackers.  Vegetables Fresh or frozen vegetables (raw, steamed, roasted, or grilled). Low-sodium or reduced-sodium tomato and vegetable juices. Low-sodium or reduced-sodium tomato sauce and paste. Low-sodium or reduced-sodium canned vegetables.   Fruits All fresh, canned (in natural juice), or frozen fruits.  Protein Products  All fish and seafood.  Dried beans, peas, or lentils. Unsalted nuts and seeds. Unsalted canned beans.  Dairy Low-fat dairy products, such as skim or 1% milk, 2%  or reduced-fat cheeses, low-fat ricotta or cottage cheese, or plain low-fat yogurt. Low-sodium or  reduced-sodium cheeses.  Fats and Oils Tub margarines without trans fats. Light or reduced-fat mayonnaise and salad dressings (reduced sodium). Avocado. Safflower, olive, or canola oils. Natural peanut or almond butter.  Other Unsalted popcorn and pretzels. The items listed above may not be a complete list of recommended foods or beverages. Contact your dietitian for more options.  +++++++++++++++  WHAT FOODS ARE NOT RECOMMENDED? Grains/ White flour or wheat flour White bread. White pasta. White rice. Refined cornbread. Bagels and croissants. Crackers that contain trans fat.  Vegetables  Creamed or fried vegetables. Vegetables in a . Regular canned vegetables. Regular canned tomato sauce and paste. Regular tomato and vegetable juices.  Fruits Dried fruits. Canned fruit in light or heavy syrup. Fruit juice.  Meat and Other Protein Products Meat in general - RED meat & White meat.  Fatty cuts of meat. Ribs, chicken wings, all processed meats as bacon, sausage, bologna, salami, fatback, hot dogs, bratwurst and packaged luncheon meats.  Dairy Whole or 2% milk, cream, half-and-half, and cream cheese. Whole-fat or sweetened yogurt. Full-fat cheeses or blue cheese. Non-dairy creamers and whipped toppings. Processed cheese, cheese spreads, or cheese curds.  Condiments Onion and garlic salt, seasoned salt, table salt, and sea salt. Canned and packaged gravies. Worcestershire sauce. Tartar sauce. Barbecue sauce. Teriyaki sauce. Soy sauce, including reduced sodium. Steak sauce. Fish sauce. Oyster sauce. Cocktail sauce. Horseradish. Ketchup and mustard. Meat flavorings and tenderizers. Bouillon cubes. Hot sauce. Tabasco sauce. Marinades. Taco seasonings. Relishes.  Fats and Oils Butter, stick margarine, lard, shortening and bacon fat. Coconut, palm kernel, or palm oils. Regular salad dressings.  Pickles and olives. Salted popcorn and pretzels.  The items listed above may not be a complete  list of foods and beverages to avoid.

## 2019-06-22 LAB — COMPLETE METABOLIC PANEL WITH GFR
AG Ratio: 1.8 (calc) (ref 1.0–2.5)
ALT: 15 U/L (ref 9–46)
AST: 16 U/L (ref 10–35)
Albumin: 4.1 g/dL (ref 3.6–5.1)
Alkaline phosphatase (APISO): 61 U/L (ref 35–144)
BUN/Creatinine Ratio: 13 (calc) (ref 6–22)
BUN: 25 mg/dL (ref 7–25)
CO2: 23 mmol/L (ref 20–32)
Calcium: 9.3 mg/dL (ref 8.6–10.3)
Chloride: 102 mmol/L (ref 98–110)
Creat: 1.95 mg/dL — ABNORMAL HIGH (ref 0.70–1.11)
GFR, Est African American: 36 mL/min/{1.73_m2} — ABNORMAL LOW (ref >=60)
GFR, Est Non African American: 31 mL/min/{1.73_m2} — ABNORMAL LOW (ref >=60)
Globulin: 2.3 g/dL (calc) (ref 1.9–3.7)
Glucose, Bld: 281 mg/dL — ABNORMAL HIGH (ref 65–99)
Potassium: 4.9 mmol/L (ref 3.5–5.3)
Sodium: 137 mmol/L (ref 135–146)
Total Bilirubin: 0.6 mg/dL (ref 0.2–1.2)
Total Protein: 6.4 g/dL (ref 6.1–8.1)

## 2019-06-22 LAB — HEMOGLOBIN A1C
Hgb A1c MFr Bld: 10.4 % of total Hgb — ABNORMAL HIGH (ref <5.7)
Mean Plasma Glucose: 252 (calc)
eAG (mmol/L): 13.9 (calc)

## 2019-06-22 LAB — CBC WITH DIFFERENTIAL/PLATELET
Absolute Monocytes: 746 cells/uL (ref 200–950)
Basophils Absolute: 57 cells/uL (ref 0–200)
Basophils Relative: 0.7 %
Eosinophils Absolute: 312 cells/uL (ref 15–500)
Eosinophils Relative: 3.8 %
HCT: 45.1 % (ref 38.5–50.0)
Hemoglobin: 14.8 g/dL (ref 13.2–17.1)
Lymphs Abs: 1419 cells/uL (ref 850–3900)
MCH: 29.6 pg (ref 27.0–33.0)
MCHC: 32.8 g/dL (ref 32.0–36.0)
MCV: 90.2 fL (ref 80.0–100.0)
MPV: 11.2 fL (ref 7.5–12.5)
Monocytes Relative: 9.1 %
Neutro Abs: 5666 cells/uL (ref 1500–7800)
Neutrophils Relative %: 69.1 %
Platelets: 186 10*3/uL (ref 140–400)
RBC: 5 10*6/uL (ref 4.20–5.80)
RDW: 13 % (ref 11.0–15.0)
Total Lymphocyte: 17.3 %
WBC: 8.2 10*3/uL (ref 3.8–10.8)

## 2019-06-22 LAB — LIPID PANEL
Cholesterol: 156 mg/dL (ref <200)
HDL: 37 mg/dL — ABNORMAL LOW (ref >=40)
LDL Cholesterol (Calc): 76 mg/dL (calc)
Non-HDL Cholesterol (Calc): 119 mg/dL (calc) (ref <130)
Total CHOL/HDL Ratio: 4.2 (calc) (ref <5.0)
Triglycerides: 353 mg/dL — ABNORMAL HIGH (ref <150)

## 2019-06-22 LAB — TSH: TSH: 2.33 mIU/L (ref 0.40–4.50)

## 2019-06-22 LAB — PSA: PSA: 1.5 ng/mL (ref <=4.0)

## 2019-06-22 LAB — VITAMIN D 25 HYDROXY (VIT D DEFICIENCY, FRACTURES): Vit D, 25-Hydroxy: 77 ng/mL (ref 30–100)

## 2019-07-04 ENCOUNTER — Other Ambulatory Visit: Payer: Self-pay

## 2019-07-04 ENCOUNTER — Ambulatory Visit (INDEPENDENT_AMBULATORY_CARE_PROVIDER_SITE_OTHER): Payer: Medicare Other

## 2019-07-04 VITALS — Temp 97.3°F

## 2019-07-04 DIAGNOSIS — Z23 Encounter for immunization: Secondary | ICD-10-CM | POA: Diagnosis not present

## 2019-07-04 NOTE — Progress Notes (Signed)
Patient presents to the office forHDFlu Vaccine. Vaccine administered toLEFTDeltoid withoutanycomplication. Temperature taken and recorded 

## 2019-08-08 ENCOUNTER — Other Ambulatory Visit: Payer: Self-pay | Admitting: Physician Assistant

## 2019-08-09 ENCOUNTER — Ambulatory Visit: Payer: Self-pay | Admitting: Adult Health

## 2019-08-11 NOTE — Progress Notes (Signed)
ANNUAL MEDICARE WELLNESS AND 6 WEEK F/U FOR POORLY CONTROLLED DIABETES AND BP  Assessment and Plan:   Encounter for Annual Medicare Wellness Visit  Hypertension At goal; continue medications Monitor blood pressure at home. Continue DASH diet.   Reminder to go to the ER if any CP, SOB, nausea, dizziness, severe HA, changes vision/speech, left arm numbness and tingling and jaw pain.  Cholesterol Continue medication: pravastatin 40 mg  LDL goal <70 Continue diet and exercise.  Check lipid panel.   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  Check A1C q76m Morbid Obesity - BMI 35+  with co morbidities (T2DM, htn, hyperlipidemia)  Long discussion about weight loss, diet, and exercise Discussed ideal weight for height (below 176) and initial weight goal (245) Patient will work on increasing vegetables and increase length and frequency of exercise Will follow up in 3 months  Vitamin D Def At goal at recent check; continue to recommend supplementation for goal of 70-100 Defer vitamin D level  Stage 3 chronic kidney disease due to type 2 diabetes mellitus (HCC) Increase fluids, avoid NSAIDS, monitor sugars, will monitor Encouraged hydration 80+ oz of water daily  Diabetic [place] polyneuropathy [place] No wounds at this time; has had recent diabetic foot exam  Reminded patient to check feet daily for wounds Patient denies podiatry referral today but will consider in the future  History of colonic polyps Patient refuses follow up colonoscopy, discussed cologuard which patient refuses at this time, will continue to recommend, also declines hemoccult  despite strong recommendation Continue ASA daily  Non compliance with medical treatment Discussed barriers, patient willing to work on diet, will switch glipizide to glimepiride  Gluttony Discussed concerns with current diet and impact on long term outcomes related to diabetes and cardiovascular health. He does well with his wife fixing his plate and will resume this  Chronic bilateral knee pain Bone on bone per patient, establised with Dr. DEverardo Alltylenol/topicals only for pain due to kidney functions He will follow up with ortho to discuss due to progressive sx limiting mobility Advised he wear bilateral knee sleeve supports for stability  Advised needs to lose weight and improve glucose prior to any procedures    Orders Placed This Encounter  Procedures  . Fructosamine  . COMPLETE METABOLIC PANEL WITH GFR  . Parathyroid Hormone, Intact w/Ca     Continue diet and meds as discussed. Further disposition pending results of labs. Discussed med's effects and SE's.   Over 30 minutes of exam, counseling, chart review, and critical decision making was performed.   Future Appointments  Date Time Provider DLolita 06/21/2020 10:00 AM MGarnet Sierras NP [facility] None   During the course of the visit the patient was educated and counseled about appropriate screening and preventive services including:    Pneumococcal vaccine   Influenza vaccine  Td vaccine  Screening electrocardiogram  Colorectal cancer screening  Diabetes screening  Glaucoma screening  Nutrition counseling    -------------------------------------------------------------------------------------------------------------------------  HPI 84y.o. male  presents for 6 week follow up on hypertension, poorly controlled diabetes with CKD and neuropathy, morbid obesity.    He has bil knee pain, has seen Dr. DMarlou Sabut  several years ago, bothering him more and plans to follow up.    BMI is Body  mass index is 37.15 kg/m., he has not been working on diet, no exercise due to knee pain, can't even tolerate stationary cycle. He gained weight and wife has put him back on diet, she fixes his plate.  Wt Readings from Last 3 Encounters:  08/15/19 251 lb 9.6 oz (114.1 kg)  06/21/19 251 lb (113.9 kg)  02/03/19 247 lb (112 kg)   He does not check his BP at home, today their BP is BP: 116/64   He does workout. He denies chest pain, shortness of breath, dizziness.    He is on cholesterol medication (pravastatin 40 mg daily) and denies myalgias. His cholesterol is not at goal. The cholesterol last visit was:   Lab Results  Component Value Date   CHOL 156 06/21/2019   HDL 37 (L) 06/21/2019   LDLCALC 76 06/21/2019   TRIG 353 (H) 06/21/2019   CHOLHDL 4.2 06/21/2019    He has not been working on diet and exercise for DMII and has CKD stage 3 P neuropathy Hyperlipidemia on pravastatin On ASA, ACEi Off metformin due to GFR <45 glipizide '5mg'$  TID - admits he has only been taking twice a day, forgets at lunch Meter: Freestyle He admits he has not been checking in the past month  Denies hyperglycemia, hypoglycemia , increased appetite, nausea, polydipsia and polyuria.  Last A1C in the office wasl:  Lab Results  Component Value Date   HGBA1C 10.4 (H) 06/21/2019    He has CKD III associated with T2DM monitored via this office:  Lab Results  Component Value Date   GFRNONAA 31 (L) 06/21/2019   Patient is on Vitamin D supplement and at goal:  Lab Results  Component Value Date   VD25OH 77 06/21/2019        Current Medications:  Current Outpatient Medications on File Prior to Visit  Medication Sig  . aspirin EC 81 MG tablet Take 81 mg by mouth daily.  . Cinnamon 500 MG TABS Take 1,000 mg by mouth.  . doxazosin (CARDURA) 8 MG tablet TAKE 1 TABLET BY MOUTH ONCE DAILY FOR BLOOD PRESSURE, PROSTATE  . famotidine (PEPCID) 40 MG tablet Take 1 tablet Daily for Indigestion & Heartburn  .  glucose blood (FREESTYLE INSULINX TEST) test strip USE 1 STRIP TO CHECK GLUCOSE ONCE DAILY DX-E11.9  . glucose monitoring kit (FREESTYLE) monitoring kit check blood sugar 1 time daily-DX-E11.22.  . hydrochlorothiazide (HYDRODIURIL) 25 MG tablet Take 1 tablet Daily for BP & Fluid / Swelling  . Lancets (FREESTYLE) lancets Check blood sugar 1 time daily-DX-E11.22.  . lisinopril (ZESTRIL) 40 MG tablet Take 1 tablet Daily for BP & Kidney Protection  . magnesium oxide (MAG-OX) 400 MG tablet Take 400 mg by mouth daily.  . Multiple Vitamins-Minerals (MULTIVITAMIN WITH MINERALS) tablet Take 1 tablet by mouth daily.    . pravastatin (PRAVACHOL) 40 MG tablet TAKE 1 TABLET BY MOUTH AT BEDTIME FOR CHOLESTEROL  . VITAMIN D PO Take 5,000 Units by mouth 2 (two) times daily.   No current facility-administered medications on file prior to visit.     Medical History:  Past Medical History:  Diagnosis Date  . Arthritis   . BPH (benign prostatic hyperplasia)   . Cataract   . CKD (chronic kidney disease), stage III   . Depression    "usually a happy go lucky"  . Diabetes mellitus   . Diverticulosis   .  GERD (gastroesophageal reflux disease)    sometimes will take omeprazole  . Hyperlipidemia   . Hypertension    Allergies No Known Allergies  SURGICAL HISTORY He  has a past surgical history that includes Tonsillectomy and adenoidectomy; Vein ligation and stripping; Knee arthroscopy; Colonoscopy (2005, 08/21/11); Fracture surgery; Cholecystectomy (N/A, 11/29/2015); and Cataract extraction, bilateral (Bilateral, 2020). FAMILY HISTORY His family history includes Diabetes in his brother; Prostate cancer in his brother. SOCIAL HISTORY He  reports that he has been smoking cigars. He has never used smokeless tobacco. He reports current alcohol use. He reports that he does not use drugs.  Immunization History  Administered Date(s) Administered  . DT 08/31/2015  . Influenza Whole 05/24/2011, 08/06/2012,  07/07/2013  . Influenza, High Dose Seasonal PF 06/21/2014, 08/31/2015, 06/03/2016, 07/15/2017, 07/29/2018, 07/04/2019  . Pneumococcal Conjugate-13 06/21/2014  . Pneumococcal Polysaccharide-23 03/10/2005   Preventative care: Last colonoscopy: 2012 due 2015 but will not have another, declines, also won't do hemoccult  Prior vaccinations: TD or Tdap: 2016 Influenza: 06/2019 Pneumococcal: 2006 Prevnar13: 2015 Shingles/Zostavax: declines  Eye exam: Was referred to Dr. Einar Gip in 2019, reports has seen in the past month - will request report,  Dentist: last in 06/2017, follow up in 07/2017 for cavity care, has full upper partials.   Patient Care Team: Unk Pinto, MD as PCP - General (Internal Medicine) Marlou Sa Tonna Corner, MD as Consulting Physician (Orthopedic Surgery)   MEDICARE WELLNESS OBJECTIVES: Physical activity: Current Exercise Habits: The patient does not participate in regular exercise at present, Exercise limited by: orthopedic condition(s) Cardiac risk factors: Cardiac Risk Factors include: advanced age (>51mn, >>32women);diabetes mellitus;dyslipidemia;hypertension;obesity (BMI >30kg/m2);sedentary lifestyle;male gender Depression/mood screen:   [facility]  Decreased Interest 0  Down, Depressed, Hopeless 0  PHQ - 2 Score 0    ADLs:  In your present state of health, do you have any difficulty performing the following activities: 08/15/2019 11/02/2018  Hearing? N N  Vision? N N  Difficulty concentrating or making decisions? N N  Walking or climbing stairs? Y N  Comment bil knees are bone on bone, fill follow up with Dr. DMarlou Sa-  Dressing or bathing? N N  Doing errands, shopping? N N  Some recent data might be hidden     Cognitive Testing  Alert? Yes  Normal Appearance?Yes  Oriented to person? Yes  Place? Yes   Time? Yes  Recall of three objects?  Yes  Can perform simple calculations? Yes  Displays appropriate judgment?Yes  Can  read the correct time from a watch face?Yes  EOL planning: Does Patient Have a Medical Advance Directive?: Yes Type of Advance Directive: Healthcare Power of Attorney, Living will Does patient want to make changes to medical advance directive?: No - Patient declined Copy of HB[place] Chart?: No - copy requested   Review of Systems:  Review of Systems  Constitutional: Negative for malaise/fatigue and weight loss.  HENT: Negative for hearing loss and tinnitus.   Eyes: Negative for blurred vision and double vision.  Respiratory: Negative for cough, sputum production, shortness of breath and wheezing.   Cardiovascular: Negative for chest pain, palpitations, orthopnea, claudication and leg swelling.  Gastrointestinal: Negative for abdominal pain, blood in stool, constipation, diarrhea, heartburn, melena, nausea and vomiting.  Genitourinary: Negative.   Musculoskeletal: Positive for joint pain. Negative for falls and myalgias. Back pain: bil knees bone on bone   Skin: Negative for rash.  Neurological: Positive for tingling (Intermittent in bilateral feet). Negative for  dizziness, sensory change, weakness and headaches.  Endo/Heme/Allergies: Negative for polydipsia.  Psychiatric/Behavioral: Negative.  Negative for depression and memory loss. The patient is not nervous/anxious and does not have insomnia.   All other systems reviewed and are negative.   Physical Exam: BP 116/64   Pulse 92   Temp (!) 97.3 F (36.3 C)   Resp 18   Ht '5\' 9"'$  (1.753 m)   Wt 251 lb 9.6 oz (114.1 kg)   BMI 37.15 kg/m  Wt Readings from Last 3 Encounters:  08/15/19 251 lb 9.6 oz (114.1 kg)  06/21/19 251 lb (113.9 kg)  02/03/19 247 lb (112 kg)   General Appearance: Well nourished, obese, in no apparent distress. Eyes: PERRLA, EOMs, conjunctiva no swelling or erythema Sinuses: No Frontal/maxillary tenderness ENT/Mouth: Ext aud canals clear, L TMs without erythema, bulging, R TM with  extensive scarring, erythematous around bony structures (per the patient he has chronic drainage issue that has been cleared by ENT). No erythema, swelling, or exudate on post pharynx.  Tonsils not swollen or erythematous. Hearing normal.  Neck: Supple, thyroid normal.  Respiratory: Respiratory effort normal, BS equal bilaterally without rales, rhonchi, wheezing or stridor.  Cardio: RRR with no MRGs. 1+ peripheral pulses equally with 1+ non-pitting edema bilateral ankles.  Abdomen: Soft, rounded obese abdomen + BS.  Non tender, no guarding, rebound, palpable hernias or masses. Lymphatics: Non tender without lymphadenopathy.  Musculoskeletal: BIl knees with bony enlargement, crepitus, no effusion, 5/5 strength, antalgic gait Skin: Warm, dry without rashes, lesions, ecchymosis.  Neuro: Cranial nerves intact. No cerebellar symptoms. Sensation in heels diminished bilaterally to monofilament testing.  Psych: Awake and oriented X 3, normal affect, Insight and Judgment appropriate.   Medicare Attestation I have personally reviewed: The patient's medical and social history Their use of alcohol, tobacco or illicit drugs Their current medications and supplements The patient's functional ability including ADLs,fall risks, home safety risks, cognitive, and hearing and visual impairment Diet and physical activities Evidence for depression or mood disorders  The patient's weight, height, BMI, and visual acuity have been recorded in the chart.  I have made referrals, counseling, and provided education to the patient based on review of the above and I have provided the patient with a written personalized care plan for preventive services.    Izora Ribas, NP 10:44 AM [facility]

## 2019-08-15 ENCOUNTER — Other Ambulatory Visit: Payer: Self-pay

## 2019-08-15 ENCOUNTER — Encounter: Payer: Self-pay | Admitting: Adult Health

## 2019-08-15 ENCOUNTER — Ambulatory Visit (INDEPENDENT_AMBULATORY_CARE_PROVIDER_SITE_OTHER): Payer: Medicare Other | Admitting: Adult Health

## 2019-08-15 VITALS — BP 116/64 | HR 92 | Temp 97.3°F | Resp 18 | Ht 69.0 in | Wt 251.6 lb

## 2019-08-15 DIAGNOSIS — E1121 Type 2 diabetes mellitus with diabetic nephropathy: Secondary | ICD-10-CM

## 2019-08-15 DIAGNOSIS — N183 Chronic kidney disease, stage 3 unspecified: Secondary | ICD-10-CM

## 2019-08-15 DIAGNOSIS — Z9119 Patient's noncompliance with other medical treatment and regimen: Secondary | ICD-10-CM

## 2019-08-15 DIAGNOSIS — Z91199 Patient's noncompliance with other medical treatment and regimen due to unspecified reason: Secondary | ICD-10-CM

## 2019-08-15 DIAGNOSIS — M25562 Pain in left knee: Secondary | ICD-10-CM

## 2019-08-15 DIAGNOSIS — E1122 Type 2 diabetes mellitus with diabetic chronic kidney disease: Secondary | ICD-10-CM | POA: Diagnosis not present

## 2019-08-15 DIAGNOSIS — E559 Vitamin D deficiency, unspecified: Secondary | ICD-10-CM

## 2019-08-15 DIAGNOSIS — N1832 Chronic kidney disease, stage 3b: Secondary | ICD-10-CM

## 2019-08-15 DIAGNOSIS — Z0000 Encounter for general adult medical examination without abnormal findings: Secondary | ICD-10-CM

## 2019-08-15 DIAGNOSIS — G8929 Other chronic pain: Secondary | ICD-10-CM

## 2019-08-15 DIAGNOSIS — I10 Essential (primary) hypertension: Secondary | ICD-10-CM

## 2019-08-15 DIAGNOSIS — Z0001 Encounter for general adult medical examination with abnormal findings: Secondary | ICD-10-CM | POA: Diagnosis not present

## 2019-08-15 DIAGNOSIS — Z6836 Body mass index (BMI) 36.0-36.9, adult: Secondary | ICD-10-CM | POA: Diagnosis not present

## 2019-08-15 DIAGNOSIS — E1169 Type 2 diabetes mellitus with other specified complication: Secondary | ICD-10-CM | POA: Diagnosis not present

## 2019-08-15 DIAGNOSIS — R6889 Other general symptoms and signs: Secondary | ICD-10-CM

## 2019-08-15 DIAGNOSIS — E785 Hyperlipidemia, unspecified: Secondary | ICD-10-CM

## 2019-08-15 DIAGNOSIS — M25561 Pain in right knee: Secondary | ICD-10-CM

## 2019-08-15 DIAGNOSIS — E1142 Type 2 diabetes mellitus with diabetic polyneuropathy: Secondary | ICD-10-CM

## 2019-08-15 DIAGNOSIS — Z86010 Personal history of colonic polyps: Secondary | ICD-10-CM

## 2019-08-15 DIAGNOSIS — Z79899 Other long term (current) drug therapy: Secondary | ICD-10-CM

## 2019-08-15 MED ORDER — GLIMEPIRIDE 4 MG PO TABS: ORAL_TABLET | ORAL | 1 refills | Status: DC

## 2019-08-15 NOTE — Patient Instructions (Addendum)
Edwin Osborne , Thank you for taking time to come for your Medicare Wellness Visit. I appreciate your ongoing commitment to your health goals. Please review the following plan we discussed and let me know if I can assist you in the future.   These are the goals we discussed: Goals    . Exercise 5x per week (15 min per time)    . HEMOGLOBIN A1C < 7.0    . Weight (lb) < 225 lb (102.1 kg)       This is a list of the screening recommended for you and due dates:  Health Maintenance  Topic Date Due  . Eye exam for diabetics  09/09/2015  . Colon Cancer Screening  12/18/2019  . Hemoglobin A1C  12/19/2019  . Complete foot exam   06/20/2020  . Tetanus Vaccine  08/30/2025  . Flu Shot  Completed  . Pneumonia vaccines  Completed    Please follow up with Dr. Marlou Sa - ortho - [phone]  Can take tylenol extra strength - 500 mg, 1-2 tabs three times a day as needed (max 6-7 tabs daily)   Consider picking up knee brace  Please have your wife start fixing your plate again -   Start weighing at least once a week - need to start seeing some weight loss progress  Need to check fasting glucose daily    STOP taking glipizide - START taking glimepiride - 1 tab with breakfast, 1/2 tab with dinner and start checking sugars daily in the morning      Glimepiride tablets What is this medicine? GLIMEPIRIDE (GLYE me pye ride) helps to treat type 2 diabetes. Treatment is combined with diet and exercise. This medicine helps your body use insulin better. This medicine may be used for other purposes; ask your health care provider or pharmacist if you have questions. COMMON BRAND NAME(S): Amaryl What should I tell my health care provider before I take this medicine? They need to know if you have any of these conditions:  diabetic ketoacidosis  glucose-6-phosphate dehydrogenase deficiency  heart disease  kidney disease  liver disease  severe infection or injury  thyroid disease  an  unusual or allergic reaction to glimepiride, sulfa drugs, other medicines, foods, dyes, or preservatives  pregnancy or recent attempts to get pregnant  breast-feeding How should I use this medicine? Take this medicine by mouth. Swallow with a drink of water. Follow the directions on the prescription label. Take your dose at the same time each day, with breakfast or your first large meal. Do not take more often than directed. Talk to your pediatrician regarding the use of this medicine in children. Special care may be needed. Elderly patients over 75 years old can have a stronger reaction and need a smaller dose. Overdosage: If you think you have taken too much of this medicine contact a poison control center or emergency room at once. NOTE: This medicine is only for you. Do not share this medicine with others. What if I miss a dose? If you miss a dose, take it as soon as you can. If it is almost time for your next dose, take only that dose. Do not take double or extra doses. What may interact with this medicine?  bosentan  chloramphenicol  cisapride  clarithromycin  medicines for fungal or yeast infections  metoclopramide  probenecid  warfarin Many medications may cause an increase or decrease in blood sugar, these include:  alcohol containing beverages  aspirin and aspirin-like drugs  chloramphenicol  chromium  male hormones, like estrogens or progestins and birth control pills  fluoxetine  heart medicines like disopyramide  isoniazid  male hormones or anabolic steroids  medicines called MAO Inhibitors like Nardil, Parnate, Marplan, Eldepryl  medicines for allergies, asthma, cold, or cough  medicines for mental problems  medicines for weight loss  niacin  NSAIDs, medicines for pain and inflammation, like ibuprofen or naproxen  pentamidine  phenytoin  probenecid  quinolone antibiotics like ciprofloxacin, levofloxacin, ofloxacin  some herbal  dietary supplements  steroid medicines like prednisone or cortisone  thyroid medicine  water pills or diuretics This list may not describe all possible interactions. Give your health care provider a list of all the medicines, herbs, non-prescription drugs, or dietary supplements you use. Also tell them if you smoke, drink alcohol, or use illegal drugs. Some items may interact with your medicine. What should I watch for while using this medicine? Visit your doctor or health care professional for regular checks on your progress. A test called the HbA1C (A1C) will be monitored. This is a simple blood test. It measures your blood sugar control over the last 2 to 3 months. You will receive this test every 3 to 6 months. Learn how to check your blood sugar. Learn the symptoms of low and high blood sugar and how to manage them. Always carry a quick-source of sugar with you in case you have symptoms of low blood sugar. Examples include hard sugar candy or glucose tablets. Make sure others know that you can choke if you eat or drink when you develop serious symptoms of low blood sugar, such as seizures or unconsciousness. They must get medical help at once. Tell your doctor or health care professional if you have high blood sugar. You might need to change the dose of your medicine. If you are sick or exercising more than usual, you might need to change the dose of your medicine. Do not skip meals. Ask your doctor or health care professional if you should avoid alcohol. Many nonprescription cough and cold products contain sugar or alcohol. These can affect blood sugar. This medicine can make you more sensitive to the sun. Keep out of the sun. If you cannot avoid being in the sun, wear protective clothing and use sunscreen. Do not use sun lamps or tanning beds/booths. Wear a medical ID bracelet or chain, and carry a card that describes your disease and details of your medicine and dosage times. What side  effects may I notice from receiving this medicine? Side effects that you should report to your doctor or health care professional as soon as possible:  allergic reactions like skin rash, itching or hives, swelling of the face, lips, or tongue  breathing problems  dark urine  fever, chills, sore throat  signs and symptoms of low blood sugar such as feeling anxious, confusion, dizziness, increased hunger, unusually weak or tired, sweating, shakiness, cold, irritable, headache, blurred vision, fast heartbeat, loss of consciousness  unusual bleeding or bruising  yellowing of the eyes or skin Side effects that usually do not require medical attention (report to your doctor or health care professional if they continue or are bothersome):  diarrhea  dizziness  headache  heartburn  nausea  stomach gas This list may not describe all possible side effects. Call your doctor for medical advice about side effects. You may report side effects to FDA at 1-800-FDA-1088. Where should I keep my medicine? Keep out of the reach of children. Store at  room temperature below 30 degrees C (86 degrees F). Throw away any unused medicine after the expiration date. NOTE: This sheet is a summary. It may not cover all possible information. If you have questions about this medicine, talk to your doctor, pharmacist, or health care provider.  2020 Elsevier/Gold Standard (2012-12-22 14:29:47)    Chronic Knee Pain, Adult Chronic knee pain is pain in one or both knees that lasts longer than 3 months. Symptoms of chronic knee pain may include swelling, stiffness, and discomfort. Age-related wear and tear (osteoarthritis) of the knee joint is the most common cause of chronic knee pain. Other possible causes include:  A long-term immune-related disease that causes inflammation of the knee (rheumatoid arthritis). This usually affects both knees.  Inflammatory arthritis, such as gout or pseudogout.  An injury  to the knee that causes arthritis.  An injury to the knee that damages the ligaments. Ligaments are strong tissues that connect bones to each other.  Runner's knee or pain behind the kneecap. Treatment for chronic knee pain depends on the cause. The main treatments for chronic knee pain are physical therapy and weight loss. This condition may also be treated with medicines, injections, a knee sleeve or brace, and by using crutches. Rest, ice, compression (pressure), and elevation (RICE) therapy may also be recommended. Follow these instructions at home: If you have a knee sleeve or brace:   Wear it as told by your health care provider. Remove it only as told by your health care provider.  Loosen it if your toes tingle, become numb, or turn cold and blue.  Keep it clean.  If the sleeve or brace is not waterproof: ? Do not let it get wet. ? Remove it if allowed by your health care provider, or cover it with a watertight covering when you take a bath or a shower. Managing pain, stiffness, and swelling      If directed, apply heat to the affected area as often as told by your health care provider. Use the heat source that your health care provider recommends, such as a moist heat pack or a heating pad. ? If you have a removable sleeve or brace, remove it as told by your health care provider. ? Place a towel between your skin and the heat source. ? Leave the heat on for 20-30 minutes. ? Remove the heat if your skin turns bright red. This is especially important if you are unable to feel pain, heat, or cold. You may have a greater risk of getting burned.  If directed, put ice on the affected area. ? If you have a removable sleeve or brace, remove it as told by your health care provider. ? Put ice in a plastic bag. ? Place a towel between your skin and the bag. ? Leave the ice on for 20 minutes, 2-3 times a day.  Move your toes often to reduce stiffness and swelling.  Raise (elevate)  the injured area above the level of your heart while you are sitting or lying down. Activity  Avoid activities where both feet leave the ground at the same time (high-impact activities). Examples are running, jumping rope, and doing jumping jacks.  Return to your normal activities as told by your health care provider. Ask your health care provider what activities are safe for you.  Follow the exercise plan that your health care provider designed for you. Your health care provider may suggest that you: ? Avoid activities that make knee pain worse. This  may require you to change your exercise routines, sport participation, or job duties. ? Wear shoes with cushioned soles. ? Avoid high-impact activities or sports that require running and sudden changes in direction. ? Do physical therapy as told by your health care provider. Physical therapy is planned to match your needs and abilities. It may include exercises for strength, flexibility, stability, and endurance. ? Do exercises that increase balance and strength, such as tai chi and yoga.  Do not use the injured limb to support your body weight until your health care provider says that you can. Use crutches, a cane, or a walker, as told by your health care provider. General instructions  Take over-the-counter and prescription medicines only as told by your health care provider.  Lose weight if you are overweight. Losing even a little weight can reduce knee pain. Ask your health care provider what your ideal weight is, and how to safely lose extra weight. A food expert (dietitian) may be able to help you plan your meals.  Do not use any products that contain nicotine or tobacco, such as cigarettes, e-cigarettes, and chewing tobacco. These can delay healing. If you need help quitting, ask your health care provider.  Keep all follow-up visits as told by your health care provider. This is important. Contact a health care provider if:  You have  knee pain that is not getting better or gets worse.  You are unable to do your physical therapy exercises due to knee pain. Get help right away if:  Your knee swells and the swelling becomes worse.  You cannot move your knee.  You have severe knee pain. Summary  Knee pain that lasts more than 3 months is considered chronic knee pain.  The main treatments for chronic knee pain are physical therapy and weight loss. You may also need to take medicines, wear a knee sleeve or brace, use crutches, and apply ice or heat.  Losing even a little weight can reduce knee pain. Ask your health care provider what your ideal weight is, and how to safely lose extra weight. A food expert (dietitian) may be able to help you plan your meals.  Work with a physical therapist to make a safe exercise program, as told by your health care provider. This information is not intended to replace advice given to you by your health care provider. Make sure you discuss any questions you have with your health care provider. Document Released: 11/18/2018 Document Revised: 11/18/2018 Document Reviewed: 11/18/2018 Elsevier Patient Education  2020 [employer].

## 2019-08-22 LAB — COMPLETE METABOLIC PANEL WITH GFR
AG Ratio: 1.7 (calc) (ref 1.0–2.5)
ALT: 15 U/L (ref 9–46)
AST: 15 U/L (ref 10–35)
Albumin: 4 g/dL (ref 3.6–5.1)
Alkaline phosphatase (APISO): 65 U/L (ref 35–144)
BUN/Creatinine Ratio: 16 (calc) (ref 6–22)
BUN: 31 mg/dL — ABNORMAL HIGH (ref 7–25)
CO2: 25 mmol/L (ref 20–32)
Calcium: 9.4 mg/dL (ref 8.6–10.3)
Chloride: 100 mmol/L (ref 98–110)
Creat: 1.89 mg/dL — ABNORMAL HIGH (ref 0.70–1.11)
GFR, Est African American: 37 mL/min/{1.73_m2} — ABNORMAL LOW (ref >=60)
GFR, Est Non African American: 32 mL/min/{1.73_m2} — ABNORMAL LOW (ref >=60)
Globulin: 2.3 g/dL (calc) (ref 1.9–3.7)
Glucose, Bld: 248 mg/dL — ABNORMAL HIGH (ref 65–99)
Potassium: 5 mmol/L (ref 3.5–5.3)
Sodium: 137 mmol/L (ref 135–146)
Total Bilirubin: 0.5 mg/dL (ref 0.2–1.2)
Total Protein: 6.3 g/dL (ref 6.1–8.1)

## 2019-08-22 LAB — PTH, INTACT AND CALCIUM
Calcium: 9.4 mg/dL (ref 8.6–10.3)
PTH: 19 pg/mL (ref 14–64)

## 2019-08-22 LAB — FRUCTOSAMINE: Fructosamine: 367 umol/L — ABNORMAL HIGH (ref 205–285)

## 2019-08-24 ENCOUNTER — Other Ambulatory Visit: Payer: Self-pay

## 2019-08-24 ENCOUNTER — Other Ambulatory Visit: Payer: Self-pay | Admitting: Internal Medicine

## 2019-08-24 MED ORDER — LISINOPRIL 40 MG PO TABS: ORAL_TABLET | ORAL | 1 refills | Status: DC

## 2019-08-25 ENCOUNTER — Encounter: Payer: Self-pay | Admitting: Internal Medicine

## 2019-09-26 DIAGNOSIS — I700 Atherosclerosis of aorta: Secondary | ICD-10-CM | POA: Insufficient documentation

## 2019-09-26 NOTE — Progress Notes (Signed)
Diabetes Education and Follow-Up Visit  85 y.o.male presents for diabetic education. He has Diabetes Mellitus type 2 x 10-15 years at least. His glipizide was switched to glimperide 4 mg 1 tablet and 1/2 tablet and he is working on diet and portion control with his wife.  He states his sugar With CKD on ACE, NOT on metformin due to GFR at 32 With hyperlipidemia on pravastatin, not at goal less than 70 With neuropathy- not seeing podiatry, no wounds at this time, checks feet daily.   He does not drink soft drinks, he does eat bread and states he ate cake over the new year.   Last hemoglobin A1c was: Lab Results  Component Value Date   HGBA1C 10.4 (H) 06/21/2019   HGBA1C 9.2 (H) 02/03/2019   HGBA1C 9.6 (H) 11/03/2018    BMI is Body mass index is 36.33 kg/m., he is working on diet and exercise. Wt Readings from Last 3 Encounters:  09/27/19 246 lb (111.6 kg)  08/15/19 251 lb 9.6 oz (114.1 kg)  06/21/19 251 lb (113.9 kg)   Pt checks his sugars 2 x day  Lowest sugar was 180  He has hypoglycemia awareness.  Highest sugar was 130   Exercise: None, he has bilateral knee pain  Patient does have CKD He is on ACE He is NOT on metformin due to GFR of 32   Lab Results  Component Value Date   GFRNONAA 32 (L) 08/15/2019   Lab Results  Component Value Date   MICROALBUR 1.0 04/20/2018     He is on a Statin.  He is not at goal of less than 70.  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  Medications  Current Outpatient Medications (Endocrine & Metabolic):  .  glimepiride (AMARYL) 4 MG tablet, Start taking 1 tab daily with breakfast, and if fasting glucose remains 150+ in the morning, take 1/2 tab with dinner. Only take if you are eating.  Current Outpatient Medications (Cardiovascular):  .  doxazosin (CARDURA) 8 MG tablet, TAKE 1 TABLET BY MOUTH ONCE DAILY FOR BLOOD PRESSURE, PROSTATE .  hydrochlorothiazide (HYDRODIURIL) 25 MG tablet, Take 1 tablet Daily for BP & Fluid / Swelling .  lisinopril (ZESTRIL) 40 MG tablet, Take 1 tablet Daily for BP & Kidney Protection .  pravastatin (PRAVACHOL) 40 MG tablet, TAKE 1 TABLET BY MOUTH AT BEDTIME FOR CHOLESTEROL   Current Outpatient Medications (Analgesics):  .  aspirin EC 81 MG tablet, Take 81 mg by mouth daily.   Current Outpatient Medications (Other):  .  Cinnamon 500 MG TABS, Take 1,000 mg by mouth. .  famotidine (PEPCID) 40 MG tablet, Take 1 tablet Daily for Indigestion & Heartburn .  glucose blood (FREESTYLE INSULINX TEST) test strip, USE 1 STRIP TO  CHECK GLUCOSE ONCE DAILY DX-E11.9 .  glucose monitoring kit (FREESTYLE) monitoring kit, check blood sugar 1 time daily-DX-E11.22. Marland Kitchen  Lancets (FREESTYLE) lancets, Check blood sugar 1 time daily-DX-E11.22. .  magnesium oxide (MAG-OX) 400 MG tablet, Take 400 mg by mouth daily. .  Multiple Vitamins-Minerals (MULTIVITAMIN WITH MINERALS) tablet, Take 1 tablet by mouth daily.   Marland Kitchen  VITAMIN D PO, Take 5,000 Units by mouth 2 (two) times daily.  ROS- see HPI  Physical Exam: Blood pressure 122/86, pulse 84, temperature 97.9 F (36.6 C), weight 246 lb (111.6 kg), SpO2 96 %. Body mass index is 36.33  kg/m. General Appearance: Well nourished, in no apparent distress. Eyes: PERRLA, EOMs, conjunctiva no swelling or erythema ENT/Mouth: Ext aud canals clear, TMs without erythema, bulging. No erythema, swelling, or exudate on post pharynx.  Tonsils not swollen or erythematous. Hearing normal.  Respiratory: Respiratory effort normal, BS equal bilaterally without rales, rhonchi, wheezing or stridor.  Cardio: RRR with no MRGs. Brisk peripheral pulses without edema.  Abdomen: Soft, + BS.  Non tender, no guarding, rebound, hernias, masses. Musculoskeletal: Full ROM, 5/5 strength, normal gait.  Skin: Warm, dry without rashes, lesions, ecchymosis.  Neuro: Cranial nerves intact. Normal muscle tone, no cerebellar symptoms. Sensation intact.    Plan and Assessment: Diabetes Education: Reviewed 'ABCs' of diabetes management (respective goals in parentheses):  A1C (<7), blood pressure (<130/80), and cholesterol (LDL <70) Eye Exam yearly and Dental Exam every 6 months. Dietary recommendations Physical Activity recommendations - Strongly advised him to start checking sugars at different times of the day - check 2 times a day, rotating checks - may need to switch from amaryl to insulin, and may need to change pravastatin to crestor - patient is very confident that he can get to 230 and get sugars better controlled with diet If not better next OV will switch from PO to insulin  Go to the ER if any chest pain, shortness of breath, nausea, dizziness, severe HA, changes vision/speech  Patient would like COVID vaccine for him and his wife  Future Appointments  Date Time Provider [place]  11/24/2019  9:30 AM Unk Pinto, MD [facility] None  03/05/2020  8:45 AM Liane Comber, NP [facility] None  06/21/2020 10:00 AM Garnet Sierras, NP [facility] None  09/20/2020  9:00 AM Liane Comber, NP [facility] None

## 2019-09-27 ENCOUNTER — Ambulatory Visit: Payer: Medicare Other | Admitting: Adult Health

## 2019-09-27 ENCOUNTER — Other Ambulatory Visit: Payer: Self-pay

## 2019-09-27 ENCOUNTER — Encounter: Payer: Self-pay | Admitting: Physician Assistant

## 2019-09-27 ENCOUNTER — Ambulatory Visit (INDEPENDENT_AMBULATORY_CARE_PROVIDER_SITE_OTHER): Payer: Medicare Other | Admitting: Physician Assistant

## 2019-09-27 VITALS — BP 122/86 | HR 84 | Temp 97.9°F | Wt 246.0 lb

## 2019-09-27 DIAGNOSIS — E1169 Type 2 diabetes mellitus with other specified complication: Secondary | ICD-10-CM | POA: Diagnosis not present

## 2019-09-27 DIAGNOSIS — E1121 Type 2 diabetes mellitus with diabetic nephropathy: Secondary | ICD-10-CM | POA: Diagnosis not present

## 2019-09-27 DIAGNOSIS — E785 Hyperlipidemia, unspecified: Secondary | ICD-10-CM

## 2019-09-27 DIAGNOSIS — E1122 Type 2 diabetes mellitus with diabetic chronic kidney disease: Secondary | ICD-10-CM

## 2019-09-27 DIAGNOSIS — Z6836 Body mass index (BMI) 36.0-36.9, adult: Secondary | ICD-10-CM

## 2019-09-27 DIAGNOSIS — I700 Atherosclerosis of aorta: Secondary | ICD-10-CM

## 2019-09-27 DIAGNOSIS — N183 Chronic kidney disease, stage 3 unspecified: Secondary | ICD-10-CM

## 2019-09-27 DIAGNOSIS — E1142 Type 2 diabetes mellitus with diabetic polyneuropathy: Secondary | ICD-10-CM | POA: Diagnosis not present

## 2019-09-27 DIAGNOSIS — N1832 Chronic kidney disease, stage 3b: Secondary | ICD-10-CM

## 2019-09-27 NOTE — Patient Instructions (Addendum)
Want the morning number under 180 and as close to 150 as you can get.  If your number is not better will suggest switching to insulin Goal weight is 230  General eating tips  What to Avoid . Avoid added sugars o Often added sugar can be found in processed foods such as many condiments, dry cereals, cakes, cookies, chips, crisps, crackers, candies, sweetened drinks, etc.  o Read labels and AVOID/DECREASE use of foods with the following in their ingredient list: Sugar, fructose, high fructose corn syrup, sucrose, glucose, maltose, dextrose, molasses, cane sugar, brown sugar, any type of syrup, agave nectar, etc.   . Avoid snacking in between meals- drink water or if you feel you need a snack, pick a high water content snack such as cucumbers, watermelon, or any veggie.  Marland Kitchen Avoid foods made with flour o If you are going to eat food made with flour, choose those made with whole-grains; and, minimize your consumption as much as is tolerable . Avoid processed foods o These foods are generally stocked in the middle of the grocery store.  o Focus on shopping on the perimeter of the grocery.  What to Include . Vegetables o GREEN LEAFY VEGETABLES: Kale, spinach, mustard greens, collard greens, cabbage, broccoli, etc. o OTHER: Asparagus, cauliflower, eggplant, carrots, peas, Brussel sprouts, tomatoes, bell peppers, zucchini, beets, cucumbers, etc. . Grains, seeds, and legumes o Beans: kidney beans, black eyed peas, garbanzo beans, black beans, pinto beans, etc. o Whole, unrefined grains: brown rice, barley, bulgur, oatmeal, etc. . Healthy fats  o Avoid highly processed fats such as vegetable oil o Examples of healthy fats: avocado, olives, virgin olive oil, dark chocolate (?72% Cocoa), nuts (peanuts, almonds, walnuts, cashews, pecans, etc.) o Please still do small amount of these healthy fats, they are dense in calories.  . Low - Moderate Intake of Animal Sources of Protein o Meat sources: chicken,  [place], salmon, tuna. Limit to 4 ounces of meat at one time or the size of your palm. o Consider limiting dairy sources, but when choosing dairy focus on: PLAIN [place] yogurt, cottage cheese, high-protein milk . Fruit o Choose berries

## 2019-09-29 ENCOUNTER — Other Ambulatory Visit: Payer: Self-pay | Admitting: Internal Medicine

## 2019-09-29 DIAGNOSIS — I10 Essential (primary) hypertension: Secondary | ICD-10-CM

## 2019-09-29 DIAGNOSIS — N401 Enlarged prostate with lower urinary tract symptoms: Secondary | ICD-10-CM

## 2019-09-29 DIAGNOSIS — N138 Other obstructive and reflux uropathy: Secondary | ICD-10-CM

## 2019-09-29 MED ORDER — DOXAZOSIN MESYLATE 8 MG PO TABS: ORAL_TABLET | ORAL | 0 refills | Status: DC

## 2019-10-15 ENCOUNTER — Ambulatory Visit: Payer: Medicare Other

## 2019-10-15 ENCOUNTER — Ambulatory Visit: Payer: Medicare Other | Attending: Internal Medicine

## 2019-10-15 DIAGNOSIS — Z23 Encounter for immunization: Secondary | ICD-10-CM | POA: Insufficient documentation

## 2019-10-15 NOTE — Progress Notes (Signed)
   Covid-19 Vaccination Clinic  Name:  Edwin Osborne    MRN: [id number] DOB: Apr 10, 1935  10/15/2019  Mr. Reasons was observed post Covid-19 immunization for 15 minutes without incidence. He was provided with Vaccine Information Sheet and instruction to access the V-Safe system.   Mr. [name] was instructed to call 911 with any severe reactions post vaccine: Marland Kitchen Difficulty breathing  . Swelling of your face and throat  . A fast heartbeat  . A bad rash all over your body  . Dizziness and weakness    Immunizations Administered    Name Date Dose VIS Date Route   Pfizer COVID-19 Vaccine 10/15/2019  2:41 PM 0.3 mL 09/02/2019 Intramuscular   Manufacturer: [place]   Lot: [id number]   [place]: [id number]

## 2019-11-06 ENCOUNTER — Ambulatory Visit: Payer: Medicare Other | Attending: Internal Medicine

## 2019-11-06 DIAGNOSIS — Z23 Encounter for immunization: Secondary | ICD-10-CM | POA: Insufficient documentation

## 2019-11-06 NOTE — Progress Notes (Signed)
   Covid-19 Vaccination Clinic  Name:  Edwin Osborne    MRN: [id number] DOB: 1935-04-30  11/06/2019  [name] was observed post Covid-19 immunization for 15 minutes without incidence. He was provided with Vaccine Information Sheet and instruction to access the V-Safe system.   [name] was instructed to call 911 with any severe reactions post vaccine: Marland Kitchen Difficulty breathing  . Swelling of your face and throat  . A fast heartbeat  . A bad rash all over your body  . Dizziness and weakness    Immunizations Administered    Name Date Dose VIS Date Route   Pfizer COVID-19 Vaccine 11/06/2019 10:30 AM 0.3 mL 09/02/2019 Intramuscular   Manufacturer: [place]   Lot: [id number]   [place]: [id number]

## 2019-11-09 ENCOUNTER — Other Ambulatory Visit: Payer: Self-pay | Admitting: Internal Medicine

## 2019-11-09 DIAGNOSIS — I10 Essential (primary) hypertension: Secondary | ICD-10-CM

## 2019-11-09 MED ORDER — HYDROCHLOROTHIAZIDE 25 MG PO TABS: ORAL_TABLET | ORAL | 1 refills | Status: DC

## 2019-11-23 ENCOUNTER — Encounter: Payer: Self-pay | Admitting: Internal Medicine

## 2019-11-23 NOTE — Patient Instructions (Signed)

## 2019-11-23 NOTE — Progress Notes (Signed)
     History of Present Illness:       This very nice 85 y.o. MWM  presents for 6 month follow up with HTN, HLD, T2_NIDDM and Vitamin D Deficiency.  Patient has hx/o GERD controlled on his meds.      Patient is treated for HTN circa 1985 & BP has been controlled at home. Today's BP is at goal - 130/66. Patient has had no complaints of any cardiac type chest pain, palpitations, dyspnea / orthopnea / PND, dizziness, claudication, or dependent edema.      Hyperlipidemia is controlled with diet & Pravastatin. Patient denies myalgias or other med SE's. Last Lipids were at goal except elevated Trig's:   Lab Results  Component Value Date   CHOL 156 06/21/2019   HDL 37 (L) 06/21/2019   LDLCALC 76 06/21/2019   TRIG 353 (H) 06/21/2019   CHOLHDL 4.2 06/21/2019    Also, the patient has Morbid Obesity (BMI 35+) and consequent T2_NIDDM circa 1999. He has CKD3 and this past year Metformin was d/c'd with declining GFR and he was switched to Glimipiride and has had no symptoms of reactive hypoglycemia, diabetic polys, paresthesias or visual blurring.  Last A1c was not at goal:  Lab Results  Component Value Date   HGBA1C 10.4 (H) 06/21/2019       Further, the patient also has history of Vitamin D Deficiency ("32" / 2008) and supplements vitamin D without any suspected side-effects. Last vitamin D was at goal:  Lab Results  Component Value Date   VD25OH 77 06/21/2019    Current Outpatient Medications on File Prior to Visit  Medication Sig  . aspirin EC 81 MG tablet Take 81 mg by mouth daily.  . Cinnamon 500 MG TABS Take 1,000 mg by mouth.  . doxazosin (CARDURA) 8 MG tablet Take 1 tablet at Bedtime for BP & Prostate  . famotidine (PEPCID) 40 MG tablet Take 1 tablet Daily for Indigestion & Heartburn  . glimepiride (AMARYL) 4 MG tablet Start taking 1 tab daily with breakfast, and if fasting glucose remains 150+ in the morning, take 1/2 tab with dinner. Only take if you are eating.  . glucose  blood (FREESTYLE INSULINX TEST) test strip USE 1 STRIP TO CHECK GLUCOSE ONCE DAILY DX-E11.9  . glucose monitoring kit (FREESTYLE) monitoring kit check blood sugar 1 time daily-DX-E11.22.  . hydrochlorothiazide (HYDRODIURIL) 25 MG tablet Take 1 tablet Daily for BP & Fluid / Swelling  . Lancets (FREESTYLE) lancets Check blood sugar 1 time daily-DX-E11.22.  . lisinopril (ZESTRIL) 40 MG tablet Take 1 tablet Daily for BP & Kidney Protection  . magnesium oxide (MAG-OX) 400 MG tablet Take 400 mg by mouth daily.  . Multiple Vitamins-Minerals (MULTIVITAMIN WITH MINERALS) tablet Take 1 tablet by mouth daily.    . pravastatin (PRAVACHOL) 40 MG tablet TAKE 1 TABLET BY MOUTH AT BEDTIME FOR CHOLESTEROL  . VITAMIN D PO Take 5,000 Units by mouth 2 (two) times daily.   No current facility-administered medications on file prior to visit.    No Known Allergies  PMHx:   Past Medical History:  Diagnosis Date  . Arthritis   . BPH (benign prostatic hyperplasia)   . Cataract   . CKD (chronic kidney disease), stage III   . Depression    "usually a happy go lucky"  . Diabetes mellitus   . Diverticulosis   . GERD (gastroesophageal reflux disease)    sometimes will take omeprazole  . Hyperlipidemia   . Hypertension       Immunization History  Administered Date(s) Administered  . DT (Pediatric) 08/31/2015  . Influenza Whole 05/24/2011, 08/06/2012, 07/07/2013  . Influenza, High Dose Seasonal PF 06/21/2014, 08/31/2015, 06/03/2016, 07/15/2017, 07/29/2018, 07/04/2019  . PFIZER SARS-COV-2 Vaccination 10/15/2019, 11/06/2019  . Pneumococcal Conjugate-13 06/21/2014  . Pneumococcal Polysaccharide-23 03/10/2005    Past Surgical History:  Procedure Laterality Date  . CATARACT EXTRACTION, BILATERAL Bilateral 2020   Dr. Einar Gip  . CHOLECYSTECTOMY N/A 11/29/2015   Procedure: LAPAROSCOPIC CHOLECYSTECTOMY WITH INTRAOPERATIVE CHOLANGIOGRAM;  Surgeon: Erroll Luna, MD;  Location: [facility] OR;  Service: General;   Laterality: N/A;  . COLONOSCOPY  2005, 08/21/11   2005 tubulovillous adenoma and adenomas 2012 - 3 adenomas, largest 10 mm, diverticulosis  . FRACTURE SURGERY     ? right foot as a child  . KNEE ARTHROSCOPY    . TONSILLECTOMY AND ADENOIDECTOMY    . VEIN LIGATION AND STRIPPING     left    FHx:    Reviewed / unchanged  SHx:    Reviewed / unchanged   Systems Review:  Constitutional: Denies fever, chills, wt changes, headaches, insomnia, fatigue, night sweats, change in appetite. Eyes: Denies redness, blurred vision, diplopia, discharge, itchy, watery eyes.  ENT: Denies discharge, congestion, post nasal drip, epistaxis, sore throat, earache, hearing loss, dental pain, tinnitus, vertigo, sinus pain, snoring.  CV: Denies chest pain, palpitations, irregular heartbeat, syncope, dyspnea, diaphoresis, orthopnea, PND, claudication or edema. Respiratory: denies cough, dyspnea, DOE, pleurisy, hoarseness, laryngitis, wheezing.  Gastrointestinal: Denies dysphagia, odynophagia, heartburn, reflux, water brash, abdominal pain or cramps, nausea, vomiting, bloating, diarrhea, constipation, hematemesis, melena, hematochezia  or hemorrhoids. Genitourinary: Denies dysuria, frequency, urgency, nocturia, hesitancy, discharge, hematuria or flank pain. Musculoskeletal: Denies arthralgias, myalgias, stiffness, jt. swelling, pain, limping or strain/sprain.  Skin: Denies pruritus, rash, hives, warts, acne, eczema or change in skin lesion(s). Neuro: No weakness, tremor, incoordination, spasms, paresthesia or pain. Psychiatric: Denies confusion, memory loss or sensory loss. Endo: Denies change in weight, skin or hair change.  Heme/Lymph: No excessive bleeding, bruising or enlarged lymph nodes.  Physical Exam  BP 130/66   Pulse 64   Temp (!) 97.2 F (36.2 C)   Resp 16   Ht 5' 9" (1.753 m)   Wt 248 lb 12.8 oz (112.9 kg)   BMI 36.74 kg/m   Appears  well nourished, well groomed  and in no distress.  Eyes:  PERRLA, EOMs, conjunctiva no swelling or erythema. Sinuses: No frontal/maxillary tenderness ENT/Mouth: EAC's clear, TM's nl w/o erythema, bulging. Nares clear w/o erythema, swelling, exudates. Oropharynx clear without erythema or exudates. Oral hygiene is good. Tongue normal, non obstructing. Hearing intact.  Neck: Supple. Thyroid not palpable. Car 2+/2+ without bruits, nodes or JVD. Chest: Respirations nl with BS clear & equal w/o rales, rhonchi, wheezing or stridor.  Cor: Heart sounds normal w/ regular rate and rhythm without sig. murmurs, gallops, clicks or rubs. Peripheral pulses normal and equal  without edema.  Abdomen: Soft & bowel sounds normal. Non-tender w/o guarding, rebound, hernias, masses or organomegaly.  Lymphatics: Unremarkable.  Musculoskeletal: Full ROM all peripheral extremities, joint stability, 5/5 strength and normal gait.  Skin: Warm, dry without exposed rashes, lesions or ecchymosis apparent.  Neuro: Cranial nerves intact, reflexes equal bilaterally. Sensory-motor testing grossly intact. Tendon reflexes grossly intact.  Pysch: Alert & oriented x 3.  Insight and judgement nl & appropriate. No ideations.  Assessment and Plan:  1. Essential hypertension  - Continue medication, monitor blood pressure at home.  - Continue DASH diet.  Reminder to go  to the ER if any CP,  SOB, nausea, dizziness, severe HA, changes vision/speech.  - CBC with Differential/Platelet - COMPLETE METABOLIC PANEL WITH GFR - Magnesium - TSH  2. Hyperlipidemia associated with type 2 diabetes mellitus ([place])  - Continue diet/meds, exercise,& lifestyle modifications.  - Continue monitor periodic cholesterol/liver & renal functions   - Lipid panel - TSH  3. Type 2 diabetes mellitus with stage 3b chronic kidney disease,  without long-term current use of insulin (HCC)  - Continue diet, exercise  - Lifestyle modifications.  - Monitor appropriate labs.  - Hemoglobin A1c - Insulin,  random  4. Vitamin D deficiency  - Continue supplementation.  - VITAMIN D 25 Hydroxy  5. Medication management  - CBC with Differential/Platelet - COMPLETE METABOLIC PANEL WITH GFR - Magnesium - Lipid panel - TSH - Hemoglobin A1c - Insulin, random - VITAMIN D 25 Hydroxy        Discussed  regular exercise, BP monitoring, weight control to achieve/maintain BMI less than 25 and discussed med and SE's. Recommended labs to assess and monitor clinical status with further disposition pending results of labs.  I discussed the assessment and treatment plan with the patient. The patient was provided an opportunity to ask questions and all were answered. The patient agreed with the plan and demonstrated an understanding of the instructions.  I provided over 30 minutes of exam, counseling, chart review and  complex critical decision making.   Kirtland Bouchard, MD

## 2019-11-24 ENCOUNTER — Other Ambulatory Visit: Payer: Self-pay

## 2019-11-24 ENCOUNTER — Ambulatory Visit (INDEPENDENT_AMBULATORY_CARE_PROVIDER_SITE_OTHER): Payer: Medicare Other | Admitting: Internal Medicine

## 2019-11-24 VITALS — BP 130/66 | HR 64 | Temp 97.2°F | Resp 16 | Ht 69.0 in | Wt 248.8 lb

## 2019-11-24 DIAGNOSIS — E1169 Type 2 diabetes mellitus with other specified complication: Secondary | ICD-10-CM | POA: Diagnosis not present

## 2019-11-24 DIAGNOSIS — N1832 Chronic kidney disease, stage 3b: Secondary | ICD-10-CM

## 2019-11-24 DIAGNOSIS — E1122 Type 2 diabetes mellitus with diabetic chronic kidney disease: Secondary | ICD-10-CM

## 2019-11-24 DIAGNOSIS — I10 Essential (primary) hypertension: Secondary | ICD-10-CM

## 2019-11-24 DIAGNOSIS — E1121 Type 2 diabetes mellitus with diabetic nephropathy: Secondary | ICD-10-CM | POA: Diagnosis not present

## 2019-11-24 DIAGNOSIS — E785 Hyperlipidemia, unspecified: Secondary | ICD-10-CM

## 2019-11-24 DIAGNOSIS — Z79899 Other long term (current) drug therapy: Secondary | ICD-10-CM

## 2019-11-24 DIAGNOSIS — E559 Vitamin D deficiency, unspecified: Secondary | ICD-10-CM | POA: Diagnosis not present

## 2019-11-25 ENCOUNTER — Other Ambulatory Visit: Payer: Self-pay | Admitting: *Deleted

## 2019-11-25 ENCOUNTER — Encounter: Payer: Self-pay | Admitting: *Deleted

## 2019-11-25 LAB — CBC WITH DIFFERENTIAL/PLATELET
Absolute Monocytes: 714 cells/uL (ref 200–950)
Basophils Absolute: 48 cells/uL (ref 0–200)
Basophils Relative: 0.7 %
Eosinophils Absolute: 272 cells/uL (ref 15–500)
Eosinophils Relative: 4 %
HCT: 42.1 % (ref 38.5–50.0)
Hemoglobin: 14.4 g/dL (ref 13.2–17.1)
Lymphs Abs: 1421 cells/uL (ref 850–3900)
MCH: 30.1 pg (ref 27.0–33.0)
MCHC: 34.2 g/dL (ref 32.0–36.0)
MCV: 87.9 fL (ref 80.0–100.0)
MPV: 11.2 fL (ref 7.5–12.5)
Monocytes Relative: 10.5 %
Neutro Abs: 4345 cells/uL (ref 1500–7800)
Neutrophils Relative %: 63.9 %
Platelets: 156 10*3/uL (ref 140–400)
RBC: 4.79 10*6/uL (ref 4.20–5.80)
RDW: 12.8 % (ref 11.0–15.0)
Total Lymphocyte: 20.9 %
WBC: 6.8 10*3/uL (ref 3.8–10.8)

## 2019-11-25 LAB — LIPID PANEL
Cholesterol: 154 mg/dL (ref <200)
HDL: 36 mg/dL — ABNORMAL LOW (ref >=40)
LDL Cholesterol (Calc): 87 mg/dL (calc)
Non-HDL Cholesterol (Calc): 118 mg/dL (calc) (ref <130)
Total CHOL/HDL Ratio: 4.3 (calc) (ref <5.0)
Triglycerides: 222 mg/dL — ABNORMAL HIGH (ref <150)

## 2019-11-25 LAB — HEMOGLOBIN A1C
Hgb A1c MFr Bld: 9.6 % of total Hgb — ABNORMAL HIGH (ref <5.7)
Mean Plasma Glucose: 229 (calc)
eAG (mmol/L): 12.7 (calc)

## 2019-11-25 LAB — COMPLETE METABOLIC PANEL WITH GFR
AG Ratio: 1.9 (calc) (ref 1.0–2.5)
ALT: 14 U/L (ref 9–46)
AST: 13 U/L (ref 10–35)
Albumin: 4.1 g/dL (ref 3.6–5.1)
Alkaline phosphatase (APISO): 57 U/L (ref 35–144)
BUN/Creatinine Ratio: 16 (calc) (ref 6–22)
BUN: 29 mg/dL — ABNORMAL HIGH (ref 7–25)
CO2: 28 mmol/L (ref 20–32)
Calcium: 9.3 mg/dL (ref 8.6–10.3)
Chloride: 102 mmol/L (ref 98–110)
Creat: 1.81 mg/dL — ABNORMAL HIGH (ref 0.70–1.11)
GFR, Est African American: 39 mL/min/{1.73_m2} — ABNORMAL LOW (ref >=60)
GFR, Est Non African American: 34 mL/min/{1.73_m2} — ABNORMAL LOW (ref >=60)
Globulin: 2.2 g/dL (calc) (ref 1.9–3.7)
Glucose, Bld: 209 mg/dL — ABNORMAL HIGH (ref 65–99)
Potassium: 4.6 mmol/L (ref 3.5–5.3)
Sodium: 138 mmol/L (ref 135–146)
Total Bilirubin: 0.6 mg/dL (ref 0.2–1.2)
Total Protein: 6.3 g/dL (ref 6.1–8.1)

## 2019-11-25 LAB — TSH: TSH: 2.17 mIU/L (ref 0.40–4.50)

## 2019-11-25 LAB — MAGNESIUM: Magnesium: 1.9 mg/dL (ref 1.5–2.5)

## 2019-11-25 LAB — INSULIN, RANDOM: Insulin: 20.4 u[IU]/mL — ABNORMAL HIGH

## 2019-11-25 LAB — VITAMIN D 25 HYDROXY (VIT D DEFICIENCY, FRACTURES): Vit D, 25-Hydroxy: 80 ng/mL (ref 30–100)

## 2019-11-25 MED ORDER — ONETOUCH ULTRA 2 W/DEVICE KIT: PACK | 0 refills | Status: DC

## 2019-11-25 MED ORDER — ONETOUCH ULTRASOFT LANCETS MISC: 3 refills | Status: AC

## 2019-11-25 MED ORDER — ONETOUCH ULTRA VI STRP: ORAL_STRIP | 3 refills | Status: DC

## 2019-11-30 ENCOUNTER — Other Ambulatory Visit: Payer: Self-pay | Admitting: Adult Health

## 2020-01-05 ENCOUNTER — Other Ambulatory Visit: Payer: Self-pay | Admitting: Internal Medicine

## 2020-01-05 DIAGNOSIS — I10 Essential (primary) hypertension: Secondary | ICD-10-CM

## 2020-01-05 DIAGNOSIS — N138 Other obstructive and reflux uropathy: Secondary | ICD-10-CM

## 2020-01-05 DIAGNOSIS — N401 Enlarged prostate with lower urinary tract symptoms: Secondary | ICD-10-CM

## 2020-01-05 MED ORDER — DOXAZOSIN MESYLATE 8 MG PO TABS: ORAL_TABLET | ORAL | 1 refills | Status: DC

## 2020-01-10 DIAGNOSIS — J020 Streptococcal pharyngitis: Secondary | ICD-10-CM | POA: Diagnosis not present

## 2020-01-10 DIAGNOSIS — J069 Acute upper respiratory infection, unspecified: Secondary | ICD-10-CM | POA: Diagnosis not present

## 2020-03-01 NOTE — Progress Notes (Deleted)
3 MONTH FOLLOW UP  Assessment and Plan:    Atherosclerosis of aorta Per CT 2018 Control blood pressure, cholesterol, glucose, increase exercise.   Hypertension At goal; continue medications Monitor blood pressure at home. Continue DASH diet.   Reminder to go to the ER if any CP, SOB, nausea, dizziness, severe HA, changes vision/speech, left arm numbness and tingling and jaw pain.  Cholesterol Continue medication: pravastatin 40 mg  LDL goal <70 Continue diet and exercise.  Check lipid panel.   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  Check A1C q30m Morbid Obesity - BMI 35+  with co morbidities (T2DM, htn, hyperlipidemia)  Long discussion about weight loss, diet, and exercise Discussed ideal weight for height (below 176) and initial weight goal (245) Patient will work on increasing vegetables and increase length and frequency of exercise Will follow up in 3 months  Vitamin D Def At goal at recent check; continue to recommend supplementation for goal of 70-100 Defer vitamin D level  Stage 3 chronic kidney disease due to type 2 diabetes mellitus (HCC) Increase fluids, avoid NSAIDS, monitor sugars, will monitor Encouraged hydration 80+ oz of water daily  Diabetic [place]uropathy [place] No wounds at this time; has had recent diabetic foot exam  Reminded patient to check feet daily for wounds Patient denies podiatry referral today but will consider in the future  History of colonic polyps Patient refuses follow up colonoscopy, discussed cologuard which patient refuses at this time, will continue to recommend, also declines hemoccult despite strong recommendation Continue ASA  daily  Non compliance with medical treatment Discussed barriers, patient willing to work on diet, will switch glipizide to glimepiride   Chronic bilateral knee pain Bone on bone per patient, establised with Dr. DEverardo Alltylenol/topicals only for pain due to kidney functions He will follow up with ortho to discuss due to progressive sx limiting mobility Advised he wear bilateral knee sleeve supports for stability  Advised needs to lose weight and improve glucose prior to any procedures    No orders of the defined types were placed in this encounter.    Continue diet and meds as discussed. Further disposition pending results of labs. Discussed med's effects and SE's.   Over 30 minutes of exam, counseling, chart review, and critical decision making was performed.   Future Appointments  Date Time Provider DPyote 03/05/2020  8:45 AM CLiane Comber NP [facility] None  06/26/2020  9:00 AM MGarnet Sierras NP [facility] None  09/20/2020  9:00 AM CLiane Comber NP [facility] None    HPI 85y.o. male  presents for 6 week follow up on hypertension, poorly controlled diabetes with CKD and neuropathy, morbid obesity.    He has bil knee pain, has seen Dr. DMarlou Sabut several years ago, bothering him more and plans to follow up.    BMI is There is no height or weight on file to calculate BMI., he has not been working on diet, no exercise due to knee pain, can't even tolerate stationary cycle. He gained weight and wife has put him back on diet, she fixes his plate.  Wt Readings from Last 3 Encounters:  11/24/19 248 lb 12.8 oz (112.9 kg)  09/27/19 246 lb (111.6 kg)  08/15/19 251 lb 9.6 oz (114.1 kg)   He has aortic atherosclerosis per CT abd 2018 He does not check his BP at home, today their BP is     He does workout. He denies chest pain, shortness of breath, dizziness.    He is on cholesterol medication (pravastatin 40 mg daily) and denies myalgias. His cholesterol is not  at goal. The cholesterol last visit was:   Lab Results  Component Value Date   CHOL 154 11/24/2019   HDL 36 (L) 11/24/2019   LDLCALC 87 11/24/2019   TRIG 222 (H) 11/24/2019   CHOLHDL 4.3 11/24/2019    He has not been working on diet and exercise for DMII and has CKD stage 3 P neuropathy *** Hyperlipidemia on pravastatin On ASA, ACEi Off metformin due to GFR <45 Stopped glipizide and started glimepiride 4 mg to assist with med compliance ***  Meter: Freestyle He admits he has not been checking in the past month  Denies hyperglycemia, hypoglycemia , increased appetite, nausea, polydipsia and polyuria.  Last A1C in the office wasl:  Lab Results  Component Value Date   HGBA1C 9.6 (H) 11/24/2019    He has CKD III associated with T2DM monitored via this office:  Lab Results  Component Value Date   GFRNONAA 34 (L) 11/24/2019   Patient is on Vitamin D supplement and at goal:  Lab Results  Component Value Date   VD25OH 80 11/24/2019        Current Medications:  Current Outpatient Medications on File Prior to Visit  Medication Sig   aspirin EC 81 MG tablet Take 81 mg by mouth daily.   Blood Glucose Monitoring Suppl (ONE TOUCH ULTRA 2) w/Device KIT check blood sugar 1 time daily-E11.21   Cinnamon 500 MG TABS Take 1,000 mg by mouth.   doxazosin (CARDURA) 8 MG tablet Take 1 tablet at Bedtime for BP & Prostate   famotidine (PEPCID) 40 MG tablet Take 1 tablet Daily for Indigestion & Heartburn   glimepiride (AMARYL) 4 MG tablet Start taking 1 tab daily with breakfast, and if fasting glucose remains 150+ in the morning, take 1/2 tab with dinner. Only take if you are eating.   glucose blood (ONETOUCH ULTRA) test strip Check blood sugasr 1 time daily-E11.21   hydrochlorothiazide (HYDRODIURIL) 25 MG tablet Take 1 tablet Daily for BP & Fluid / Swelling   Lancets (ONETOUCH ULTRASOFT) lancets Check blood sugar 1 time daily-E11.21   lisinopril (ZESTRIL) 40 MG tablet Take 1 tablet  Daily for BP & Diabetic Kidney Protection   magnesium oxide (MAG-OX) 400 MG tablet Take 400 mg by mouth daily.   Multiple Vitamins-Minerals (MULTIVITAMIN WITH MINERALS) tablet Take 1 tablet by mouth daily.     pravastatin (PRAVACHOL) 40 MG tablet TAKE 1 TABLET BY MOUTH AT BEDTIME FOR CHOLESTEROL   VITAMIN D PO Take 5,000 Units by mouth 2 (two) times daily.   No current facility-administered medications on file prior to visit.    Medical History:  Past Medical History:  Diagnosis Date   Arthritis    BPH (benign prostatic hyperplasia)    Cataract    CKD (chronic kidney disease), stage III    Depression    "usually a happy go lucky"   Diabetes mellitus    Diverticulosis    GERD (gastroesophageal reflux disease)    sometimes will take omeprazole   Hyperlipidemia    Hypertension    Allergies No Known Allergies  SURGICAL HISTORY He  has a past surgical  history that includes Tonsillectomy and adenoidectomy; Vein ligation and stripping; Knee arthroscopy; Colonoscopy (2005, 08/21/11); Fracture surgery; Cholecystectomy (N/A, 11/29/2015); and Cataract extraction, bilateral (Bilateral, 2020). FAMILY HISTORY His family history includes Diabetes in his brother; Prostate cancer in his brother. SOCIAL HISTORY He  reports that he has been smoking cigars. He has never used smokeless tobacco. He reports current alcohol use. He reports that he does not use drugs.    Review of Systems:  Review of Systems  Constitutional: Negative for malaise/fatigue and weight loss.  HENT: Negative for hearing loss and tinnitus.   Eyes: Negative for blurred vision and double vision.  Respiratory: Negative for cough, sputum production, shortness of breath and wheezing.   Cardiovascular: Negative for chest pain, palpitations, orthopnea, claudication and leg swelling.  Gastrointestinal: Negative for abdominal pain, blood in stool, constipation, diarrhea, heartburn, melena, nausea and vomiting.   Genitourinary: Negative.   Musculoskeletal: Positive for joint pain. Negative for falls and myalgias. Back pain: bil knees bone on bone   Skin: Negative for rash.  Neurological: Positive for tingling (Intermittent in bilateral feet). Negative for dizziness, sensory change, weakness and headaches.  Endo/Heme/Allergies: Negative for polydipsia.  Psychiatric/Behavioral: Negative.  Negative for depression and memory loss. The patient is not nervous/anxious and does not have insomnia.   All other systems reviewed and are negative.   Physical Exam: There were no vitals taken for this visit. Wt Readings from Last 3 Encounters:  11/24/19 248 lb 12.8 oz (112.9 kg)  09/27/19 246 lb (111.6 kg)  08/15/19 251 lb 9.6 oz (114.1 kg)   General Appearance: Well nourished, obese, in no apparent distress. Eyes: PERRLA, EOMs, conjunctiva no swelling or erythema Sinuses: No Frontal/maxillary tenderness ENT/Mouth: Ext aud canals clear, L TMs without erythema, bulging, R TM with extensive scarring, erythematous around bony structures (per the patient he has chronic drainage issue that has been cleared by ENT). No erythema, swelling, or exudate on post pharynx.  Tonsils not swollen or erythematous. Hearing normal.  Neck: Supple, thyroid normal.  Respiratory: Respiratory effort normal, BS equal bilaterally without rales, rhonchi, wheezing or stridor.  Cardio: RRR with no MRGs. 1+ peripheral pulses equally with 1+ non-pitting edema bilateral ankles.  Abdomen: Soft, rounded obese abdomen + BS.  Non tender, no guarding, rebound, palpable hernias or masses. Lymphatics: Non tender without lymphadenopathy.  Musculoskeletal: BIl knees with bony enlargement, crepitus, no effusion, 5/5 strength, antalgic gait Skin: Warm, dry without rashes, lesions, ecchymosis.  Neuro: Cranial nerves intact. No cerebellar symptoms. Sensation in heels diminished bilaterally to monofilament testing.  Psych: Awake and oriented X 3, normal  affect, Insight and Judgment appropriate.     Izora Ribas, NP 1:37 PM [facility]

## 2020-03-05 ENCOUNTER — Ambulatory Visit: Payer: Medicare Other | Admitting: Adult Health

## 2020-03-09 ENCOUNTER — Other Ambulatory Visit: Payer: Self-pay | Admitting: Adult Health

## 2020-03-21 NOTE — Progress Notes (Signed)
3 MONTH FOLLOW UP  Assessment and Plan:    Atherosclerosis of aorta Per CT 2018 Control blood pressure, cholesterol, glucose, increase exercise.   Hypertension At goal; continue medications Monitor blood pressure at home. Continue DASH diet.   Reminder to go to the ER if any CP, SOB, nausea, dizziness, severe HA, changes vision/speech, left arm numbness and tingling and jaw pain.  Cholesterol Increase pravastatin to 40 mg daily  LDL goal <70 Continue diet and exercise.  Check lipid panel.   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  Check A1C q32m  Morbid Obesity - BMI 35+  with co morbidities (T2DM, htn, hyperlipidemia)  Long discussion about weight loss, diet, and exercise Discussed ideal weight for height (below 176) and initial weight goal (225) Patient will work on increasing vegetables and increase length and frequency of exercise Will follow up in 3 months  Vitamin D Def At goal at recent check; continue to recommend supplementation for goal of 70-100 Defer vitamin D level  Stage 3 chronic kidney disease due to type 2 diabetes mellitus (HCC) Increase fluids, avoid NSAIDS, monitor sugars, will monitor Encouraged hydration 65+ oz of water daily  Diabetic sensory polyneuropathy (HCC) No wounds at this time;  Reminded patient to check feet daily for wounds Patient denies podiatry referral   Non compliance with medical treatment Discussed barriers, patient willing to work on diet, and weight loss - close follow up 1 month   Chronic bilateral knee pain Bone on bone per patient, establised with Dr. Shanon Payor tylenol/topicals only for pain  due to kidney functions Advised he wear bilateral knee sleeve supports for stability  Advised needs to lose weight and improve glucose prior to any procedures - will work on this However he reports is managing fairly at this time    No orders of the defined types were placed in this encounter.    Continue diet and meds as discussed. Further disposition pending results of labs. Discussed med's effects and SE's.   Over 30 minutes of exam, counseling, chart review, and critical decision making was performed.   Future Appointments  Date Time Provider Department Center  06/26/2020  9:00 AM Elder Negus, NP [facility] None  09/20/2020  9:00 AM Judd Gaudier, NP [facility] None    HPI 85 y.o. male  presents for 6 week follow up on hypertension, poorly controlled diabetes with CKD and neuropathy, morbid obesity.    He has bil knee pain, has seen Dr. August Saucer but several years ago, bothering him more but wearing a brace and doing OTC voltaren.   BMI is Body mass index is 36.92 kg/m., he has not been working on diet, no exercise due to knee pain. He gained weight, has done well in the past if he has his wife fix his plate, but admits he really likes to eat, knows he needs to do better, has been eating more sweets in the past. Is eating more veggies in the summer.  Wt Readings from Last 3 Encounters:  03/23/20 250 lb (113.4 kg)  11/24/19 248 lb 12.8 oz (112.9 kg)  09/27/19 246 lb (111.6 kg)   He has aortic atherosclerosis per CT abd 2018 He does not  check his BP at home, today their BP is BP: 118/74   He does workout. He denies chest pain, shortness of breath, dizziness.    He is on cholesterol medication (pravastatin 20 mg daily) and denies myalgias. His cholesterol is not at goal. The cholesterol last visit was:   Lab Results  Component Value Date   CHOL 154 11/24/2019   HDL 36 (L) 11/24/2019   LDLCALC 87 11/24/2019   TRIG 222 (H) 11/24/2019   CHOLHDL 4.3 11/24/2019    He has not  been working on diet and exercise for DMII and has CKD stage 3 P neuropathy - decreased sensation without pain Hyperlipidemia on pravastatin On ASA, ACEi Off metformin due to GFR <45 Stopped glipizide and started glimepiride 4 mg to assist with med compliance, taking twice daily  Meter: Freestyle He admits hasn't been checking glucose frequently - when he checks, fasting is reportedly 200 or so Denies hyperglycemia, hypoglycemia , increased appetite, nausea, polydipsia and polyuria.  Last A1C in the office wasl:  Lab Results  Component Value Date   HGBA1C 9.6 (H) 11/24/2019    He has CKD III associated with T2DM monitored via this office:  Lab Results  Component Value Date   GFRNONAA 34 (L) 11/24/2019   GFRNONAA 32 (L) 08/15/2019   GFRNONAA 31 (L) 06/21/2019   Patient is on Vitamin D supplement and at goal:  Lab Results  Component Value Date   VD25OH 80 11/24/2019        Current Medications:  Current Outpatient Medications on File Prior to Visit  Medication Sig  . aspirin EC 81 MG tablet Take 81 mg by mouth daily.  . Blood Glucose Monitoring Suppl (ONE TOUCH ULTRA 2) w/Device KIT check blood sugar 1 time daily-E11.21  . Cinnamon 500 MG TABS Take 1,000 mg by mouth.  . doxazosin (CARDURA) 8 MG tablet Take 1 tablet at Bedtime for BP & Prostate (Patient taking differently: Take 1/2 tablet at Bedtime for BP & Prostate)  . famotidine (PEPCID) 40 MG tablet Take 1 tablet Daily for Indigestion & Heartburn  . glimepiride (AMARYL) 4 MG tablet TAKE 1 TABLET BY MOUTH WITH BREAKFAST. IF FASTING GLUCOSE REMAINS ABOVE 150 IN THE MORNING TAKE ONE-HALF TABLET WITH DINNER. ONLY TAKE IF EATING.  Marland Kitchen glucose blood (ONETOUCH ULTRA) test strip Check blood sugasr 1 time daily-E11.21  . hydrochlorothiazide (HYDRODIURIL) 25 MG tablet Take 1 tablet Daily for BP & Fluid / Swelling  . Lancets (ONETOUCH ULTRASOFT) lancets Check blood sugar 1 time daily-E11.21  . lisinopril (ZESTRIL) 40 MG tablet Take 1  tablet Daily for BP & Diabetic Kidney Protection  . magnesium oxide (MAG-OX) 400 MG tablet Take 400 mg by mouth daily.  . Multiple Vitamins-Minerals (MULTIVITAMIN WITH MINERALS) tablet Take 1 tablet by mouth daily.    . pravastatin (PRAVACHOL) 40 MG tablet TAKE 1 TABLET BY MOUTH AT BEDTIME FOR CHOLESTEROL  . VITAMIN D PO Take 5,000 Units by mouth 2 (two) times daily.   No current facility-administered medications on file prior to visit.    Medical History:  Past Medical History:  Diagnosis Date  . Arthritis   . BPH (benign prostatic hyperplasia)   . Cataract   . CKD (chronic kidney disease), stage III   . Depression    "usually a happy go lucky"  . Diabetes mellitus   . Diverticulosis   . GERD (gastroesophageal reflux disease)    sometimes will take omeprazole  . Hyperlipidemia   . Hypertension  Allergies No Known Allergies  SURGICAL HISTORY He  has a past surgical history that includes Tonsillectomy and adenoidectomy; Vein ligation and stripping; Knee arthroscopy; Colonoscopy (2005, 08/21/11); Fracture surgery; Cholecystectomy (N/A, 11/29/2015); and Cataract extraction, bilateral (Bilateral, 2020). FAMILY HISTORY His family history includes Diabetes in his brother; Prostate cancer in his brother. SOCIAL HISTORY He  reports that he has been smoking cigars. He has never used smokeless tobacco. He reports current alcohol use. He reports that he does not use drugs.    Review of Systems:  Review of Systems  Constitutional: Negative for malaise/fatigue and weight loss.  HENT: Negative for hearing loss and tinnitus.   Eyes: Negative for blurred vision and double vision.  Respiratory: Negative for cough, sputum production, shortness of breath and wheezing.   Cardiovascular: Negative for chest pain, palpitations, orthopnea, claudication and leg swelling.  Gastrointestinal: Negative for abdominal pain, blood in stool, constipation, diarrhea, heartburn, melena, nausea and vomiting.   Genitourinary: Negative.   Musculoskeletal: Positive for joint pain (bil knees ). Negative for falls and myalgias.  Skin: Negative for rash.  Neurological: Negative for dizziness, tingling, sensory change, weakness and headaches.  Endo/Heme/Allergies: Negative for polydipsia.  Psychiatric/Behavioral: Negative.  Negative for depression and memory loss. The patient is not nervous/anxious and does not have insomnia.   All other systems reviewed and are negative.   Physical Exam: BP 118/74   Pulse 89   Temp (!) 97.3 F (36.3 C)   Wt 250 lb (113.4 kg)   SpO2 95%   BMI 36.92 kg/m  Wt Readings from Last 3 Encounters:  03/23/20 250 lb (113.4 kg)  11/24/19 248 lb 12.8 oz (112.9 kg)  09/27/19 246 lb (111.6 kg)   General Appearance: Well nourished, obese, in no apparent distress. Eyes: PERRLA, EOMs, conjunctiva no swelling or erythema Sinuses: No Frontal/maxillary tenderness ENT/Mouth: Ext aud canals clear, L TMs without erythema, bulging, R TM with extensive scarring, erythematous around bony structures (per the patient he has chronic drainage issue that has been cleared by ENT). No erythema, swelling, or exudate on post pharynx.  Tonsils not swollen or erythematous. Hearing normal.  Neck: Supple, thyroid normal.  Respiratory: Respiratory effort normal, BS equal bilaterally without rales, rhonchi, wheezing or stridor.  Cardio: RRR with no MRGs. 1+ peripheral pulses equally with 1+ non-pitting edema bilateral ankles.  Abdomen: Soft, rounded obese abdomen + BS.  Non tender, no guarding, rebound, palpable hernias or masses. Lymphatics: Non tender without lymphadenopathy.  Musculoskeletal: BIl knees with bony enlargement, crepitus, no effusion, 5/5 strength, antalgic gait Skin: Warm, dry without rashes, lesions, ecchymosis.  Neuro: Cranial nerves intact. No cerebellar symptoms. Sensation in heels diminished bilaterally to monofilament testing.  Psych: Awake and oriented X 3, normal affect,  Insight and Judgment appropriate.     Izora Ribas, NP 9:16 AM [facility]

## 2020-03-23 ENCOUNTER — Encounter: Payer: Self-pay | Admitting: Adult Health

## 2020-03-23 ENCOUNTER — Ambulatory Visit (INDEPENDENT_AMBULATORY_CARE_PROVIDER_SITE_OTHER): Payer: Medicare Other | Admitting: Adult Health

## 2020-03-23 ENCOUNTER — Other Ambulatory Visit: Payer: Self-pay

## 2020-03-23 VITALS — BP 118/74 | HR 89 | Temp 97.3°F | Wt 250.0 lb

## 2020-03-23 DIAGNOSIS — I700 Atherosclerosis of aorta: Secondary | ICD-10-CM | POA: Diagnosis not present

## 2020-03-23 DIAGNOSIS — E1169 Type 2 diabetes mellitus with other specified complication: Secondary | ICD-10-CM

## 2020-03-23 DIAGNOSIS — N1832 Chronic kidney disease, stage 3b: Secondary | ICD-10-CM

## 2020-03-23 DIAGNOSIS — E1121 Type 2 diabetes mellitus with diabetic nephropathy: Secondary | ICD-10-CM | POA: Diagnosis not present

## 2020-03-23 DIAGNOSIS — E1122 Type 2 diabetes mellitus with diabetic chronic kidney disease: Secondary | ICD-10-CM | POA: Diagnosis not present

## 2020-03-23 DIAGNOSIS — E559 Vitamin D deficiency, unspecified: Secondary | ICD-10-CM

## 2020-03-23 DIAGNOSIS — I10 Essential (primary) hypertension: Secondary | ICD-10-CM | POA: Diagnosis not present

## 2020-03-23 DIAGNOSIS — E1142 Type 2 diabetes mellitus with diabetic polyneuropathy: Secondary | ICD-10-CM

## 2020-03-23 DIAGNOSIS — E785 Hyperlipidemia, unspecified: Secondary | ICD-10-CM

## 2020-03-23 DIAGNOSIS — Z79899 Other long term (current) drug therapy: Secondary | ICD-10-CM

## 2020-03-23 DIAGNOSIS — Z6836 Body mass index (BMI) 36.0-36.9, adult: Secondary | ICD-10-CM

## 2020-03-23 DIAGNOSIS — N183 Chronic kidney disease, stage 3 unspecified: Secondary | ICD-10-CM

## 2020-03-23 NOTE — Patient Instructions (Addendum)
Goals    . Fasting Blood Glucose <150     Need to check fasting glucose DAILY     . HEMOGLOBIN A1C < 7.0    . Weight (lb) < 225 lb (102.1 kg)      Please start taking whole tab of cholesterol medication (pravastatin)  Please have wife start fixing your plates again   Please get on the scale once a week - please keep a log - ideally would like to see you lose 0.5-1 lb/week.     High-Fiber Diet Fiber, also called dietary fiber, is a type of carbohydrate that is found in fruits, vegetables, whole grains, and beans. A high-fiber diet can have many health benefits. Your health care provider may recommend a high-fiber diet to help:  Prevent constipation. Fiber can make your bowel movements more regular.  Lower your cholesterol.  Relieve the following conditions: ? Swelling of veins in the anus (hemorrhoids). ? Swelling and irritation (inflammation) of specific areas of the digestive tract (uncomplicated diverticulosis). ? A problem of the large intestine (colon) that sometimes causes pain and diarrhea (irritable bowel syndrome, IBS).  Prevent overeating as part of a weight-loss plan.  Prevent heart disease, type 2 diabetes, and certain cancers. What is my plan? The recommended daily fiber intake in grams (g) includes:  38 g for men age 60 or younger.  30 g for men over age 85.  32 g for women age 1 or younger.  21 g for women over age 55. You can get the recommended daily intake of dietary fiber by:  Eating a variety of fruits, vegetables, grains, and beans.  Taking a fiber supplement, if it is not possible to get enough fiber through your diet. What do I need to know about a high-fiber diet?  It is better to get fiber through food sources rather than from fiber supplements. There is not a lot of research about how effective supplements are.  Always check the fiber content on the nutrition facts label of any prepackaged food. Look for foods that contain 5 g of fiber or  more per serving.  Talk with a diet and nutrition specialist (dietitian) if you have questions about specific foods that are recommended or not recommended for your medical condition, especially if those foods are not listed below.  Gradually increase how much fiber you consume. If you increase your intake of dietary fiber too quickly, you may have bloating, cramping, or gas.  Drink plenty of water. Water helps you to digest fiber. What are tips for following this plan?  Eat a wide variety of high-fiber foods.  Make sure that half of the grains that you eat each day are whole grains.  Eat breads and cereals that are made with whole-grain flour instead of refined flour or white flour.  Eat brown rice, bulgur wheat, or millet instead of white rice.  Start the day with a breakfast that is high in fiber, such as a cereal that contains 5 g of fiber or more per serving.  Use beans in place of meat in soups, salads, and pasta dishes.  Eat high-fiber snacks, such as berries, raw vegetables, nuts, and popcorn.  Choose whole fruits and vegetables instead of processed forms like juice or sauce. What foods can I eat?  Fruits Berries. Pears. Apples. Oranges. Avocado. Prunes and raisins. Dried figs. Vegetables Sweet potatoes. Spinach. Kale. Artichokes. Cabbage. Broccoli. Cauliflower. Green peas. Carrots. Squash. Grains Whole-grain breads. Multigrain cereal. Oats and oatmeal. Brown rice. Barley. Bulgur  wheat. Millet. Quinoa. Bran muffins. Popcorn. Rye wafer crackers. Meats and other proteins Navy, kidney, and pinto beans. Soybeans. Split peas. Lentils. Nuts and seeds. Dairy Fiber-fortified yogurt. Beverages Fiber-fortified soy milk. Fiber-fortified orange juice. Other foods Fiber bars. The items listed above may not be a complete list of recommended foods and beverages. Contact a dietitian for more options. What foods are not recommended? Fruits Fruit juice. Cooked, strained  fruit. Vegetables Fried potatoes. Canned vegetables. Well-cooked vegetables. Grains White bread. Pasta made with refined flour. White rice. Meats and other proteins Fatty cuts of meat. Fried chicken or fried fish. Dairy Milk. Yogurt. Cream cheese. Sour cream. Fats and oils Butters. Beverages Soft drinks. Other foods Cakes and pastries. The items listed above may not be a complete list of foods and beverages to avoid. Contact a dietitian for more information. Summary  Fiber is a type of carbohydrate. It is found in fruits, vegetables, whole grains, and beans.  There are many health benefits of eating a high-fiber diet, such as preventing constipation, lowering blood cholesterol, helping with weight loss, and reducing your risk of heart disease, diabetes, and certain cancers.  Gradually increase your intake of fiber. Increasing too fast can result in cramping, bloating, and gas. Drink plenty of water while you increase your fiber.  The best sources of fiber include whole fruits and vegetables, whole grains, nuts, seeds, and beans. This information is not intended to replace advice given to you by your health care provider. Make sure you discuss any questions you have with your health care provider. Document Revised: 07/13/2017 Document Reviewed: 07/13/2017 Elsevier Patient Education  2020 [employer].

## 2020-03-24 LAB — HEMOGLOBIN A1C
Hgb A1c MFr Bld: 9.5 % of total Hgb — ABNORMAL HIGH (ref <5.7)
Mean Plasma Glucose: 226 (calc)
eAG (mmol/L): 12.5 (calc)

## 2020-03-24 LAB — COMPLETE METABOLIC PANEL WITH GFR
AG Ratio: 1.9 (calc) (ref 1.0–2.5)
ALT: 15 U/L (ref 9–46)
AST: 14 U/L (ref 10–35)
Albumin: 4.1 g/dL (ref 3.6–5.1)
Alkaline phosphatase (APISO): 57 U/L (ref 35–144)
BUN/Creatinine Ratio: 18 (calc) (ref 6–22)
BUN: 35 mg/dL — ABNORMAL HIGH (ref 7–25)
CO2: 26 mmol/L (ref 20–32)
Calcium: 9.5 mg/dL (ref 8.6–10.3)
Chloride: 102 mmol/L (ref 98–110)
Creat: 1.9 mg/dL — ABNORMAL HIGH (ref 0.70–1.11)
GFR, Est African American: 37 mL/min/{1.73_m2} — ABNORMAL LOW (ref >=60)
GFR, Est Non African American: 32 mL/min/{1.73_m2} — ABNORMAL LOW (ref >=60)
Globulin: 2.2 g/dL (calc) (ref 1.9–3.7)
Glucose, Bld: 278 mg/dL — ABNORMAL HIGH (ref 65–99)
Potassium: 4.9 mmol/L (ref 3.5–5.3)
Sodium: 137 mmol/L (ref 135–146)
Total Bilirubin: 0.5 mg/dL (ref 0.2–1.2)
Total Protein: 6.3 g/dL (ref 6.1–8.1)

## 2020-03-24 LAB — CBC WITH DIFFERENTIAL/PLATELET
Absolute Monocytes: 587 cells/uL (ref 200–950)
Basophils Absolute: 53 cells/uL (ref 0–200)
Basophils Relative: 0.8 %
Eosinophils Absolute: 257 cells/uL (ref 15–500)
Eosinophils Relative: 3.9 %
HCT: 47.2 % (ref 38.5–50.0)
Hemoglobin: 14.3 g/dL (ref 13.2–17.1)
Lymphs Abs: 1511 cells/uL (ref 850–3900)
MCH: 27.9 pg (ref 27.0–33.0)
MCHC: 30.3 g/dL — ABNORMAL LOW (ref 32.0–36.0)
MCV: 92.2 fL (ref 80.0–100.0)
MPV: 11.2 fL (ref 7.5–12.5)
Monocytes Relative: 8.9 %
Neutro Abs: 4191 cells/uL (ref 1500–7800)
Neutrophils Relative %: 63.5 %
Platelets: 177 10*3/uL (ref 140–400)
RBC: 5.12 10*6/uL (ref 4.20–5.80)
RDW: 12.9 % (ref 11.0–15.0)
Total Lymphocyte: 22.9 %
WBC: 6.6 10*3/uL (ref 3.8–10.8)

## 2020-03-24 LAB — LIPID PANEL
Cholesterol: 159 mg/dL (ref <200)
HDL: 38 mg/dL — ABNORMAL LOW (ref >=40)
LDL Cholesterol (Calc): 83 mg/dL (calc)
Non-HDL Cholesterol (Calc): 121 mg/dL (calc) (ref <130)
Total CHOL/HDL Ratio: 4.2 (calc) (ref <5.0)
Triglycerides: 288 mg/dL — ABNORMAL HIGH (ref <150)

## 2020-03-24 LAB — TSH: TSH: 1.99 mIU/L (ref 0.40–4.50)

## 2020-03-24 LAB — MAGNESIUM: Magnesium: 2 mg/dL (ref 1.5–2.5)

## 2020-04-18 NOTE — Progress Notes (Signed)
Assessment and Plan:  Edwin Osborne was seen today for fall and cough.  Diagnoses and all orders for this visit:  Fall, initial encounter -     DG Chest 2 View; Future  Rib pain on right side -     DG Chest 2 View; Future -     traMADol (ULTRAM) 50 MG tablet; Take 1 tablet (50 mg total) by mouth every 6 (six) hours as needed.  Cough Mucinex DM to help thin mucus Can also use OTC cough suppresant  Essential hypertension No medications, elevated today, likely pain Monitor blood pressure at home; call if consistently over 140/90 Continue DASH diet.   Reminder to go to the ER if any CP, SOB, nausea, dizziness, severe HA, changes vision/speech, left arm numbness and tingling and jaw pain.    Further disposition pending results of labs. Discussed med's effects and SE's.   Over 30 minutes of face to face interview, exam, counseling, chart review, and critical decision making was performed.   Future Appointments  Date Time Provider [place]  04/19/2020 11:55 AM [facility] 1 [id number]  05/21/2020  4:00 PM Unk Pinto, MD [facility] None  06/26/2020  9:00 AM Garnet Sierras, NP [facility] None  09/20/2020  9:00 AM Liane Comber, NP [facility] None    ------------------------------------------------------------------------------------------------------------------   HPI 85 y.o.male presents for evaluation for right side rib pain.  He was walking and fell one step down and fell on the right side of his rib.  Reports that he has been taking tylenol and advil and they iced the area as well.  Reports that it is 10/10 pain with coughing and the pain is intense and brings him to his knees.  He has been using a pillow to splint the right side.  He has pain that is 7/10.  He denies any headaches, vision change, chest pains, shortness of breath, nausea vomiting, diarrhea, urinary symptoms or hematuria.  He is accompanied by his daughter today and did not drive.  He reports his  cough is productive and feels like there is stuff stuck in his throat but it is too painful to cough it up. Report he has had pneumonia once already this year.  He is not on any inhalers or home 02.  Last chest x-ray was 12/2013 with results below: FINDINGS:  The heart size and mediastinal contours are within normal limits.  Low lung volumes are noted with mild bibasilar scarring. No evidence  of acute infiltrate or edema. No evidence of pleural effusion. The  visualized skeletal structures are unremarkable.   IMPRESSION:  Stable low lung volumes and mild bibasilar scarring. No active  disease.    Past Medical History:  Diagnosis Date  . Arthritis   . BPH (benign prostatic hyperplasia)   . Cataract   . CKD (chronic kidney disease), stage III   . Depression    "usually a happy go lucky"  . Diabetes mellitus   . Diverticulosis   . GERD (gastroesophageal reflux disease)    sometimes will take omeprazole  . Hyperlipidemia   . Hypertension      No Known Allergies  Current Outpatient Medications on File Prior to Visit  Medication Sig  . aspirin EC 81 MG tablet Take 81 mg by mouth daily.  . Blood Glucose Monitoring Suppl (ONE TOUCH ULTRA 2) w/Device KIT check blood sugar 1 time daily-E11.21  . Cinnamon 500 MG TABS Take 1,000 mg by mouth.  . doxazosin (CARDURA) 8 MG tablet Take 1  tablet at Bedtime for BP & Prostate (Patient taking differently: Take 1/2 tablet at Bedtime for BP & Prostate)  . famotidine (PEPCID) 40 MG tablet Take 1 tablet Daily for Indigestion & Heartburn  . glimepiride (AMARYL) 4 MG tablet TAKE 1 TABLET BY MOUTH WITH BREAKFAST. IF FASTING GLUCOSE REMAINS ABOVE 150 IN THE MORNING TAKE ONE-HALF TABLET WITH DINNER. ONLY TAKE IF EATING.  Marland Kitchen glucose blood (ONETOUCH ULTRA) test strip Check blood sugasr 1 time daily-E11.21  . hydrochlorothiazide (HYDRODIURIL) 25 MG tablet Take 1 tablet Daily for BP & Fluid / Swelling  . Lancets (ONETOUCH ULTRASOFT) lancets Check blood sugar 1  time daily-E11.21  . lisinopril (ZESTRIL) 40 MG tablet Take 1 tablet Daily for BP & Diabetic Kidney Protection  . magnesium oxide (MAG-OX) 400 MG tablet Take 400 mg by mouth daily.  . Multiple Vitamins-Minerals (MULTIVITAMIN WITH MINERALS) tablet Take 1 tablet by mouth daily.    . pravastatin (PRAVACHOL) 40 MG tablet TAKE 1 TABLET BY MOUTH AT BEDTIME FOR CHOLESTEROL  . VITAMIN D PO Take 5,000 Units by mouth 2 (two) times daily.   No current facility-administered medications on file prior to visit.    ROS: all negative except above.   Physical Exam:  BP (!) 150/76   Pulse 72   Temp (!) 97 F (36.1 C)   Resp 18   Ht '5\' 9"'$  (1.753 m)   Wt (!) 250 lb 12.8 oz (113.8 kg)   SpO2 94%   BMI 37.04 kg/m   General Appearance: Well nourished, in no apparent distress. Eyes: PERRLA, EOMs, conjunctiva no swelling or erythema Sinuses: No Frontal/maxillary tenderness ENT/Mouth: Ext aud canals clear, TMs without erythema, bulging. No erythema, swelling, or exudate on post pharynx.  Tonsils not swollen or erythematous. Hearing normal.  Neck: Supple, thyroid normal.  Respiratory: Respiratory effort normal, BS equal bilaterally, rhonchi, wheezing or stridor. Rales bilaterally with decreased bs. Guarding to right and tenderness noted with palpation. Cardio: RRR with no MRGs. Brisk peripheral pulses without edema.  Abdomen: Soft, + BS.  Non tenderness noted to right lateral ribs 7-9, no guarding, rebound, hernias, masses. Lymphatics: Non tender without lymphadenopathy.  Musculoskeletal: Full ROM, 5/5 strength, normal gait.  Skin: Warm, dry without rashes, lesions, ecchymosis.  Neuro: Cranial nerves intact. Normal muscle tone, no cerebellar symptoms. Sensation intact.  Psych: Awake and oriented X 3, normal affect, Insight and Judgment appropriate.     Garnet Sierras, NP 11:53 AM [facility]

## 2020-04-19 ENCOUNTER — Ambulatory Visit
Admission: RE | Admit: 2020-04-19 | Discharge: 2020-04-19 | Disposition: A | Discharge status: Home / Self Care | Payer: Medicare Other | Source: Ambulatory Visit | Attending: Adult Health Nurse Practitioner | Admitting: Adult Health Nurse Practitioner

## 2020-04-19 ENCOUNTER — Ambulatory Visit (INDEPENDENT_AMBULATORY_CARE_PROVIDER_SITE_OTHER): Payer: Medicare Other | Admitting: Adult Health Nurse Practitioner

## 2020-04-19 ENCOUNTER — Encounter: Payer: Self-pay | Admitting: Adult Health Nurse Practitioner

## 2020-04-19 ENCOUNTER — Other Ambulatory Visit: Payer: Self-pay

## 2020-04-19 VITALS — BP 150/76 | HR 72 | Temp 97.0°F | Resp 18 | Ht 69.0 in | Wt 250.8 lb

## 2020-04-19 DIAGNOSIS — W19XXXA Unspecified fall, initial encounter: Secondary | ICD-10-CM

## 2020-04-19 DIAGNOSIS — I10 Essential (primary) hypertension: Secondary | ICD-10-CM | POA: Diagnosis not present

## 2020-04-19 DIAGNOSIS — R0781 Pleurodynia: Secondary | ICD-10-CM

## 2020-04-19 DIAGNOSIS — R059 Cough, unspecified: Secondary | ICD-10-CM

## 2020-04-19 DIAGNOSIS — S299XXA Unspecified injury of thorax, initial encounter: Secondary | ICD-10-CM | POA: Diagnosis not present

## 2020-04-19 DIAGNOSIS — R05 Cough: Secondary | ICD-10-CM

## 2020-04-19 MED ORDER — TRAMADOL HCL 50 MG PO TABS: 50.0000 mg | ORAL_TABLET | Freq: Four times a day (QID) | ORAL | 0 refills | Status: DC | PRN

## 2020-04-19 NOTE — Patient Instructions (Addendum)
   We are sending you for a chest X-ray  INFORMATION ABOUT YOUR XRAY [facility] Can walk into [address] building for an Insurance account manager. They will have the order and take you back. You do not any paper work, I should get the result back today or tomorrow. This order is good for a year.  Can call [phone] to schedule an appointment if you wish.     We are going to send in tramadol to use for your pain.  You can also take   When your pain is decreased be sure that you taking deep breaths and coughing.   Mucinex DM to help with mucus and cough management   To help with constipation use Metamucil every morning.

## 2020-04-20 ENCOUNTER — Encounter (HOSPITAL_COMMUNITY): Payer: Self-pay | Admitting: Internal Medicine

## 2020-04-20 ENCOUNTER — Emergency Department (HOSPITAL_COMMUNITY): Payer: Medicare Other

## 2020-04-20 ENCOUNTER — Observation Stay (HOSPITAL_COMMUNITY)
Admission: EM | Admit: 2020-04-20 | Discharge: 2020-04-20 | Disposition: A | Discharge status: Home / Self Care | Payer: Medicare Other | Attending: Internal Medicine | Admitting: Internal Medicine

## 2020-04-20 DIAGNOSIS — M6281 Muscle weakness (generalized): Secondary | ICD-10-CM | POA: Diagnosis not present

## 2020-04-20 DIAGNOSIS — Y999 Unspecified external cause status: Secondary | ICD-10-CM | POA: Insufficient documentation

## 2020-04-20 DIAGNOSIS — E1142 Type 2 diabetes mellitus with diabetic polyneuropathy: Secondary | ICD-10-CM | POA: Diagnosis present

## 2020-04-20 DIAGNOSIS — R05 Cough: Secondary | ICD-10-CM

## 2020-04-20 DIAGNOSIS — I10 Essential (primary) hypertension: Secondary | ICD-10-CM | POA: Diagnosis present

## 2020-04-20 DIAGNOSIS — R52 Pain, unspecified: Secondary | ICD-10-CM | POA: Diagnosis not present

## 2020-04-20 DIAGNOSIS — N183 Chronic kidney disease, stage 3 unspecified: Secondary | ICD-10-CM | POA: Diagnosis not present

## 2020-04-20 DIAGNOSIS — Z7982 Long term (current) use of aspirin: Secondary | ICD-10-CM | POA: Diagnosis not present

## 2020-04-20 DIAGNOSIS — J8410 Pulmonary fibrosis, unspecified: Secondary | ICD-10-CM | POA: Insufficient documentation

## 2020-04-20 DIAGNOSIS — W01198A Fall on same level from slipping, tripping and stumbling with subsequent striking against other object, initial encounter: Secondary | ICD-10-CM

## 2020-04-20 DIAGNOSIS — R0782 Intercostal pain: Secondary | ICD-10-CM | POA: Diagnosis not present

## 2020-04-20 DIAGNOSIS — N400 Enlarged prostate without lower urinary tract symptoms: Secondary | ICD-10-CM | POA: Insufficient documentation

## 2020-04-20 DIAGNOSIS — E1122 Type 2 diabetes mellitus with diabetic chronic kidney disease: Secondary | ICD-10-CM | POA: Diagnosis not present

## 2020-04-20 DIAGNOSIS — R2681 Unsteadiness on feet: Secondary | ICD-10-CM | POA: Diagnosis not present

## 2020-04-20 DIAGNOSIS — Z79899 Other long term (current) drug therapy: Secondary | ICD-10-CM | POA: Diagnosis not present

## 2020-04-20 DIAGNOSIS — N179 Acute kidney failure, unspecified: Secondary | ICD-10-CM

## 2020-04-20 DIAGNOSIS — E785 Hyperlipidemia, unspecified: Secondary | ICD-10-CM | POA: Insufficient documentation

## 2020-04-20 DIAGNOSIS — Z9181 History of falling: Secondary | ICD-10-CM | POA: Diagnosis not present

## 2020-04-20 DIAGNOSIS — Z6833 Body mass index (BMI) 33.0-33.9, adult: Secondary | ICD-10-CM | POA: Insufficient documentation

## 2020-04-20 DIAGNOSIS — J9811 Atelectasis: Secondary | ICD-10-CM | POA: Diagnosis not present

## 2020-04-20 DIAGNOSIS — I129 Hypertensive chronic kidney disease with stage 1 through stage 4 chronic kidney disease, or unspecified chronic kidney disease: Secondary | ICD-10-CM | POA: Diagnosis not present

## 2020-04-20 DIAGNOSIS — S4991XA Unspecified injury of right shoulder and upper arm, initial encounter: Secondary | ICD-10-CM | POA: Diagnosis not present

## 2020-04-20 DIAGNOSIS — R0781 Pleurodynia: Secondary | ICD-10-CM | POA: Diagnosis present

## 2020-04-20 DIAGNOSIS — Y9389 Activity, other specified: Secondary | ICD-10-CM | POA: Diagnosis not present

## 2020-04-20 DIAGNOSIS — Z794 Long term (current) use of insulin: Secondary | ICD-10-CM | POA: Insufficient documentation

## 2020-04-20 DIAGNOSIS — M25561 Pain in right knee: Secondary | ICD-10-CM | POA: Diagnosis present

## 2020-04-20 DIAGNOSIS — W19XXXA Unspecified fall, initial encounter: Secondary | ICD-10-CM | POA: Diagnosis not present

## 2020-04-20 DIAGNOSIS — W1839XA Other fall on same level, initial encounter: Secondary | ICD-10-CM | POA: Diagnosis not present

## 2020-04-20 DIAGNOSIS — E1165 Type 2 diabetes mellitus with hyperglycemia: Secondary | ICD-10-CM | POA: Diagnosis present

## 2020-04-20 DIAGNOSIS — I2510 Atherosclerotic heart disease of native coronary artery without angina pectoris: Secondary | ICD-10-CM | POA: Diagnosis not present

## 2020-04-20 DIAGNOSIS — S299XXA Unspecified injury of thorax, initial encounter: Secondary | ICD-10-CM | POA: Diagnosis not present

## 2020-04-20 DIAGNOSIS — S20211A Contusion of right front wall of thorax, initial encounter: Principal | ICD-10-CM

## 2020-04-20 DIAGNOSIS — I700 Atherosclerosis of aorta: Secondary | ICD-10-CM | POA: Diagnosis not present

## 2020-04-20 DIAGNOSIS — Z20822 Contact with and (suspected) exposure to covid-19: Secondary | ICD-10-CM | POA: Insufficient documentation

## 2020-04-20 DIAGNOSIS — R0902 Hypoxemia: Secondary | ICD-10-CM

## 2020-04-20 DIAGNOSIS — Y9289 Other specified places as the place of occurrence of the external cause: Secondary | ICD-10-CM | POA: Diagnosis not present

## 2020-04-20 DIAGNOSIS — E1169 Type 2 diabetes mellitus with other specified complication: Secondary | ICD-10-CM | POA: Diagnosis present

## 2020-04-20 DIAGNOSIS — E6609 Other obesity due to excess calories: Secondary | ICD-10-CM

## 2020-04-20 DIAGNOSIS — E669 Obesity, unspecified: Secondary | ICD-10-CM

## 2020-04-20 DIAGNOSIS — G8929 Other chronic pain: Secondary | ICD-10-CM | POA: Diagnosis present

## 2020-04-20 HISTORY — DX: Acute kidney failure, unspecified: N17.9

## 2020-04-20 LAB — BASIC METABOLIC PANEL
Anion gap: 13 (ref 5–15)
BUN: 39 mg/dL — ABNORMAL HIGH (ref 8–23)
CO2: 24 mmol/L (ref 22–32)
Calcium: 9.1 mg/dL (ref 8.9–10.3)
Chloride: 100 mmol/L (ref 98–111)
Creatinine, Ser: 2.25 mg/dL — ABNORMAL HIGH (ref 0.61–1.24)
GFR calc Af Amer: 30 mL/min — ABNORMAL LOW (ref >60)
GFR calc non Af Amer: 26 mL/min — ABNORMAL LOW (ref >60)
Glucose, Bld: 268 mg/dL — ABNORMAL HIGH (ref 70–99)
Potassium: 4.9 mmol/L (ref 3.5–5.1)
Sodium: 137 mmol/L (ref 135–145)

## 2020-04-20 LAB — URINALYSIS, ROUTINE W REFLEX MICROSCOPIC
Bacteria, UA: NONE SEEN
Bilirubin Urine: NEGATIVE
Glucose, UA: >=500 mg/dL — AB
Hgb urine dipstick: NEGATIVE
Ketones, ur: NEGATIVE mg/dL
Leukocytes,Ua: NEGATIVE
Nitrite: NEGATIVE
Protein, ur: NEGATIVE mg/dL
Specific Gravity, Urine: 1.013 (ref 1.005–1.030)
pH: 5 (ref 5.0–8.0)

## 2020-04-20 LAB — CBC WITH DIFFERENTIAL/PLATELET
Abs Immature Granulocytes: 0.05 10*3/uL (ref 0.00–0.07)
Basophils Absolute: 0.1 10*3/uL (ref 0.0–0.1)
Basophils Relative: 1 %
Eosinophils Absolute: 0.4 10*3/uL (ref 0.0–0.5)
Eosinophils Relative: 4 %
HCT: 41.5 % (ref 39.0–52.0)
Hemoglobin: 13.9 g/dL (ref 13.0–17.0)
Immature Granulocytes: 1 %
Lymphocytes Relative: 11 %
Lymphs Abs: 1.1 10*3/uL (ref 0.7–4.0)
MCH: 30.2 pg (ref 26.0–34.0)
MCHC: 33.5 g/dL (ref 30.0–36.0)
MCV: 90 fL (ref 80.0–100.0)
Monocytes Absolute: 1.2 10*3/uL — ABNORMAL HIGH (ref 0.1–1.0)
Monocytes Relative: 12 %
Neutro Abs: 7.2 10*3/uL (ref 1.7–7.7)
Neutrophils Relative %: 71 %
Platelets: 153 10*3/uL (ref 150–400)
RBC: 4.61 MIL/uL (ref 4.22–5.81)
RDW: 12.9 % (ref 11.5–15.5)
WBC: 10 10*3/uL (ref 4.0–10.5)
nRBC: 0 % (ref 0.0–0.2)

## 2020-04-20 LAB — SARS CORONAVIRUS 2 BY RT PCR (HOSPITAL ORDER, PERFORMED IN ~~LOC~~ HOSPITAL LAB): SARS Coronavirus 2: NEGATIVE

## 2020-04-20 MED ORDER — PRAVASTATIN SODIUM 20 MG PO TABS
20.0000 mg | ORAL_TABLET | Freq: Every day | ORAL | Status: DC
  Administered 2020-04-20: 20 mg via ORAL
  Filled 2020-04-20: qty 1

## 2020-04-20 MED ORDER — LACTATED RINGERS IV SOLN: INTRAVENOUS | Status: DC

## 2020-04-20 MED ORDER — FAMOTIDINE 20 MG PO TABS
10.0000 mg | ORAL_TABLET | Freq: Every day | ORAL | Status: DC
  Administered 2020-04-20: 10 mg via ORAL
  Filled 2020-04-20: qty 1

## 2020-04-20 MED ORDER — AZITHROMYCIN 250 MG PO TABS
500.0000 mg | ORAL_TABLET | Freq: Every day | ORAL | Status: DC
  Administered 2020-04-20: 500 mg via ORAL
  Filled 2020-04-20: qty 2

## 2020-04-20 MED ORDER — ASPIRIN EC 81 MG PO TBEC
81.0000 mg | DELAYED_RELEASE_TABLET | Freq: Every day | ORAL | Status: DC
  Administered 2020-04-20: 81 mg via ORAL
  Filled 2020-04-20 (×2): qty 1

## 2020-04-20 MED ORDER — VITAMIN D 25 MCG (1000 UNIT) PO TABS: 5000.0000 [IU] | ORAL_TABLET | Freq: Two times a day (BID) | ORAL | Status: DC

## 2020-04-20 MED ORDER — TRAMADOL HCL 50 MG PO TABS: 50.0000 mg | ORAL_TABLET | Freq: Two times a day (BID) | ORAL | Status: DC | PRN

## 2020-04-20 MED ORDER — ALBUTEROL SULFATE HFA 108 (90 BASE) MCG/ACT IN AERS: 2.0000 | INHALATION_SPRAY | Freq: Four times a day (QID) | RESPIRATORY_TRACT | Status: DC

## 2020-04-20 MED ORDER — ALBUTEROL SULFATE (2.5 MG/3ML) 0.083% IN NEBU
2.5000 mg | INHALATION_SOLUTION | Freq: Four times a day (QID) | RESPIRATORY_TRACT | Status: DC
  Administered 2020-04-20: 2.5 mg via RESPIRATORY_TRACT
  Filled 2020-04-20: qty 3

## 2020-04-20 MED ORDER — ADULT MULTIVITAMIN W/MINERALS CH
1.0000 | ORAL_TABLET | Freq: Every day | ORAL | Status: DC
  Administered 2020-04-20: 1 via ORAL
  Filled 2020-04-20: qty 1

## 2020-04-20 MED ORDER — LISINOPRIL 20 MG PO TABS
40.0000 mg | ORAL_TABLET | Freq: Every day | ORAL | Status: DC
  Administered 2020-04-20: 40 mg via ORAL
  Filled 2020-04-20: qty 2

## 2020-04-20 MED ORDER — DOXAZOSIN MESYLATE 4 MG PO TABS
2.0000 mg | ORAL_TABLET | Freq: Every day | ORAL | Status: DC
  Filled 2020-04-20: qty 1

## 2020-04-20 MED ORDER — IPRATROPIUM BROMIDE 0.02 % IN SOLN
0.5000 mg | Freq: Four times a day (QID) | RESPIRATORY_TRACT | Status: DC
  Administered 2020-04-20: 0.5 mg via RESPIRATORY_TRACT
  Filled 2020-04-20: qty 2.5

## 2020-04-20 MED ORDER — FENTANYL CITRATE (PF) 100 MCG/2ML IJ SOLN
25.0000 ug | Freq: Once | INTRAMUSCULAR | Status: AC
  Administered 2020-04-20: 25 ug via INTRAVENOUS
  Filled 2020-04-20: qty 2

## 2020-04-20 MED ORDER — HYDROCHLOROTHIAZIDE 12.5 MG PO CAPS: 25.0000 mg | ORAL_CAPSULE | Freq: Every day | ORAL | Status: DC

## 2020-04-20 MED ORDER — GLIMEPIRIDE 4 MG PO TABS
4.0000 mg | ORAL_TABLET | Freq: Every day | ORAL | Status: DC
  Filled 2020-04-20: qty 1

## 2020-04-20 MED ORDER — LIDOCAINE 5 % EX PTCH
1.0000 | MEDICATED_PATCH | CUTANEOUS | Status: DC
  Administered 2020-04-20: 1 via TRANSDERMAL
  Filled 2020-04-20: qty 1

## 2020-04-20 MED ORDER — SODIUM CHLORIDE 0.9 % IV BOLUS
500.0000 mL | Freq: Once | INTRAVENOUS | Status: AC
  Administered 2020-04-20: 500 mL via INTRAVENOUS

## 2020-04-20 MED ORDER — ALBUTEROL SULFATE (2.5 MG/3ML) 0.083% IN NEBU
2.5000 mg | INHALATION_SOLUTION | Freq: Two times a day (BID) | RESPIRATORY_TRACT | Status: DC
  Filled 2020-04-20: qty 3

## 2020-04-20 MED ORDER — MAGNESIUM OXIDE 400 (241.3 MG) MG PO TABS: 400.0000 mg | ORAL_TABLET | Freq: Every day | ORAL | Status: DC

## 2020-04-20 MED ORDER — ONDANSETRON HCL 4 MG/2ML IJ SOLN: 4.0000 mg | Freq: Four times a day (QID) | INTRAMUSCULAR | Status: DC | PRN

## 2020-04-20 MED ORDER — SENNOSIDES-DOCUSATE SODIUM 8.6-50 MG PO TABS: 1.0000 | ORAL_TABLET | Freq: Every evening | ORAL | Status: DC | PRN

## 2020-04-20 MED ORDER — ACETAMINOPHEN 325 MG PO TABS: 650.0000 mg | ORAL_TABLET | Freq: Four times a day (QID) | ORAL | Status: DC | PRN

## 2020-04-20 MED ORDER — HYDROCHLOROTHIAZIDE 12.5 MG PO CAPS
25.0000 mg | ORAL_CAPSULE | Freq: Every day | ORAL | Status: DC
  Administered 2020-04-20: 25 mg via ORAL
  Filled 2020-04-20: qty 2

## 2020-04-20 MED ORDER — HYDROCODONE-ACETAMINOPHEN 5-325 MG PO TABS
1.0000 | ORAL_TABLET | Freq: Once | ORAL | Status: AC
  Administered 2020-04-20: 1 via ORAL
  Filled 2020-04-20: qty 1

## 2020-04-20 MED ORDER — ONDANSETRON HCL 4 MG PO TABS: 4.0000 mg | ORAL_TABLET | Freq: Four times a day (QID) | ORAL | Status: DC | PRN

## 2020-04-20 MED ORDER — BENZONATATE 100 MG PO CAPS
100.0000 mg | ORAL_CAPSULE | Freq: Once | ORAL | Status: AC
  Administered 2020-04-20: 100 mg via ORAL
  Filled 2020-04-20: qty 1

## 2020-04-20 MED ORDER — GUAIFENESIN-DM 100-10 MG/5ML PO SYRP
5.0000 mL | ORAL_SOLUTION | ORAL | Status: DC | PRN
  Administered 2020-04-20: 5 mL via ORAL
  Filled 2020-04-20: qty 10

## 2020-04-20 MED ORDER — ENOXAPARIN SODIUM 40 MG/0.4ML ~~LOC~~ SOLN
40.0000 mg | SUBCUTANEOUS | Status: DC
  Administered 2020-04-20: 40 mg via SUBCUTANEOUS
  Filled 2020-04-20: qty 0.4

## 2020-04-20 MED ORDER — ACETAMINOPHEN 650 MG RE SUPP: 650.0000 mg | Freq: Four times a day (QID) | RECTAL | Status: DC | PRN

## 2020-04-20 MED ORDER — OXYCODONE HCL 5 MG PO TABS
5.0000 mg | ORAL_TABLET | ORAL | Status: DC | PRN
  Administered 2020-04-20: 5 mg via ORAL
  Filled 2020-04-20: qty 1

## 2020-04-20 MED ORDER — ALBUTEROL SULFATE (2.5 MG/3ML) 0.083% IN NEBU: 2.5000 mg | INHALATION_SOLUTION | RESPIRATORY_TRACT | Status: DC | PRN

## 2020-04-20 NOTE — ED Notes (Signed)
PT at bedisde. Pt visitor advised of visiting policy and verbalized understanding.

## 2020-04-20 NOTE — ED Notes (Signed)
[facility] aware pt wishes to leave AMA. Advises prescriptions will not be sent.

## 2020-04-20 NOTE — ED Notes (Addendum)
Pt verbalizes " I dont want to stay here all night I want to go home please tell the doctor" . [facility] paged

## 2020-04-20 NOTE — Discharge Instructions (Signed)
Your testing today shows no serious traumatic injury.  There is no rib fracture or collapsed lung.  No internal damage to your chest, abdomen or pelvis.  Take the pain medication as prescribed.  Follow-up with your doctor.  Your kidney function slightly worse today.  Return to the ED if you develop new or worsening symptoms.

## 2020-04-20 NOTE — Evaluation (Addendum)
` Physical Therapy Evaluation Patient Details Name: Edwin Osborne MRN: [id number] DOB: 1935/03/28 Today's Date: 04/20/2020   History of Present Illness  Edwin Osborne is a 85 y.o. male with medical history significant for CKD stage III, diabetes, hypertension, hyperlipidemia, chronic knee pain to the ED for evaluation of pain in the right-sided rib and flank after patient had fall 04/17/20 against a wooden fence post.negative for fractures of ribs.  Clinical Impression  The patient reports right side chest pain, especially when coughing and rolling. Patient required 2  Mod/max assist to roll and sit on edge of stretcher. Patient reports that sitting  Feels quite well and tolerated mobility better than anticipated by patient. Patient on 2 L [place], ? Dropping  into high 80's on RA for a short time. Patient currently requiring extensive assitance and has not attempted standing.  Pt admitted with above diagnosis. Pt currently with functional limitations due to the deficits listed below (see PT Problem List). Pt will benefit from skilled PT to increase their independence and safety with mobility to allow discharge to the venue listed below.   Assisting to with m    Follow Up Recommendations SNF;Home health PT;Supervision/Assistance - 24 hour- depending on pain and   Progress.    Equipment Recommendations  Rolling walker with 5" wheels    Recommendations for Other Services       Precautions / Restrictions Precautions Precautions: Fall Precaution Comments: urinary urgency      Mobility  Bed Mobility Overal bed mobility: Needs Assistance Bed Mobility: Rolling;Sidelying to Sit;Sit to Supine Rolling: Mod assist;+2 for physical assistance;+2 for safety/equipment Sidelying to sit: +2 for safety/equipment;+2 for physical assistance;Mod assist   Sit to supine: Max assist;+2 for physical assistance   General bed mobility comments: patient flexes knees in prep to roll, able to rech for rail,  mod assistance to complete roll. Assistance with legs and trunk to sit upright. assistance for legs onto bed, HOB in upright poition so trunk support not needed.  Transfers                 General transfer comment: NT, on stretcher  Ambulation/Gait                Stairs            Wheelchair Mobility    Modified Rankin (Stroke Patients Only)       Balance Overall balance assessment: Needs assistance;History of Falls Sitting-balance support: Bilateral upper extremity supported;Feet supported Sitting balance-Leahy Scale: Fair Sitting balance - Comments: supports self in midline       Standing balance comment: did not attempt                             Pertinent Vitals/Pain Pain Assessment: Faces Faces Pain Scale: Hurts worst Pain Location: right side chest when coughing and rolling Pain Descriptors / Indicators: Grimacing;Guarding;Moaning;Jabbing Pain Intervention(s): Limited activity within patient's tolerance;Monitored during session;Premedicated before session;Repositioned    Home Living Family/patient expects to be discharged to:: Private residence   Available Help at Discharge: Family Type of Home: House Home Access: Stairs to enter   Technical brewer of Steps: 3-4 Home Layout: One level Home Equipment: Shower seat - built in;Cane - single point      Prior Function Level of Independence: Independent         Comments: drives, weed eats     Hand Dominance  Extremity/Trunk Assessment    defer to OT for UE    Lower Extremity Assessment Lower Extremity Assessment: Generalized weakness    Cervical / Trunk Assessment Cervical / Trunk Assessment: Kyphotic  Communication   Communication: No difficulties  Cognition Arousal/Alertness: Awake/alert Behavior During Therapy: WFL for tasks assessed/performed Overall Cognitive Status: Within Functional Limits for tasks assessed                                         General Comments      Exercises     Assessment/Plan    PT Assessment Patient needs continued PT services  PT Problem List Decreased strength;Decreased mobility;Decreased safety awareness;Decreased range of motion;Decreased knowledge of precautions;Decreased activity tolerance;Decreased balance;Decreased knowledge of use of DME       PT Treatment Interventions DME instruction;Therapeutic activities;Gait training;Therapeutic exercise;Patient/family education;Stair training;Functional mobility training    PT Goals (Current goals can be found in the Care Plan section)  Acute Rehab PT Goals Patient Stated Goal: to go home, PT Goal Formulation: With patient/family Time For Goal Achievement: 05/04/20 Potential to Achieve Goals: Good    Frequency Min 3X/week   Barriers to discharge        Co-evaluation               AM-PAC PT "6 Clicks" Mobility  Outcome Measure Help needed turning from your back to your side while in a flat bed without using bedrails?: A Lot Help needed moving from lying on your back to sitting on the side of a flat bed without using bedrails?: A Lot Help needed moving to and from a bed to a chair (including a wheelchair)?: Total Help needed standing up from a chair using your arms (e.g., wheelchair or bedside chair)?: Total Help needed to walk in hospital room?: Total Help needed climbing 3-5 steps with a railing? : Total 6 Click Score: 8    End of Session Equipment Utilized During Treatment: Oxygen Activity Tolerance: Patient tolerated treatment well Patient left: in bed;with call bell/phone within reach;with family/visitor present Nurse Communication: Mobility status PT Visit Diagnosis: Unsteadiness on feet (R26.81);History of falling (Z91.81);Muscle weakness (generalized) (M62.81);Pain Pain - Right/Left: Right Pain - part of body: Shoulder    Time: 2641-5830 PT Time Calculation (min) (ACUTE ONLY): 40 min   Charges:   PT  Evaluation $PT Eval Moderate Complexity: [place] Pager [phone] Office [phone]   Claretha Cooper 04/20/2020, 4:26 PM

## 2020-04-20 NOTE — Progress Notes (Signed)
OT Cancellation Note  Patient Details Name: [name] MRN: [id number] DOB: 19-Jun-1935   Cancelled Treatment:    Reason Eval/Treat Not Completed: Patient declined, requesting cough medicine before getting OOB. Patient reports with coughing his R flank pain is a 10/10 "I can't do it right now." Notified RN requesting cough medicine, will re-attempt as schedule permits.  Delbert Phenix OT Pager: [place] 04/20/2020, 12:32 PM

## 2020-04-20 NOTE — H&P (Signed)
History and Physical    Edwin Osborne HQR:[id number] DOB: 1935-05-22 DOA: 04/20/2020  PCP: Unk Pinto, MD   Patient coming from: HOME  Chief Complaint  Patient presents with  . Flank Pain     HPI: Edwin Osborne is a 85 y.o. male with medical history significant for CKD stage III, diabetes, hypertension, hyperlipidemia, chronic knee pain to the ED for evaluation of pain in the right-sided rib and flank after patient had fall 2 nights ago against a wooden fence post.  ED Course: Blood pressure stable noted to be borderline hypoxic placed on 2 L nasal cannula, apparently did percent on room air.  Patient was complaining of a lot of pain issues was given fentanyl and oral opiates in the ED.  Routine labs showed elevated creatinine compared to baseline, CBC stable, no UTI on UA, chest x-ray no pneumonia x-ray of humerus right as well as CT chest abdomen pelvis without contrast was obtained with no acute fracture. patient was having a lot of pain and had difficulty ambulating so admission was requested.  He c/o congestion and cough with greenish phlegm. His main concern is pain on right lateral  side of chest and unable to cough and unabl to take deep breath and otherwise denies any shortness of breath. On my exam his spo2 is holding at 90%-91% on RA after being off oxygen for 5-7 mins or so. He feels much better already aftter pain meds and was able to sleep some this am, has been up all night. His wife is at bedside.  Review of Systems: All systems were reviewed and were negative except as mentioned in HPI above. Negative for fever Negative for focal weakness Negative for shortness of breath  Past Medical History:  Diagnosis Date  . Arthritis   . BPH (benign prostatic hyperplasia)   . Cataract   . CKD (chronic kidney disease), stage III   . Depression    "usually a happy go lucky"  . Diabetes mellitus   . Diverticulosis   . GERD (gastroesophageal reflux disease)     sometimes will take omeprazole  . Hyperlipidemia   . Hypertension     Past Surgical History:  Procedure Laterality Date  . CATARACT EXTRACTION, BILATERAL Bilateral 2020   Dr. Einar Gip  . CHOLECYSTECTOMY N/A 11/29/2015   Procedure: LAPAROSCOPIC CHOLECYSTECTOMY WITH INTRAOPERATIVE CHOLANGIOGRAM;  Surgeon: Erroll Luna, MD;  Location: [facility] OR;  Service: General;  Laterality: N/A;  . COLONOSCOPY  2005, 08/21/11   2005 tubulovillous adenoma and adenomas 2012 - 3 adenomas, largest 10 mm, diverticulosis  . FRACTURE SURGERY     ? right foot as a child  . KNEE ARTHROSCOPY    . TONSILLECTOMY AND ADENOIDECTOMY    . VEIN LIGATION AND STRIPPING     left     reports that he has been smoking cigars. He has never used smokeless tobacco. He reports current alcohol use. He reports that he does not use drugs.  No Known Allergies  Family History  Problem Relation Age of Onset  . Prostate cancer Brother   . Diabetes Brother   . Colon cancer Neg Hx      Prior to Admission medications   Medication Sig Start Date End Date Taking? Authorizing Provider  aspirin EC 81 MG tablet Take 81 mg by mouth daily.   Yes [provider]  Cinnamon 500 MG TABS Take 1,000 mg by mouth.   Yes [provider]  doxazosin (CARDURA) 8 MG tablet Take 1 tablet  at Bedtime for BP & Prostate Patient taking differently: Take 4 mg by mouth See admin instructions. Take 1/2 tablet at Bedtime for BP & Prostate 01/05/20  Yes Unk Pinto, MD  famotidine (PEPCID) 40 MG tablet Take 1 tablet Daily for Indigestion & Heartburn 08/08/19  Yes Unk Pinto, MD  glimepiride (AMARYL) 4 MG tablet TAKE 1 TABLET BY MOUTH WITH BREAKFAST. IF FASTING GLUCOSE REMAINS ABOVE 150 IN THE MORNING TAKE ONE-HALF TABLET WITH DINNER. ONLY TAKE IF EATING. Patient taking differently: Take 4 mg by mouth See admin instructions. TAKE 1 TABLET BY MOUTH WITH BREAKFAST. IF FASTING GLUCOSE REMAINS ABOVE 150 IN THE MORNING TAKE ONE-HALF TABLET  WITH DINNER. ONLY TAKE IF EATING. 03/09/20  Yes Liane Comber, NP  hydrochlorothiazide (HYDRODIURIL) 25 MG tablet Take 1 tablet Daily for BP & Fluid / Swelling Patient taking differently: Take 25 mg by mouth daily. for BP & Fluid / Swelling 11/09/19  Yes Unk Pinto, MD  lisinopril (ZESTRIL) 40 MG tablet Take 1 tablet Daily for BP & Diabetic Kidney Protection Patient taking differently: Take 40 mg by mouth daily. for BP & Diabetic Kidney Protection 11/30/19  Yes Unk Pinto, MD  magnesium oxide (MAG-OX) 400 MG tablet Take 400 mg by mouth daily.   Yes [provider]  Multiple Vitamins-Minerals (MULTIVITAMIN WITH MINERALS) tablet Take 1 tablet by mouth daily.     Yes [provider]  pravastatin (PRAVACHOL) 40 MG tablet TAKE 1 TABLET BY MOUTH AT BEDTIME FOR CHOLESTEROL Patient taking differently: Take 20 mg by mouth daily. BEDTIME FOR CHOLESTEROL 08/24/19  Yes Unk Pinto, MD  traMADol (ULTRAM) 50 MG tablet Take 1 tablet (50 mg total) by mouth every 6 (six) hours as needed. Patient taking differently: Take 50 mg by mouth every 6 (six) hours as needed for moderate pain.  04/19/20 04/19/21 Yes McClanahan, Kyra, NP  VITAMIN D PO Take 5,000 Units by mouth 2 (two) times daily.   Yes [provider]  Blood Glucose Monitoring Suppl (ONE TOUCH ULTRA 2) w/Device KIT check blood sugar 1 time daily-E11.21 11/25/19   Unk Pinto, MD  glucose blood [facility] ULTRA) test strip Check blood sugasr 1 time daily-E11.21 11/25/19   Unk Pinto, MD  Lancets [facility] ULTRASOFT) lancets Check blood sugar 1 time daily-E11.21 11/25/19   Unk Pinto, MD    Physical Exam: Vitals:   04/20/20 0300 04/20/20 0400 04/20/20 0500 04/20/20 0700  BP: (!) 125/62 (!) 125/64 (!) 158/83 (!) 158/81  Pulse: 88 81 90 96  Resp: _0 Temp:      TempSrc:      SpO2: 96% 96% 95% 92%  Weight:      Height:        General exam: AAOx3, chronically ill looking , NAD, weak  appearing. HEENT:Oral mucosa moist, Ear/Nose WNL grossly, dentition normal. Respiratory system: bilaterally basal crackles and clear breath sounds,no wheezing or crackles,no use of accessory muscle. Rt chest wall is tender to touch. Cardiovascular system: S1 & S2 +, No JVD,. Gastrointestinal system: Abdomen soft, NT,ND, BS+ Nervous System:Alert, awake, moving extremities and grossly nonfocal Extremities: No edema, distal peripheral pulses palpable.  Skin: No rashes,no icterus. MSK: Normal muscle bulk,tone, power   Labs on Admission: I have personally reviewed following labs and imaging studies  CBC: Recent Labs  Lab 04/20/20 0255  WBC 10.0  NEUTROABS 7.2  HGB 13.9  HCT 41.5  MCV 90.0  PLT 151   Basic Metabolic Panel: Recent Labs  Lab 04/20/20 0255  NA  137  K 4.9  CL 100  CO2 24  GLUCOSE 268*  BUN 39*  CREATININE 2.25*  CALCIUM 9.1   GFR: Estimated Creatinine Clearance: 30.9 mL/min (A) (by C-G formula based on SCr of 2.25 mg/dL (H)). Liver Function Tests: No results for input(s): AST, ALT, ALKPHOS, BILITOT, PROT, ALBUMIN in the last 168 hours. No results for input(s): LIPASE, AMYLASE in the last 168 hours. No results for input(s): AMMONIA in the last 168 hours. Coagulation Profile: No results for input(s): INR, PROTIME in the last 168 hours. Cardiac Enzymes: No results for input(s): CKTOTAL, CKMB, CKMBINDEX, TROPONINI in the last 168 hours. BNP (last 3 results) No results for input(s): PROBNP in the last 8760 hours. HbA1C: No results for input(s): HGBA1C in the last 72 hours. CBG: No results for input(s): GLUCAP in the last 168 hours. Lipid Profile: No results for input(s): CHOL, HDL, LDLCALC, TRIG, CHOLHDL, LDLDIRECT in the last 72 hours. Thyroid Function Tests: No results for input(s): TSH, T4TOTAL, FREET4, T3FREE, THYROIDAB in the last 72 hours. Anemia Panel: No results for input(s): VITAMINB12, FOLATE, FERRITIN, TIBC, IRON, RETICCTPCT in the last 72  hours. Urine analysis:    Component Value Date/Time   COLORURINE YELLOW 04/20/2020 0436   APPEARANCEUR CLEAR 04/20/2020 0436   LABSPEC 1.013 04/20/2020 0436   PHURINE 5.0 04/20/2020 0436   GLUCOSEU >=500 (A) 04/20/2020 0436   HGBUR NEGATIVE 04/20/2020 0436   BILIRUBINUR NEGATIVE 04/20/2020 0436   KETONESUR NEGATIVE 04/20/2020 0436   PROTEINUR NEGATIVE 04/20/2020 0436   NITRITE NEGATIVE 04/20/2020 0436   LEUKOCYTESUR NEGATIVE 04/20/2020 0436    Radiological Exams on Admission: CT ABDOMEN PELVIS WO CONTRAST  Result Date: 04/20/2020 CLINICAL DATA:  Pain following recent fall EXAM: CT CHEST, ABDOMEN AND PELVIS WITHOUT CONTRAST TECHNIQUE: Multidetector CT imaging of the chest, abdomen and pelvis was performed following the standard protocol without IV contrast. COMPARISON:  CT abdomen and pelvis October 21, 2016 FINDINGS: CT CHEST FINDINGS Cardiovascular: There is no evident fluid or apparent hemorrhage in the periaortic/mediastinal region. There is no thoracic aortic aneurysm. There are scattered foci of calcification in visualized great vessels. There is aortic atherosclerosis. There are foci of coronary artery calcification. There is no appreciable pericardial effusion or pericardial thickening. Mediastinum/Nodes: Thyroid appears normal. There is no appreciable thoracic adenopathy. No esophageal lesions are evident. Lungs/Pleura: There is no appreciable pneumothorax. There are areas of patchy atelectatic change, primarily in the lung bases. There are occasional calcified granulomas. There is no appreciable edema or airspace opacity to suggest pneumonia. There are multiple foci of pleural calcification. No appreciable pleural effusions. Musculoskeletal: There is degenerative change in the thoracic spine with a degree of diffuse idiopathic skeletal hyperostosis. There is arthropathy with vacuum phenomenon in each sternoclavicular joint. There is no demonstrable fracture or dislocation. No blastic or  lytic bone lesions. No evident chest wall lesions. CT ABDOMEN PELVIS FINDINGS Hepatobiliary: No perihepatic fluid noted. No focal liver lesions are evident on this noncontrast enhanced study. No overt liver laceration or rupture noted. Gallbladder is absent. There is no biliary duct dilatation. Pancreas: No pancreatic mass or inflammatory focus. No peripancreatic fluid. Spleen: No perisplenic fluid. No abnormal attenuation in the spleen to suggest potential laceration or rupture on noncontrast enhanced study. No splenic lesions evident. Adrenals/Urinary Tract: Adrenals bilaterally appear normal. There is a cyst in the lower pole of the left kidney measuring 2.7 x 2.4 cm. There is no appreciable hydronephrosis on either side. Perinephric thickening bilaterally is stable compared to the 2018 study.  There is not felt to be perinephric fluid on either side currently. There is no evident renal or ureteral calculus on either side. Urinary bladder is midline with wall thickness within normal limits. Stomach/Bowel: There is no appreciable bowel wall or mesenteric thickening. There is no evident bowel obstruction. Terminal ileum appears normal. No evident free air or portal venous air. Vascular/Lymphatic: No abdominal aortic aneurysm. There is aortic and common iliac artery atherosclerosis. No periaortic fluid. No adenopathy appreciable in the abdomen or pelvis. Reproductive: Prostate is prominent, similar in appearance to prior study. Prostate abuts and impresses upon the inferior aspect of the urinary bladder. Seminal vesicles appear normal bilaterally. Other: Appendix appears normal. No abscess or ascites in the abdomen or pelvis. No loculated fluid collections evident in the abdomen or pelvis. Musculoskeletal: No appreciable fracture or dislocation. There is degenerative change in the lumbar spine. No blastic or lytic bone lesions. There is no intramuscular or abdominal wall lesions. IMPRESSION: Chest CT: 1. No  pneumothorax. Areas of atelectatic change bilaterally. Areas of pleural calcification may represent residua from asbestos exposure. Occasional lung granulomas noted. No edema or consolidation. 2. Aortic atherosclerosis. Foci of great vessel and coronary artery calcification noted. No mediastinal hemorrhage or fluid noted. Note that evaluation of the aorta is limited without intravenous contrast. 3.  No evident adenopathy. CT abdomen and pelvis: 1. No acute traumatic appearing lesion evident on this noncontrast enhanced study. 2. No bowel wall thickening or bowel obstruction. No abscess in the abdomen or pelvis. Appendix appears normal. 3. Prominence of the prostate is stable. Prostate abuts the inferior either. 4.  Gallbladder absent. 5.  Aortic Atherosclerosis (ICD10-I70.0). Electronically Signed   By: Lowella Grip III M.D.   On: 04/20/2020 06:40   DG Chest 2 View  Result Date: 04/19/2020 CLINICAL DATA:  Fall EXAM: CHEST - 2 VIEW COMPARISON:  01/16/2014 FINDINGS: Linear scarring or atelectasis at the left base. No acute consolidation or effusion. Normal heart size. No pneumothorax. IMPRESSION: No active cardiopulmonary disease. Electronically Signed   By: Donavan Foil M.D.   On: 04/19/2020 19:32   CT Chest Wo Contrast  Result Date: 04/20/2020 CLINICAL DATA:  Pain following recent fall EXAM: CT CHEST, ABDOMEN AND PELVIS WITHOUT CONTRAST TECHNIQUE: Multidetector CT imaging of the chest, abdomen and pelvis was performed following the standard protocol without IV contrast. COMPARISON:  CT abdomen and pelvis October 21, 2016 FINDINGS: CT CHEST FINDINGS Cardiovascular: There is no evident fluid or apparent hemorrhage in the periaortic/mediastinal region. There is no thoracic aortic aneurysm. There are scattered foci of calcification in visualized great vessels. There is aortic atherosclerosis. There are foci of coronary artery calcification. There is no appreciable pericardial effusion or pericardial  thickening. Mediastinum/Nodes: Thyroid appears normal. There is no appreciable thoracic adenopathy. No esophageal lesions are evident. Lungs/Pleura: There is no appreciable pneumothorax. There are areas of patchy atelectatic change, primarily in the lung bases. There are occasional calcified granulomas. There is no appreciable edema or airspace opacity to suggest pneumonia. There are multiple foci of pleural calcification. No appreciable pleural effusions. Musculoskeletal: There is degenerative change in the thoracic spine with a degree of diffuse idiopathic skeletal hyperostosis. There is arthropathy with vacuum phenomenon in each sternoclavicular joint. There is no demonstrable fracture or dislocation. No blastic or lytic bone lesions. No evident chest wall lesions. CT ABDOMEN PELVIS FINDINGS Hepatobiliary: No perihepatic fluid noted. No focal liver lesions are evident on this noncontrast enhanced study. No overt liver laceration or rupture noted. Gallbladder is absent. There  is no biliary duct dilatation. Pancreas: No pancreatic mass or inflammatory focus. No peripancreatic fluid. Spleen: No perisplenic fluid. No abnormal attenuation in the spleen to suggest potential laceration or rupture on noncontrast enhanced study. No splenic lesions evident. Adrenals/Urinary Tract: Adrenals bilaterally appear normal. There is a cyst in the lower pole of the left kidney measuring 2.7 x 2.4 cm. There is no appreciable hydronephrosis on either side. Perinephric thickening bilaterally is stable compared to the 2018 study. There is not felt to be perinephric fluid on either side currently. There is no evident renal or ureteral calculus on either side. Urinary bladder is midline with wall thickness within normal limits. Stomach/Bowel: There is no appreciable bowel wall or mesenteric thickening. There is no evident bowel obstruction. Terminal ileum appears normal. No evident free air or portal venous air. Vascular/Lymphatic: No  abdominal aortic aneurysm. There is aortic and common iliac artery atherosclerosis. No periaortic fluid. No adenopathy appreciable in the abdomen or pelvis. Reproductive: Prostate is prominent, similar in appearance to prior study. Prostate abuts and impresses upon the inferior aspect of the urinary bladder. Seminal vesicles appear normal bilaterally. Other: Appendix appears normal. No abscess or ascites in the abdomen or pelvis. No loculated fluid collections evident in the abdomen or pelvis. Musculoskeletal: No appreciable fracture or dislocation. There is degenerative change in the lumbar spine. No blastic or lytic bone lesions. There is no intramuscular or abdominal wall lesions. IMPRESSION: Chest CT: 1. No pneumothorax. Areas of atelectatic change bilaterally. Areas of pleural calcification may represent residua from asbestos exposure. Occasional lung granulomas noted. No edema or consolidation. 2. Aortic atherosclerosis. Foci of great vessel and coronary artery calcification noted. No mediastinal hemorrhage or fluid noted. Note that evaluation of the aorta is limited without intravenous contrast. 3.  No evident adenopathy. CT abdomen and pelvis: 1. No acute traumatic appearing lesion evident on this noncontrast enhanced study. 2. No bowel wall thickening or bowel obstruction. No abscess in the abdomen or pelvis. Appendix appears normal. 3. Prominence of the prostate is stable. Prostate abuts the inferior either. 4.  Gallbladder absent. 5.  Aortic Atherosclerosis (ICD10-I70.0). Electronically Signed   By: Lowella Grip III M.D.   On: 04/20/2020 06:40   DG Humerus Right  Result Date: 04/20/2020 CLINICAL DATA:  Recent fall with arm pain, initial encounter EXAM: RIGHT HUMERUS - 2+ VIEW COMPARISON:  None. FINDINGS: There is no evidence of fracture or other focal bone lesions. Soft tissues are unremarkable. IMPRESSION: No acute fracture noted. Electronically Signed   By: [name] M.D.   On: 04/20/2020  03:40    Assessment/Plan  Right-sided rib and flank pain secondary to fall, suspect rib contusion.  CT chest abdomen pelvis no acute finding. We will cont with pain management oxycodone, tylenol, llidocaine patch, aggressive pulmonary toileting with IS, nebs, supplemental [place] as needed. He smokes cigar- doing it intermittently for many years. but not cigarette may need evaluation/PFT as outpatient.  Mild pulmonary insufficiency needing low flow nasal cannula. Felt to be 2/2 pain as patient unable to take deep breath.  Also complains of cough and congestion question bronchitis we will add azithromycin. No pneumothorax on CT scan but has areas of atelectatic changes bilaterally, pleural calcification may represent residual from asbestosis exposure, occasional lung granulomata were noted no edema or consolidation.  Continue pulmonary toileting pain control as above  AKI on CKD stage III with baseline creatinine 1.81.9 July/2/21 currently 2.2.  Will hydrate gently with IV fluids and repeat labs.  Essential hypertension on  HCTZ 25, lisinopril 40 mg, Cardura 4 mg bedtime at home-resume home meds.  BPH on Cardura at bedtime.  Type 2 diabetes mellitus with stage 3 chronic kidney disease, without long-term current use of insulin, with diabetic polyneuropathy.  Resume glimepiride 4 mg, add sliding scale insulin, check hemoglobin A1c.  HLD: Continue pravastatin  Class II obesity with BMI 36.9, never tested for OSA, will benefit with sleep eval.  Bilateral chronic knee pain: Pain control is on tramadol.  Body mass index is 36.92 kg/m.   Severity of Illness: The appropriate patient status for this patient is OBSERVATION. Observation status is judged to be reasonable and necessary in order to provide the required intensity of service to ensure the patient's safety. The patient's presenting symptoms with right rib pain, exam with deconditioning, pulmonary insufficiency in the context of their medical  condition is felt to place them at decreased risk for further clinical deterioration. Furthermore, it is anticipated that the patient will be medically stable for discharge from the hospital within 2 midnights of admission.     DVT prophylaxis: enoxaparin (LOVENOX) injection 40 mg Start: 04/20/20 1000 Code Status:   Code Status: Full Code  Family Communication: Admission, patients condition and plan of care including tests being ordered have been discussed with the patient and his wife at bedside who indicate understanding and agree with the plan and Code Status.  Consults called:  None  Antonieta Pert MD [facility]  If 7PM-7AM, please contact night-coverage [email or web address]  04/20/2020, 10:55 AM

## 2020-04-20 NOTE — ED Provider Notes (Signed)
  Provider Note MRN:  [id number]  Arrival date & time: 04/20/20    ED Course and Medical Decision Making  Assumed care from Dr. Wyvonnia Dusky at shift change.  Recent mechanical fall, imaging reassuring but with oxygen saturations in the low 90s, will ambulate and reassess.  On my reassessment patient continues to be mildly tachypneic, oxygen saturation while sitting at rest 88%.  Feels too unwell and with too much pain to attempt to ambulate.  Will admit to hospital service for observation for pain control.  Procedures  Final Clinical Impressions(s) / ED Diagnoses     ICD-10-CM   1. Contusion of rib on right side, initial encounter  S20.211A     ED Discharge Orders    None        Barth Kirks. [place]onia Small, [place] [email or web address]    Maudie Flakes, MD 04/20/20 [phone]

## 2020-04-20 NOTE — ED Triage Notes (Signed)
Patient fell Tuesday, hit right side of body, did not hit head, is not on blood thinners. Patient got x-ray done yesterday, has not gotten results yet. Patient is AxOx4, in no apparent distress.

## 2020-04-20 NOTE — Evaluation (Signed)
Occupational Therapy Evaluation Patient Details Name: Edwin Osborne MRN: [id number] DOB: 1935-01-24 Today's Date: 04/20/2020    History of Present Illness Edwin Osborne is a 85 y.o. male with medical history significant for CKD stage III, diabetes, hypertension, hyperlipidemia, chronic knee pain to the ED for evaluation of pain in the right-sided rib and flank after patient had fall 2 nights ago against a wooden fence post.   Clinical Impression   Edwin Osborne is an 85 year old man s/p fall who presents with significant right sided pain and cough limiting his ability to perform normal mobility and ADLs. Patient needing assistance of two to transfer and patient did not want to attempt to stand today due to pain. Patient needing assistance to donn upper and lower body clothing and using urinal due to complaints of pain with movement and use of right upper extremity (referred pain down to side). Patient will benefit skilled OT services to improve activity tolerance and learn compensatory strategies as needed in order to improve functional abilities on return home. Do no expect he will need OT services at home.    Follow Up Recommendations  No OT follow up    Equipment Recommendations  None recommended by OT    Recommendations for Other Services       Precautions / Restrictions Precautions Precautions: Fall Precaution Comments: urinary urgency      Mobility Bed Mobility Overal bed mobility: Needs Assistance Bed Mobility: Rolling;Sidelying to Sit;Sit to Supine Rolling: Mod assist;+2 for physical assistance;+2 for safety/equipment Sidelying to sit: +2 for safety/equipment;+2 for physical assistance;Mod assist   Sit to supine: Max assist;+2 for physical assistance   General bed mobility comments: patient flexes knees in prep to roll, able to rech for rail, mod assistance to complete roll. Assistance with legs and trunk to sit upright. assistance for legs onto bed, HOB in  upright poition so trunk support not needed.  Transfers                 General transfer comment: NT, on stretcher    Balance Overall balance assessment: Needs assistance;History of Falls Sitting-balance support: Bilateral upper extremity supported;Feet supported Sitting balance-Leahy Scale: Fair Sitting balance - Comments: supports self in midline       Standing balance comment: Patient did not want to attempt. Reports too much pain.                           ADL either performed or assessed with clinical judgement   ADL Overall ADL's : Needs assistance/impaired Eating/Feeding: Independent   Grooming: Independent   Upper Body Bathing: Moderate assistance   Lower Body Bathing: Maximal assistance   Upper Body Dressing : Moderate assistance   Lower Body Dressing: +2 for physical assistance;Total assistance     Toilet Transfer Details (indicate cue type and reason): unable Toileting- Clothing Manipulation and Hygiene: Total assistance Toileting - Clothing Manipulation Details (indicate cue type and reason): needed assistance to place the urinal             Vision   Vision Assessment?: Vision impaired- to be further tested in functional context     Perception     Praxis      Pertinent Vitals/Pain Pain Assessment: Faces Faces Pain Scale: Hurts worst Pain Location: right side chest when coughing and rolling Pain Descriptors / Indicators: Grimacing;Guarding;Moaning;Jabbing Pain Intervention(s): Limited activity within patient's tolerance     Hand Dominance  Extremity/Trunk Assessment Upper Extremity Assessment Upper Extremity Assessment: LUE deficits/detail;RUE deficits/detail RUE Deficits / Details: Functional but unable to test due to patient's right sided pain. RUE: Unable to fully assess due to pain RUE Sensation: WNL RUE Coordination: WNL LUE Deficits / Details: Functional ROM/strength   Lower Extremity Assessment Lower  Extremity Assessment: Defer to PT evaluation   Cervical / Trunk Assessment Cervical / Trunk Assessment: Kyphotic   Communication Communication Communication: No difficulties   Cognition Arousal/Alertness: Awake/alert Behavior During Therapy: WFL for tasks assessed/performed Overall Cognitive Status: Within Functional Limits for tasks assessed                                     General Comments       Exercises     Shoulder Instructions      Home Living Family/patient expects to be discharged to:: Private residence Living Arrangements: Spouse/significant other Available Help at Discharge: Family Type of Home: House Home Access: Stairs to enter Technical brewer of Steps: 3-4   Home Layout: One level     Bathroom Shower/Tub: Tub/shower unit;Walk-in shower         Home Equipment: Shower seat - built in;Cane - single point          Prior Functioning/Environment Level of Independence: Independent        Comments: drives, weed eats        OT Problem List: Decreased activity tolerance;Pain;Decreased knowledge of use of DME or AE;Decreased strength      OT Treatment/Interventions: Self-care/ADL training;Therapeutic exercise;Therapeutic activities;DME and/or AE instruction;Patient/family education    OT Goals(Current goals can be found in the care plan section) Acute Rehab OT Goals Patient Stated Goal: to go home, OT Goal Formulation: With patient Time For Goal Achievement: 05/04/20 Potential to Achieve Goals: Good  OT Frequency: Min 2X/week   Barriers to D/C:            Co-evaluation              AM-PAC OT "6 Clicks" Daily Activity     Outcome Measure Help from another person eating meals?: None Help from another person taking care of personal grooming?: A Little Help from another person toileting, which includes using toliet, bedpan, or urinal?: A Lot Help from another person bathing (including washing, rinsing, drying)?: A  Lot Help from another person to put on and taking off regular upper body clothing?: A Lot Help from another person to put on and taking off regular lower body clothing?: Total 6 Click Score: 14   End of Session Nurse Communication: Mobility status  Activity Tolerance: Patient limited by pain Patient left: in bed;with call bell/phone within reach;with family/visitor present  OT Visit Diagnosis: Pain;History of falling (Z91.81) Pain - Right/Left: Right Pain - part of body:  (right side)                Time: 2876-8115 OT Time Calculation (min): 37 min Charges:  OT General Charges $OT Visit: 1 Visit OT Evaluation $OT Eval Moderate Complexity: 1 Mod  Nguyet Mercer, OTR/L [place]  Office [phone] Pager: [phone]   Edwin Osborne 04/20/2020, 5:15 PM

## 2020-04-20 NOTE — ED Notes (Signed)
Unable to give meds at this time due to pharmacy verification.

## 2020-04-20 NOTE — ED Notes (Signed)
Pt provided with meal tray and assistance setting up

## 2020-04-20 NOTE — ED Provider Notes (Signed)
[facility] DEPT Provider Note   CSN: [id number] Arrival date & time: 04/20/20  0230     History Chief Complaint  Patient presents with  . Flank Pain    Edwin Osborne is a 85 y.o. male.  Patient here by EMS with right-sided rib and flank pain.  States he had a fall 2 nights ago against a wooden fence post.  States he was "doing too many things and and I'm 85 years old".  Denies any preceding dizziness or lightheadedness.  Did not hit his head.  Complains of pain to his right lateral ribs and flank.  He saw his PCP today and had an x-ray but does not know the results.  He comes in tonight because he has worsening pain and it hurts to move and hurts to breathe.  Does feel somewhat short of breath.  No cough or fever.  No abdominal pain.  No vomiting.  Denies any blood thinner use other than aspirin. No focal weakness, numbness or tingling.  No oxygen use at home  The history is provided by the patient and the EMS personnel.  Flank Pain Associated symptoms include abdominal pain. Pertinent negatives include no chest pain, no headaches and no shortness of breath.       Past Medical History:  Diagnosis Date  . Arthritis   . BPH (benign prostatic hyperplasia)   . Cataract   . CKD (chronic kidney disease), stage III   . Depression    "usually a happy go lucky"  . Diabetes mellitus   . Diverticulosis   . GERD (gastroesophageal reflux disease)    sometimes will take omeprazole  . Hyperlipidemia   . Hypertension     Patient Active Problem List   Diagnosis Date Noted  . Thoracic aortic atherosclerosis ([place]) 09/26/2019  . Bilateral chronic knee pain 08/15/2019  . Class 2 severe obesity due to excess calories with serious comorbidity and body mass index (BMI) of 36.0 to 36.9 in adult ([place]) 11/02/2018  . Gluttony 03/16/2017  . Diabetic sensory polyneuropathy ([place]) 01/18/2016  . Stage 3 chronic kidney disease due to type 2 diabetes mellitus ([place])  03/07/2014  . Medication management 03/07/2014  . Non compliance with medical treatment 03/07/2014  . Hyperlipidemia associated with type 2 diabetes mellitus ([place]) 08/15/2013  . Vitamin D deficiency 08/15/2013  . Type 2 diabetes mellitus with stage 3 chronic kidney disease, without long-term current use of insulin ([place]) 07/28/2013  . Essential hypertension   . History of colonic polyps 04/09/2004    Past Surgical History:  Procedure Laterality Date  . CATARACT EXTRACTION, BILATERAL Bilateral 2020   Dr. Einar Gip  . CHOLECYSTECTOMY N/A 11/29/2015   Procedure: LAPAROSCOPIC CHOLECYSTECTOMY WITH INTRAOPERATIVE CHOLANGIOGRAM;  Surgeon: Erroll Luna, MD;  Location: [facility] OR;  Service: General;  Laterality: N/A;  . COLONOSCOPY  2005, 08/21/11   2005 tubulovillous adenoma and adenomas 2012 - 3 adenomas, largest 10 mm, diverticulosis  . FRACTURE SURGERY     ? right foot as a child  . KNEE ARTHROSCOPY    . TONSILLECTOMY AND ADENOIDECTOMY    . VEIN LIGATION AND STRIPPING     left       Family History  Problem Relation Age of Onset  . Prostate cancer Brother   . Diabetes Brother   . Colon cancer Neg Hx     Social History   Tobacco Use  . Smoking status: Current Some Day Smoker    Types: Cigars    Last attempt  to quit: 09/23/1987    Years since quitting: 32.5  . Smokeless tobacco: Never Used  . Tobacco comment: once monthly  Substance Use Topics  . Alcohol use: Yes    Comment: once monthly  . Drug use: No    Home Medications Prior to Admission medications   Medication Sig Start Date End Date Taking? Authorizing Provider  aspirin EC 81 MG tablet Take 81 mg by mouth daily.    [provider]  Blood Glucose Monitoring Suppl (ONE TOUCH ULTRA 2) w/Device KIT check blood sugar 1 time daily-E11.21 11/25/19   Unk Pinto, MD  Cinnamon 500 MG TABS Take 1,000 mg by mouth.    [provider]  doxazosin (CARDURA) 8 MG tablet Take 1 tablet at Bedtime for BP &  Prostate Patient taking differently: Take 1/2 tablet at Bedtime for BP & Prostate 01/05/20   Unk Pinto, MD  famotidine (PEPCID) 40 MG tablet Take 1 tablet Daily for Indigestion & Heartburn 08/08/19   Unk Pinto, MD  glimepiride (AMARYL) 4 MG tablet TAKE 1 TABLET BY MOUTH WITH BREAKFAST. IF FASTING GLUCOSE REMAINS ABOVE 150 IN THE MORNING TAKE ONE-HALF TABLET WITH DINNER. ONLY TAKE IF EATING. 03/09/20   Liane Comber, NP  glucose blood (ONETOUCH ULTRA) test strip Check blood sugasr 1 time daily-E11.21 11/25/19   Unk Pinto, MD  hydrochlorothiazide (HYDRODIURIL) 25 MG tablet Take 1 tablet Daily for BP & Fluid / Swelling 11/09/19   Unk Pinto, MD  Lancets [facility] ULTRASOFT) lancets Check blood sugar 1 time daily-E11.21 11/25/19   Unk Pinto, MD  lisinopril (ZESTRIL) 40 MG tablet Take 1 tablet Daily for BP & Diabetic Kidney Protection 11/30/19   Unk Pinto, MD  magnesium oxide (MAG-OX) 400 MG tablet Take 400 mg by mouth daily.    [provider]  Multiple Vitamins-Minerals (MULTIVITAMIN WITH MINERALS) tablet Take 1 tablet by mouth daily.      [provider]  pravastatin (PRAVACHOL) 40 MG tablet TAKE 1 TABLET BY MOUTH AT BEDTIME FOR CHOLESTEROL 08/24/19   Unk Pinto, MD  traMADol (ULTRAM) 50 MG tablet Take 1 tablet (50 mg total) by mouth every 6 (six) hours as needed. 04/19/20 04/19/21  Garnet Sierras, NP  VITAMIN D PO Take 5,000 Units by mouth 2 (two) times daily.    [provider]    Allergies    Patient has no known allergies.  Review of Systems   Review of Systems  Constitutional: Negative for activity change, appetite change, fatigue and fever.  HENT: Negative for congestion and rhinorrhea.   Respiratory: Negative for cough, chest tightness and shortness of breath.   Cardiovascular: Negative for chest pain.  Gastrointestinal: Positive for abdominal pain. Negative for nausea and vomiting.  Genitourinary: Positive for flank  pain.  Musculoskeletal: Positive for arthralgias and myalgias. Negative for back pain.  Skin: Negative for rash.  Neurological: Negative for dizziness, weakness and headaches.   all other systems are negative except as noted in the HPI and PMH.    Physical Exam Updated Vital Signs BP (!) 147/66 (BP Location: Right Arm)   Pulse 90   Temp 98.6 F (37 C) (Oral)   Resp 18   Ht '5\' 9"'$  (1.753 m)   Wt (!) 113.4 kg   SpO2 97%   BMI 36.92 kg/m   Physical Exam Vitals and nursing note reviewed.  Constitutional:      General: He is not in acute distress.    Appearance: Normal appearance. He is well-developed. He is obese.  HENT:     Head: Normocephalic and atraumatic.     Mouth/Throat:     Pharynx: No oropharyngeal exudate.  Eyes:     Conjunctiva/sclera: Conjunctivae normal.     Pupils: Pupils are equal, round, and reactive to light.  Neck:     Comments: No meningismus. Cardiovascular:     Rate and Rhythm: Normal rate and regular rhythm.     Heart sounds: Normal heart sounds. No murmur heard.   Pulmonary:     Effort: Pulmonary effort is normal. No respiratory distress.     Breath sounds: Normal breath sounds.     Comments: Right lateral rib tenderness, no crepitus Chest:     Chest wall: Tenderness present.  Abdominal:     Palpations: Abdomen is soft.     Tenderness: There is abdominal tenderness. There is no guarding or rebound.     Comments: Right-sided abdominal tenderness, flank tenderness, no ecchymosis  Musculoskeletal:        General: No tenderness. Normal range of motion.     Cervical back: Normal range of motion and neck supple.     Comments:  Full range of motion hips without pain bilaterally  Skin:    General: Skin is warm.     Comments: Abrasion right upper inner arm  Neurological:     Mental Status: He is alert and oriented to person, place, and time.     Cranial Nerves: No cranial nerve deficit.     Motor: No abnormal muscle tone.     Coordination:  Coordination normal.     Comments: No ataxia on finger to nose bilaterally. No pronator drift. 5/5 strength throughout. CN 2-12 intact.Equal grip strength. Sensation intact.   Psychiatric:        Behavior: Behavior normal.     ED Results / Procedures / Treatments   Labs (all labs ordered are listed, but only abnormal results are displayed) Labs Reviewed  CBC WITH DIFFERENTIAL/PLATELET - Abnormal; Notable for the following components:      Result Value   Monocytes Absolute 1.2 (*)    All other components within normal limits  BASIC METABOLIC PANEL - Abnormal; Notable for the following components:   Glucose, Bld 268 (*)    BUN 39 (*)    Creatinine, Ser 2.25 (*)    GFR calc non Af Amer 26 (*)    GFR calc Af Amer 30 (*)    All other components within normal limits  URINALYSIS, ROUTINE W REFLEX MICROSCOPIC - Abnormal; Notable for the following components:   Glucose, UA >=500 (*)    All other components within normal limits  SARS CORONAVIRUS 2 BY RT PCR (HOSPITAL ORDER, PERFORMED IN [facility] LAB)    EKG EKG Interpretation  Date/Time:  Friday April 20 2020 03:14:02 EDT Ventricular Rate:  86 PR Interval:    QRS Duration: 164 QT Interval:  390 QTC Calculation: 467 R Axis:   106 Text Interpretation: Sinus rhythm Borderline prolonged PR interval RBBB and LPFB Confirmed by Kennis Carina [phone]) on 04/20/2020 7:16:23 AM   Radiology CT ABDOMEN PELVIS WO CONTRAST  Result Date: 04/20/2020 CLINICAL DATA:  Pain following recent fall EXAM: CT CHEST, ABDOMEN AND PELVIS WITHOUT CONTRAST TECHNIQUE: Multidetector CT imaging of the chest, abdomen and pelvis was performed following the standard protocol without IV contrast. COMPARISON:  CT abdomen and pelvis October 21, 2016 FINDINGS: CT CHEST FINDINGS Cardiovascular: There is no evident fluid or apparent hemorrhage in the periaortic/mediastinal region. There is no thoracic aortic  aneurysm. There are scattered foci of calcification in  visualized great vessels. There is aortic atherosclerosis. There are foci of coronary artery calcification. There is no appreciable pericardial effusion or pericardial thickening. Mediastinum/Nodes: Thyroid appears normal. There is no appreciable thoracic adenopathy. No esophageal lesions are evident. Lungs/Pleura: There is no appreciable pneumothorax. There are areas of patchy atelectatic change, primarily in the lung bases. There are occasional calcified granulomas. There is no appreciable edema or airspace opacity to suggest pneumonia. There are multiple foci of pleural calcification. No appreciable pleural effusions. Musculoskeletal: There is degenerative change in the thoracic spine with a degree of diffuse idiopathic skeletal hyperostosis. There is arthropathy with vacuum phenomenon in each sternoclavicular joint. There is no demonstrable fracture or dislocation. No blastic or lytic bone lesions. No evident chest wall lesions. CT ABDOMEN PELVIS FINDINGS Hepatobiliary: No perihepatic fluid noted. No focal liver lesions are evident on this noncontrast enhanced study. No overt liver laceration or rupture noted. Gallbladder is absent. There is no biliary duct dilatation. Pancreas: No pancreatic mass or inflammatory focus. No peripancreatic fluid. Spleen: No perisplenic fluid. No abnormal attenuation in the spleen to suggest potential laceration or rupture on noncontrast enhanced study. No splenic lesions evident. Adrenals/Urinary Tract: Adrenals bilaterally appear normal. There is a cyst in the lower pole of the left kidney measuring 2.7 x 2.4 cm. There is no appreciable hydronephrosis on either side. Perinephric thickening bilaterally is stable compared to the 2018 study. There is not felt to be perinephric fluid on either side currently. There is no evident renal or ureteral calculus on either side. Urinary bladder is midline with wall thickness within normal limits. Stomach/Bowel: There is no appreciable bowel  wall or mesenteric thickening. There is no evident bowel obstruction. Terminal ileum appears normal. No evident free air or portal venous air. Vascular/Lymphatic: No abdominal aortic aneurysm. There is aortic and common iliac artery atherosclerosis. No periaortic fluid. No adenopathy appreciable in the abdomen or pelvis. Reproductive: Prostate is prominent, similar in appearance to prior study. Prostate abuts and impresses upon the inferior aspect of the urinary bladder. Seminal vesicles appear normal bilaterally. Other: Appendix appears normal. No abscess or ascites in the abdomen or pelvis. No loculated fluid collections evident in the abdomen or pelvis. Musculoskeletal: No appreciable fracture or dislocation. There is degenerative change in the lumbar spine. No blastic or lytic bone lesions. There is no intramuscular or abdominal wall lesions. IMPRESSION: Chest CT: 1. No pneumothorax. Areas of atelectatic change bilaterally. Areas of pleural calcification may represent residua from asbestos exposure. Occasional lung granulomas noted. No edema or consolidation. 2. Aortic atherosclerosis. Foci of great vessel and coronary artery calcification noted. No mediastinal hemorrhage or fluid noted. Note that evaluation of the aorta is limited without intravenous contrast. 3.  No evident adenopathy. CT abdomen and pelvis: 1. No acute traumatic appearing lesion evident on this noncontrast enhanced study. 2. No bowel wall thickening or bowel obstruction. No abscess in the abdomen or pelvis. Appendix appears normal. 3. Prominence of the prostate is stable. Prostate abuts the inferior either. 4.  Gallbladder absent. 5.  Aortic Atherosclerosis (ICD10-I70.0). Electronically Signed   By: Bretta Bang III M.D.   On: 04/20/2020 06:40   DG Chest 2 View  Result Date: 04/19/2020 CLINICAL DATA:  Fall EXAM: CHEST - 2 VIEW COMPARISON:  01/16/2014 FINDINGS: Linear scarring or atelectasis at the left base. No acute consolidation or  effusion. Normal heart size. No pneumothorax. IMPRESSION: No active cardiopulmonary disease. Electronically Signed   By: Jasmine Pang  M.D.   On: 04/19/2020 19:32   CT Chest Wo Contrast  Result Date: 04/20/2020 CLINICAL DATA:  Pain following recent fall EXAM: CT CHEST, ABDOMEN AND PELVIS WITHOUT CONTRAST TECHNIQUE: Multidetector CT imaging of the chest, abdomen and pelvis was performed following the standard protocol without IV contrast. COMPARISON:  CT abdomen and pelvis October 21, 2016 FINDINGS: CT CHEST FINDINGS Cardiovascular: There is no evident fluid or apparent hemorrhage in the periaortic/mediastinal region. There is no thoracic aortic aneurysm. There are scattered foci of calcification in visualized great vessels. There is aortic atherosclerosis. There are foci of coronary artery calcification. There is no appreciable pericardial effusion or pericardial thickening. Mediastinum/Nodes: Thyroid appears normal. There is no appreciable thoracic adenopathy. No esophageal lesions are evident. Lungs/Pleura: There is no appreciable pneumothorax. There are areas of patchy atelectatic change, primarily in the lung bases. There are occasional calcified granulomas. There is no appreciable edema or airspace opacity to suggest pneumonia. There are multiple foci of pleural calcification. No appreciable pleural effusions. Musculoskeletal: There is degenerative change in the thoracic spine with a degree of diffuse idiopathic skeletal hyperostosis. There is arthropathy with vacuum phenomenon in each sternoclavicular joint. There is no demonstrable fracture or dislocation. No blastic or lytic bone lesions. No evident chest wall lesions. CT ABDOMEN PELVIS FINDINGS Hepatobiliary: No perihepatic fluid noted. No focal liver lesions are evident on this noncontrast enhanced study. No overt liver laceration or rupture noted. Gallbladder is absent. There is no biliary duct dilatation. Pancreas: No pancreatic mass or inflammatory  focus. No peripancreatic fluid. Spleen: No perisplenic fluid. No abnormal attenuation in the spleen to suggest potential laceration or rupture on noncontrast enhanced study. No splenic lesions evident. Adrenals/Urinary Tract: Adrenals bilaterally appear normal. There is a cyst in the lower pole of the left kidney measuring 2.7 x 2.4 cm. There is no appreciable hydronephrosis on either side. Perinephric thickening bilaterally is stable compared to the 2018 study. There is not felt to be perinephric fluid on either side currently. There is no evident renal or ureteral calculus on either side. Urinary bladder is midline with wall thickness within normal limits. Stomach/Bowel: There is no appreciable bowel wall or mesenteric thickening. There is no evident bowel obstruction. Terminal ileum appears normal. No evident free air or portal venous air. Vascular/Lymphatic: No abdominal aortic aneurysm. There is aortic and common iliac artery atherosclerosis. No periaortic fluid. No adenopathy appreciable in the abdomen or pelvis. Reproductive: Prostate is prominent, similar in appearance to prior study. Prostate abuts and impresses upon the inferior aspect of the urinary bladder. Seminal vesicles appear normal bilaterally. Other: Appendix appears normal. No abscess or ascites in the abdomen or pelvis. No loculated fluid collections evident in the abdomen or pelvis. Musculoskeletal: No appreciable fracture or dislocation. There is degenerative change in the lumbar spine. No blastic or lytic bone lesions. There is no intramuscular or abdominal wall lesions. IMPRESSION: Chest CT: 1. No pneumothorax. Areas of atelectatic change bilaterally. Areas of pleural calcification may represent residua from asbestos exposure. Occasional lung granulomas noted. No edema or consolidation. 2. Aortic atherosclerosis. Foci of great vessel and coronary artery calcification noted. No mediastinal hemorrhage or fluid noted. Note that evaluation of  the aorta is limited without intravenous contrast. 3.  No evident adenopathy. CT abdomen and pelvis: 1. No acute traumatic appearing lesion evident on this noncontrast enhanced study. 2. No bowel wall thickening or bowel obstruction. No abscess in the abdomen or pelvis. Appendix appears normal. 3. Prominence of the prostate is stable. Prostate abuts the  inferior either. 4.  Gallbladder absent. 5.  Aortic Atherosclerosis (ICD10-I70.0). Electronically Signed   By: Lowella Grip III M.D.   On: 04/20/2020 06:40   DG Humerus Right  Result Date: 04/20/2020 CLINICAL DATA:  Recent fall with arm pain, initial encounter EXAM: RIGHT HUMERUS - 2+ VIEW COMPARISON:  None. FINDINGS: There is no evidence of fracture or other focal bone lesions. Soft tissues are unremarkable. IMPRESSION: No acute fracture noted. Electronically Signed   By: [name] M.D.   On: 04/20/2020 03:40    Procedures Procedures (including critical care time)  Medications Ordered in ED Medications  fentaNYL (SUBLIMAZE) injection 25 mcg (has no administration in time range)    ED Course  I have reviewed the triage vital signs and the nursing notes.  Pertinent labs & imaging results that were available during my care of the patient were reviewed by me and considered in my medical decision making (see chart for details).    MDM Rules/Calculators/A&P                         Fall 2 days ago with R rib pain.  Vital stable, no distress, equal breath sounds.  Chest x-ray from earlier today was viewed and shows no abnormalities.  No pneumothorax or rib fracture based on chest x-ray done as outpatient.  EKG is unchanged.  Patient with no crepitus.  Equal breath sounds.  Labs show slightly worse creatinine 2.2 from baseline of 1.9.  Urinalysis negative for hematuria.  With ongoing right lateral rib and abdominal pain, CT imaging obtained without contrast.  This shows no acute traumatic injury.  No rib fracture or pneumothorax.  No  intra-abdominal hemorrhage.  Patient still with ongoing significant right and right lateral rib pain.  He has been on oxygen throughout his ED stay.  This is removed his oxygen level drops down to 93-94% range. He continues to complain of splinting rib pain and coughing.  We will assess ability to ambulate without hypoxia.  May need admission if becomes hypoxic with ambulation. CT done without contrast due to kidney function, PE still on differential. Covid test will be sent.  ED ECG REPORT   Date: 04/20/2020  Rate: 86  Rhythm: normal sinus rhythm  QRS Axis: normal  Intervals: normal  ST/T Wave abnormalities: normal  Conduction Disutrbances:right bundle branch block  Narrative Interpretation:   Old EKG Reviewed: unchanged  I have personally reviewed the EKG tracing and agree with the computerized printout as noted.  Final Clinical Impression(s) / ED Diagnoses Final diagnoses:  Contusion of rib on right side, initial encounter    Rx / DC Orders ED Discharge Orders    None       Aliza Moret, Annie Main, MD 04/20/20 [phone]

## 2020-04-20 NOTE — Progress Notes (Signed)
Entered room to administer nebulizer treatment.  Patient and family member stated that he wanted to leave and go home.  Advised that it would be against medical advice and both parties stated that was fine.  Nebulizer treatment refused at this time.  RN and charge RN notified of refusal of treatment and request to leave AMA.

## 2020-04-22 DIAGNOSIS — J209 Acute bronchitis, unspecified: Secondary | ICD-10-CM | POA: Diagnosis not present

## 2020-04-22 DIAGNOSIS — J9811 Atelectasis: Secondary | ICD-10-CM | POA: Diagnosis not present

## 2020-04-22 DIAGNOSIS — R05 Cough: Secondary | ICD-10-CM | POA: Diagnosis not present

## 2020-04-26 ENCOUNTER — Other Ambulatory Visit: Payer: Self-pay

## 2020-04-26 ENCOUNTER — Ambulatory Visit (INDEPENDENT_AMBULATORY_CARE_PROVIDER_SITE_OTHER): Payer: Medicare Other | Admitting: Adult Health

## 2020-04-26 ENCOUNTER — Ambulatory Visit: Payer: Medicare Other | Admitting: Adult Health

## 2020-04-26 ENCOUNTER — Encounter: Payer: Self-pay | Admitting: Adult Health

## 2020-04-26 VITALS — BP 124/72 | HR 92 | Temp 97.3°F | Wt 242.0 lb

## 2020-04-26 DIAGNOSIS — R0600 Dyspnea, unspecified: Secondary | ICD-10-CM

## 2020-04-26 DIAGNOSIS — R0789 Other chest pain: Secondary | ICD-10-CM

## 2020-04-26 DIAGNOSIS — E1121 Type 2 diabetes mellitus with diabetic nephropathy: Secondary | ICD-10-CM

## 2020-04-26 DIAGNOSIS — R05 Cough: Secondary | ICD-10-CM

## 2020-04-26 DIAGNOSIS — N1832 Chronic kidney disease, stage 3b: Secondary | ICD-10-CM

## 2020-04-26 DIAGNOSIS — E1122 Type 2 diabetes mellitus with diabetic chronic kidney disease: Secondary | ICD-10-CM

## 2020-04-26 DIAGNOSIS — R5383 Other fatigue: Secondary | ICD-10-CM | POA: Diagnosis not present

## 2020-04-26 DIAGNOSIS — J209 Acute bronchitis, unspecified: Secondary | ICD-10-CM

## 2020-04-26 DIAGNOSIS — R600 Localized edema: Secondary | ICD-10-CM | POA: Diagnosis not present

## 2020-04-26 DIAGNOSIS — R0781 Pleurodynia: Secondary | ICD-10-CM

## 2020-04-26 DIAGNOSIS — R0609 Other forms of dyspnea: Secondary | ICD-10-CM

## 2020-04-26 DIAGNOSIS — R059 Cough, unspecified: Secondary | ICD-10-CM

## 2020-04-26 DIAGNOSIS — N179 Acute kidney failure, unspecified: Secondary | ICD-10-CM

## 2020-04-26 MED ORDER — BENZONATATE 200 MG PO CAPS: ORAL_CAPSULE | ORAL | 1 refills | Status: DC

## 2020-04-26 MED ORDER — BREZTRI AEROSPHERE 160-9-4.8 MCG/ACT IN AERO: 1.0000 | INHALATION_SPRAY | Freq: Two times a day (BID) | RESPIRATORY_TRACT | 0 refills | Status: DC

## 2020-04-26 NOTE — Progress Notes (Signed)
Assessment and Plan:  Edwin Osborne was seen today for fatigue.  Diagnoses and all orders for this visit:  Bilateral lower extremity edema With some fatigue, cough Exertional dyspnea consistent with baseline Improved from yesterday per patient and family;  Wife was concerned about possible new CHF; on review of history and current sx, suspicion is fairly low but will check BMP Had recent normal EKG other than RBBB Possible r/t inactivity or acute renal change - elevate legs TID, increase activity, increase water, decrease sodium intake.  Wear compression socks more routinely if available. Return to the office if no change with symptoms. Check labs. If persistent but unexplained consider ECHO -     CBC with Differential/Platelet -     COMPLETE METABOLIC PANEL WITH GFR -     Urinalysis, Routine w reflex microscopic -     Brain natriuretic peptide  Exertional dyspnea Per patient consistent with baseline; however will check BNP to help r/o CHF -     CBC with Differential/Platelet -     Brain natriuretic peptide  Type 2 diabetes mellitus with stage 3b chronic kidney disease, without long-term current use of insulin (HCC) -     COMPLETE METABOLIC PANEL WITH GFR  AKI (acute kidney injury) ([place]) -     COMPLETE METABOLIC PANEL WITH GFR -     Urinalysis, Routine w reflex microscopic  Rib pain Negative CXR; chest wall bruising Can do ice, rest, tyelnol, voltaren, icy hot  Acute bronchitis, unspecified organism Slowly improving sx; had normal CXR, CT in ED, improving sx since then Will give tessalon for breakthrough cough Inhaler sample x 2 weeks given; BID; neb PRN; follow up if secretions getting worse, new fever/chills, O2 going down and will recheck CXR Did just finish zpak -     Budeson-Glycopyrrol-Formoterol (BREZTRI AEROSPHERE) 160-9-4.8 MCG/ACT AERO; Inhale 1 puff into the lungs 2 (two) times daily. -     benzonatate (TESSALON) 200 MG capsule; Take 1 tab three times a day as needed for  cough.   Further disposition pending results of labs. Discussed med's effects and SE's.   Over 30 minutes of exam, counseling, chart review, and critical decision making was performed.   Future Appointments  Date Time Provider [place]  05/21/2020  4:00 PM Unk Pinto, MD [facility] None  06/26/2020  9:00 AM Garnet Sierras, NP [facility] None  09/20/2020  9:00 AM Liane Comber, NP [facility] None    ------------------------------------------------------------------------------------------------------------------   HPI BP 124/72   Pulse 92   Temp (!) 97.3 F (36.3 C)   Wt 242 lb (109.8 kg)   SpO2 92%   BMI 35.74 kg/m   85 y.o.male with morbid obesity, T2DM, CKD IIIb with AKI, recent hx of fall with rib contusion and bronchitis (per ED visit 04/20/2020 - He had CXR 7/29 and CT chest w/o contrast 7/30 in ED without acute findings) presents for evaluation of swelling in bilateral ankles. Accompanied by his daughter today.    His wife called in yesterday reporting new onset bilateral profound ankle swelling, concerned about new onset heart failure due to fatigue, cough and new bilateral ankle edema (she is a retired [employer] scientist). Presents today for evaluation, reports edema has improved significantly. Reports intermittent hacking but productive cough (some thick yellow secretions - taking mucinex DM), improving gradually. He was diagnosed with acute bronchitis in ED, but He endorses some fatigue. Reports exertional dyspnea but consistent with baseline. Denies PND. Denies fever/chills. Denies chest aching or pain other than over bruised ribs. [place]  has O2 sat at home, maintaining 92%. Admits avoiding deep breathing or ambulation due to chest wall pain.   His blood pressure has been controlled at home, today their BP is BP: 124/72  Currently taking lisinopril 40 mg daily, HCTZ 25 mg daily, doxazosin 4 mg   He does not have hx of CHF. No ECHO on record. He had EKG at recent ED  visit, NSR with RBBB.  Denies orthopnea and paroxysmal nocturnal dyspnea. Positive for fatigue and lower extremity edema. Weights are down. Doesn't check regularly at home.  Wt Readings from Last 3 Encounters:  04/26/20 242 lb (109.8 kg)  04/20/20 (!) 250 lb (113.4 kg)  04/19/20 (!) 250 lb 12.8 oz (113.8 kg)   He admits to poor diabetic compliance; reports glucose was running 200-300, but in last few checks has been fasting 130-160 range. UA in ED showed glucose 500+, no ketones or other abnormalities, serum glucose of 268. He takes glimepiride 4 mg BID. Denies low sugars. Denies polyuria or polydipsia. Last A1C in the office was:  Lab Results  Component Value Date   HGBA1C 9.5 (H) 03/23/2020   Lab Results  Component Value Date   GFRNONAA 26 (L) 04/20/2020   GFRNONAA 32 (L) 03/23/2020   GFRNONAA 34 (L) 11/24/2019   Lab Results  Component Value Date   CREATININE 2.25 (H) 04/20/2020   CREATININE 1.90 (H) 03/23/2020   CREATININE 1.81 (H) 11/24/2019   Recent UA at ED in 04/20/2020 was normal excepting glucose 500+     Past Medical History:  Diagnosis Date  . Arthritis   . BPH (benign prostatic hyperplasia)   . Cataract   . CKD (chronic kidney disease), stage III   . Depression    "usually a happy go lucky"  . Diabetes mellitus   . Diverticulosis   . GERD (gastroesophageal reflux disease)    sometimes will take omeprazole  . Hyperlipidemia   . Hypertension      No Known Allergies  Current Outpatient Medications on File Prior to Visit  Medication Sig  . aspirin EC 81 MG tablet Take 81 mg by mouth daily.  . Blood Glucose Monitoring Suppl (ONE TOUCH ULTRA 2) w/Device KIT check blood sugar 1 time daily-E11.21  . Cinnamon 500 MG TABS Take 1,000 mg by mouth.  . doxazosin (CARDURA) 8 MG tablet Take 1 tablet at Bedtime for BP & Prostate (Patient taking differently: Take 4 mg by mouth See admin instructions. Take 1/2 tablet at Bedtime for BP & Prostate)  . famotidine (PEPCID) 40  MG tablet Take 1 tablet Daily for Indigestion & Heartburn  . glimepiride (AMARYL) 4 MG tablet TAKE 1 TABLET BY MOUTH WITH BREAKFAST. IF FASTING GLUCOSE REMAINS ABOVE 150 IN THE MORNING TAKE ONE-HALF TABLET WITH DINNER. ONLY TAKE IF EATING. (Patient taking differently: Take 4 mg by mouth See admin instructions. TAKE 1 TABLET BY MOUTH WITH BREAKFAST. IF FASTING GLUCOSE REMAINS ABOVE 150 IN THE MORNING TAKE ONE-HALF TABLET WITH DINNER. ONLY TAKE IF EATING.)  . glucose blood (ONETOUCH ULTRA) test strip Check blood sugasr 1 time daily-E11.21  . hydrochlorothiazide (HYDRODIURIL) 25 MG tablet Take 1 tablet Daily for BP & Fluid / Swelling (Patient taking differently: Take 25 mg by mouth daily. for BP & Fluid / Swelling)  . Lancets (ONETOUCH ULTRASOFT) lancets Check blood sugar 1 time daily-E11.21  . lisinopril (ZESTRIL) 40 MG tablet Take 1 tablet Daily for BP & Diabetic Kidney Protection (Patient taking differently: Take 40 mg by mouth daily. for BP &  Diabetic Kidney Protection)  . magnesium oxide (MAG-OX) 400 MG tablet Take 400 mg by mouth daily.  . Multiple Vitamins-Minerals (MULTIVITAMIN WITH MINERALS) tablet Take 1 tablet by mouth daily.    . pravastatin (PRAVACHOL) 40 MG tablet TAKE 1 TABLET BY MOUTH AT BEDTIME FOR CHOLESTEROL (Patient taking differently: Take 20 mg by mouth daily. BEDTIME FOR CHOLESTEROL)  . traMADol (ULTRAM) 50 MG tablet Take 1 tablet (50 mg total) by mouth every 6 (six) hours as needed. (Patient taking differently: Take 50 mg by mouth every 6 (six) hours as needed for moderate pain. )  . VITAMIN D PO Take 5,000 Units by mouth 2 (two) times daily.   No current facility-administered medications on file prior to visit.    ROS: all negative except above.   Physical Exam:  BP 124/72   Pulse 92   Temp (!) 97.3 F (36.3 C)   Wt 242 lb (109.8 kg)   SpO2 92%   BMI 35.74 kg/m   General Appearance: Well nourished, obese elder male in no acute distress. Eyes: PERRLA, EOMs,  conjunctiva no swelling or erythema Sinuses: No Frontal/maxillary tenderness ENT/Mouth: Ext aud canals clear, TMs without erythema, bulging. No erythema, swelling, or exudate on post pharynx.  Tonsils not swollen or erythematous. Hearing normal.  Neck: Supple Respiratory: Respiratory effort normal, BS equal bilaterally without rales, rhonchi, wheezing or stridor. Single brief episode of dry cough while in office.  Cardio: RRR with no MRGs. Symmetrical 1+ pulses bilaterally, 1+ limited pitting edema localized to bilateral ankles.  Abdomen: Soft, obese abdomen + BS.  Non tender, no guarding, rebound, hernias, masses. Lymphatics: Non tender without lymphadenopathy.  Musculoskeletal: No obvious deformity; mild ecchymosis over R lateral chest wall without other palpable abnormality. Slow steady gait.  Skin: Warm, dry without rashes, lesions.  Neuro: Normal muscle tone, Sensation intact.  Psych: Awake and oriented X 3, normal affect, Insight and Judgment appropriate.     Izora Ribas, NP 3:19 PM [facility]

## 2020-04-26 NOTE — Patient Instructions (Addendum)
Recommend getting up and walking a few min every hour  IF can't walk, recommend ankle pumping/rotation exercises instead   Sit up and cough, do deep breathing every hour    Can reduce nebulized breathing treatments to just as needed  Do breztri inhaler sample instead - 1 puff twice daily until you run out  RINSE mouth and BRUSH TEETH after using this inhaler to prevent oral thrush     Edema  Edema is an abnormal buildup of fluids in the body tissues and under the skin. Swelling of the legs, feet, and ankles is a common symptom that becomes more likely as you get older. Swelling is also common in looser tissues, like around the eyes. When the affected area is squeezed, the fluid may move out of that spot and leave a dent for a few moments. This dent is called pitting edema. There are many possible causes of edema. Eating too much salt (sodium) and being on your feet or sitting for a long time can cause edema in your legs, feet, and ankles. Hot weather may make edema worse. Common causes of edema include:  Heart failure.  Liver or kidney disease.  Weak leg blood vessels.  Cancer.  An injury.  Pregnancy.  Medicines.  Being obese.  Low protein levels in the blood. Edema is usually painless. Your skin may look swollen or shiny. Follow these instructions at home:  Keep the affected body part raised (elevated) above the level of your heart when you are sitting or lying down.  Do not sit still or stand for long periods of time.  Do not wear tight clothing. Do not wear garters on your upper legs.  Exercise your legs to get your circulation going. This helps to move the fluid back into your blood vessels, and it may help the swelling go down.  Wear elastic bandages or support stockings to reduce swelling as told by your health care provider.  Eat a low-salt (low-sodium) diet to reduce fluid as told by your health care provider.  Depending on the cause of your swelling,  you may need to limit how much fluid you drink (fluid restriction).  Take over-the-counter and prescription medicines only as told by your health care provider. Contact a health care provider if:  Your edema does not get better with treatment.  You have heart, liver, or kidney disease and have symptoms of edema.  You have sudden and unexplained weight gain. Get help right away if:  You develop shortness of breath or chest pain.  You cannot breathe when you lie down.  You develop pain, redness, or warmth in the swollen areas.  You have heart, liver, or kidney disease and suddenly get edema.  You have a fever and your symptoms suddenly get worse. Summary  Edema is an abnormal buildup of fluids in the body tissues and under the skin.  Eating too much salt (sodium) and being on your feet or sitting for a long time can cause edema in your legs, feet, and ankles.  Keep the affected body part raised (elevated) above the level of your heart when you are sitting or lying down. This information is not intended to replace advice given to you by your health care provider. Make sure you discuss any questions you have with your health care provider. Document Revised: 01/26/2019 Document Reviewed: 10/11/2016 Elsevier Patient Education  [place].      Budesonide; Glycopyrrolate; Formoterol inhalation aerosol What is this medicine? BUDESONIDE; GLYCOPYRROLATE; FORMOTEROL (byoo  DES oh nide; glye koe PYE roe late; for [facility] te rol) inhalation is a combination of 3 medicines. Budesonide decreases inflammation in the lungs. Formoterol and glycopyrrolate are bronchodilators that help keep airways open. This medicine is used to treat chronic obstructive pulmonary disease (COPD). Do not use this drug combination for acute COPD attacks or bronchospasm. This medicine may be used for other purposes; ask your health care provider or pharmacist if you have questions. COMMON BRAND NAME(S):  BREZTRI What should I tell my health care provider before I take this medicine? They need to know if you have any of these conditions:  bladder problems or trouble passing urine  bone problems  diabetes  eye disease, vision problems  heart disease  high blood pressure  history of irregular heartbeat  immune system problems  infection  kidney disease  pheochromocytoma  prostate disease  seizures  thyroid disease  an unusual or allergic reaction to budesonide, formoterol, glycopyrrolate, medicines, foods, dyes, or preservatives  pregnant or trying to get pregnant  breast-feeding How should I use this medicine? This medicine is inhaled through the mouth. Follow the directions on the prescription label. Shake well before each use. Rinse your mouth with water after use. Make sure not to swallow the water. Take your medicine at regular intervals. Do not take your medicine more often than directed. Do not stop taking except on your doctor's advice. Make sure that you are using your inhaler correctly. This drug comes with INSTRUCTIONS FOR USE. Ask your pharmacist for directions on how to use this drug. Read the information carefully. Talk to your pharmacist or health care provider if you have questions. Talk to your pediatrician regarding the use of this medicine in children. This medicine is not approved for use in children. Overdosage: If you think you have taken too much of this medicine contact a poison control center or emergency room at once. NOTE: This medicine is only for you. Do not share this medicine with others. What if I miss a dose? If you miss a dose, use it as soon as you can. If it is almost time for your next dose, use only that dose. Do not use double or extra doses. What may interact with this medicine? Do not take the medicine with any of the following medications:  cisapride  dofetilide  dronedarone  MAOIs like Marplan, Nardil, and Parnate  other  medicines that contain long-acting beta-2 agonists (LABAs) like arformoterol, formoterol, indacaterol, olodaterol, salmeterol, vilanterol  pimozide  procarbazine  thioridazine This medicine may also interact with the following medications:  antihistamines for allergy, cough, and cold  atropine  certain antibiotics like clarithromycin, telithromycin  certain antivirals for HIV or hepatitis  certain heart medicines like atenolol, metoprolol  certain medicines for bladder problems like oxybutynin, tolterodine  certain medicines for blood pressure, heart disease, irregular heartbeat  certain medicines for depression, anxiety, or psychotic disturbances  certain medicines for fungal infections like ketoconazole, itraconazole  certain medicines for Parkinson's disease like benztropine, trihexyphenidyl  certain medicines for stomach problems like dicyclomine, hyoscyamine  certain medicines for travel sickness like scopolamine  diuretics  grapefruit juice  mifepristone  other inhaled medicines that contain anticholinergics such as aclidinium, ipratropium, tiotropium, umeclidinium  other medicines that prolong the QT interval (an abnormal heart rhythm)  some vaccines  steroid medicines like prednisone or cortisone  stimulant medicines for attention disorders, weight loss, or to stay awake  theophylline This list may not describe all possible interactions. Give your health  care provider a list of all the medicines, herbs, non-prescription drugs, or dietary supplements you use. Also tell them if you smoke, drink alcohol, or use illegal drugs. Some items may interact with your medicine. What should I watch for while using this medicine? Visit your health care professional for regular checks on your progress. Tell your health care professional if your symptoms do not start to get better or if they get worse. NEVER use this medicine for an acute asthma or COPD attack. You should  use your short-acting rescue inhalers for this purpose. If your symptoms get worse or if you need your short-acting inhalers more often, call your doctor right away. This medicine may increase your risk of getting an infection. Tell your doctor or health care professional if you are around anyone with measles or chickenpox, or if you develop sores or blisters that do not heal properly. Do not get this medicine in your eyes. It can cause irritation, pain, or blurred vision. What side effects may I notice from receiving this medicine? Side effects that you should report to your doctor or health care professional as soon as possible:  allergic reactions like skin rash, itching or hives, swelling of the face, lips, or tongue  anxious  breathing problems  changes in vision, eye pain  muscle cramps or muscle pain  signs and symptoms of a dangerous change in heartbeat or heart rhythm like chest pain; dizziness; fast or irregular heartbeat; palpitations; feeling faint or lightheaded, falls; breathing problems  signs and symptoms of high blood sugar such as being more thirsty or hungry or having to urinate more than normal. You may also feel very tired or have blurry vision  signs and symptoms of infection like fever; chills; cough; sore throat; pain or trouble passing urine  tremors  trouble passing urine or change in the amount of urine  unusually weak or tired  white patches in the mouth or mouth sores Side effects that usually do not require medical attention (report these to your doctor or health care professional if they continue or are bothersome):  back pain  changes in taste  cough  diarrhea  runny or stuffy nose  upset stomach This list may not describe all possible side effects. Call your doctor for medical advice about side effects. You may report side effects to FDA at 1-800-FDA-1088. Where should I keep my medicine? Keep out of the reach of children. Store in a dry  place at room temperature between 15 and 30 degrees C (59 and 86 degrees F). Protect from heat. Do not freeze. Do not use or store near heat or flame, as the canister may burst. Throw away the inhaler 3 months after you open the foil pouch (for the 120-inhalation canister), or 3 weeks after you open the foil pouch (for the 28-inhalation canister), or when the dose indicator reaches zero "0", whichever comes first. NOTE: This sheet is a summary. It may not cover all possible information. If you have questions about this medicine, talk to your doctor, pharmacist, or health care provider.  2020 Elsevier/Gold Standard (2019-04-19 18:58:00)

## 2020-04-27 ENCOUNTER — Other Ambulatory Visit: Payer: Self-pay | Admitting: Adult Health

## 2020-04-27 DIAGNOSIS — N289 Disorder of kidney and ureter, unspecified: Secondary | ICD-10-CM

## 2020-04-27 DIAGNOSIS — E1122 Type 2 diabetes mellitus with diabetic chronic kidney disease: Secondary | ICD-10-CM

## 2020-04-27 LAB — CBC WITH DIFFERENTIAL/PLATELET
Absolute Monocytes: 1045 cells/uL — ABNORMAL HIGH (ref 200–950)
Basophils Absolute: 38 cells/uL (ref 0–200)
Basophils Relative: 0.4 %
Eosinophils Absolute: 352 cells/uL (ref 15–500)
Eosinophils Relative: 3.7 %
HCT: 44.8 % (ref 38.5–50.0)
Hemoglobin: 15 g/dL (ref 13.2–17.1)
Lymphs Abs: 2242 cells/uL (ref 850–3900)
MCH: 29.9 pg (ref 27.0–33.0)
MCHC: 33.5 g/dL (ref 32.0–36.0)
MCV: 89.2 fL (ref 80.0–100.0)
MPV: 10.2 fL (ref 7.5–12.5)
Monocytes Relative: 11 %
Neutro Abs: 5824 cells/uL (ref 1500–7800)
Neutrophils Relative %: 61.3 %
Platelets: 273 10*3/uL (ref 140–400)
RBC: 5.02 10*6/uL (ref 4.20–5.80)
RDW: 12.9 % (ref 11.0–15.0)
Total Lymphocyte: 23.6 %
WBC: 9.5 10*3/uL (ref 3.8–10.8)

## 2020-04-27 LAB — COMPLETE METABOLIC PANEL WITH GFR
AG Ratio: 1.6 (calc) (ref 1.0–2.5)
ALT: 28 U/L (ref 9–46)
AST: 26 U/L (ref 10–35)
Albumin: 4.2 g/dL (ref 3.6–5.1)
Alkaline phosphatase (APISO): 63 U/L (ref 35–144)
BUN/Creatinine Ratio: 14 (calc) (ref 6–22)
BUN: 33 mg/dL — ABNORMAL HIGH (ref 7–25)
CO2: 31 mmol/L (ref 20–32)
Calcium: 9.9 mg/dL (ref 8.6–10.3)
Chloride: 99 mmol/L (ref 98–110)
Creat: 2.4 mg/dL — ABNORMAL HIGH (ref 0.70–1.11)
GFR, Est African American: 28 mL/min/{1.73_m2} — ABNORMAL LOW (ref >=60)
GFR, Est Non African American: 24 mL/min/{1.73_m2} — ABNORMAL LOW (ref >=60)
Globulin: 2.7 g/dL (calc) (ref 1.9–3.7)
Glucose, Bld: 76 mg/dL (ref 65–99)
Potassium: 4.8 mmol/L (ref 3.5–5.3)
Sodium: 139 mmol/L (ref 135–146)
Total Bilirubin: 0.5 mg/dL (ref 0.2–1.2)
Total Protein: 6.9 g/dL (ref 6.1–8.1)

## 2020-04-27 LAB — URINALYSIS, ROUTINE W REFLEX MICROSCOPIC
Bilirubin Urine: NEGATIVE
Glucose, UA: NEGATIVE
Hgb urine dipstick: NEGATIVE
Ketones, ur: NEGATIVE
Leukocytes,Ua: NEGATIVE
Nitrite: NEGATIVE
Protein, ur: NEGATIVE
Specific Gravity, Urine: 1.011 (ref 1.001–1.03)
pH: 8 (ref 5.0–8.0)

## 2020-04-27 LAB — BRAIN NATRIURETIC PEPTIDE: Brain Natriuretic Peptide: 7 pg/mL (ref <100)

## 2020-05-02 ENCOUNTER — Other Ambulatory Visit: Payer: Self-pay

## 2020-05-02 ENCOUNTER — Ambulatory Visit (INDEPENDENT_AMBULATORY_CARE_PROVIDER_SITE_OTHER): Payer: Medicare Other

## 2020-05-02 DIAGNOSIS — E1122 Type 2 diabetes mellitus with diabetic chronic kidney disease: Secondary | ICD-10-CM

## 2020-05-02 DIAGNOSIS — N183 Chronic kidney disease, stage 3 unspecified: Secondary | ICD-10-CM

## 2020-05-02 DIAGNOSIS — Z79899 Other long term (current) drug therapy: Secondary | ICD-10-CM

## 2020-05-02 DIAGNOSIS — N289 Disorder of kidney and ureter, unspecified: Secondary | ICD-10-CM

## 2020-05-02 NOTE — Progress Notes (Signed)
Patient presents to the office for a nurse visit to have labs done. No questions at this time. Vitals taken and recorded.

## 2020-05-03 ENCOUNTER — Other Ambulatory Visit: Payer: Self-pay | Admitting: Adult Health

## 2020-05-03 DIAGNOSIS — E1122 Type 2 diabetes mellitus with diabetic chronic kidney disease: Secondary | ICD-10-CM

## 2020-05-03 DIAGNOSIS — N184 Chronic kidney disease, stage 4 (severe): Secondary | ICD-10-CM

## 2020-05-03 DIAGNOSIS — N289 Disorder of kidney and ureter, unspecified: Secondary | ICD-10-CM

## 2020-05-03 LAB — BASIC METABOLIC PANEL WITH GFR
BUN/Creatinine Ratio: 15 (calc) (ref 6–22)
BUN: 34 mg/dL — ABNORMAL HIGH (ref 7–25)
CO2: 29 mmol/L (ref 20–32)
Calcium: 10 mg/dL (ref 8.6–10.3)
Chloride: 101 mmol/L (ref 98–110)
Creat: 2.26 mg/dL — ABNORMAL HIGH (ref 0.70–1.11)
GFR, Est African American: 30 mL/min/{1.73_m2} — ABNORMAL LOW (ref >=60)
GFR, Est Non African American: 26 mL/min/{1.73_m2} — ABNORMAL LOW (ref >=60)
Glucose, Bld: 79 mg/dL (ref 65–99)
Potassium: 5.1 mmol/L (ref 3.5–5.3)
Sodium: 139 mmol/L (ref 135–146)

## 2020-05-04 ENCOUNTER — Other Ambulatory Visit: Payer: Self-pay

## 2020-05-04 ENCOUNTER — Inpatient Hospital Stay (HOSPITAL_COMMUNITY)
Admission: EM | Admit: 2020-05-04 | Discharge: 2020-05-07 | DRG: 872 | Disposition: A | Discharge status: Home / Self Care | Payer: Medicare Other | Attending: Internal Medicine | Admitting: Internal Medicine

## 2020-05-04 ENCOUNTER — Other Ambulatory Visit: Payer: Self-pay | Admitting: Internal Medicine

## 2020-05-04 ENCOUNTER — Telehealth: Payer: Self-pay

## 2020-05-04 ENCOUNTER — Other Ambulatory Visit: Payer: Self-pay | Admitting: Adult Health

## 2020-05-04 ENCOUNTER — Inpatient Hospital Stay (HOSPITAL_COMMUNITY): Payer: Medicare Other

## 2020-05-04 ENCOUNTER — Emergency Department (HOSPITAL_COMMUNITY): Payer: Medicare Other

## 2020-05-04 ENCOUNTER — Encounter (HOSPITAL_COMMUNITY): Payer: Self-pay | Admitting: Emergency Medicine

## 2020-05-04 DIAGNOSIS — R0902 Hypoxemia: Secondary | ICD-10-CM | POA: Diagnosis present

## 2020-05-04 DIAGNOSIS — B961 Klebsiella pneumoniae [K. pneumoniae] as the cause of diseases classified elsewhere: Secondary | ICD-10-CM | POA: Diagnosis not present

## 2020-05-04 DIAGNOSIS — E1169 Type 2 diabetes mellitus with other specified complication: Secondary | ICD-10-CM | POA: Diagnosis not present

## 2020-05-04 DIAGNOSIS — N179 Acute kidney failure, unspecified: Secondary | ICD-10-CM | POA: Diagnosis not present

## 2020-05-04 DIAGNOSIS — N390 Urinary tract infection, site not specified: Secondary | ICD-10-CM

## 2020-05-04 DIAGNOSIS — A419 Sepsis, unspecified organism: Secondary | ICD-10-CM

## 2020-05-04 DIAGNOSIS — E1122 Type 2 diabetes mellitus with diabetic chronic kidney disease: Secondary | ICD-10-CM | POA: Diagnosis present

## 2020-05-04 DIAGNOSIS — R319 Hematuria, unspecified: Secondary | ICD-10-CM | POA: Diagnosis not present

## 2020-05-04 DIAGNOSIS — Z66 Do not resuscitate: Secondary | ICD-10-CM | POA: Diagnosis not present

## 2020-05-04 DIAGNOSIS — R058 Other specified cough: Secondary | ICD-10-CM

## 2020-05-04 DIAGNOSIS — Z20822 Contact with and (suspected) exposure to covid-19: Secondary | ICD-10-CM | POA: Diagnosis present

## 2020-05-04 DIAGNOSIS — Z7984 Long term (current) use of oral hypoglycemic drugs: Secondary | ICD-10-CM | POA: Diagnosis not present

## 2020-05-04 DIAGNOSIS — E1121 Type 2 diabetes mellitus with diabetic nephropathy: Secondary | ICD-10-CM | POA: Diagnosis not present

## 2020-05-04 DIAGNOSIS — E6609 Other obesity due to excess calories: Secondary | ICD-10-CM | POA: Diagnosis not present

## 2020-05-04 DIAGNOSIS — Z7982 Long term (current) use of aspirin: Secondary | ICD-10-CM | POA: Diagnosis not present

## 2020-05-04 DIAGNOSIS — I10 Essential (primary) hypertension: Secondary | ICD-10-CM | POA: Diagnosis present

## 2020-05-04 DIAGNOSIS — F17290 Nicotine dependence, other tobacco product, uncomplicated: Secondary | ICD-10-CM | POA: Diagnosis present

## 2020-05-04 DIAGNOSIS — N183 Chronic kidney disease, stage 3 unspecified: Secondary | ICD-10-CM | POA: Diagnosis not present

## 2020-05-04 DIAGNOSIS — J9601 Acute respiratory failure with hypoxia: Secondary | ICD-10-CM | POA: Diagnosis not present

## 2020-05-04 DIAGNOSIS — I4510 Unspecified right bundle-branch block: Secondary | ICD-10-CM | POA: Diagnosis not present

## 2020-05-04 DIAGNOSIS — A4159 Other Gram-negative sepsis: Secondary | ICD-10-CM | POA: Diagnosis not present

## 2020-05-04 DIAGNOSIS — R000 Tachycardia, unspecified: Secondary | ICD-10-CM | POA: Diagnosis not present

## 2020-05-04 DIAGNOSIS — R410 Disorientation, unspecified: Secondary | ICD-10-CM | POA: Diagnosis not present

## 2020-05-04 DIAGNOSIS — R509 Fever, unspecified: Secondary | ICD-10-CM | POA: Diagnosis not present

## 2020-05-04 DIAGNOSIS — N184 Chronic kidney disease, stage 4 (severe): Secondary | ICD-10-CM | POA: Diagnosis present

## 2020-05-04 DIAGNOSIS — Z79899 Other long term (current) drug therapy: Secondary | ICD-10-CM | POA: Diagnosis not present

## 2020-05-04 DIAGNOSIS — E785 Hyperlipidemia, unspecified: Secondary | ICD-10-CM | POA: Diagnosis present

## 2020-05-04 DIAGNOSIS — E669 Obesity, unspecified: Secondary | ICD-10-CM

## 2020-05-04 DIAGNOSIS — E1129 Type 2 diabetes mellitus with other diabetic kidney complication: Secondary | ICD-10-CM | POA: Diagnosis present

## 2020-05-04 DIAGNOSIS — N1832 Chronic kidney disease, stage 3b: Secondary | ICD-10-CM | POA: Diagnosis not present

## 2020-05-04 DIAGNOSIS — N400 Enlarged prostate without lower urinary tract symptoms: Secondary | ICD-10-CM | POA: Diagnosis present

## 2020-05-04 DIAGNOSIS — E1165 Type 2 diabetes mellitus with hyperglycemia: Secondary | ICD-10-CM | POA: Diagnosis present

## 2020-05-04 DIAGNOSIS — Z6833 Body mass index (BMI) 33.0-33.9, adult: Secondary | ICD-10-CM | POA: Diagnosis not present

## 2020-05-04 DIAGNOSIS — R05 Cough: Secondary | ICD-10-CM

## 2020-05-04 DIAGNOSIS — I213 ST elevation (STEMI) myocardial infarction of unspecified site: Secondary | ICD-10-CM | POA: Diagnosis not present

## 2020-05-04 HISTORY — DX: Sepsis, unspecified organism: A41.9

## 2020-05-04 HISTORY — DX: Urinary tract infection, site not specified: N39.0

## 2020-05-04 LAB — COMPREHENSIVE METABOLIC PANEL
ALT: 21 U/L (ref 0–44)
AST: 17 U/L (ref 15–41)
Albumin: 3.4 g/dL — ABNORMAL LOW (ref 3.5–5.0)
Alkaline Phosphatase: 68 U/L (ref 38–126)
Anion gap: 11 (ref 5–15)
BUN: 29 mg/dL — ABNORMAL HIGH (ref 8–23)
CO2: 22 mmol/L (ref 22–32)
Calcium: 9.2 mg/dL (ref 8.9–10.3)
Chloride: 102 mmol/L (ref 98–111)
Creatinine, Ser: 2.31 mg/dL — ABNORMAL HIGH (ref 0.61–1.24)
GFR calc Af Amer: 29 mL/min — ABNORMAL LOW (ref >60)
GFR calc non Af Amer: 25 mL/min — ABNORMAL LOW (ref >60)
Glucose, Bld: 159 mg/dL — ABNORMAL HIGH (ref 70–99)
Potassium: 4.5 mmol/L (ref 3.5–5.1)
Sodium: 135 mmol/L (ref 135–145)
Total Bilirubin: 1.2 mg/dL (ref 0.3–1.2)
Total Protein: 6.2 g/dL — ABNORMAL LOW (ref 6.5–8.1)

## 2020-05-04 LAB — PROTIME-INR
INR: 1.2 (ref 0.8–1.2)
Prothrombin Time: 14.8 seconds (ref 11.4–15.2)

## 2020-05-04 LAB — CBC WITH DIFFERENTIAL/PLATELET
Abs Immature Granulocytes: 0 10*3/uL (ref 0.00–0.07)
Basophils Absolute: 0 10*3/uL (ref 0.0–0.1)
Basophils Relative: 0 %
Eosinophils Absolute: 0 10*3/uL (ref 0.0–0.5)
Eosinophils Relative: 0 %
HCT: 42.3 % (ref 39.0–52.0)
Hemoglobin: 13.8 g/dL (ref 13.0–17.0)
Lymphocytes Relative: 3 %
Lymphs Abs: 0.8 10*3/uL (ref 0.7–4.0)
MCH: 29.3 pg (ref 26.0–34.0)
MCHC: 32.6 g/dL (ref 30.0–36.0)
MCV: 89.8 fL (ref 80.0–100.0)
Monocytes Absolute: 0.8 10*3/uL (ref 0.1–1.0)
Monocytes Relative: 3 %
Neutro Abs: 24.1 10*3/uL — ABNORMAL HIGH (ref 1.7–7.7)
Neutrophils Relative %: 94 %
Platelets: 196 10*3/uL (ref 150–400)
RBC: 4.71 MIL/uL (ref 4.22–5.81)
RDW: 12.3 % (ref 11.5–15.5)
WBC: 25.6 10*3/uL — ABNORMAL HIGH (ref 4.0–10.5)
nRBC: 0 % (ref 0.0–0.2)
nRBC: 0 /100 WBC

## 2020-05-04 LAB — CBG MONITORING, ED
Glucose-Capillary: 117 mg/dL — ABNORMAL HIGH (ref 70–99)
Glucose-Capillary: 96 mg/dL (ref 70–99)

## 2020-05-04 LAB — URINALYSIS, ROUTINE W REFLEX MICROSCOPIC
Bilirubin Urine: NEGATIVE
Glucose, UA: NEGATIVE mg/dL
Ketones, ur: NEGATIVE mg/dL
Nitrite: NEGATIVE
Protein, ur: NEGATIVE mg/dL
Specific Gravity, Urine: 1.02 (ref 1.005–1.030)
WBC, UA: >50 WBC/hpf — ABNORMAL HIGH (ref 0–5)
pH: 5 (ref 5.0–8.0)

## 2020-05-04 LAB — PROCALCITONIN: Procalcitonin: 0.27 ng/mL

## 2020-05-04 LAB — APTT: aPTT: 32 seconds (ref 24–36)

## 2020-05-04 LAB — SARS CORONAVIRUS 2 BY RT PCR (HOSPITAL ORDER, PERFORMED IN ~~LOC~~ HOSPITAL LAB): SARS Coronavirus 2: NEGATIVE

## 2020-05-04 LAB — LACTIC ACID, PLASMA: Lactic Acid, Venous: 1.6 mmol/L (ref 0.5–1.9)

## 2020-05-04 MED ORDER — FAMOTIDINE 20 MG PO TABS
20.0000 mg | ORAL_TABLET | Freq: Every day | ORAL | Status: DC
  Administered 2020-05-05 – 2020-05-07 (×3): 20 mg via ORAL
  Filled 2020-05-04 (×3): qty 1

## 2020-05-04 MED ORDER — POLYETHYLENE GLYCOL 3350 17 G PO PACK: 17.0000 g | PACK | Freq: Every day | ORAL | Status: DC | PRN

## 2020-05-04 MED ORDER — SODIUM CHLORIDE 0.9 % IV SOLN
1.0000 g | INTRAVENOUS | Status: DC
  Administered 2020-05-04 – 2020-05-07 (×4): 1 g via INTRAVENOUS
  Filled 2020-05-04 (×4): qty 10

## 2020-05-04 MED ORDER — FLUTICASONE FUROATE-VILANTEROL 100-25 MCG/INH IN AEPB
1.0000 | INHALATION_SPRAY | Freq: Two times a day (BID) | RESPIRATORY_TRACT | Status: DC
  Administered 2020-05-07: 1 via RESPIRATORY_TRACT
  Filled 2020-05-04: qty 28

## 2020-05-04 MED ORDER — LACTATED RINGERS IV BOLUS (SEPSIS)
1000.0000 mL | Freq: Once | INTRAVENOUS | Status: AC
  Administered 2020-05-04: 1000 mL via INTRAVENOUS

## 2020-05-04 MED ORDER — MORPHINE SULFATE (PF) 2 MG/ML IV SOLN: 2.0000 mg | INTRAVENOUS | Status: DC | PRN

## 2020-05-04 MED ORDER — ENOXAPARIN SODIUM 30 MG/0.3ML ~~LOC~~ SOLN
30.0000 mg | SUBCUTANEOUS | Status: DC
  Administered 2020-05-04 – 2020-05-05 (×2): 30 mg via SUBCUTANEOUS
  Filled 2020-05-04 (×2): qty 0.3

## 2020-05-04 MED ORDER — PRAVASTATIN SODIUM 10 MG PO TABS
20.0000 mg | ORAL_TABLET | Freq: Every day | ORAL | Status: DC
  Administered 2020-05-04 – 2020-05-06 (×3): 20 mg via ORAL
  Filled 2020-05-04 (×3): qty 2

## 2020-05-04 MED ORDER — MAGNESIUM OXIDE 400 (241.3 MG) MG PO TABS
400.0000 mg | ORAL_TABLET | Freq: Every day | ORAL | Status: DC
  Administered 2020-05-05 – 2020-05-07 (×3): 400 mg via ORAL
  Filled 2020-05-04 (×5): qty 1

## 2020-05-04 MED ORDER — ASPIRIN EC 81 MG PO TBEC
81.0000 mg | DELAYED_RELEASE_TABLET | Freq: Every day | ORAL | Status: DC
  Administered 2020-05-05 – 2020-05-07 (×3): 81 mg via ORAL
  Filled 2020-05-04 (×3): qty 1

## 2020-05-04 MED ORDER — ACETAMINOPHEN 325 MG PO TABS
650.0000 mg | ORAL_TABLET | Freq: Four times a day (QID) | ORAL | Status: DC | PRN
  Administered 2020-05-04: 650 mg via ORAL
  Filled 2020-05-04: qty 2

## 2020-05-04 MED ORDER — BENZONATATE 100 MG PO CAPS: 100.0000 mg | ORAL_CAPSULE | Freq: Three times a day (TID) | ORAL | Status: DC | PRN

## 2020-05-04 MED ORDER — ACETAMINOPHEN 650 MG RE SUPP: 650.0000 mg | Freq: Four times a day (QID) | RECTAL | Status: DC | PRN

## 2020-05-04 MED ORDER — ONDANSETRON HCL 4 MG PO TABS: 4.0000 mg | ORAL_TABLET | Freq: Four times a day (QID) | ORAL | Status: DC | PRN

## 2020-05-04 MED ORDER — BISACODYL 5 MG PO TBEC: 5.0000 mg | DELAYED_RELEASE_TABLET | Freq: Every day | ORAL | Status: DC | PRN

## 2020-05-04 MED ORDER — ONDANSETRON HCL 4 MG/2ML IJ SOLN: 4.0000 mg | Freq: Four times a day (QID) | INTRAMUSCULAR | Status: DC | PRN

## 2020-05-04 MED ORDER — PREDNISONE 20 MG PO TABS
20.0000 mg | ORAL_TABLET | Freq: Every day | ORAL | Status: DC
  Administered 2020-05-05 – 2020-05-07 (×3): 20 mg via ORAL
  Filled 2020-05-04 (×3): qty 1

## 2020-05-04 MED ORDER — INSULIN ASPART 100 UNIT/ML ~~LOC~~ SOLN
0.0000 [IU] | Freq: Three times a day (TID) | SUBCUTANEOUS | Status: DC
  Administered 2020-05-05 (×2): 5 [IU] via SUBCUTANEOUS
  Administered 2020-05-05: 8 [IU] via SUBCUTANEOUS
  Administered 2020-05-06 (×2): 2 [IU] via SUBCUTANEOUS
  Administered 2020-05-06: 5 [IU] via SUBCUTANEOUS
  Administered 2020-05-07: 3 [IU] via SUBCUTANEOUS

## 2020-05-04 MED ORDER — ACETAMINOPHEN 650 MG RE SUPP: 650.0000 mg | RECTAL | Status: DC | PRN

## 2020-05-04 MED ORDER — INSULIN ASPART 100 UNIT/ML ~~LOC~~ SOLN
0.0000 [IU] | Freq: Every day | SUBCUTANEOUS | Status: DC
  Administered 2020-05-05 – 2020-05-06 (×2): 2 [IU] via SUBCUTANEOUS

## 2020-05-04 MED ORDER — SODIUM CHLORIDE 0.9% FLUSH
3.0000 mL | Freq: Two times a day (BID) | INTRAVENOUS | Status: DC
  Administered 2020-05-05 – 2020-05-07 (×4): 3 mL via INTRAVENOUS

## 2020-05-04 MED ORDER — DOCUSATE SODIUM 100 MG PO CAPS
100.0000 mg | ORAL_CAPSULE | Freq: Two times a day (BID) | ORAL | Status: DC
  Administered 2020-05-04 – 2020-05-06 (×3): 100 mg via ORAL
  Filled 2020-05-04 (×6): qty 1

## 2020-05-04 MED ORDER — TRAMADOL HCL 50 MG PO TABS: 50.0000 mg | ORAL_TABLET | Freq: Four times a day (QID) | ORAL | Status: DC | PRN

## 2020-05-04 MED ORDER — LACTATED RINGERS IV SOLN: INTRAVENOUS | Status: DC

## 2020-05-04 MED ORDER — DOXAZOSIN MESYLATE 2 MG PO TABS
4.0000 mg | ORAL_TABLET | Freq: Every day | ORAL | Status: DC
  Administered 2020-05-05 – 2020-05-06 (×3): 4 mg via ORAL
  Filled 2020-05-04: qty 1
  Filled 2020-05-04 (×3): qty 2

## 2020-05-04 MED ORDER — ACETAMINOPHEN 325 MG PO TABS
650.0000 mg | ORAL_TABLET | ORAL | Status: DC | PRN
  Administered 2020-05-04 – 2020-05-06 (×2): 650 mg via ORAL
  Filled 2020-05-04 (×2): qty 2

## 2020-05-04 NOTE — ED Triage Notes (Signed)
Pt BIB [facility] from home. Pt complaint of fever and pain while urinating. Pt presents tachycardic @ 126, fever of 102.9, SpO2 95% 4 L [place]. Pt A&Ox4. NAD.

## 2020-05-04 NOTE — ED Notes (Signed)
Pt shivering and  Complaining of being cold.  REctal temp repeated and was 100.4

## 2020-05-04 NOTE — H&P (Signed)
History and Physical    DAMASCUS FELDPAUSCH YTK:[id number] DOB: 11-16-1934 DOA: 05/04/2020  PCP: Unk Pinto, MD Consultants:  None Patient coming from:  Home - lives with wife; NOK: Wife, [phone]  Chief Complaint: Fever, dysuria  HPI: Edwin Osborne is a 85 y.o. male with medical history significant of HTN; HLD; DM; and stage 3 CKD presenting with fever, dysuria.  He came in because his children and wife told him to come.  He started with a fever overnight.  Fever was over 102.  +urinary symptoms, barely voiding.  He ok yesterday but much worse today.  He fell about 2 weeks ago and hit his ribs.  He got bronchitis after that and never really recovered.  He did seem to be doing better from that standpoint.  They had to stand him to hold him up for activities.  +dysuria.  +R flank pain - started hurting when he fell but then improved and then started hurting again.  Cough persists and still coughing up sputum.  He has baseline urinary urgency with no warning and incontinence if he does not go immediately.  Small, frequent voids.  He was seen in the ER on 7/30 following his fall and was recommended for admission and for SNF by PT.  He was noted to have a rib contusion and required [place] O2 which was thought to be due to splinting and poor inspiratory effort.  He left AMA.  He followed up with his PCP on 8/5 and was reporting exertional SOB; while family was concerned about CHF, the NP who saw him thought it was likely to be acute bronchitis and started [place].  He was also noted to have recently completed Z-pack.    ED Course:  Fever, generalized weakness, UTI.  WBC 26k.  Cultures pending.  Given Rocephin.  BP soft but ok.  Lactate ok.  Review of Systems: As per HPI; otherwise review of systems reviewed and negative.   Ambulatory Status:  Ambulates without assistance  COVID Vaccine Status:  Complete  Past Medical History:  Diagnosis Date  . Arthritis   . BPH (benign  prostatic hyperplasia)   . Cataract   . CKD (chronic kidney disease), stage III   . Depression    "usually a happy go lucky"  . Diabetes mellitus   . Diverticulosis   . GERD (gastroesophageal reflux disease)    sometimes will take omeprazole  . Hyperlipidemia   . Hypertension     Past Surgical History:  Procedure Laterality Date  . CATARACT EXTRACTION, BILATERAL Bilateral 2020   Dr. Einar Gip  . CHOLECYSTECTOMY N/A 11/29/2015   Procedure: LAPAROSCOPIC CHOLECYSTECTOMY WITH INTRAOPERATIVE CHOLANGIOGRAM;  Surgeon: Erroll Luna, MD;  Location: [facility] OR;  Service: General;  Laterality: N/A;  . COLONOSCOPY  2005, 08/21/11   2005 tubulovillous adenoma and adenomas 2012 - 3 adenomas, largest 10 mm, diverticulosis  . FRACTURE SURGERY     ? right foot as a child  . KNEE ARTHROSCOPY    . TONSILLECTOMY AND ADENOIDECTOMY    . VEIN LIGATION AND STRIPPING     left    Social History   Socioeconomic History  . Marital status: Married    Spouse name: Not on file  . Number of children: 3  . Years of education: Not on file  . Highest education level: Not on file  Occupational History  . Occupation: retired  Tobacco Use  . Smoking status: Current Some Day Smoker    Types: Cigars  Last attempt to quit: 09/23/1987    Years since quitting: 32.6  . Smokeless tobacco: Never Used  . Tobacco comment: once monthly  Substance and Sexual Activity  . Alcohol use: Yes    Comment: once monthly  . Drug use: No  . Sexual activity: Not on file  Other Topics Concern  . Not on file  Social History Narrative  . Not on file   Social Determinants of Health   Financial Resource Strain:   . Difficulty of Paying Living Expenses:   Food Insecurity:   . Worried About Charity fundraiser in the Last Year:   . Arboriculturist in the Last Year:   Transportation Needs:   . Film/video editor (Medical):   Marland Kitchen Lack of Transportation (Non-Medical):   Physical Activity:   . Days of Exercise per Week:   .  Minutes of Exercise per Session:   Stress:   . Feeling of Stress :   Social Connections:   . Frequency of Communication with Friends and Family:   . Frequency of Social Gatherings with Friends and Family:   . Attends Religious Services:   . Active Member of Clubs or Organizations:   . Attends Archivist Meetings:   Marland Kitchen Marital Status:   Intimate Partner Violence:   . Fear of Current or Ex-Partner:   . Emotionally Abused:   Marland Kitchen Physically Abused:   . Sexually Abused:     No Known Allergies  Family History  Problem Relation Age of Onset  . Prostate cancer Brother   . Diabetes Brother   . Colon cancer Neg Hx     Prior to Admission medications   Medication Sig Start Date End Date Taking? Authorizing Provider  aspirin EC 81 MG tablet Take 81 mg by mouth daily.    [provider]  benzonatate (TESSALON) 200 MG capsule Take 1 tab three times a day as needed for cough. 04/26/20   Liane Comber, NP  Blood Glucose Monitoring Suppl (ONE TOUCH ULTRA 2) w/Device KIT check blood sugar 1 time daily-E11.21 11/25/19   Unk Pinto, MD  Budeson-Glycopyrrol-Formoterol (BREZTRI AEROSPHERE) 160-9-4.8 MCG/ACT AERO Inhale 1 puff into the lungs 2 (two) times daily. 04/26/20   Liane Comber, NP  Cinnamon 500 MG TABS Take 1,000 mg by mouth.    [provider]  doxazosin (CARDURA) 8 MG tablet Take 1 tablet at Bedtime for BP & Prostate Patient taking differently: Take 4 mg by mouth See admin instructions. Take 1/2 tablet at Bedtime for BP & Prostate 01/05/20   Unk Pinto, MD  famotidine (PEPCID) 40 MG tablet Take 1 tablet Daily for Indigestion & Heartburn 08/08/19   Unk Pinto, MD  glimepiride (AMARYL) 4 MG tablet TAKE 1 TABLET BY MOUTH WITH BREAKFAST. IF FASTING GLUCOSE REMAINS ABOVE 150 IN THE MORNING TAKE ONE-HALF TABLET WITH DINNER. ONLY TAKE IF EATING. Patient taking differently: Take 4 mg by mouth See admin instructions. TAKE 1 TABLET BY MOUTH WITH BREAKFAST. IF  FASTING GLUCOSE REMAINS ABOVE 150 IN THE MORNING TAKE ONE-HALF TABLET WITH DINNER. ONLY TAKE IF EATING. 03/09/20   Liane Comber, NP  glucose blood (ONETOUCH ULTRA) test strip Check blood sugasr 1 time daily-E11.21 11/25/19   Unk Pinto, MD  Lancets [facility] ULTRASOFT) lancets Check blood sugar 1 time daily-E11.21 11/25/19   Unk Pinto, MD  lisinopril (ZESTRIL) 40 MG tablet Take 1 tablet Daily for BP & Diabetic Kidney Protection Patient taking differently: Take 40 mg by mouth daily. for BP &  Diabetic Kidney Protection 11/30/19   Unk Pinto, MD  magnesium oxide (MAG-OX) 400 MG tablet Take 400 mg by mouth daily.    [provider]  Multiple Vitamins-Minerals (MULTIVITAMIN WITH MINERALS) tablet Take 1 tablet by mouth daily.      [provider]  pravastatin (PRAVACHOL) 40 MG tablet TAKE 1 TABLET BY MOUTH AT BEDTIME FOR CHOLESTEROL Patient taking differently: Take 20 mg by mouth daily. BEDTIME FOR CHOLESTEROL 08/24/19   Unk Pinto, MD  traMADol (ULTRAM) 50 MG tablet Take 1 tablet (50 mg total) by mouth every 6 (six) hours as needed. Patient taking differently: Take 50 mg by mouth every 6 (six) hours as needed for moderate pain.  04/19/20 04/19/21  Garnet Sierras, NP  VITAMIN D PO Take 5,000 Units by mouth 2 (two) times daily.    [provider]    Physical Exam: Vitals:   05/04/20 1505 05/04/20 1515 05/04/20 1548 05/04/20 1646  BP:  109/62 134/76   Pulse:  (!) 108 (!) 118 (!) 116  Resp:  20 (!) 21 16  Temp: (!) 100.7 F (38.2 C)  (!) 100.4 F (38 C)   TempSrc: Rectal  Rectal   SpO2:  93% 94% 92%  Weight:      Height:         . General:  Appears acutely ill - wrapped up like a mummy in blankets due to fever, rigors . Eyes:  PERRL, EOMI, normal lids, iris . ENT:  grossly normal hearing, lips & tongue, mmm . Neck:  no LAD, masses or thyromegaly . Cardiovascular:  RR with mild tachycardia, no m/r/g. Trace LE edema.  Marland Kitchen Respiratory:   CTA  bilaterally with no wheezes/rales/rhonchi.  Normal respiratory effort. . Abdomen:  soft, NT, ND, NABS . Back:   normal alignment, R CVAT - although he thinks this is due to injury from prior fall rather than CVA pain . Skin:  no rash or induration seen on limited exam . Musculoskeletal:  grossly normal tone BUE/BLE, good ROM, no bony abnormality . Psychiatric:  blunted mood and affect, speech fluent and appropriate, AOx3 . Neurologic:  CN 2-12 grossly intact, moves all extremities in coordinated fashion    Radiological Exams on Admission: DG Chest Port 1 View  Result Date: 05/04/2020 CLINICAL DATA:  Fever since yesterday. Hematuria. History of hypertension. EXAM: PORTABLE CHEST 1 VIEW COMPARISON:  04/19/2020. FINDINGS: Cardiac silhouette is normal in size. No mediastinal or hilar masses. Mild linear opacities at the left lung base consistent with scarring and/or atelectasis. Lungs otherwise clear. No pleural effusion or pneumothorax. Skeletal structures are demineralized but grossly intact. IMPRESSION: No active disease. Electronically Signed   By: [name] M.D.   On: 05/04/2020 14:38    EKG: Independently reviewed.  Junctional tachycardia with rate 115; RBBB, LPFB; no evidence of acute ischemia   Labs on Admission: I have personally reviewed the available labs and imaging studies at the time of the admission.  Pertinent labs:   Glucose 159 BUN 29/Creatinine 2.31/GFR 25 - stable Lactate 1.6 WBC 25.6; 9.5 on 8/5 BNP 7 on 8/5 UA: small Hgb, moderate LE, many bacteria, >50 WBC; UA WNL on 8/5   Assessment/Plan Principal Problem:   Sepsis secondary to UTI [facility]) Active Problems:   Essential hypertension   Type 2 diabetes mellitus with stage 3 chronic kidney disease, without long-term current use of insulin ([place])   Hyperlipidemia associated with type 2 diabetes mellitus ([place])   Stage 3 chronic kidney disease due to  type 2 diabetes mellitus (HCC)   Class 1 obesity due to excess  calories with body mass index (BMI) of 33.0 to 33.9 in adult    Sepsis due to UTI -SIRS criteria in this patient includes: Leukocytosis, fever, tachycardia, tachypnea  -Patient has no evidence of acute organ failure but borderline BP -While awaiting blood cultures, this may be a preseptic condition. -Sepsis protocol initiated -Suspected source is UTI; however, will also order stat renal US to r/o obstructing stone. -Blood and urine cultures pending -Will admit due to: marginal hemodynamic instability; hypoxia -Treat with IV Rocephin for presumed urinary source -Will order sepsis protocol procalcitonin level.   >0.5 indicates infection and >>0.5 indicates more serious disease.  As the procalcitonin level normalizes, it will be reasonable to consider de-escalation of antibiotic coverage. -Has baseline BPH and so it is somewhat difficult to tease out BPH vs. Acute UTI symptoms - but he does appear to have new dysuria  Hypoxia -Patient was recently noted to have hypoxia on recent ER/PCP visit -At the time it was thought to be due to splinting and then to bronchitis -He has persistent, productive cough -CXR is negative -He remains mildly hypoxic -Perhaps COPD component -Will add steroids -Will do ambulatory pulse ox (walk of life) in AM -Continue Breztri and Tessalon for now -Will check 2-view CXR in AM  HTN -Continue Cardura -Hold Lisinopril  HLD -Continue pravachol  DM -Poorly controlled based on last A1c -hold Amaryl -Cover with moderate-scale SSI  Stage 4 CKD -Appears to be mildly progressed and now stage 4 -Hold Lisinopril -Gentle IV hydration -Recheck BMP in AM  Obesity Body mass index is 33.29 kg/m.  -Weight loss should be encouraged -Outpatient PCP/bariatric medicine/bariatric surgery f/u encouraged    Note: This patient has been tested and is negative for the novel coronavirus COVID-19.  DVT prophylaxis:  Lovenox Code Status:  DNR - confirmed with  patient/family Family Communication: Son present throughout evaluation  Disposition Plan:  The patient is from: home  Anticipated d/c is to: home without [facility] services  Anticipated d/c date will depend on clinical response to treatment, likely 2-3 days  Patient is currently: acutely ill Consults called: None  Admission status: Admit - It is my clinical opinion that admission to [place] is reasonable and necessary because of the expectation that this patient will require hospital care that crosses at least 2 midnights to treat this condition based on the medical complexity of the problems presented.  Given the aforementioned information, the predictability of an adverse outcome is felt to be significant.   Karmen Bongo MD [facility]   How to contact the [facility] Attending or Consulting provider [place] or covering provider during after hours [place], for this patient?  1. Check the care team in [facility] and look for a) attending/consulting [facility] provider listed and b) the [facility] team listed 2. Log into [email or web address] and use [place]'s universal password to access. If you do not have the password, please contact the hospital operator. 3. Locate the [facility] provider you are looking for under [facility] and page to a number that you can be directly reached. 4. If you still have difficulty reaching the provider, please page the [facility] (Director on Call) for the Hospitalists listed on amion for assistance.   05/04/2020, 4:51 PM

## 2020-05-04 NOTE — ED Provider Notes (Addendum)
[facility] EMERGENCY DEPARTMENT Provider Note   CSN: [id number] Arrival date & time: 05/04/20  1404     History Chief Complaint  Patient presents with   Fever   Urinary Tract Infection    Edwin Osborne is a 85 y.o. male with past medical history significant for BPH,  CKD stage 3, diabetes, GERD, hyperlipidemia, hypertension. Patient did receive Covid vaccinations.  HPI Patient presents to emergency department today via EMS with chief complaint of fever and dysuria x1 day.  Patient states his wife was concerned when his fever was high.  Patient does not remember exactly what it was. He endorses 2 episodes of dysuria prior to arrival.  He received 500 mL normal saline in route with EMS.  No medications given for symptoms prior to arrival.  She was found to have a temperature of 102.2 and heart rate of 126.  He was also hypoxic to 88% on room air.  He is placed on 2 L.  Patient typically does not wear oxygen. They also reported patient to be confused.  He denies any chills, cough, chest pain, shortness of breath, abdominal pain, nausea, emesis, back pain, testicular or scrotal pain, hematuria, penile discharge.  States he had a fall x3 weeks ago.  He soon after developed bronchitis.  Ever since the fall he has had had generalized weakness and difficulty getting around.  He does also endorse weight loss in the last 2 months, estimating 20 to 30 pounds.  Patient states he has made drastic dietary choices to help better control his diabetes.    Past Medical History:  Diagnosis Date   Arthritis    BPH (benign prostatic hyperplasia)    Cataract    CKD (chronic kidney disease), stage III    Depression    "usually a happy go lucky"   Diabetes mellitus    Diverticulosis    GERD (gastroesophageal reflux disease)    sometimes will take omeprazole   Hyperlipidemia    Hypertension     Patient Active Problem List   Diagnosis Date Noted   Deterioration  in renal function 04/27/2020   Rib pain 04/20/2020   AKI (acute kidney injury) ([place]) 04/20/2020   Thoracic aortic atherosclerosis ([place]) 09/26/2019   Bilateral chronic knee pain 08/15/2019   Class 2 severe obesity due to excess calories with serious comorbidity and body mass index (BMI) of 36.0 to 36.9 in adult [facility]) 11/02/2018   Gluttony 03/16/2017   Diabetic sensory polyneuropathy ([place]) 01/18/2016   Stage 3 chronic kidney disease due to type 2 diabetes mellitus ([place]) 03/07/2014   Medication management 03/07/2014   Non compliance with medical treatment 03/07/2014   Hyperlipidemia associated with type 2 diabetes mellitus ([place]) 08/15/2013   Vitamin D deficiency 08/15/2013   Type 2 diabetes mellitus with stage 3 chronic kidney disease, without long-term current use of insulin ([place]) 07/28/2013   Essential hypertension    History of colonic polyps 04/09/2004    Past Surgical History:  Procedure Laterality Date   CATARACT EXTRACTION, BILATERAL Bilateral 2020   Dr. Einar Gip   CHOLECYSTECTOMY N/A 11/29/2015   Procedure: LAPAROSCOPIC CHOLECYSTECTOMY WITH INTRAOPERATIVE CHOLANGIOGRAM;  Surgeon: Erroll Luna, MD;  Location: [facility] OR;  Service: General;  Laterality: N/A;   COLONOSCOPY  2005, 08/21/11   2005 tubulovillous adenoma and adenomas 2012 - 3 adenomas, largest 10 mm, diverticulosis   FRACTURE SURGERY     ? right foot as a child   KNEE ARTHROSCOPY     TONSILLECTOMY AND ADENOIDECTOMY  VEIN LIGATION AND STRIPPING     left       Family History  Problem Relation Age of Onset   Prostate cancer Brother    Diabetes Brother    Colon cancer Neg Hx     Social History   Tobacco Use   Smoking status: Current Some Day Smoker    Types: Cigars    Last attempt to quit: 09/23/1987    Years since quitting: 32.6   Smokeless tobacco: Never Used   Tobacco comment: once monthly  Substance Use Topics   Alcohol use: Yes    Comment: once monthly   Drug use: No     Home Medications Prior to Admission medications   Medication Sig Start Date End Date Taking? Authorizing Provider  aspirin EC 81 MG tablet Take 81 mg by mouth daily.    [provider]  benzonatate (TESSALON) 200 MG capsule Take 1 tab three times a day as needed for cough. 04/26/20   Liane Comber, NP  Blood Glucose Monitoring Suppl (ONE TOUCH ULTRA 2) w/Device KIT check blood sugar 1 time daily-E11.21 11/25/19   Unk Pinto, MD  Budeson-Glycopyrrol-Formoterol (BREZTRI AEROSPHERE) 160-9-4.8 MCG/ACT AERO Inhale 1 puff into the lungs 2 (two) times daily. 04/26/20   Liane Comber, NP  Cinnamon 500 MG TABS Take 1,000 mg by mouth.    [provider]  doxazosin (CARDURA) 8 MG tablet Take 1 tablet at Bedtime for BP & Prostate Patient taking differently: Take 4 mg by mouth See admin instructions. Take 1/2 tablet at Bedtime for BP & Prostate 01/05/20   Unk Pinto, MD  famotidine (PEPCID) 40 MG tablet Take 1 tablet Daily for Indigestion & Heartburn 08/08/19   Unk Pinto, MD  glimepiride (AMARYL) 4 MG tablet TAKE 1 TABLET BY MOUTH WITH BREAKFAST. IF FASTING GLUCOSE REMAINS ABOVE 150 IN THE MORNING TAKE ONE-HALF TABLET WITH DINNER. ONLY TAKE IF EATING. Patient taking differently: Take 4 mg by mouth See admin instructions. TAKE 1 TABLET BY MOUTH WITH BREAKFAST. IF FASTING GLUCOSE REMAINS ABOVE 150 IN THE MORNING TAKE ONE-HALF TABLET WITH DINNER. ONLY TAKE IF EATING. 03/09/20   Liane Comber, NP  glucose blood (ONETOUCH ULTRA) test strip Check blood sugasr 1 time daily-E11.21 11/25/19   Unk Pinto, MD  Lancets [facility] ULTRASOFT) lancets Check blood sugar 1 time daily-E11.21 11/25/19   Unk Pinto, MD  lisinopril (ZESTRIL) 40 MG tablet Take 1 tablet Daily for BP & Diabetic Kidney Protection Patient taking differently: Take 40 mg by mouth daily. for BP & Diabetic Kidney Protection 11/30/19   Unk Pinto, MD  magnesium oxide (MAG-OX) 400 MG tablet Take 400 mg by  mouth daily.    [provider]  Multiple Vitamins-Minerals (MULTIVITAMIN WITH MINERALS) tablet Take 1 tablet by mouth daily.      [provider]  pravastatin (PRAVACHOL) 40 MG tablet TAKE 1 TABLET BY MOUTH AT BEDTIME FOR CHOLESTEROL Patient taking differently: Take 20 mg by mouth daily. BEDTIME FOR CHOLESTEROL 08/24/19   Unk Pinto, MD  traMADol (ULTRAM) 50 MG tablet Take 1 tablet (50 mg total) by mouth every 6 (six) hours as needed. Patient taking differently: Take 50 mg by mouth every 6 (six) hours as needed for moderate pain.  04/19/20 04/19/21  Garnet Sierras, NP  VITAMIN D PO Take 5,000 Units by mouth 2 (two) times daily.    [provider]    Allergies    Patient has no known allergies.  Review of Systems   Review of Systems All  other systems are reviewed and are negative for acute change except as noted in the HPI.  Physical Exam Updated Vital Signs BP 116/69 (BP Location: Right Arm)    Pulse (!) 113    Temp 99.2 F (37.3 C) (Oral)    Resp 16    SpO2 91%   Physical Exam Vitals and nursing note reviewed.  Constitutional:      General: He is not in acute distress.    Appearance: He is obese. He is not ill-appearing.     Comments: Patient feels warm  HENT:     Head: Normocephalic and atraumatic.     Right Ear: Tympanic membrane and external ear normal.     Left Ear: Tympanic membrane and external ear normal.     Nose: Nose normal.     Mouth/Throat:     Mouth: Mucous membranes are moist.     Pharynx: Oropharynx is clear.  Eyes:     General: No scleral icterus.       Right eye: No discharge.        Left eye: No discharge.     Extraocular Movements: Extraocular movements intact.     Conjunctiva/sclera: Conjunctivae normal.     Pupils: Pupils are equal, round, and reactive to light.  Neck:     Vascular: No JVD.  Cardiovascular:     Rate and Rhythm: Regular rhythm. Tachycardia present.     Pulses: Normal pulses.          Radial pulses  are 2+ on the right side and 2+ on the left side.     Heart sounds: Normal heart sounds.  Pulmonary:     Comments: Lungs clear to auscultation in all fields. Symmetric chest rise. No wheezing, rales, or rhonchi. Oxygen saturation 99% on 2L Abdominal:     Tenderness: There is no right CVA tenderness or left CVA tenderness.     Comments: Abdomen is soft, non-distended, and non-tender in all quadrants. No rigidity, no guarding. No peritoneal signs.  Musculoskeletal:        General: Normal range of motion.     Cervical back: Normal range of motion.  Skin:    General: Skin is warm and dry.     Capillary Refill: Capillary refill takes less than 2 seconds.  Neurological:     Mental Status: He is oriented to person, place, and time.     GCS: GCS eye subscore is 4. GCS verbal subscore is 5. GCS motor subscore is 6.     Comments: Fluent speech, no facial droop.  Psychiatric:        Behavior: Behavior normal.     ED Results / Procedures / Treatments   Labs (all labs ordered are listed, but only abnormal results are displayed) Labs Reviewed  CBC WITH DIFFERENTIAL/PLATELET - Abnormal; Notable for the following components:      Result Value   WBC 25.6 (*)    All other components within normal limits  URINALYSIS, ROUTINE W REFLEX MICROSCOPIC - Abnormal; Notable for the following components:   APPearance HAZY (*)    Hgb urine dipstick SMALL (*)    Leukocytes,Ua MODERATE (*)    WBC, UA >50 (*)    Bacteria, UA MANY (*)    All other components within normal limits  CULTURE, BLOOD (ROUTINE X 2)  CULTURE, BLOOD (ROUTINE X 2)  URINE CULTURE  SARS CORONAVIRUS 2 BY RT PCR (HOSPITAL ORDER, [facility])  LACTIC ACID, PLASMA  PROTIME-INR  APTT  LACTIC ACID, PLASMA  COMPREHENSIVE METABOLIC PANEL    EKG EKG Interpretation  Date/Time:  Friday May 04 2020 14:43:02 EDT Ventricular Rate:  115 PR Interval:    QRS Duration: 166 QT Interval:  398 QTC  Calculation: 551 R Axis:   96 Text Interpretation: Junctional tachycardia RBBB and LPFB No significant change was found Confirmed by Gerlene Fee [phone]) on 05/04/2020 3:00:00 PM   Radiology DG Chest Port 1 View  Result Date: 05/04/2020 CLINICAL DATA:  Fever since yesterday. Hematuria. History of hypertension. EXAM: PORTABLE CHEST 1 VIEW COMPARISON:  04/19/2020. FINDINGS: Cardiac silhouette is normal in size. No mediastinal or hilar masses. Mild linear opacities at the left lung base consistent with scarring and/or atelectasis. Lungs otherwise clear. No pleural effusion or pneumothorax. Skeletal structures are demineralized but grossly intact. IMPRESSION: No active disease. Electronically Signed   By: [name] M.D.   On: 05/04/2020 14:38    Procedures .Critical Care Performed by: Cherre Robins, PA-C Authorized by: Cherre Robins, PA-C   Critical care provider statement:    Critical care time (minutes):  33   Critical care time was exclusive of:  Separately billable procedures and treating other patients and teaching time   Critical care was necessary to treat or prevent imminent or life-threatening deterioration of the following conditions:  Sepsis   Critical care was time spent personally by me on the following activities:  Development of treatment plan with patient or surrogate, evaluation of patient's response to treatment, obtaining history from patient or surrogate, ordering and performing treatments and interventions, ordering and review of laboratory studies, ordering and review of radiographic studies, pulse oximetry and re-evaluation of patient's condition   I assumed direction of critical care for this patient from another provider in my specialty: no     (including critical care time)  Medications Ordered in ED Medications  lactated ringers infusion (has no administration in time range)  cefTRIAXone (ROCEPHIN) 1 g in sodium chloride 0.9 % 100 mL IVPB (1 g  Intravenous New Bag/Given 05/04/20 1511)  lactated ringers bolus 1,000 mL (has no administration in time range)    And  lactated ringers bolus 1,000 mL (1,000 mLs Intravenous New Bag/Given 05/04/20 1508)    And  lactated ringers bolus 1,000 mL (has no administration in time range)    ED Course  I have reviewed the triage vital signs and the nursing notes.  Pertinent labs & imaging results that were available during my care of the patient were reviewed by me and considered in my medical decision making (see chart for details).  Clinical Course as of May 05 1515  Fri May 04, 2020  1448 Attempted to call spouse without answer   [KA]  1512 RBC: 4.71 [KA]    Clinical Course User Index [KA] Saagar Tortorella, Harley Hallmark, PA-C   MDM Rules/Calculators/A&P                          History provided by patient and EMS with additional history obtained from chart review.    85 year old male presenting with fever and dysuria.  Vitals from EMS concerning for sepsis.  Code sepsis initiated on arrival with possible source of UTI.  Rocephin and sepsis fluids ordered.  On exam patient is well appearing, he is alert and oriented  He feels warm to the touch. Temperature 99.2 orally, rectal 100.7. His lungs are clear to auscultation in all fields.  He has no abdominal  tenderness.  No CVA tenderness.  Oxygen saturation on room air is 95%.  Will discontinue oxygen applied by EMS.  I viewed pt's chest xray and it does not suggest acute infectious processes. EKG without ischemic changes. UA is positive for UTI.  CBC significant for leukocytosis of 25.6.  Lactic acid within normal range.  Remaining labs are pending.  Will sign out to oncoming provider Dr. Ashok Cordia pending labs. Anticipate admission.  Portions of this note were generated with [employer]. Dictation errors may occur despite best attempts at proofreading.   Final Clinical Impression(s) / ED Diagnoses Final diagnoses:  Sepsis, due to  unspecified organism, unspecified whether acute organ dysfunction present [facility])    Rx / DC Orders ED Discharge Orders    None       Cherre Robins, PA-C 05/04/20 1516    Cherre Robins, PA-C 05/04/20 1628    Maudie Flakes, MD 05/07/20 1536

## 2020-05-04 NOTE — Progress Notes (Signed)
New Admission Note: 05/04/2020  Arrival Method: Patient arrived from ED to room 5M09 via [place] and oriented x 4 Telemetry: On box 5M09 showing junctional tachycardia, 2nd verification completed Assessment: completed Skin: 2 RNs checked. No issues  IV: RFA and Left Hand with LR infusing @ 158mL/hr Pain: See pain assessment Safety Measures: Bed in low position, bed alarm on.  Admission: Completed [facility] [place] Orientation: Patient has been orientated to the room, unit and staff.   VS-98.4-124-20-126/64. 02 sat. Is 95% on 2L [place]. Patient is in yellow MEWS. Temperature has improved from earlier reading. Orders have been reviewed and implemented. Will continue to monitor the patient.

## 2020-05-04 NOTE — Telephone Encounter (Signed)
Thinks he could have a UTI. Voided a lot through the night. After she got off the phone with you this morning, he voided and is complaining of burning. Has a fever of 101.5. Requesting a stronger antibiotic.

## 2020-05-05 ENCOUNTER — Inpatient Hospital Stay (HOSPITAL_COMMUNITY): Payer: Medicare Other

## 2020-05-05 DIAGNOSIS — N183 Chronic kidney disease, stage 3 unspecified: Secondary | ICD-10-CM

## 2020-05-05 DIAGNOSIS — E1121 Type 2 diabetes mellitus with diabetic nephropathy: Secondary | ICD-10-CM

## 2020-05-05 DIAGNOSIS — E1122 Type 2 diabetes mellitus with diabetic chronic kidney disease: Secondary | ICD-10-CM

## 2020-05-05 DIAGNOSIS — Z6833 Body mass index (BMI) 33.0-33.9, adult: Secondary | ICD-10-CM

## 2020-05-05 DIAGNOSIS — E785 Hyperlipidemia, unspecified: Secondary | ICD-10-CM

## 2020-05-05 DIAGNOSIS — E6609 Other obesity due to excess calories: Secondary | ICD-10-CM

## 2020-05-05 DIAGNOSIS — I10 Essential (primary) hypertension: Secondary | ICD-10-CM

## 2020-05-05 DIAGNOSIS — N1832 Chronic kidney disease, stage 3b: Secondary | ICD-10-CM

## 2020-05-05 DIAGNOSIS — E1169 Type 2 diabetes mellitus with other specified complication: Secondary | ICD-10-CM

## 2020-05-05 LAB — BASIC METABOLIC PANEL
Anion gap: 9 (ref 5–15)
BUN: 21 mg/dL (ref 8–23)
CO2: 26 mmol/L (ref 22–32)
Calcium: 8.7 mg/dL — ABNORMAL LOW (ref 8.9–10.3)
Chloride: 103 mmol/L (ref 98–111)
Creatinine, Ser: 2.21 mg/dL — ABNORMAL HIGH (ref 0.61–1.24)
GFR calc Af Amer: 31 mL/min — ABNORMAL LOW (ref >60)
GFR calc non Af Amer: 27 mL/min — ABNORMAL LOW (ref >60)
Glucose, Bld: 119 mg/dL — ABNORMAL HIGH (ref 70–99)
Potassium: 4.9 mmol/L (ref 3.5–5.1)
Sodium: 138 mmol/L (ref 135–145)

## 2020-05-05 LAB — GLUCOSE, CAPILLARY
Glucose-Capillary: 216 mg/dL — ABNORMAL HIGH (ref 70–99)
Glucose-Capillary: 230 mg/dL — ABNORMAL HIGH (ref 70–99)
Glucose-Capillary: 256 mg/dL — ABNORMAL HIGH (ref 70–99)
Glucose-Capillary: 202 mg/dL — ABNORMAL HIGH (ref 70–99)

## 2020-05-05 LAB — CBC
HCT: 38.7 % — ABNORMAL LOW (ref 39.0–52.0)
Hemoglobin: 12.5 g/dL — ABNORMAL LOW (ref 13.0–17.0)
MCH: 29.1 pg (ref 26.0–34.0)
MCHC: 32.3 g/dL (ref 30.0–36.0)
MCV: 90 fL (ref 80.0–100.0)
Platelets: 181 10*3/uL (ref 150–400)
RBC: 4.3 MIL/uL (ref 4.22–5.81)
RDW: 12.5 % (ref 11.5–15.5)
WBC: 23.9 10*3/uL — ABNORMAL HIGH (ref 4.0–10.5)
nRBC: 0 % (ref 0.0–0.2)

## 2020-05-05 NOTE — Progress Notes (Signed)
PROGRESS NOTE  Edwin Osborne:[id number] DOB: 1935/08/16 DOA: 05/04/2020 PCP: Unk Pinto, MD   LOS: 1 day   Brief narrative: As per HPI,  Edwin Osborne is a 85 y.o. male with medical history significant for hypertension, hyperlipidemia, diabetes mellitus, stage III CKD presented to the hospital with fever and dysuria. Patient does have a history of recent fall with hitting his right ribs with some chest discomfort. He did have mild cough as well. He was seen in the ER on 7/30 following his fall and was recommended for admission and for SNF by PT.  He was noted to have a rib contusion and required [place] O2 which was thought to be due to splinting and poor inspiratory effort.  He left AMA.  He followed up with his PCP on 8/5 and was reporting exertional SOB; while family was concerned about CHF, the NP who saw him thought it was likely to be acute bronchitis and started [place].  He was also noted to have recently completed Z-pack. He was noted to have fever with abnormal urinalysis and had signs of sepsis likely secondary to UTI. Patient was then admitted to the hospital.  Assessment/Plan:  Principal Problem:   Sepsis secondary to UTI [facility]) Active Problems:   Essential hypertension   Type 2 diabetes mellitus with stage 3 chronic kidney disease, without long-term current use of insulin (HCC)   Hyperlipidemia associated with type 2 diabetes mellitus ([place])   Stage 3 chronic kidney disease due to type 2 diabetes mellitus (HCC)   Class 1 obesity due to excess calories with body mass index (BMI) of 33.0 to 33.9 in adult  Sepsis due to UTI Patient did have leukocytosis fever tachycardia tachypnea on presentation with borderline low blood pressure and mild hypoxia.. Infection likely from UTI with underlying BPH.. Follow blood cultures urine cultures. Continue on IV Rocephin. Procalcitonin was elevated which is trended down.  Hypoxia -On presentation. Recent fall with rib  fracture. Chest x-ray negative. Patient was advised long deep breaths and incentive based primary possible COPD component. Continue steroids, monitor oxygen. Continue Armed forces operational officer for now. Repeated chest x-ray 05/05/2020 showed normal findings.  Essential hypertension. Continue Cardura. Lisinopril on hold  Hyperlipidemia. -Continue pravachol  DM type II. Hemoglobin A1c 5 months back was 9.6. Hold Amaryl. Continue moderate scale sliding scale insulin for now. Closely monitor.  Stage 4 CKD Continue to hold lisinopril, received gentle IV hydration. Monitor BMP in a.m. had IV fluid bolus on presentation.   Obesity Body mass index is 33.29 kg/m.  Would benefit from weight loss as outpatient.   DVT prophylaxis: enoxaparin (LOVENOX) injection 30 mg Start: 05/04/20 1645   Code Status: DNR  Family Communication: None at bedside  Status is: Inpatient  Remains inpatient appropriate because:Inpatient level of care appropriate due to severity of illness   Dispo: The patient is from: Home              Anticipated d/c is to: Home              Anticipated d/c date is: 2 days              Patient currently is not medically stable to d/c.   Consultants:  None  Procedures:  None  Antibiotics:  . Rocephin IV  Anti-infectives (From admission, onward)   Start     Dose/Rate Route Frequency Ordered Stop   05/04/20 1415  cefTRIAXone (ROCEPHIN) 1 g in sodium chloride 0.9 % 100  mL IVPB     Discontinue     1 g 200 mL/hr over 30 Minutes Intravenous Every 24 hours 05/04/20 1410       Subjective: Today, patient was seen and examined at bedside. Complains of mild left chest wall pain. Feels much better in terms of strength today. Denies any fever, chills, rigors, shortness of breath.  Objective: Vitals:   05/05/20 0345 05/05/20 0800  BP: 114/61 (!) 122/54  Pulse: (!) 108 (!) 103  Resp: 19 19  Temp: 98.2 F (36.8 C) 100 F (37.8 C)  SpO2: 95%     Intake/Output  Summary (Last 24 hours) at 05/05/2020 1052 Last data filed at 05/05/2020 0900 Gross per 24 hour  Intake 3047.5 ml  Output 551 ml  Net 2496.5 ml   Filed Weights   05/04/20 1450 05/04/20 2330  Weight: 105.2 kg 110.1 kg   Body mass index is 34.83 kg/m.   Physical Exam: GENERAL: Patient is alert awake and oriented. Not in obvious distress. Obese HENT: No scleral pallor or icterus. Pupils equally reactive to light. Oral mucosa is moist NECK: is supple, no gross swelling noted. CHEST: Clear to auscultation. No crackles or wheezes.  Diminished breath sounds bilaterally. Mild tenderness on the left lateral chest. CVS: S1 and S2 heard, no murmur. Regular rate and rhythm.  ABDOMEN: Soft, non-tender, bowel sounds are present. External urinary catheter in place EXTREMITIES: No edema. CNS: Cranial nerves are intact. No focal motor deficits. SKIN: warm and dry without rashes.  Data Review: I have personally reviewed the following laboratory data and studies,  CBC: Recent Labs  Lab 05/04/20 1409 05/05/20 0457  WBC 25.6* 23.9*  NEUTROABS 24.1*  --   HGB 13.8 12.5*  HCT 42.3 38.7*  MCV 89.8 90.0  PLT 196 376   Basic Metabolic Panel: Recent Labs  Lab 05/02/20 1117 05/04/20 1409 05/05/20 0457  NA 139 135 138  K 5.1 4.5 4.9  CL 101 102 103  CO2 29 22 26   GLUCOSE 79 159* 119*  BUN 34* 29* 21  CREATININE 2.26* 2.31* 2.21*  CALCIUM 10.0 9.2 8.7*   Liver Function Tests: Recent Labs  Lab 05/04/20 1409  AST 17  ALT 21  ALKPHOS 68  BILITOT 1.2  PROT 6.2*  ALBUMIN 3.4*   No results for input(s): LIPASE, AMYLASE in the last 168 hours. No results for input(s): AMMONIA in the last 168 hours. Cardiac Enzymes: No results for input(s): CKTOTAL, CKMB, CKMBINDEX, TROPONINI in the last 168 hours. BNP (last 3 results) Recent Labs    04/26/20 1548  BNP 7    ProBNP (last 3 results) No results for input(s): PROBNP in the last 8760 hours.  CBG: Recent Labs  Lab 05/04/20 1658  05/04/20 2226 05/05/20 0832  GLUCAP 117* 96 216*   Recent Results (from the past 240 hour(s))  SARS Coronavirus 2 by RT PCR (hospital order, performed in [facility] hospital lab) Nasopharyngeal Nasopharyngeal Swab     Status: None   Collection Time: 05/04/20  2:23 PM   Specimen: Nasopharyngeal Swab  Result Value Ref Range Status   SARS Coronavirus 2 NEGATIVE NEGATIVE Final    Comment: (NOTE) SARS-CoV-2 target nucleic acids are NOT DETECTED.  The SARS-CoV-2 RNA is generally detectable in upper and lower respiratory specimens during the acute phase of infection. The lowest concentration of SARS-CoV-2 viral copies this assay can detect is 250 copies / mL. A negative result does not preclude SARS-CoV-2 infection and should not be used as the  sole basis for treatment or other patient management decisions.  A negative result may occur with improper specimen collection / handling, submission of specimen other than nasopharyngeal swab, presence of viral mutation(s) within the areas targeted by this assay, and inadequate number of viral copies (<250 copies / mL). A negative result must be combined with clinical observations, patient history, and epidemiological information.  Fact Sheet for Patients:   StrictlyIdeas.no  Fact Sheet for Healthcare Providers: BankingDealers.co.za  This test is not yet approved or  cleared by the [place] FDA and has been authorized for detection and/or diagnosis of SARS-CoV-2 by FDA under an Emergency Use Authorization (EUA).  This EUA will remain in effect (meaning this test can be used) for the duration of the COVID-19 declaration under Section 564(b)(1) of the Act, 21 U.S.C. section 360bbb-3(b)(1), unless the authorization is terminated or revoked sooner.  Performed at [facility], [place] [address], [place], [place] [address]      Studies: DG Chest 2 View  Result Date: 05/05/2020 CLINICAL  DATA:  Hypoxia EXAM: CHEST - 2 VIEW COMPARISON:  05/04/2020 FINDINGS: Cardiac shadow is mildly enlarged but stable. Aortic calcifications are seen. The overall inspiratory effort is poor but the lungs remain clear. No bony abnormality is seen. IMPRESSION: No acute abnormality noted. Electronically Signed   By: [name] M.D.   On: 05/05/2020 08:21   US RENAL  Result Date: 05/04/2020 CLINICAL DATA:  Sepsis, UTI EXAM: RENAL / URINARY TRACT ULTRASOUND COMPLETE COMPARISON:  CT 04/20/2020 FINDINGS: Right Kidney: Renal measurements: 10.4 x 6.0 x 6.6 cm = volume: 217 mL . Echogenicity within normal limits. No mass or hydronephrosis visualized. Left Kidney: Renal measurements: 11.9 x 6.3 x 5.1 cm = volume: 200 mL. 2.5 cm lower pole cyst. Normal echotexture. No hydronephrosis. Bladder: Decompressed, not visualized Other: None. IMPRESSION: No acute findings.  No hydronephrosis. Electronically Signed   By: [name] M.D.   On: 05/04/2020 17:31   DG Chest Port 1 View  Result Date: 05/04/2020 CLINICAL DATA:  Fever since yesterday. Hematuria. History of hypertension. EXAM: PORTABLE CHEST 1 VIEW COMPARISON:  04/19/2020. FINDINGS: Cardiac silhouette is normal in size. No mediastinal or hilar masses. Mild linear opacities at the left lung base consistent with scarring and/or atelectasis. Lungs otherwise clear. No pleural effusion or pneumothorax. Skeletal structures are demineralized but grossly intact. IMPRESSION: No active disease. Electronically Signed   By: [name] M.D.   On: 05/04/2020 14:38      Flora Lipps, MD  [facility] 05/05/2020

## 2020-05-06 DIAGNOSIS — J9601 Acute respiratory failure with hypoxia: Secondary | ICD-10-CM

## 2020-05-06 LAB — BASIC METABOLIC PANEL
Anion gap: 9 (ref 5–15)
BUN: 25 mg/dL — ABNORMAL HIGH (ref 8–23)
CO2: 24 mmol/L (ref 22–32)
Calcium: 8.8 mg/dL — ABNORMAL LOW (ref 8.9–10.3)
Chloride: 103 mmol/L (ref 98–111)
Creatinine, Ser: 1.85 mg/dL — ABNORMAL HIGH (ref 0.61–1.24)
GFR calc Af Amer: 38 mL/min — ABNORMAL LOW (ref >60)
GFR calc non Af Amer: 33 mL/min — ABNORMAL LOW (ref >60)
Glucose, Bld: 166 mg/dL — ABNORMAL HIGH (ref 70–99)
Potassium: 4.5 mmol/L (ref 3.5–5.1)
Sodium: 136 mmol/L (ref 135–145)

## 2020-05-06 LAB — CBC
HCT: 35.6 % — ABNORMAL LOW (ref 39.0–52.0)
Hemoglobin: 11.9 g/dL — ABNORMAL LOW (ref 13.0–17.0)
MCH: 29.8 pg (ref 26.0–34.0)
MCHC: 33.4 g/dL (ref 30.0–36.0)
MCV: 89 fL (ref 80.0–100.0)
Platelets: 164 10*3/uL (ref 150–400)
RBC: 4 MIL/uL — ABNORMAL LOW (ref 4.22–5.81)
RDW: 12.4 % (ref 11.5–15.5)
WBC: 19.6 10*3/uL — ABNORMAL HIGH (ref 4.0–10.5)
nRBC: 0 % (ref 0.0–0.2)

## 2020-05-06 LAB — URINE CULTURE: Culture: 70000 — AB

## 2020-05-06 LAB — MAGNESIUM: Magnesium: 1.9 mg/dL (ref 1.7–2.4)

## 2020-05-06 LAB — GLUCOSE, CAPILLARY
Glucose-Capillary: 140 mg/dL — ABNORMAL HIGH (ref 70–99)
Glucose-Capillary: 180 mg/dL — ABNORMAL HIGH (ref 70–99)
Glucose-Capillary: 241 mg/dL — ABNORMAL HIGH (ref 70–99)
Glucose-Capillary: 217 mg/dL — ABNORMAL HIGH (ref 70–99)

## 2020-05-06 MED ORDER — ALBUTEROL SULFATE (2.5 MG/3ML) 0.083% IN NEBU: 2.5000 mg | INHALATION_SOLUTION | Freq: Four times a day (QID) | RESPIRATORY_TRACT | Status: DC | PRN

## 2020-05-06 MED ORDER — GUAIFENESIN-DM 100-10 MG/5ML PO SYRP: 5.0000 mL | ORAL_SOLUTION | ORAL | Status: DC | PRN

## 2020-05-06 MED ORDER — ENOXAPARIN SODIUM 40 MG/0.4ML ~~LOC~~ SOLN
40.0000 mg | SUBCUTANEOUS | Status: DC
  Administered 2020-05-06: 40 mg via SUBCUTANEOUS
  Filled 2020-05-06: qty 0.4

## 2020-05-06 NOTE — Progress Notes (Addendum)
PROGRESS NOTE  Edwin Osborne EXH:[id number] DOB: [birth year] DOA: 05/04/2020 PCP: Unk Pinto, MD   LOS: 2 days   Brief narrative: As per HPI,  Edwin Osborne is a 85 y.o. male with medical history significant for hypertension, hyperlipidemia, diabetes mellitus, stage III CKD presented to the hospital with fever and dysuria. Patient does have a history of recent fall with hitting his right ribs with some chest discomfort. He did have mild cough as well. He was seen in the ER on 7/30 following his fall and was recommended for admission and for SNF by PT.  He was noted to have a rib contusion and required [place] O2 which was thought to be due to splinting and poor inspiratory effort.  He left AMA.  He followed up with his PCP on 8/5 and was reporting exertional SOB; while family was concerned about CHF, the NP who saw him thought it was likely to be acute bronchitis and started [place].  He was also noted to have recently completed Z-pack. He was noted to have fever with abnormal urinalysis and had signs of sepsis likely secondary to UTI. Patient was then admitted to the hospital.  Assessment/Plan:  Principal Problem:   Sepsis secondary to UTI [facility]) Active Problems:   Essential hypertension   Type 2 diabetes mellitus with stage 3 chronic kidney disease, without long-term current use of insulin (HCC)   Hyperlipidemia associated with type 2 diabetes mellitus ([place])   Stage 3 chronic kidney disease due to type 2 diabetes mellitus (HCC)   Class 1 obesity due to excess calories with body mass index (BMI) of 33.0 to 33.9 in adult  Sepsis due to UTI Present on admission.  Blood cultures negative so far.  Urine culture showing Klebsiella pneumonia.  Currently on Rocephin.  Clinically improving.  Leukocytosis trending down.  Hypoxia on presentation -On presentation. Recent fall with rib fracture.  Likely secondary to splinting.  Encouraged incentive spirometry.  Possible  underlying COPD. Continue Armed forces operational officer for now. Repeated chest x-ray 05/05/2020 showed normal findings.  On 2 L of oxygen by nasal cannula.  Encourage slow deep breathing.  Ambulate the patient as able.  On prednisone.  Wean oxygen as able.  Essential hypertension. Continue [place]. Lisinopril on hold.  Blood pressure is stable.  Hyperlipidemia. -Continue pravachol  DM type II. Hemoglobin A1c 5 months back was 9.6. Hold Amaryl. Continue moderate scale sliding scale insulin, Accu-Cheks, diabetic diet closely monitor.  Latest POC glucose of 140.  Acute kidney injury on stage 4 CKD Continue to hold lisinopril, received gentle IV hydration.Had IV fluid bolus on presentation for sepsis..  Creatinine of 1.8 today.  Creatinine on presentation was 2.2  Obesity Body mass index is 33.29 kg/m.  Would benefit from weight loss as outpatient.   DVT prophylaxis: enoxaparin (LOVENOX) injection 40 mg Start: 05/06/20 1800   Code Status: DNR  Family Communication: None at bedside  Status is: Inpatient  Remains inpatient appropriate because:Inpatient level of care appropriate due to severity of illness   Dispo: The patient is from: Home              Anticipated d/c is to: Home              Anticipated d/c date is: 2 days              Patient currently is not medically stable to d/c.  Consultants:  None  Procedures:  None  Antibiotics:  . Rocephin IV  Anti-infectives (From admission, onward)   Start     Dose/Rate Route Frequency Ordered Stop   05/04/20 1415  cefTRIAXone (ROCEPHIN) 1 g in sodium chloride 0.9 % 100 mL IVPB     Discontinue     1 g 200 mL/hr over 30 Minutes Intravenous Every 24 hours 05/04/20 1410       Subjective: Today, patient was seen and examined at bedside.  Complains of mild chest wall pain on taking a deep breath.  No fever chills or shortness of breath.  Feels  stronger than yesterday.  Wants to ambulate.  Objective: Vitals:   05/06/20 0542  05/06/20 0925  BP: 138/71 127/64  Pulse: 86 83  Resp: 18 18  Temp: 97.8 F (36.6 C) 97.8 F (36.6 C)  SpO2: 94% 96%    Intake/Output Summary (Last 24 hours) at 05/06/2020 1012 Last data filed at 05/06/2020 0800 Gross per 24 hour  Intake 1800 ml  Output 3500 ml  Net -1700 ml   Filed Weights   05/04/20 1450 05/04/20 2330 05/05/20 2156  Weight: 105.2 kg 110.1 kg 112.9 kg   Body mass index is 35.71 kg/m.   Physical Exam: GENERAL: Patient is alert awake and oriented. Not in obvious distress. Obese HENT: No scleral pallor or icterus. Pupils equally reactive to light. Oral mucosa is moist NECK: is supple, no gross swelling noted. CHEST: Clear to auscultation. No crackles or wheezes.  Diminished breath sounds bilaterally. Mild tenderness on the left lateral chest. CVS: S1 and S2 heard, no murmur. Regular rate and rhythm.  ABDOMEN: Soft, non-tender, bowel sounds are present. External urinary catheter in place EXTREMITIES: No edema. CNS: Cranial nerves are intact. No focal motor deficits. SKIN: warm and dry without rashes.  Data Review: I have personally reviewed the following laboratory data and studies,  CBC: Recent Labs  Lab 05/04/20 1409 05/05/20 0457 05/06/20 0359  WBC 25.6* 23.9* 19.6*  NEUTROABS 24.1*  --   --   HGB 13.8 12.5* 11.9*  HCT 42.3 38.7* 35.6*  MCV 89.8 90.0 89.0  PLT 196 181 161   Basic Metabolic Panel: Recent Labs  Lab 05/02/20 1117 05/04/20 1409 05/05/20 0457 05/06/20 0359  NA 139 135 138 136  K 5.1 4.5 4.9 4.5  CL 101 102 103 103  CO2 29 22 26 24   GLUCOSE 79 159* 119* 166*  BUN 34* 29* 21 25*  CREATININE 2.26* 2.31* 2.21* 1.85*  CALCIUM 10.0 9.2 8.7* 8.8*  MG  --   --   --  1.9   Liver Function Tests: Recent Labs  Lab 05/04/20 1409  AST 17  ALT 21  ALKPHOS 68  BILITOT 1.2  PROT 6.2*  ALBUMIN 3.4*   No results for input(s): LIPASE, AMYLASE in the last 168 hours. No results for input(s): AMMONIA in the last 168 hours. Cardiac  Enzymes: No results for input(s): CKTOTAL, CKMB, CKMBINDEX, TROPONINI in the last 168 hours. BNP (last 3 results) Recent Labs    04/26/20 1548  BNP 7    ProBNP (last 3 results) No results for input(s): PROBNP in the last 8760 hours.  CBG: Recent Labs  Lab 05/05/20 0832 05/05/20 1129 05/05/20 1636 05/05/20 2158 05/06/20 0703  GLUCAP 216* 230* 256* 202* 140*   Recent Results (from the past 240 hour(s))  Urine culture     Status: Abnormal   Collection Time: 05/04/20  2:13 PM   Specimen: In/Out Cath Urine  Result Value Ref Range Status   Specimen Description IN/OUT CATH URINE  Final   Special Requests   Final    NONE Performed at [facility], [place] [address], [place], [place] [address]    Culture 70,000 COLONIES/mL KLEBSIELLA PNEUMONIAE (A)  Final   Report Status 05/06/2020 FINAL  Final   Organism ID, Bacteria KLEBSIELLA PNEUMONIAE (A)  Final      Susceptibility   Klebsiella pneumoniae - MIC*    AMPICILLIN >=32 RESISTANT Resistant     CEFAZOLIN <=4 SENSITIVE Sensitive     CEFTRIAXONE <=0.25 SENSITIVE Sensitive     CIPROFLOXACIN <=0.25 SENSITIVE Sensitive     GENTAMICIN <=1 SENSITIVE Sensitive     IMIPENEM <=0.25 SENSITIVE Sensitive     NITROFURANTOIN 64 INTERMEDIATE Intermediate     TRIMETH/SULFA <=20 SENSITIVE Sensitive     AMPICILLIN/SULBACTAM 8 SENSITIVE Sensitive     PIP/TAZO <=4 SENSITIVE Sensitive     * 70,000 COLONIES/mL KLEBSIELLA PNEUMONIAE  SARS Coronavirus 2 by RT PCR (hospital order, performed in [place] hospital lab) Nasopharyngeal Nasopharyngeal Swab     Status: None   Collection Time: 05/04/20  2:23 PM   Specimen: Nasopharyngeal Swab  Result Value Ref Range Status   SARS Coronavirus 2 NEGATIVE NEGATIVE Final    Comment: (NOTE) SARS-CoV-2 target nucleic acids are NOT DETECTED.  The SARS-CoV-2 RNA is generally detectable in upper and lower respiratory specimens during the acute phase of infection. The lowest concentration of SARS-CoV-2  viral copies this assay can detect is 250 copies / mL. A negative result does not preclude SARS-CoV-2 infection and should not be used as the sole basis for treatment or other patient management decisions.  A negative result may occur with improper specimen collection / handling, submission of specimen other than nasopharyngeal swab, presence of viral mutation(s) within the areas targeted by this assay, and inadequate number of viral copies (<250 copies / mL). A negative result must be combined with clinical observations, patient history, and epidemiological information.  Fact Sheet for Patients:   StrictlyIdeas.no  Fact Sheet for Healthcare Providers: BankingDealers.co.za  This test is not yet approved or  cleared by the [place] FDA and has been authorized for detection and/or diagnosis of SARS-CoV-2 by FDA under an Emergency Use Authorization (EUA).  This EUA will remain in effect (meaning this test can be used) for the duration of the COVID-19 declaration under Section 564(b)(1) of the Act, 21 U.S.C. section 360bbb-3(b)(1), unless the authorization is terminated or revoked sooner.  Performed at [facility], [place] [address] [place], [place] [address]   Blood Culture (routine x 2)     Status: None (Preliminary result)   Collection Time: 05/04/20  2:41 PM   Specimen: BLOOD  Result Value Ref Range Status   Specimen Description BLOOD RIGHT ANTECUBITAL  Final   Special Requests   Final    BOTTLES DRAWN AEROBIC AND ANAEROBIC Blood Culture adequate volume   Culture   Final    NO GROWTH < 24 HOURS Performed at [facility], [place] [address] [place], [place] [address]    Report Status PENDING  Incomplete  Blood Culture (routine x 2)     Status: None (Preliminary result)   Collection Time: 05/04/20  2:46 PM   Specimen: BLOOD  Result Value Ref Range Status   Specimen Description BLOOD LEFT ANTECUBITAL  Final   Special  Requests   Final    BOTTLES DRAWN AEROBIC AND ANAEROBIC Blood Culture results may not be optimal due to an inadequate volume of blood received in  culture bottles   Culture   Final    NO GROWTH < 24 HOURS Performed at [facility], [place] [address], [place], [place] [address]    Report Status PENDING  Incomplete     Studies: DG Chest 2 View  Result Date: 05/05/2020 CLINICAL DATA:  Hypoxia EXAM: CHEST - 2 VIEW COMPARISON:  05/04/2020 FINDINGS: Cardiac shadow is mildly enlarged but stable. Aortic calcifications are seen. The overall inspiratory effort is poor but the lungs remain clear. No bony abnormality is seen. IMPRESSION: No acute abnormality noted. Electronically Signed   By: [name] M.D.   On: 05/05/2020 08:21   US RENAL  Result Date: 05/04/2020 CLINICAL DATA:  Sepsis, UTI EXAM: RENAL / URINARY TRACT ULTRASOUND COMPLETE COMPARISON:  CT 04/20/2020 FINDINGS: Right Kidney: Renal measurements: 10.4 x 6.0 x 6.6 cm = volume: 217 mL . Echogenicity within normal limits. No mass or hydronephrosis visualized. Left Kidney: Renal measurements: 11.9 x 6.3 x 5.1 cm = volume: 200 mL. 2.5 cm lower pole cyst. Normal echotexture. No hydronephrosis. Bladder: Decompressed, not visualized Other: None. IMPRESSION: No acute findings.  No hydronephrosis. Electronically Signed   By: [name] M.D.   On: 05/04/2020 17:31   DG Chest Port 1 View  Result Date: 05/04/2020 CLINICAL DATA:  Fever since yesterday. Hematuria. History of hypertension. EXAM: PORTABLE CHEST 1 VIEW COMPARISON:  04/19/2020. FINDINGS: Cardiac silhouette is normal in size. No mediastinal or hilar masses. Mild linear opacities at the left lung base consistent with scarring and/or atelectasis. Lungs otherwise clear. No pleural effusion or pneumothorax. Skeletal structures are demineralized but grossly intact. IMPRESSION: No active disease. Electronically Signed   By: [name] M.D.   On: 05/04/2020 14:38      Flora Lipps,  MD  [facility] 05/06/2020

## 2020-05-06 NOTE — Evaluation (Signed)
Physical Therapy Evaluation & Discharge Patient Details Name: Edwin Osborne MRN: [id number] DOB: [birth year] Today's Date: 05/06/2020   History of Present Illness  Pt is an 85 y.o. male admitted 05/04/20 with fever and dysuria, also with recent fall hitting R ribs (was seen in ER 7/30 after this and SNF recommended by PT; pt left AMA). Workup for sepsis due to UTI, hypoxia with recent rib fx and likely underlying COPD. Other PMH includes HTN, DM2, CKD 4, obesity.    Clinical Impression  Patient evaluated by Physical Therapy with no further acute PT needs identified. PTA, pt independent and lives with supportive wife, has been limiting by some SOB and rib pain since fall a couple weeks prior. Today, pt moving well at supervision-level; SpO2 >/94% on RA. Educ on importance of mobility, activity recommendations, initial supervision for safety (pt and wife in agreement). All education has been completed and the patient has no further questions. Acute PT is signing off. Thank you for this referral.    Follow Up Recommendations No PT follow up;Supervision for mobility/OOB    Equipment Recommendations  None recommended by PT    Recommendations for Other Services       Precautions / Restrictions Precautions Precautions: Fall Restrictions Weight Bearing Restrictions: No      Mobility  Bed Mobility Overal bed mobility: Modified Independent Bed Mobility: Supine to Sit     Supine to sit: Modified independent (Device/Increase time);HOB elevated     General bed mobility comments: Reliant on momentum, noted grimacing  Transfers Overall transfer level: Needs assistance Equipment used: None Transfers: Sit to/from Stand Sit to Stand: Min guard         General transfer comment: Reaching for HHA to maintain stability upon initial standing  Ambulation/Gait Ambulation/Gait assistance: Min guard;Supervision Gait Distance (Feet): 80 Feet Assistive device: None Gait Pattern/deviations:  Step-through pattern;Decreased stride length;Wide base of support Gait velocity: Decreased   General Gait Details: Slow, mildly unsteady gait with initial min guard; stability improving with distance, progressing to supervision for safety/balance. SpO2 >/94% on RA  Stairs            Wheelchair Mobility    Modified Rankin (Stroke Patients Only)       Balance Overall balance assessment: Mild deficits observed, not formally tested                                           Pertinent Vitals/Pain Pain Assessment: Faces Faces Pain Scale: Hurts little more Pain Location: R-side posterior ribs Pain Descriptors / Indicators: Discomfort Pain Intervention(s): Monitored during session;Ice applied    Home Living Family/patient expects to be discharged to:: Private residence Living Arrangements: Spouse/significant other Available Help at Discharge: Family;Available 24 hours/day Type of Home: House Home Access: Stairs to enter   [employer] of Steps: 1 Home Layout: Two level;Able to live on main level with bedroom/bathroom Home Equipment: Shower seat - built in;Cane - single point Additional Comments: Very supportive wife, also active    Prior Function Level of Independence: Independent         Comments: Drives, uses weedeater     Hand Dominance        Extremity/Trunk Assessment   Upper Extremity Assessment Upper Extremity Assessment: Overall WFL for tasks assessed    Lower Extremity Assessment Lower Extremity Assessment: Overall WFL for tasks assessed       Communication  Communication: HOH  Cognition Arousal/Alertness: Awake/alert Behavior During Therapy: WFL for tasks assessed/performed Overall Cognitive Status: Within Functional Limits for tasks assessed                                        General Comments General comments (skin integrity, edema, etc.): Wife present and supportive. Spoke with RN and  family, pt ok to get up with supervision for staff or family; pt understands not to get up without someone present    Exercises     Assessment/Plan    PT Assessment Patent does not need any further PT services  PT Problem List         PT Treatment Interventions      PT Goals (Current goals can be found in the Care Plan section)  Acute Rehab PT Goals PT Goal Formulation: All assessment and education complete, DC therapy    Frequency     Barriers to discharge        Co-evaluation               AM-PAC PT "6 Clicks" Mobility  Outcome Measure Help needed turning from your back to your side while in a flat bed without using bedrails?: None Help needed moving from lying on your back to sitting on the side of a flat bed without using bedrails?: None Help needed moving to and from a bed to a chair (including a wheelchair)?: None Help needed standing up from a chair using your arms (e.g., wheelchair or bedside chair)?: A Little Help needed to walk in hospital room?: A Little Help needed climbing 3-5 steps with a railing? : A Little 6 Click Score: 21    End of Session   Activity Tolerance: Patient tolerated treatment well Patient left: in bed;with call bell/phone within reach;with family/visitor present;with nursing/sitter in room Nurse Communication: Mobility status PT Visit Diagnosis: Other abnormalities of gait and mobility (R26.89)    Time: 9292-4462 PT Time Calculation (min) (ACUTE ONLY): 21 min   Charges:   PT Evaluation $PT Eval Low Complexity: [place], PT, DPT Acute Rehabilitation Services  Pager [phone] Office [place] 05/06/2020, 12:13 PM

## 2020-05-07 LAB — CBC
HCT: 37 % — ABNORMAL LOW (ref 39.0–52.0)
Hemoglobin: 12.3 g/dL — ABNORMAL LOW (ref 13.0–17.0)
MCH: 29.6 pg (ref 26.0–34.0)
MCHC: 33.2 g/dL (ref 30.0–36.0)
MCV: 88.9 fL (ref 80.0–100.0)
Platelets: 190 10*3/uL (ref 150–400)
RBC: 4.16 MIL/uL — ABNORMAL LOW (ref 4.22–5.81)
RDW: 12.2 % (ref 11.5–15.5)
WBC: 12.9 10*3/uL — ABNORMAL HIGH (ref 4.0–10.5)
nRBC: 0 % (ref 0.0–0.2)

## 2020-05-07 LAB — BASIC METABOLIC PANEL
Anion gap: 9 (ref 5–15)
BUN: 36 mg/dL — ABNORMAL HIGH (ref 8–23)
CO2: 25 mmol/L (ref 22–32)
Calcium: 8.7 mg/dL — ABNORMAL LOW (ref 8.9–10.3)
Chloride: 101 mmol/L (ref 98–111)
Creatinine, Ser: 2.01 mg/dL — ABNORMAL HIGH (ref 0.61–1.24)
GFR calc Af Amer: 35 mL/min — ABNORMAL LOW (ref >60)
GFR calc non Af Amer: 30 mL/min — ABNORMAL LOW (ref >60)
Glucose, Bld: 200 mg/dL — ABNORMAL HIGH (ref 70–99)
Potassium: 4.6 mmol/L (ref 3.5–5.1)
Sodium: 135 mmol/L (ref 135–145)

## 2020-05-07 LAB — MAGNESIUM: Magnesium: 1.9 mg/dL (ref 1.7–2.4)

## 2020-05-07 LAB — GLUCOSE, CAPILLARY: Glucose-Capillary: 176 mg/dL — ABNORMAL HIGH (ref 70–99)

## 2020-05-07 MED ORDER — GUAIFENESIN-DM 100-10 MG/5ML PO SYRP: 5.0000 mL | ORAL_SOLUTION | ORAL | 0 refills | Status: DC | PRN

## 2020-05-07 MED ORDER — CEPHALEXIN 500 MG PO CAPS: 500.0000 mg | ORAL_CAPSULE | Freq: Two times a day (BID) | ORAL | 0 refills | Status: AC

## 2020-05-07 MED ORDER — CEPHALEXIN 500 MG PO CAPS: 500.0000 mg | ORAL_CAPSULE | Freq: Two times a day (BID) | ORAL | Status: DC

## 2020-05-07 NOTE — Discharge Summary (Addendum)
Physician Discharge Summary  Edwin Osborne SFK:[id number] DOB: 16-May-1935 DOA: 05/04/2020  PCP: Unk Pinto, MD  Admit date: 05/04/2020 Discharge date: 05/07/2020  Admitted From: Home  Discharge disposition: Home  Recommendations for Outpatient Follow-Up:   . Follow up with your primary care provider in one week.  . Check CBC, BMP, mg in the next visit  . Follow-up with your nephrologist which has been scheduled as per your primary care physician for your chronic kidney disease. . Patient has been prescribed Keflex for UTI.    Discharge Diagnosis:   Principal Problem:   Sepsis secondary to UTI [facility]) Active Problems:   Essential hypertension   Type 2 diabetes mellitus with stage 3 chronic kidney disease, without long-term current use of insulin (HCC)   Hyperlipidemia associated with type 2 diabetes mellitus ([place])   Stage 3 chronic kidney disease due to type 2 diabetes mellitus (HCC)   Class 1 obesity due to excess calories with body mass index (BMI) of 33.0 to 33.9 in adult  Discharge Condition: Improved.  Diet recommendation: Low sodium, heart healthy.  Carbohydrate-modified.    Wound care: None.  Code status: DNR   History of Present Illness:   Edwin Lamphier Swoffordis a 85 y.o.malewith medical history significant for hypertension, hyperlipidemia, diabetes mellitus, stage III CKD presented to the hospital with fever and dysuria. Patient does have a history of recent fall with hitting his right ribs with some chest discomfort. He did have mild cough as well. He was seen in the ER on 7/30 following his fall and was recommended for admission and for SNF by PT.  Patient was noted to have a rib contusion and required [place] O2 which was thought to be due to splinting and poor inspiratory effort. He left AMA. He followed up with his PCP on 8/5 and was reporting exertional SOB; while family was concerned about CHF, the NP who saw him thought it was likely to be acute  bronchitis and started [place]. He was also noted to have recently completed Z-pack. He was noted to have fever with abnormal urinalysis and had signs of sepsis likely secondary to UTI. Patient was then admitted to the hospital.  Hospital Course:   Following conditions were addressed during hospitalization as listed below,  Sepsis due to Klebsiella UTI Present on admission.  Blood cultures negative so far.  Urine culture showed Klebsiella pneumoniae.  Patient received Rocephin IV during hospitalization and clinically improved.  Patient will be changed to oral Keflex renally dosed for next 5 days on discharge to complete the course.  Clinically improving.  Patient was afebrile but had mild leukocytosis. Will need to follow up with CBC.  Hypoxia on presentation, resolved.  Patient had recent fall with rib fracture.    Patient was given incentive spirometry, encouraged deep breathing.  Chest x-ray without any acute findings.  Patient was initially on some oxygen which was weaned off.  Patient was given some prednisone and inhalers for possible underlying COPD.  At this time, patient has remained stable.    Essential hypertension. Continue Cardura, Lisinopril.  Will need closer monitoring of renal function as outpatient.  Hyperlipidemia. -Continue pravachol  DM type II. Hemoglobin A1c 5 months back was 9.6.  Will be resumed on oral hypoglycemic agents on discharge.   CKD stage IIIb Creatinine on presentation was 2.2.  Received IV fluid bolus during hospitalization.  Creatinine at 2.0 at this time.  Patient is supposed to follow-up with nephrology as outpatient which has  been scheduled.  Obesity Body mass index is 33.29 kg/m. Would benefit from weight loss as outpatient.  Disposition.  At this time, patient is stable for disposition home.  Patient was seen by physical therapy who did not recommend any skilled needs.  Medical Consultants:    None.  Procedures:     None Subjective:   Today, patient feels okay.  Denies any fever, chills, rigors or dysuria.  Wishes to go home desperately for his wife's birthday today.  Discharge Exam:   Vitals:   05/07/20 0908 05/07/20 0918  BP: 139/75   Pulse: 95   Resp: 18   Temp: 98.5 F (36.9 C)   SpO2: 95% 98%   Vitals:   05/06/20 2118 05/07/20 0530 05/07/20 0908 05/07/20 0918  BP: (!) 154/71 (!) 148/79 139/75   Pulse: 82 83 95   Resp: _0 Temp: 98.2 F (36.8 C) 97.7 F (36.5 C) 98.5 F (36.9 C)   TempSrc: Oral Oral Oral   SpO2: 93% 96% 95% 98%  Weight:      Height:       General: Alert awake, not in obvious distress, obese HENT: pupils equally reacting to light,  No scleral pallor or icterus noted. Oral mucosa is moist.  Chest:  Clear breath sounds.  Diminished breath sounds bilaterally. No crackles or wheezes.  CVS: S1 &S2 heard. No murmur.  Regular rate and rhythm. Abdomen: Soft, nontender, nondistended.  Bowel sounds are heard.  External urinary catheter in place Extremities: No cyanosis, clubbing or edema.  Peripheral pulses are palpable. Psych: Alert, awake and oriented, normal mood CNS:  No cranial nerve deficits.  Power equal in all extremities.   Skin: Warm and dry.  No rashes noted.  The results of significant diagnostics from this hospitalization (including imaging, microbiology, ancillary and laboratory) are listed below for reference.     Diagnostic Studies:   DG Chest 2 View  Result Date: 05/05/2020 CLINICAL DATA:  Hypoxia EXAM: CHEST - 2 VIEW COMPARISON:  05/04/2020 FINDINGS: Cardiac shadow is mildly enlarged but stable. Aortic calcifications are seen. The overall inspiratory effort is poor but the lungs remain clear. No bony abnormality is seen. IMPRESSION: No acute abnormality noted. Electronically Signed   By: [name] M.D.   On: 05/05/2020 08:21   US RENAL  Result Date: 05/04/2020 CLINICAL DATA:  Sepsis, UTI EXAM: RENAL / URINARY TRACT ULTRASOUND COMPLETE  COMPARISON:  CT 04/20/2020 FINDINGS: Right Kidney: Renal measurements: 10.4 x 6.0 x 6.6 cm = volume: 217 mL . Echogenicity within normal limits. No mass or hydronephrosis visualized. Left Kidney: Renal measurements: 11.9 x 6.3 x 5.1 cm = volume: 200 mL. 2.5 cm lower pole cyst. Normal echotexture. No hydronephrosis. Bladder: Decompressed, not visualized Other: None. IMPRESSION: No acute findings.  No hydronephrosis. Electronically Signed   By: [name] M.D.   On: 05/04/2020 17:31   DG Chest Port 1 View  Result Date: 05/04/2020 CLINICAL DATA:  Fever since yesterday. Hematuria. History of hypertension. EXAM: PORTABLE CHEST 1 VIEW COMPARISON:  04/19/2020. FINDINGS: Cardiac silhouette is normal in size. No mediastinal or hilar masses. Mild linear opacities at the left lung base consistent with scarring and/or atelectasis. Lungs otherwise clear. No pleural effusion or pneumothorax. Skeletal structures are demineralized but grossly intact. IMPRESSION: No active disease. Electronically Signed   By: [name] M.D.   On: 05/04/2020 14:38     Labs:   Basic Metabolic Panel: Recent Labs  Lab 05/02/20 1117 05/02/20 1117 05/04/20  1409 05/04/20 1409 05/05/20 0457 05/05/20 0457 05/06/20 0359 05/07/20 0324  NA 139  --  135  --  138  --  136 135  K 5.1   < > 4.5   < > 4.9   < > 4.5 4.6  CL 101  --  102  --  103  --  103 101  CO2 29  --  22  --  26  --  24 25  GLUCOSE 79  --  159*  --  119*  --  166* 200*  BUN 34*  --  29*  --  21  --  25* 36*  CREATININE 2.26*  --  2.31*  --  2.21*  --  1.85* 2.01*  CALCIUM 10.0  --  9.2  --  8.7*  --  8.8* 8.7*  MG  --   --   --   --   --   --  1.9 1.9   < > = values in this interval not displayed.   GFR Estimated Creatinine Clearance: 35.1 mL/min (A) (by C-G formula based on SCr of 2.01 mg/dL (H)). Liver Function Tests: Recent Labs  Lab 05/04/20 1409  AST 17  ALT 21  ALKPHOS 68  BILITOT 1.2  PROT 6.2*  ALBUMIN 3.4*   No results for input(s):  LIPASE, AMYLASE in the last 168 hours. No results for input(s): AMMONIA in the last 168 hours. Coagulation profile Recent Labs  Lab 05/04/20 1409  INR 1.2    CBC: Recent Labs  Lab 05/04/20 1409 05/05/20 0457 05/06/20 0359 05/07/20 0324  WBC 25.6* 23.9* 19.6* 12.9*  NEUTROABS 24.1*  --   --   --   HGB 13.8 12.5* 11.9* 12.3*  HCT 42.3 38.7* 35.6* 37.0*  MCV 89.8 90.0 89.0 88.9  PLT 196 181 164 190   Cardiac Enzymes: No results for input(s): CKTOTAL, CKMB, CKMBINDEX, TROPONINI in the last 168 hours. BNP: Invalid input(s): POCBNP CBG: Recent Labs  Lab 05/06/20 0703 05/06/20 1113 05/06/20 1606 05/06/20 2116 05/07/20 0630  GLUCAP 140* 180* 241* 217* 176*   D-Dimer No results for input(s): DDIMER in the last 72 hours. Hgb A1c No results for input(s): HGBA1C in the last 72 hours. Lipid Profile No results for input(s): CHOL, HDL, LDLCALC, TRIG, CHOLHDL, LDLDIRECT in the last 72 hours. Thyroid function studies No results for input(s): TSH, T4TOTAL, T3FREE, THYROIDAB in the last 72 hours.  Invalid input(s): FREET3 Anemia work up No results for input(s): VITAMINB12, FOLATE, FERRITIN, TIBC, IRON, RETICCTPCT in the last 72 hours. Microbiology Recent Results (from the past 240 hour(s))  Urine culture     Status: Abnormal   Collection Time: 05/04/20  2:13 PM   Specimen: In/Out Cath Urine  Result Value Ref Range Status   Specimen Description IN/OUT CATH URINE  Final   Special Requests   Final    NONE Performed at [facility], [address] [place], [place] [address]    Culture 70,000 COLONIES/mL KLEBSIELLA PNEUMONIAE (A)  Final   Report Status 05/06/2020 FINAL  Final   Organism ID, Bacteria KLEBSIELLA PNEUMONIAE (A)  Final      Susceptibility   Klebsiella pneumoniae - MIC*    AMPICILLIN >=32 RESISTANT Resistant     CEFAZOLIN <=4 SENSITIVE Sensitive     CEFTRIAXONE <=0.25 SENSITIVE Sensitive     CIPROFLOXACIN <=0.25 SENSITIVE Sensitive     GENTAMICIN <=1  SENSITIVE Sensitive     IMIPENEM <=0.25 SENSITIVE Sensitive  NITROFURANTOIN 64 INTERMEDIATE Intermediate     TRIMETH/SULFA <=20 SENSITIVE Sensitive     AMPICILLIN/SULBACTAM 8 SENSITIVE Sensitive     PIP/TAZO <=4 SENSITIVE Sensitive     * 70,000 COLONIES/mL KLEBSIELLA PNEUMONIAE  SARS Coronavirus 2 by RT PCR (hospital order, performed in [facility] hospital lab) Nasopharyngeal Nasopharyngeal Swab     Status: None   Collection Time: 05/04/20  2:23 PM   Specimen: Nasopharyngeal Swab  Result Value Ref Range Status   SARS Coronavirus 2 NEGATIVE NEGATIVE Final    Comment: (NOTE) SARS-CoV-2 target nucleic acids are NOT DETECTED.  The SARS-CoV-2 RNA is generally detectable in upper and lower respiratory specimens during the acute phase of infection. The lowest concentration of SARS-CoV-2 viral copies this assay can detect is 250 copies / mL. A negative result does not preclude SARS-CoV-2 infection and should not be used as the sole basis for treatment or other patient management decisions.  A negative result may occur with improper specimen collection / handling, submission of specimen other than nasopharyngeal swab, presence of viral mutation(s) within the areas targeted by this assay, and inadequate number of viral copies (<250 copies / mL). A negative result must be combined with clinical observations, patient history, and epidemiological information.  Fact Sheet for Patients:   StrictlyIdeas.no  Fact Sheet for Healthcare Providers: BankingDealers.co.za  This test is not yet approved or  cleared by the [place] FDA and has been authorized for detection and/or diagnosis of SARS-CoV-2 by FDA under an Emergency Use Authorization (EUA).  This EUA will remain in effect (meaning this test can be used) for the duration of the COVID-19 declaration under Section 564(b)(1) of the Act, 21 U.S.C. section 360bbb-3(b)(1), unless the  authorization is terminated or revoked sooner.  Performed at [facility], [place] [address], [place], [place] [address]   Blood Culture (routine x 2)     Status: None (Preliminary result)   Collection Time: 05/04/20  2:41 PM   Specimen: BLOOD  Result Value Ref Range Status   Specimen Description BLOOD RIGHT ANTECUBITAL  Final   Special Requests   Final    BOTTLES DRAWN AEROBIC AND ANAEROBIC Blood Culture adequate volume   Culture   Final    NO GROWTH 3 DAYS Performed at [facility], [place] [address] [place], [place] [address]    Report Status PENDING  Incomplete  Blood Culture (routine x 2)     Status: None (Preliminary result)   Collection Time: 05/04/20  2:46 PM   Specimen: BLOOD  Result Value Ref Range Status   Specimen Description BLOOD LEFT ANTECUBITAL  Final   Special Requests   Final    BOTTLES DRAWN AEROBIC AND ANAEROBIC Blood Culture results may not be optimal due to an inadequate volume of blood received in culture bottles   Culture   Final    NO GROWTH 3 DAYS Performed at [facility], [place] [address] [place], [place] [address]    Report Status PENDING  Incomplete     Discharge Instructions:   Discharge Instructions    Diet - low sodium heart healthy   Complete by: As directed    Diet Carb Modified   Complete by: As directed    Discharge instructions   Complete by: As directed    Complete the course of antibiotic.  Follow-up with your primary care physician in 1 week for blood work and general checkup.  Continue to take long deep breaths.  Increase your fluid  intake for 2 to 3 days.  Okay to take Tylenol for mild pain.   Increase activity slowly   Complete by: As directed      Allergies as of 05/07/2020      Reactions   Tramadol Other (See Comments)   "Made his head feel funny"      Medication List    STOP taking these medications   traMADol 50 MG tablet Commonly known as: Ultram     TAKE these medications   acetaminophen 500 MG  tablet Commonly known as: TYLENOL Take 500 mg by mouth every 6 (six) hours as needed for mild pain, fever or headache.   albuterol 108 (90 Base) MCG/ACT inhaler Commonly known as: VENTOLIN HFA Inhale 2 puffs into the lungs every 6 (six) hours as needed for wheezing or shortness of breath.   aspirin EC 81 MG tablet Take 81 mg by mouth at bedtime.   benzonatate 200 MG capsule Commonly known as: TESSALON Take 1 tab three times a day as needed for cough. What changed:   how much to take  how to take this  when to take this  reasons to take this  additional instructions   Breztri Aerosphere 160-9-4.8 MCG/ACT Aero Generic drug: Budeson-Glycopyrrol-Formoterol Inhale 1 puff into the lungs 2 (two) times daily.   cephALEXin 500 MG capsule Commonly known as: KEFLEX Take 1 capsule (500 mg total) by mouth every 12 (twelve) hours for 5 days.   Cinnamon 500 MG Tabs Take 1,000 mg by mouth daily.   doxazosin 8 MG tablet Commonly known as: CARDURA Take 1 tablet at Bedtime for BP & Prostate What changed:   how much to take  how to take this  when to take this  additional instructions   famotidine 40 MG tablet Commonly known as: PEPCID Take 1 tablet Daily for Indigestion & Heartburn What changed:   how much to take  how to take this  when to take this  additional instructions   Fish Oil 1200 MG Caps Take 1,200 mg by mouth daily.   glimepiride 4 MG tablet Commonly known as: AMARYL TAKE 1 TABLET BY MOUTH WITH BREAKFAST. IF FASTING GLUCOSE REMAINS ABOVE 150 IN THE MORNING TAKE ONE-HALF TABLET WITH DINNER. ONLY TAKE IF EATING. What changed:   how much to take  how to take this  when to take this  additional instructions   guaiFENesin-dextromethorphan 100-10 MG/5ML syrup Commonly known as: ROBITUSSIN DM Take 5 mLs by mouth every 4 (four) hours as needed for cough.   ipratropium-albuterol 0.5-2.5 (3) MG/3ML Soln Commonly known as: DUONEB Take 3 mLs by  nebulization every 6 (six) hours as needed (for wheezing or shortness of breath).   lisinopril 40 MG tablet Commonly known as: ZESTRIL Take 1 tablet Daily for BP & Diabetic Kidney Protection What changed:   how much to take  how to take this  when to take this  additional instructions   magnesium oxide 400 MG tablet Commonly known as: MAG-OX Take 400 mg by mouth daily.   ONE TOUCH ULTRA 2 w/Device Kit check blood sugar 1 time daily-E11.21   One-A-Day Mens 50+ Advantage Tabs Take 1 tablet by mouth daily with breakfast.   OneTouch Ultra test strip Generic drug: glucose blood Check blood sugasr 1 time daily-E11.21   onetouch ultrasoft lancets Check blood sugar 1 time daily-E11.21   pravastatin 40 MG tablet Commonly known as: PRAVACHOL TAKE 1 TABLET BY MOUTH AT BEDTIME FOR CHOLESTEROL What changed: See the new  instructions.   Vitamin D-3 25 MCG (1000 UT) Caps Take 1,000 Units by mouth at bedtime.       Time coordinating discharge: 39 minutes  Signed:  Janique Hoefer  [facility] 05/07/2020, 9:49 AM

## 2020-05-07 NOTE — Progress Notes (Signed)
DISCHARGE NOTE HOME Edwin Osborne to be discharged Home per MD order. Discussed prescriptions and follow up appointments with the patient. Prescriptions given to patient; medication list explained in detail. Patient verbalized understanding.  Skin clean, dry and intact without evidence of skin break down, no evidence of skin tears noted. IV catheter discontinued intact. Site without signs and symptoms of complications. Dressing and pressure applied. Pt denies pain at the site currently. No complaints noted.  Patient free of lines, drains, and wounds.   An After Visit Summary (AVS) was printed and given to the patient. Patient escorted via wheelchair, and discharged home via private auto.  Orville Govern, RN3

## 2020-05-07 NOTE — Care Management Important Message (Signed)
Important Message  Patient Details  Name: Edwin Osborne MRN: [id number] Date of Birth: [birth year]   Medicare Important Message Given:  Yes     [name] Circle 05/07/2020, 3:15 PM

## 2020-05-09 ENCOUNTER — Telehealth: Payer: Self-pay | Admitting: Internal Medicine

## 2020-05-09 ENCOUNTER — Telehealth: Payer: Self-pay | Admitting: *Deleted

## 2020-05-09 LAB — CULTURE, BLOOD (ROUTINE X 2)
Culture: NO GROWTH
Culture: NO GROWTH
Special Requests: ADEQUATE

## 2020-05-09 NOTE — Telephone Encounter (Signed)
I left a message for the patient to return my call.

## 2020-05-09 NOTE — Telephone Encounter (Signed)
Called patient on 05/09/2020 , 9:59 AM in an attempt to reach the patient for a hospital follow up. Spouse with the patient's spouse, with the patient in the room, who voinced no questions.  Admit date: 05/04/20 Discharge: 05/07/20   He does not have any questions or concerns about medications from the hospital admission. The patient's medications were reviewed over the phone, they were counseled to bring in all current medications to the hospital follow up visit.   I advised the patient to call if any questions or concerns arise about the hospital admission or medications. Per spouse, the patient is much better and continues to take Keflex 500 gm capsules twice a day.    Home health was not started in the hospital.  All questions were answered and a follow up appointment was made. Patient has an appontment 05/14/2020 with Judd Gaudier, NP.  Prior to Admission medications   Medication Sig Start Date End Date Taking? Authorizing Provider  acetaminophen (TYLENOL) 500 MG tablet Take 500 mg by mouth every 6 (six) hours as needed for mild pain, fever or headache.    [provider]  albuterol (VENTOLIN HFA) 108 (90 Base) MCG/ACT inhaler Inhale 2 puffs into the lungs every 6 (six) hours as needed for wheezing or shortness of breath.    [provider]  aspirin EC 81 MG tablet Take 81 mg by mouth at bedtime.     [provider]  benzonatate (TESSALON) 200 MG capsule Take 1 tab three times a day as needed for cough. Patient taking differently: Take 200 mg by mouth 3 (three) times daily as needed for cough.  04/26/20   Judd Gaudier, NP  Blood Glucose Monitoring Suppl (ONE TOUCH ULTRA 2) w/Device KIT check blood sugar 1 time daily-E11.21 11/25/19   Lucky Cowboy, MD  Budeson-Glycopyrrol-Formoterol (BREZTRI AEROSPHERE) 160-9-4.8 MCG/ACT AERO Inhale 1 puff into the lungs 2 (two) times daily. 04/26/20   Judd Gaudier, NP  cephALEXin (KEFLEX) 500 MG capsule Take 1 capsule (500 mg  total) by mouth every 12 (twelve) hours for 5 days. 05/07/20 05/12/20  Pokhrel, Rebekah [place], MD  Cholecalciferol (VITAMIN D-3) 25 MCG (1000 UT) CAPS Take 1,000 Units by mouth at bedtime.    [provider]  Cinnamon 500 MG TABS Take 1,000 mg by mouth daily.     [provider]  doxazosin (CARDURA) 8 MG tablet Take 1 tablet at Bedtime for BP & Prostate Patient taking differently: Take 8 mg by mouth at bedtime.  01/05/20   Lucky Cowboy, MD  famotidine (PEPCID) 40 MG tablet Take 1 tablet Daily for Indigestion & Heartburn Patient taking differently: Take 40 mg by mouth daily.  08/08/19   Lucky Cowboy, MD  glimepiride (AMARYL) 4 MG tablet TAKE 1 TABLET BY MOUTH WITH BREAKFAST. IF FASTING GLUCOSE REMAINS ABOVE 150 IN THE MORNING TAKE ONE-HALF TABLET WITH DINNER. ONLY TAKE IF EATING. Patient taking differently: Take 2-4 mg by mouth See admin instructions. Take 4 mg by mouth in the morning with breakfast and if fasting glucose remains above 150 in the morning, take 2 mg with dinner, and ONLY if eating 03/09/20   Judd Gaudier, NP  glucose blood (ONETOUCH ULTRA) test strip Check blood sugasr 1 time daily-E11.21 11/25/19   Lucky Cowboy, MD  guaiFENesin-dextromethorphan (ROBITUSSIN DM) 100-10 MG/5ML syrup Take 5 mLs by mouth every 4 (four) hours as needed for cough. 05/07/20   Pokhrel, L[place], MD  ipratropium-albuterol (DUONEB) 0.5-2.5 (3) MG/3ML SOLN Take 3 mLs by nebulization every 6 (six)  hours as needed (for wheezing or shortness of breath).    [provider]  Lancets Glory Rosebush ULTRASOFT) lancets Check blood sugar 1 time daily-E11.21 11/25/19   Unk Pinto, MD  lisinopril (ZESTRIL) 40 MG tablet Take 1 tablet Daily for BP & Diabetic Kidney Protection Patient taking differently: Take 40 mg by mouth at bedtime.  11/30/19   Unk Pinto, MD  magnesium oxide (MAG-OX) 400 MG tablet Take 400 mg by mouth daily.    [provider]  Multiple Vitamins-Minerals (ONE-A-DAY  MENS 50+ ADVANTAGE) TABS Take 1 tablet by mouth daily with breakfast.    [provider]  Omega-3 Fatty Acids (FISH OIL) 1200 MG CAPS Take 1,200 mg by mouth daily.    [provider]  pravastatin (PRAVACHOL) 40 MG tablet TAKE 1 TABLET BY MOUTH AT BEDTIME FOR CHOLESTEROL Patient taking differently: Take 40 mg by mouth at bedtime.  08/24/19   Unk Pinto, MD

## 2020-05-09 NOTE — Telephone Encounter (Signed)
Spouse called and  scheduled a POST HOSPITAL FOLLOW UP OFFICE VISIT

## 2020-05-11 NOTE — Discharge Summary (Signed)
Physician Discharge Summary  Edwin Osborne CHE:[id number] DOB: 1935/04/18 DOA: 04/20/2020  PCP: Unk Pinto, MD  Admit date: 04/20/2020 Discharge date: 05/11/2020  Admitted From: home Disposition:  AMA  Recommendations for Outpatient Follow-up:  1. Follow up with PCP in 1-2 weeks 2. Please obtain BMP/CBC in one week 3. Please follow up on the following pending results:  Discharge Condition: AMA Code Status:   Code Status: Prior Diet recommendation:  Diet Order    None       Brief/Interim Summary: See the admission note for more detail. Patient was admitted with right-sided rib and flank pain and pulmonary insufficiency needing oxygen, AKI on CKD. Plan was to admit him to management accordingly and further treatment however patient decided to leave [place] during the night. Night covering NP Blount was notified about patient's living arrest medical advice, patient left hospital with wife despite explaining risk by the staff at the time.  Discharge Diagnoses:  Active Problems:   Essential hypertension   Type 2 diabetes mellitus with stage 3 chronic kidney disease, without long-term current use of insulin (HCC)   Hyperlipidemia associated with type 2 diabetes mellitus ([facility])   Stage 3 chronic kidney disease due to type 2 diabetes mellitus (HCC)   Diabetic sensory polyneuropathy (HCC)   Class 1 obesity due to excess calories with body mass index (BMI) of 33.0 to 33.9 in adult   Bilateral chronic knee pain   Rib pain   AKI (acute kidney injury) ([place]) Pulmonary insufficiency needing oxygen Discharge Exam: Vitals:   04/20/20 1931 04/20/20 1936  BP: (!) 169/74   Pulse: 94   Resp: 20   Temp:    SpO2: 94% 94%   Patient was not examined by me  Discharge Instructions   Allergies as of 04/20/2020   No Known Allergies     Medication List    ASK your doctor about these medications   aspirin EC 81 MG tablet Take 81 mg by mouth at bedtime.   Cinnamon  500 MG Tabs Take 1,000 mg by mouth daily.   doxazosin 8 MG tablet Commonly known as: CARDURA Take 1 tablet at Bedtime for BP & Prostate   famotidine 40 MG tablet Commonly known as: PEPCID Take 1 tablet Daily for Indigestion & Heartburn   glimepiride 4 MG tablet Commonly known as: AMARYL TAKE 1 TABLET BY MOUTH WITH BREAKFAST. IF FASTING GLUCOSE REMAINS ABOVE 150 IN THE MORNING TAKE ONE-HALF TABLET WITH DINNER. ONLY TAKE IF EATING.   lisinopril 40 MG tablet Commonly known as: ZESTRIL Take 1 tablet Daily for BP & Diabetic Kidney Protection   magnesium oxide 400 MG tablet Commonly known as: MAG-OX Take 400 mg by mouth daily.   ONE TOUCH ULTRA 2 w/Device Kit check blood sugar 1 time daily-E11.21   OneTouch Ultra test strip Generic drug: glucose blood Check blood sugasr 1 time daily-E11.21   onetouch ultrasoft lancets Check blood sugar 1 time daily-E11.21   pravastatin 40 MG tablet Commonly known as: PRAVACHOL TAKE 1 TABLET BY MOUTH AT BEDTIME FOR CHOLESTEROL       Follow-up Information    Unk Pinto, MD. Schedule an appointment as soon as possible for a visit in 2 days.   Specialty: Internal Medicine Contact information: [address] [place] [address] [phone]              Allergies  Allergen Reactions  . Tramadol Other (See Comments)    "Made his head feel funny"  The results of significant diagnostics from this hospitalization (including imaging, microbiology, ancillary and laboratory) are listed below for reference.    Microbiology: Recent Results (from the past 240 hour(s))  Urine culture     Status: Abnormal   Collection Time: 05/04/20  2:13 PM   Specimen: In/Out Cath Urine  Result Value Ref Range Status   Specimen Description IN/OUT CATH URINE  Final   Special Requests   Final    NONE Performed at [facility], [address], [place], [place] [address]    Culture 70,000 COLONIES/mL KLEBSIELLA PNEUMONIAE  (A)  Final   Report Status 05/06/2020 FINAL  Final   Organism ID, Bacteria KLEBSIELLA PNEUMONIAE (A)  Final      Susceptibility   Klebsiella pneumoniae - MIC*    AMPICILLIN >=32 RESISTANT Resistant     CEFAZOLIN <=4 SENSITIVE Sensitive     CEFTRIAXONE <=0.25 SENSITIVE Sensitive     CIPROFLOXACIN <=0.25 SENSITIVE Sensitive     GENTAMICIN <=1 SENSITIVE Sensitive     IMIPENEM <=0.25 SENSITIVE Sensitive     NITROFURANTOIN 64 INTERMEDIATE Intermediate     TRIMETH/SULFA <=20 SENSITIVE Sensitive     AMPICILLIN/SULBACTAM 8 SENSITIVE Sensitive     PIP/TAZO <=4 SENSITIVE Sensitive     * 70,000 COLONIES/mL KLEBSIELLA PNEUMONIAE  SARS Coronavirus 2 by RT PCR (hospital order, performed in [facility] hospital lab) Nasopharyngeal Nasopharyngeal Swab     Status: None   Collection Time: 05/04/20  2:23 PM   Specimen: Nasopharyngeal Swab  Result Value Ref Range Status   SARS Coronavirus 2 NEGATIVE NEGATIVE Final    Comment: (NOTE) SARS-CoV-2 target nucleic acids are NOT DETECTED.  The SARS-CoV-2 RNA is generally detectable in upper and lower respiratory specimens during the acute phase of infection. The lowest concentration of SARS-CoV-2 viral copies this assay can detect is 250 copies / mL. A negative result does not preclude SARS-CoV-2 infection and should not be used as the sole basis for treatment or other patient management decisions.  A negative result may occur with improper specimen collection / handling, submission of specimen other than nasopharyngeal swab, presence of viral mutation(s) within the areas targeted by this assay, and inadequate number of viral copies (<250 copies / mL). A negative result must be combined with clinical observations, patient history, and epidemiological information.  Fact Sheet for Patients:   StrictlyIdeas.no  Fact Sheet for Healthcare Providers: BankingDealers.co.za  This test is not yet approved or   cleared by the [place] FDA and has been authorized for detection and/or diagnosis of SARS-CoV-2 by FDA under an Emergency Use Authorization (EUA).  This EUA will remain in effect (meaning this test can be used) for the duration of the COVID-19 declaration under Section 564(b)(1) of the Act, 21 U.S.C. section 360bbb-3(b)(1), unless the authorization is terminated or revoked sooner.  Performed at [facility], [place] [address], [place], [place] [address]   Blood Culture (routine x 2)     Status: None   Collection Time: 05/04/20  2:41 PM   Specimen: BLOOD  Result Value Ref Range Status   Specimen Description BLOOD RIGHT ANTECUBITAL  Final   Special Requests   Final    BOTTLES DRAWN AEROBIC AND ANAEROBIC Blood Culture adequate volume   Culture   Final    NO GROWTH 5 DAYS Performed at [facility], [place] [address] [place], [place] [address]    Report Status 05/09/2020 FINAL  Final  Blood Culture (routine x 2)  Status: None   Collection Time: 05/04/20  2:46 PM   Specimen: BLOOD  Result Value Ref Range Status   Specimen Description BLOOD LEFT ANTECUBITAL  Final   Special Requests   Final    BOTTLES DRAWN AEROBIC AND ANAEROBIC Blood Culture results may not be optimal due to an inadequate volume of blood received in culture bottles   Culture   Final    NO GROWTH 5 DAYS Performed at [facility], [place] [address] [place], [place] [address]    Report Status 05/09/2020 FINAL  Final    Procedures/Studies: CT ABDOMEN PELVIS WO CONTRAST  Result Date: 04/20/2020 CLINICAL DATA:  Pain following recent fall EXAM: CT CHEST, ABDOMEN AND PELVIS WITHOUT CONTRAST TECHNIQUE: Multidetector CT imaging of the chest, abdomen and pelvis was performed following the standard protocol without IV contrast. COMPARISON:  CT abdomen and pelvis October 21, 2016 FINDINGS: CT CHEST FINDINGS Cardiovascular: There is no evident fluid or apparent hemorrhage in the periaortic/mediastinal region.  There is no thoracic aortic aneurysm. There are scattered foci of calcification in visualized great vessels. There is aortic atherosclerosis. There are foci of coronary artery calcification. There is no appreciable pericardial effusion or pericardial thickening. Mediastinum/Nodes: Thyroid appears normal. There is no appreciable thoracic adenopathy. No esophageal lesions are evident. Lungs/Pleura: There is no appreciable pneumothorax. There are areas of patchy atelectatic change, primarily in the lung bases. There are occasional calcified granulomas. There is no appreciable edema or airspace opacity to suggest pneumonia. There are multiple foci of pleural calcification. No appreciable pleural effusions. Musculoskeletal: There is degenerative change in the thoracic spine with a degree of diffuse idiopathic skeletal hyperostosis. There is arthropathy with vacuum phenomenon in each sternoclavicular joint. There is no demonstrable fracture or dislocation. No blastic or lytic bone lesions. No evident chest wall lesions. CT ABDOMEN PELVIS FINDINGS Hepatobiliary: No perihepatic fluid noted. No focal liver lesions are evident on this noncontrast enhanced study. No overt liver laceration or rupture noted. Gallbladder is absent. There is no biliary duct dilatation. Pancreas: No pancreatic mass or inflammatory focus. No peripancreatic fluid. Spleen: No perisplenic fluid. No abnormal attenuation in the spleen to suggest potential laceration or rupture on noncontrast enhanced study. No splenic lesions evident. Adrenals/Urinary Tract: Adrenals bilaterally appear normal. There is a cyst in the lower pole of the left kidney measuring 2.7 x 2.4 cm. There is no appreciable hydronephrosis on either side. Perinephric thickening bilaterally is stable compared to the 2018 study. There is not felt to be perinephric fluid on either side currently. There is no evident renal or ureteral calculus on either side. Urinary bladder is midline with  wall thickness within normal limits. Stomach/Bowel: There is no appreciable bowel wall or mesenteric thickening. There is no evident bowel obstruction. Terminal ileum appears normal. No evident free air or portal venous air. Vascular/Lymphatic: No abdominal aortic aneurysm. There is aortic and common iliac artery atherosclerosis. No periaortic fluid. No adenopathy appreciable in the abdomen or pelvis. Reproductive: Prostate is prominent, similar in appearance to prior study. Prostate abuts and impresses upon the inferior aspect of the urinary bladder. Seminal vesicles appear normal bilaterally. Other: Appendix appears normal. No abscess or ascites in the abdomen or pelvis. No loculated fluid collections evident in the abdomen or pelvis. Musculoskeletal: No appreciable fracture or dislocation. There is degenerative change in the lumbar spine. No blastic or lytic bone lesions. There is no intramuscular or abdominal wall lesions. IMPRESSION: Chest CT: 1. No pneumothorax. Areas of atelectatic change bilaterally. Areas of  pleural calcification may represent residua from asbestos exposure. Occasional lung granulomas noted. No edema or consolidation. 2. Aortic atherosclerosis. Foci of great vessel and coronary artery calcification noted. No mediastinal hemorrhage or fluid noted. Note that evaluation of the aorta is limited without intravenous contrast. 3.  No evident adenopathy. CT abdomen and pelvis: 1. No acute traumatic appearing lesion evident on this noncontrast enhanced study. 2. No bowel wall thickening or bowel obstruction. No abscess in the abdomen or pelvis. Appendix appears normal. 3. Prominence of the prostate is stable. Prostate abuts the inferior either. 4.  Gallbladder absent. 5.  Aortic Atherosclerosis (ICD10-I70.0). Electronically Signed   By: Lowella Grip III M.D.   On: 04/20/2020 06:40   DG Chest 2 View  Result Date: 05/05/2020 CLINICAL DATA:  Hypoxia EXAM: CHEST - 2 VIEW COMPARISON:  05/04/2020  FINDINGS: Cardiac shadow is mildly enlarged but stable. Aortic calcifications are seen. The overall inspiratory effort is poor but the lungs remain clear. No bony abnormality is seen. IMPRESSION: No acute abnormality noted. Electronically Signed   By: [name] M.D.   On: 05/05/2020 08:21   DG Chest 2 View  Result Date: 04/19/2020 CLINICAL DATA:  Fall EXAM: CHEST - 2 VIEW COMPARISON:  01/16/2014 FINDINGS: Linear scarring or atelectasis at the left base. No acute consolidation or effusion. Normal heart size. No pneumothorax. IMPRESSION: No active cardiopulmonary disease. Electronically Signed   By: Donavan Foil M.D.   On: 04/19/2020 19:32   CT Chest Wo Contrast  Result Date: 04/20/2020 CLINICAL DATA:  Pain following recent fall EXAM: CT CHEST, ABDOMEN AND PELVIS WITHOUT CONTRAST TECHNIQUE: Multidetector CT imaging of the chest, abdomen and pelvis was performed following the standard protocol without IV contrast. COMPARISON:  CT abdomen and pelvis October 21, 2016 FINDINGS: CT CHEST FINDINGS Cardiovascular: There is no evident fluid or apparent hemorrhage in the periaortic/mediastinal region. There is no thoracic aortic aneurysm. There are scattered foci of calcification in visualized great vessels. There is aortic atherosclerosis. There are foci of coronary artery calcification. There is no appreciable pericardial effusion or pericardial thickening. Mediastinum/Nodes: Thyroid appears normal. There is no appreciable thoracic adenopathy. No esophageal lesions are evident. Lungs/Pleura: There is no appreciable pneumothorax. There are areas of patchy atelectatic change, primarily in the lung bases. There are occasional calcified granulomas. There is no appreciable edema or airspace opacity to suggest pneumonia. There are multiple foci of pleural calcification. No appreciable pleural effusions. Musculoskeletal: There is degenerative change in the thoracic spine with a degree of diffuse idiopathic skeletal  hyperostosis. There is arthropathy with vacuum phenomenon in each sternoclavicular joint. There is no demonstrable fracture or dislocation. No blastic or lytic bone lesions. No evident chest wall lesions. CT ABDOMEN PELVIS FINDINGS Hepatobiliary: No perihepatic fluid noted. No focal liver lesions are evident on this noncontrast enhanced study. No overt liver laceration or rupture noted. Gallbladder is absent. There is no biliary duct dilatation. Pancreas: No pancreatic mass or inflammatory focus. No peripancreatic fluid. Spleen: No perisplenic fluid. No abnormal attenuation in the spleen to suggest potential laceration or rupture on noncontrast enhanced study. No splenic lesions evident. Adrenals/Urinary Tract: Adrenals bilaterally appear normal. There is a cyst in the lower pole of the left kidney measuring 2.7 x 2.4 cm. There is no appreciable hydronephrosis on either side. Perinephric thickening bilaterally is stable compared to the 2018 study. There is not felt to be perinephric fluid on either side currently. There is no evident renal or ureteral calculus on either side. Urinary bladder is midline  with wall thickness within normal limits. Stomach/Bowel: There is no appreciable bowel wall or mesenteric thickening. There is no evident bowel obstruction. Terminal ileum appears normal. No evident free air or portal venous air. Vascular/Lymphatic: No abdominal aortic aneurysm. There is aortic and common iliac artery atherosclerosis. No periaortic fluid. No adenopathy appreciable in the abdomen or pelvis. Reproductive: Prostate is prominent, similar in appearance to prior study. Prostate abuts and impresses upon the inferior aspect of the urinary bladder. Seminal vesicles appear normal bilaterally. Other: Appendix appears normal. No abscess or ascites in the abdomen or pelvis. No loculated fluid collections evident in the abdomen or pelvis. Musculoskeletal: No appreciable fracture or dislocation. There is  degenerative change in the lumbar spine. No blastic or lytic bone lesions. There is no intramuscular or abdominal wall lesions. IMPRESSION: Chest CT: 1. No pneumothorax. Areas of atelectatic change bilaterally. Areas of pleural calcification may represent residua from asbestos exposure. Occasional lung granulomas noted. No edema or consolidation. 2. Aortic atherosclerosis. Foci of great vessel and coronary artery calcification noted. No mediastinal hemorrhage or fluid noted. Note that evaluation of the aorta is limited without intravenous contrast. 3.  No evident adenopathy. CT abdomen and pelvis: 1. No acute traumatic appearing lesion evident on this noncontrast enhanced study. 2. No bowel wall thickening or bowel obstruction. No abscess in the abdomen or pelvis. Appendix appears normal. 3. Prominence of the prostate is stable. Prostate abuts the inferior either. 4.  Gallbladder absent. 5.  Aortic Atherosclerosis (ICD10-I70.0). Electronically Signed   By: Lowella Grip III M.D.   On: 04/20/2020 06:40   US RENAL  Result Date: 05/04/2020 CLINICAL DATA:  Sepsis, UTI EXAM: RENAL / URINARY TRACT ULTRASOUND COMPLETE COMPARISON:  CT 04/20/2020 FINDINGS: Right Kidney: Renal measurements: 10.4 x 6.0 x 6.6 cm = volume: 217 mL . Echogenicity within normal limits. No mass or hydronephrosis visualized. Left Kidney: Renal measurements: 11.9 x 6.3 x 5.1 cm = volume: 200 mL. 2.5 cm lower pole cyst. Normal echotexture. No hydronephrosis. Bladder: Decompressed, not visualized Other: None. IMPRESSION: No acute findings.  No hydronephrosis. Electronically Signed   By: [name] M.D.   On: 05/04/2020 17:31   DG Chest Port 1 View  Result Date: 05/04/2020 CLINICAL DATA:  Fever since yesterday. Hematuria. History of hypertension. EXAM: PORTABLE CHEST 1 VIEW COMPARISON:  04/19/2020. FINDINGS: Cardiac silhouette is normal in size. No mediastinal or hilar masses. Mild linear opacities at the left lung base consistent with  scarring and/or atelectasis. Lungs otherwise clear. No pleural effusion or pneumothorax. Skeletal structures are demineralized but grossly intact. IMPRESSION: No active disease. Electronically Signed   By: [name] M.D.   On: 05/04/2020 14:38   DG Humerus Right  Result Date: 04/20/2020 CLINICAL DATA:  Recent fall with arm pain, initial encounter EXAM: RIGHT HUMERUS - 2+ VIEW COMPARISON:  None. FINDINGS: There is no evidence of fracture or other focal bone lesions. Soft tissues are unremarkable. IMPRESSION: No acute fracture noted. Electronically Signed   By: [name] M.D.   On: 04/20/2020 03:40     Labs: BNP (last 3 results) Recent Labs    04/26/20 1548  BNP 7   Basic Metabolic Panel: Recent Labs  Lab 05/05/20 0457 05/06/20 0359 05/07/20 0324  NA 138 136 135  K 4.9 4.5 4.6  CL 103 103 101  CO2 $Re'26 24 25  'KgN$ GLUCOSE 119* 166* 200*  BUN 21 25* 36*  CREATININE 2.21* 1.85* 2.01*  CALCIUM 8.7* 8.8* 8.7*  MG  --  1.9  1.9   Liver Function Tests: No results for input(s): AST, ALT, ALKPHOS, BILITOT, PROT, ALBUMIN in the last 168 hours. No results for input(s): LIPASE, AMYLASE in the last 168 hours. No results for input(s): AMMONIA in the last 168 hours. CBC: Recent Labs  Lab 05/05/20 0457 05/06/20 0359 05/07/20 0324  WBC 23.9* 19.6* 12.9*  HGB 12.5* 11.9* 12.3*  HCT 38.7* 35.6* 37.0*  MCV 90.0 89.0 88.9  PLT 181 164 190   Cardiac Enzymes: No results for input(s): CKTOTAL, CKMB, CKMBINDEX, TROPONINI in the last 168 hours. BNP: Invalid input(s): POCBNP CBG: Recent Labs  Lab 05/06/20 0703 05/06/20 1113 05/06/20 1606 05/06/20 2116 05/07/20 0630  GLUCAP 140* 180* 241* 217* 176*   D-Dimer No results for input(s): DDIMER in the last 72 hours. Hgb A1c No results for input(s): HGBA1C in the last 72 hours. Lipid Profile No results for input(s): CHOL, HDL, LDLCALC, TRIG, CHOLHDL, LDLDIRECT in the last 72 hours. Thyroid function studies No results for input(s):  TSH, T4TOTAL, T3FREE, THYROIDAB in the last 72 hours.  Invalid input(s): FREET3 Anemia work up No results for input(s): VITAMINB12, FOLATE, FERRITIN, TIBC, IRON, RETICCTPCT in the last 72 hours. Urinalysis    Component Value Date/Time   COLORURINE YELLOW 05/04/2020 1409   APPEARANCEUR HAZY (A) 05/04/2020 1409   LABSPEC 1.020 05/04/2020 1409   PHURINE 5.0 05/04/2020 1409   GLUCOSEU NEGATIVE 05/04/2020 1409   HGBUR SMALL (A) 05/04/2020 1409   BILIRUBINUR NEGATIVE 05/04/2020 1409   KETONESUR NEGATIVE 05/04/2020 1409   PROTEINUR NEGATIVE 05/04/2020 1409   NITRITE NEGATIVE 05/04/2020 1409   LEUKOCYTESUR MODERATE (A) 05/04/2020 1409   Sepsis Labs Invalid input(s): PROCALCITONIN,  WBC,  LACTICIDVEN Microbiology Recent Results (from the past 240 hour(s))  Urine culture     Status: Abnormal   Collection Time: 05/04/20  2:13 PM   Specimen: In/Out Cath Urine  Result Value Ref Range Status   Specimen Description IN/OUT CATH URINE  Final   Special Requests   Final    NONE Performed at [facility], [place] [address], [place], [place] [address]    Culture 70,000 COLONIES/mL KLEBSIELLA PNEUMONIAE (A)  Final   Report Status 05/06/2020 FINAL  Final   Organism ID, Bacteria KLEBSIELLA PNEUMONIAE (A)  Final      Susceptibility   Klebsiella pneumoniae - MIC*    AMPICILLIN >=32 RESISTANT Resistant     CEFAZOLIN <=4 SENSITIVE Sensitive     CEFTRIAXONE <=0.25 SENSITIVE Sensitive     CIPROFLOXACIN <=0.25 SENSITIVE Sensitive     GENTAMICIN <=1 SENSITIVE Sensitive     IMIPENEM <=0.25 SENSITIVE Sensitive     NITROFURANTOIN 64 INTERMEDIATE Intermediate     TRIMETH/SULFA <=20 SENSITIVE Sensitive     AMPICILLIN/SULBACTAM 8 SENSITIVE Sensitive     PIP/TAZO <=4 SENSITIVE Sensitive     * 70,000 COLONIES/mL KLEBSIELLA PNEUMONIAE  SARS Coronavirus 2 by RT PCR (hospital order, performed in [facility] hospital lab) Nasopharyngeal Nasopharyngeal Swab     Status: None   Collection Time: 05/04/20  2:23  PM   Specimen: Nasopharyngeal Swab  Result Value Ref Range Status   SARS Coronavirus 2 NEGATIVE NEGATIVE Final    Comment: (NOTE) SARS-CoV-2 target nucleic acids are NOT DETECTED.  The SARS-CoV-2 RNA is generally detectable in upper and lower respiratory specimens during the acute phase of infection. The lowest concentration of SARS-CoV-2 viral copies this assay can detect is 250 copies / mL. A negative result does not preclude SARS-CoV-2 infection and should not be used  as the sole basis for treatment or other patient management decisions.  A negative result may occur with improper specimen collection / handling, submission of specimen other than nasopharyngeal swab, presence of viral mutation(s) within the areas targeted by this assay, and inadequate number of viral copies (<250 copies / mL). A negative result must be combined with clinical observations, patient history, and epidemiological information.  Fact Sheet for Patients:   StrictlyIdeas.no  Fact Sheet for Healthcare Providers: BankingDealers.co.za  This test is not yet approved or  cleared by the [place] FDA and has been authorized for detection and/or diagnosis of SARS-CoV-2 by FDA under an Emergency Use Authorization (EUA).  This EUA will remain in effect (meaning this test can be used) for the duration of the COVID-19 declaration under Section 564(b)(1) of the Act, 21 U.S.C. section 360bbb-3(b)(1), unless the authorization is terminated or revoked sooner.  Performed at [facility], [place] [address] [place], [place] [address]   Blood Culture (routine x 2)     Status: None   Collection Time: 05/04/20  2:41 PM   Specimen: BLOOD  Result Value Ref Range Status   Specimen Description BLOOD RIGHT ANTECUBITAL  Final   Special Requests   Final    BOTTLES DRAWN AEROBIC AND ANAEROBIC Blood Culture adequate volume   Culture   Final    NO GROWTH 5 DAYS Performed at  [facility], [place] [address] [place], [place] [address]    Report Status 05/09/2020 FINAL  Final  Blood Culture (routine x 2)     Status: None   Collection Time: 05/04/20  2:46 PM   Specimen: BLOOD  Result Value Ref Range Status   Specimen Description BLOOD LEFT ANTECUBITAL  Final   Special Requests   Final    BOTTLES DRAWN AEROBIC AND ANAEROBIC Blood Culture results may not be optimal due to an inadequate volume of blood received in culture bottles   Culture   Final    NO GROWTH 5 DAYS Performed at [facility], [place] [address], [place], [place] [address]    Report Status 05/09/2020 FINAL  Final     Time coordinating discharge: 0 minutes  SIGNED: Antonieta Pert, MD  [facility] 05/11/2020, 3:02 PM  If 7PM-7AM, please contact night-coverage [email or web address]

## 2020-05-13 NOTE — Progress Notes (Signed)
Hospital follow up  Assessment and Plan: Hospital visit follow up for Sepsis due to UTI.  Edwin Osborne was seen today for hospitalization follow-up.  Diagnoses and all orders for this visit:  Sepsis secondary to UTI / Klebsiella (Edwin Osborne) -     CBC with Differential/Platelet      -      UA Increase water intake Continue to monitor  Hypoxia Improved Room air, 97% in office  Essential hypertension Continue current medications: Lisinopril 32m Monitor blood pressure at home; call if consistently over 130/80 Continue DASH diet.   Reminder to go to the ER if any CP, SOB, nausea, dizziness, severe HA, changes vision/speech, left arm numbness and tingling and jaw pain.  -     CBC with Differential/Platelet -     COMPLETE METABOLIC PANEL WITH GFR -     Magnesium  Hyperlipidemia associated with type 2 diabetes mellitus (HCC) Continue medications: pravastatin 434mDiscussed dietary and exercise modifications Low fat diet   Stage 3 chronic kidney disease due to type 2 diabetes mellitus (HCC) Increase fluids  Avoid NSAIDS Blood pressure control Monitor sugars  Will continue to monitor  Rib pain -     methocarbamol (ROBAXIN) 500 MG tablet; Take 1 tablet (500 mg total) by mouth every 8 (eight) hours as needed for muscle spasms. Take one tablet one hour prior to going to bed. Use topical Biofreeze, do not use heat immediately after application. May use heat  Cough Continue OTC cough syrup PRN Continue incentive spirometer Increase water intake    All medications were reviewed with patient and family and fully reconciled. All questions answered fully, and patient and family members were encouraged to call the office with any further questions or concerns. Discussed goal to avoid readmission related to this diagnosis.    Over 40 minutes of face to face exam, counseling, chart review, and complex, high/moderate level critical decision making was performed this visit.   Future  Appointments  Date Time Provider [place]10/01/2020  9:00 AM Edwin Osborne Edwin Osborne [facility] None  09/20/2020  9:00 AM Edwin Osborne [facility] None     HPI [id number] presents for follow up for transition from recent hospitalization. Admit date to the hospital was 05/04/20, patient was discharged from the hospital on 05/07/20 and our clinical staff contacted the office the day after discharge to set up a follow up appointment. The discharge summary, medications, and diagnostic test results were reviewed before meeting with the patient. The patient was admitted for: Sepsis due to UTI  Patient was seen in our office by Edwin Osborne on 04/19/20 s/p fall and was sent for x-rays and given medication to help with pain management.  He had ER evaluation that night around 2am.   He was tachypnic and 88% on room air at rest.  He was seen by urgent care two days later and prescribed antibiotics. He was then seen in our office on 04/26/20 for new onset bilateral ankle edema.   BNP was 7 at that time.  He continued to have cough and rib pain.  He completed azithromycin and was given tessalon pearls and inhaler.  05/04/20 he had admission to hospital for sepsis due to UTI.  He was given IV rocephin and blood cultures.  He is currently taking Keflex and finished last dose one day ago.  Edwin Osborne is concerned about the infection being completley gone.  He is asymptomatic at this time.  He continues to have right side pain, that increases when  he sleeps at night.  Reports when he first wakes it is painful but after he gets moving for the day it does improve.  He has not been taking anything for this.  Reports he continues to have a mild cough and take OTC cough medication for this which is helping.  He only takes this at night.  Reports he has not been waking up coughing so it has improved.  It is productive intermittently. Reports his activity is not back to baseline.  He tends to tire out quickly but working to  improve this.  He reports using the incentive spirometer at home at least 4-5 times a day.  Home health is not involved.   Images while in the hospital: DG Chest 2 View  Result Date: 05/05/2020 CLINICAL DATA:  Hypoxia EXAM: CHEST - 2 VIEW COMPARISON:  05/04/2020 FINDINGS: Cardiac shadow is mildly enlarged but stable. Aortic calcifications are seen. The overall inspiratory effort is poor but the lungs remain clear. No bony abnormality is seen. IMPRESSION: No acute abnormality noted. Electronically Signed   By: Edwin Osborne M.D.   On: 05/05/2020 08:21   US RENAL  Result Date: 05/04/2020 CLINICAL DATA:  Sepsis, UTI EXAM: RENAL / URINARY TRACT ULTRASOUND COMPLETE COMPARISON:  CT 04/20/2020 FINDINGS: Right Kidney: Renal measurements: 10.4 x 6.0 x 6.6 cm = volume: 217 mL . Echogenicity within normal limits. No mass or hydronephrosis visualized. Left Kidney: Renal measurements: 11.9 x 6.3 x 5.1 cm = volume: 200 mL. 2.5 cm lower pole cyst. Normal echotexture. No hydronephrosis. Bladder: Decompressed, not visualized Other: None. IMPRESSION: No acute findings.  No hydronephrosis. Electronically Signed   By: Edwin Osborne M.D.   On: 05/04/2020 17:31   DG Chest Port 1 View  Result Date: 05/04/2020 CLINICAL DATA:  Fever since yesterday. Hematuria. History of hypertension. EXAM: PORTABLE CHEST 1 VIEW COMPARISON:  04/19/2020. FINDINGS: Cardiac silhouette is normal in size. No mediastinal or hilar masses. Mild linear opacities at the left lung base consistent with scarring and/or atelectasis. Lungs otherwise clear. No pleural effusion or pneumothorax. Skeletal structures are demineralized but grossly intact. IMPRESSION: No active disease. Electronically Signed   By: Edwin Osborne M.D.   On: 05/04/2020 14:38     Current Outpatient Medications (Endocrine & Metabolic):  .  glimepiride (AMARYL) 4 MG tablet, TAKE 1 TABLET BY MOUTH WITH BREAKFAST. IF FASTING GLUCOSE REMAINS ABOVE 150 IN THE MORNING TAKE ONE-HALF TABLET  WITH DINNER. ONLY TAKE IF EATING. (Patient taking differently: Take 2-4 mg by mouth See admin instructions. Take 4 mg by mouth in the morning with breakfast and if fasting glucose remains above 150 in the morning, take 2 mg with dinner, and ONLY if eating)  Current Outpatient Medications (Cardiovascular):  .  doxazosin (CARDURA) 8 MG tablet, Take 1 tablet at Bedtime for BP & Prostate (Patient taking differently: Take 8 mg by mouth at bedtime. ) .  lisinopril (ZESTRIL) 40 MG tablet, Take 1 tablet Daily for BP & Diabetic Kidney Protection (Patient taking differently: Take 40 mg by mouth at bedtime. ) .  pravastatin (PRAVACHOL) 40 MG tablet, TAKE 1 TABLET BY MOUTH AT BEDTIME FOR CHOLESTEROL (Patient taking differently: Take 40 mg by mouth at bedtime. )  Current Outpatient Medications (Respiratory):  .  albuterol (VENTOLIN HFA) 108 (90 Base) MCG/ACT inhaler, Inhale 2 puffs into the lungs every 6 (six) hours as needed for wheezing or shortness of breath. .  benzonatate (TESSALON) 200 MG capsule, Take 1 tab three times a day as  needed for cough. (Patient taking differently: Take 200 mg by mouth 3 (three) times daily as needed for cough. ) .  Budeson-Glycopyrrol-Formoterol (BREZTRI AEROSPHERE) 160-9-4.8 MCG/ACT AERO, Inhale 1 puff into the lungs 2 (two) times daily. Marland Kitchen  guaiFENesin-dextromethorphan (ROBITUSSIN DM) 100-10 MG/5ML syrup, Take 5 mLs by mouth every 4 (four) hours as needed for cough. Marland Kitchen  ipratropium-albuterol (DUONEB) 0.5-2.5 (3) MG/3ML SOLN, Take 3 mLs by nebulization every 6 (six) hours as needed (for wheezing or shortness of breath).  Current Outpatient Medications (Analgesics):  .  acetaminophen (TYLENOL) 500 MG tablet, Take 500 mg by mouth every 6 (six) hours as needed for mild pain, fever or headache. Marland Kitchen  aspirin EC 81 MG tablet, Take 81 mg by mouth at bedtime.    Current Outpatient Medications (Other):  .  Blood Glucose Monitoring Suppl (ONE TOUCH ULTRA 2) w/Device KIT, check blood sugar  1 time daily-E11.21 .  Cholecalciferol (VITAMIN D-3) 25 MCG (1000 UT) CAPS, Take 1,000 Units by mouth at bedtime. .  Cinnamon 500 MG TABS, Take 1,000 mg by mouth daily.  .  famotidine (PEPCID) 40 MG tablet, Take 1 tablet Daily for Indigestion & Heartburn (Patient taking differently: Take 40 mg by mouth daily. ) .  glucose blood (ONETOUCH ULTRA) test strip, Check blood sugasr 1 time daily-E11.21 .  Lancets (ONETOUCH ULTRASOFT) lancets, Check blood sugar 1 time daily-E11.21 .  magnesium oxide (MAG-OX) 400 MG tablet, Take 400 mg by mouth daily. .  methocarbamol (ROBAXIN) 500 MG tablet, Take 1 tablet (500 mg total) by mouth every 8 (eight) hours as needed for muscle spasms. Take one tablet one hour prior to going to bed. .  Multiple Vitamins-Minerals (ONE-A-DAY MENS 50+ ADVANTAGE) TABS, Take 1 tablet by mouth daily with breakfast. .  Omega-3 Fatty Acids (FISH OIL) 1200 MG CAPS, Take 1,200 mg by mouth daily.  Past Medical History:  Diagnosis Date  . Arthritis   . BPH (benign prostatic hyperplasia)   . Cataract   . CKD (chronic kidney disease), stage III   . Depression    "usually a happy go lucky"  . Diabetes mellitus   . Diverticulosis   . GERD (gastroesophageal reflux disease)    sometimes will take omeprazole  . Hyperlipidemia   . Hypertension      Allergies  Allergen Reactions  . Tramadol Other (See Comments)    "Made his head feel funny"    ROS: all negative except above.   Physical Exam: Filed Weights   05/14/20 1000  Weight: 229 lb (103.9 kg)   BP 110/62   Pulse 86   Temp (!) 97.3 F (36.3 C)   Wt 229 lb (103.9 kg)   SpO2 97%   BMI 32.86 kg/m  General Appearance: Well nourished, in no apparent distress. Eyes: PERRLA, EOMs, conjunctiva no swelling or erythema Sinuses: No Frontal/maxillary tenderness ENT/Mouth: Ext aud canals clear, TMs without erythema, bulging. No erythema, swelling, or exudate on post pharynx.  Tonsils not swollen or erythematous. Hearing normal.   Neck: Supple, thyroid normal.  Respiratory: Respiratory effort normal, BS equal bilaterally without rales, rhonchi, wheezing or stridor.  Cardio: RRR with no MRGs. Brisk peripheral pulses without edema.  Abdomen: Soft, obese, + BS.  Non tender, no guarding, rebound, hernias, masses. Lymphatics: Non tender without lymphadenopathy.  Musculoskeletal: Full ROM, 5/5 strength, normal gait.  Skin: Warm, dry without rashes, lesions, ecchymosis.  Neuro: Cranial nerves intact. Normal muscle tone, no cerebellar symptoms. Sensation intact.  Psych: Awake and oriented X 3, normal  affect, Insight and Judgment appropriate.    Garnet Sierras, Laqueta Jean, DNP [facility] 05/14/2020  2:30 PM

## 2020-05-14 ENCOUNTER — Encounter: Payer: Self-pay | Admitting: Adult Health Nurse Practitioner

## 2020-05-14 ENCOUNTER — Other Ambulatory Visit: Payer: Self-pay

## 2020-05-14 ENCOUNTER — Ambulatory Visit (INDEPENDENT_AMBULATORY_CARE_PROVIDER_SITE_OTHER): Payer: Medicare Other | Admitting: Adult Health Nurse Practitioner

## 2020-05-14 VITALS — BP 110/62 | HR 86 | Temp 97.3°F | Wt 229.0 lb

## 2020-05-14 DIAGNOSIS — E1169 Type 2 diabetes mellitus with other specified complication: Secondary | ICD-10-CM

## 2020-05-14 DIAGNOSIS — N390 Urinary tract infection, site not specified: Secondary | ICD-10-CM

## 2020-05-14 DIAGNOSIS — R05 Cough: Secondary | ICD-10-CM

## 2020-05-14 DIAGNOSIS — E1122 Type 2 diabetes mellitus with diabetic chronic kidney disease: Secondary | ICD-10-CM

## 2020-05-14 DIAGNOSIS — I10 Essential (primary) hypertension: Secondary | ICD-10-CM

## 2020-05-14 DIAGNOSIS — A419 Sepsis, unspecified organism: Secondary | ICD-10-CM

## 2020-05-14 DIAGNOSIS — R0902 Hypoxemia: Secondary | ICD-10-CM

## 2020-05-14 DIAGNOSIS — R0781 Pleurodynia: Secondary | ICD-10-CM

## 2020-05-14 DIAGNOSIS — A414 Sepsis due to anaerobes: Secondary | ICD-10-CM

## 2020-05-14 DIAGNOSIS — N183 Chronic kidney disease, stage 3 unspecified: Secondary | ICD-10-CM

## 2020-05-14 DIAGNOSIS — A4159 Other Gram-negative sepsis: Secondary | ICD-10-CM

## 2020-05-14 DIAGNOSIS — R059 Cough, unspecified: Secondary | ICD-10-CM

## 2020-05-14 DIAGNOSIS — E785 Hyperlipidemia, unspecified: Secondary | ICD-10-CM

## 2020-05-14 MED ORDER — METHOCARBAMOL 500 MG PO TABS: 500.0000 mg | ORAL_TABLET | Freq: Three times a day (TID) | ORAL | 0 refills | Status: DC | PRN

## 2020-05-14 NOTE — Patient Instructions (Addendum)
   Good work on the weight loss.  Keep up the good work and increase activity slowly.  Increase vegetables in your diet.   We will call you with the lab results from today.   We have sent in a muscle relaxer for you, methocarbamol 500mg . Take one tablet one hour prior to laying down for bed.  This will relax the muscles but not cause weakness.  You may continue to use the Biofreze.  Use this at night to se if it helps you to sleep.

## 2020-05-15 LAB — URINALYSIS W MICROSCOPIC + REFLEX CULTURE
Bacteria, UA: NONE SEEN /HPF
Bilirubin Urine: NEGATIVE
Glucose, UA: NEGATIVE
Hgb urine dipstick: NEGATIVE
Hyaline Cast: NONE SEEN /LPF
Ketones, ur: NEGATIVE
Leukocyte Esterase: NEGATIVE
Nitrites, Initial: NEGATIVE
Protein, ur: NEGATIVE
RBC / HPF: NONE SEEN /HPF (ref 0–2)
Specific Gravity, Urine: 1.016 (ref 1.001–1.03)
Squamous Epithelial / HPF: NONE SEEN /HPF (ref <=5)
pH: 5.5 (ref 5.0–8.0)

## 2020-05-15 LAB — COMPLETE METABOLIC PANEL WITH GFR
AG Ratio: 1.7 (calc) (ref 1.0–2.5)
ALT: 40 U/L (ref 9–46)
AST: 17 U/L (ref 10–35)
Albumin: 3.8 g/dL (ref 3.6–5.1)
Alkaline phosphatase (APISO): 80 U/L (ref 35–144)
BUN/Creatinine Ratio: 15 (calc) (ref 6–22)
BUN: 28 mg/dL — ABNORMAL HIGH (ref 7–25)
CO2: 25 mmol/L (ref 20–32)
Calcium: 8.9 mg/dL (ref 8.6–10.3)
Chloride: 102 mmol/L (ref 98–110)
Creat: 1.87 mg/dL — ABNORMAL HIGH (ref 0.70–1.11)
GFR, Est African American: 38 mL/min/{1.73_m2} — ABNORMAL LOW (ref >=60)
GFR, Est Non African American: 33 mL/min/{1.73_m2} — ABNORMAL LOW (ref >=60)
Globulin: 2.3 g/dL (calc) (ref 1.9–3.7)
Glucose, Bld: 137 mg/dL — ABNORMAL HIGH (ref 65–99)
Potassium: 4.6 mmol/L (ref 3.5–5.3)
Sodium: 136 mmol/L (ref 135–146)
Total Bilirubin: 0.5 mg/dL (ref 0.2–1.2)
Total Protein: 6.1 g/dL (ref 6.1–8.1)

## 2020-05-15 LAB — CBC WITH DIFFERENTIAL/PLATELET
Absolute Monocytes: 976 cells/uL — ABNORMAL HIGH (ref 200–950)
Basophils Absolute: 60 cells/uL (ref 0–200)
Basophils Relative: 0.5 %
Eosinophils Absolute: 345 cells/uL (ref 15–500)
Eosinophils Relative: 2.9 %
HCT: 41.4 % (ref 38.5–50.0)
Hemoglobin: 13.9 g/dL (ref 13.2–17.1)
Lymphs Abs: 1345 cells/uL (ref 850–3900)
MCH: 29.4 pg (ref 27.0–33.0)
MCHC: 33.6 g/dL (ref 32.0–36.0)
MCV: 87.7 fL (ref 80.0–100.0)
MPV: 10.4 fL (ref 7.5–12.5)
Monocytes Relative: 8.2 %
Neutro Abs: 9175 cells/uL — ABNORMAL HIGH (ref 1500–7800)
Neutrophils Relative %: 77.1 %
Platelets: 206 10*3/uL (ref 140–400)
RBC: 4.72 10*6/uL (ref 4.20–5.80)
RDW: 12.5 % (ref 11.0–15.0)
Total Lymphocyte: 11.3 %
WBC: 11.9 10*3/uL — ABNORMAL HIGH (ref 3.8–10.8)

## 2020-05-15 LAB — MAGNESIUM: Magnesium: 2.1 mg/dL (ref 1.5–2.5)

## 2020-05-15 LAB — NO CULTURE INDICATED

## 2020-05-18 ENCOUNTER — Other Ambulatory Visit: Payer: Self-pay | Admitting: Internal Medicine

## 2020-05-18 DIAGNOSIS — I10 Essential (primary) hypertension: Secondary | ICD-10-CM

## 2020-05-18 MED ORDER — LISINOPRIL 40 MG PO TABS: ORAL_TABLET | ORAL | 0 refills | Status: DC

## 2020-05-21 ENCOUNTER — Encounter: Payer: Medicare Other | Admitting: Internal Medicine

## 2020-06-14 ENCOUNTER — Other Ambulatory Visit: Payer: Self-pay | Admitting: Internal Medicine

## 2020-06-21 ENCOUNTER — Encounter: Payer: Medicare Other | Admitting: Adult Health Nurse Practitioner

## 2020-06-25 ENCOUNTER — Telehealth: Payer: Self-pay

## 2020-06-25 NOTE — Telephone Encounter (Signed)
Patient states that he was exposed to someone who was positive on Saturday, Oct 2nd. Doesn't have any symptoms but is rescheduling his CPE until next week just to make sure.

## 2020-06-25 NOTE — Telephone Encounter (Signed)
Patient states that he was exposed to Covid and wants to know if he should get tested or not because he isn't showing symptoms.  Patient is scheduled with Danton Sewer for CPE tomorrow. Please advise.

## 2020-06-26 ENCOUNTER — Encounter: Payer: Medicare Other | Admitting: Adult Health Nurse Practitioner

## 2020-07-01 NOTE — Progress Notes (Addendum)
COMPLETE PHYSICAL  Assessment and Plan:   Edwin Osborne was seen today for annual exam.  Diagnoses and all orders for this visit:  Encounter for annual physical exam Yearly  Essential hypertension Continue current medications: Lisinopril 40mg  Monitor blood pressure at home; call if consistently over 130/80 Continue DASH diet.   Reminder to go to the ER if any CP, SOB, nausea, dizziness, severe HA, changes vision/speech, left arm numbness and tingling and jaw pain. -     CBC with Differential/Platelet -     COMPLETE METABOLIC PANEL WITH GFR -     Magnesium  Hyperlipidemia associated with type 2 diabetes mellitus (HCC) Continue medications:Pravastatin 40mg  daily Discussed dietary and exercise modifications Low fat diet LDL goal <70 -     Lipid panel  Thoracic aortic atherosclerosis (HCC) Control blood pressure, lipids and glucose Disscused lifestyle modifications, diet & exercise Continue to monitor  Type 2 diabetes mellitus with stage 3b chronic kidney disease, without long-term current use of insulin (HCC) Continue glimepiride - 4 mg AM, add 1/2 tab in evenings if fasting glucose persistently 150+  Off of metformin due to advancing CKD Long discussion about dietary choices and physical activity Perform daily foot/skin check, notify office of any concerning changes.  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 Sensation in tact  Stage 3 chronic kidney disease due to type 2 diabetes mellitus (HCC) Increase fluids  Avoid NSAIDS Blood pressure control Monitor sugars  Will continue to monitor  BPH with obstruction/lower urinary tract symptoms Doing well at this time Continue medications:  Will continue to monitor Defer PSA this check  Vitamin D deficiency Continue supplementation to maintain goal of 70-100 Taking Vitamin D 1,000 IU daily Defer vitamin D level   Class 2 severe obesity due to excess calories  with serious comorbidity and body mass index (BMI) of 36.0 to 36.9 in adult [facility]) Discussed dietary and exercise modifications Discussed ideal weight for height (below 176) and initial weight goal (242) Patient will work on increasing vegetables and increase length and frequency of exercise Will follow up in 3 months  Medication management Continued  Need for influenza vaccination -     Flu vaccine HIGH DOSE PF Discussed vaccination and side effcts  Skin Tags / Actinic Keratosis lateral right chest Getting caught on shirt Make follow up for removal    Continue diet and meds as discussed. Further disposition pending results of labs. Discussed med's effects and SE's.   Over 30 minutes of exam, counseling, chart review, and critical decision making was performed.   Future Appointments  Date Time Provider [place]  07/17/2020  4:00 PM Garnet Sierras, NP [facility] None  10/04/2020 11:15 AM Liane Comber, NP [facility] None  07/02/2021  9:00 AM Garnet Sierras, NP [facility] None   During the course of the visit the patient was educated and counseled about appropriate screening and preventive services including:    Pneumococcal vaccine   Influenza vaccine  Td vaccine  Screening electrocardiogram  Colorectal cancer screening  Diabetes screening  Glaucoma screening  Nutrition counseling    -------------------------------------------------------------------------------------------------------------------------  HPI 85 y.o. male  presents for 6 week follow up on HTN, poorly controlled DMII with CKD and neuropathy, morbid obesity.    He has bil knee pain, has seen Dr. Marlou Sa but several years ago, bothering him more and plans to follow up.    BMI is Body mass index is 35.74 kg/m., he has not  been working on diet, no exercise due to knee pain, can't even tolerate stationary cycle. He gained weight and wife has put him back on diet, she fixes his plate.  Wt  Readings from Last 3 Encounters:  07/02/20 242 lb (109.8 kg)  05/14/20 229 lb (103.9 kg)  05/05/20 248 lb 14.4 oz (112.9 kg)   He does not check his BP at home, today their BP is BP: 130/75   He does workout. He denies chest pain, shortness of breath, dizziness.    He is on cholesterol medication (pravastatin 40 mg daily) and denies myalgias. His cholesterol is not at goal. The cholesterol last visit was:   Lab Results  Component Value Date   CHOL 159 03/23/2020   HDL 38 (L) 03/23/2020   LDLCALC 83 03/23/2020   TRIG 288 (H) 03/23/2020   CHOLHDL 4.2 03/23/2020    He has not been working on diet and exercise for DMII and has CKD stage 3 P neuropathy, Bilateral lower extremities Hyperlipidemia on pravastatin On ASA, ACEi Off metformin due to GFR <45 Glimeprid $RemoveBefor'4mg'zGkMrlHUoHHF$  in AM, half tablt if evning glucos <150. Meter: Freestyle Checks daily  Denies hyperglycemia, hypoglycemia , increased appetite, nausea, polydipsia and polyuria.  Last A1C in the office wasl:  Lab Results  Component Value Date   HGBA1C 9.5 (H) 03/23/2020    He has CKD III associated with T2DM monitored via this office:  Lab Results  Component Value Date   GFRNONAA 33 (L) 05/14/2020   Patient is on Vitamin D supplement and at goal:  Lab Results  Component Value Date   VD25OH 80 11/24/2019        Current Medications:  Current Outpatient Medications on File Prior to Visit  Medication Sig  . acetaminophen (TYLENOL) 500 MG tablet Take 500 mg by mouth every 6 (six) hours as needed for mild pain, fever or headache.  . albuterol (VENTOLIN HFA) 108 (90 Base) MCG/ACT inhaler Inhale 2 puffs into the lungs every 6 (six) hours as needed for wheezing or shortness of breath.  Marland Kitchen aspirin EC 81 MG tablet Take 81 mg by mouth at bedtime.   . Blood Glucose Monitoring Suppl (ONE TOUCH ULTRA 2) w/Device KIT check blood sugar 1 time daily-E11.21  . Cholecalciferol (VITAMIN D-3) 25 MCG (1000 UT) CAPS Take 1,000 Units by mouth at  bedtime.  . Cinnamon 500 MG TABS Take 1,000 mg by mouth daily.   Marland Kitchen doxazosin (CARDURA) 8 MG tablet Take 1 tablet at Bedtime for BP & Prostate (Patient taking differently: Take 8 mg by mouth at bedtime. )  . famotidine (PEPCID) 40 MG tablet Take 1 tablet Daily for Indigestion & Heartburn (Patient taking differently: Take 40 mg by mouth daily. )  . glimepiride (AMARYL) 4 MG tablet TAKE 1 TABLET BY MOUTH WITH BREAKFAST. IF FASTING GLUCOSE REMAINS ABOVE 150 IN THE MORNING TAKE ONE-HALF TABLET WITH DINNER. ONLY TAKE IF EATING. (Patient taking differently: Take 2-4 mg by mouth See admin instructions. Take 4 mg by mouth in the morning with breakfast and if fasting glucose remains above 150 in the morning, take 2 mg with dinner, and ONLY if eating)  . glucose blood (ONETOUCH ULTRA) test strip Check blood sugasr 1 time daily-E11.21  . ipratropium-albuterol (DUONEB) 0.5-2.5 (3) MG/3ML SOLN Take 3 mLs by nebulization every 6 (six) hours as needed (for wheezing or shortness of breath).  . Lancets (ONETOUCH ULTRASOFT) lancets Check blood sugar 1 time daily-E11.21  . lisinopril (ZESTRIL) 40 MG tablet Take 1  tablet Daily for BP & Diabetic Kidney Protection  . magnesium oxide (MAG-OX) 400 MG tablet Take 400 mg by mouth daily.  . methocarbamol (ROBAXIN) 500 MG tablet Take 1 tablet (500 mg total) by mouth every 8 (eight) hours as needed for muscle spasms. Take one tablet one hour prior to going to bed.  . Multiple Vitamins-Minerals (ONE-A-DAY MENS 50+ ADVANTAGE) TABS Take 1 tablet by mouth daily with breakfast.  . Omega-3 Fatty Acids (FISH OIL) 1200 MG CAPS Take 1,200 mg by mouth daily.  . pravastatin (PRAVACHOL) 40 MG tablet Take     1 tablet      at Bedtime      for Cholesterol   No current facility-administered medications on file prior to visit.    Medical History:  Past Medical History:  Diagnosis Date  . Arthritis   . BPH (benign prostatic hyperplasia)   . Cataract   . CKD (chronic kidney disease), stage  III ([place])   . Depression    "usually a happy go lucky"  . Diabetes mellitus   . Diverticulosis   . GERD (gastroesophageal reflux disease)    sometimes will take omeprazole  . Hyperlipidemia   . Hypertension    Allergies Allergies  Allergen Reactions  . Tramadol Other (See Comments)    "Made his head feel funny"    SURGICAL HISTORY He  has a past surgical history that includes Tonsillectomy and adenoidectomy; Vein ligation and stripping; Knee arthroscopy; Colonoscopy (2005, 08/21/11); Fracture surgery; Cholecystectomy (N/A, 11/29/2015); and Cataract extraction, bilateral (Bilateral, 2020). FAMILY HISTORY His family history includes Diabetes in his brother; Prostate cancer in his brother. SOCIAL HISTORY He  reports that he has been smoking cigars. He has never used smokeless tobacco. He reports current alcohol use. He reports that he does not use drugs.  Immunization History  Administered Date(s) Administered  . DT (Pediatric) 08/31/2015  . Influenza Whole 05/24/2011, 08/06/2012, 07/07/2013  . Influenza, High Dose Seasonal PF 06/21/2014, 08/31/2015, 06/03/2016, 07/15/2017, 07/29/2018, 07/04/2019, 07/02/2020  . PFIZER SARS-COV-2 Vaccination 10/15/2019, 11/06/2019  . Pneumococcal Conjugate-13 06/21/2014  . Pneumococcal Polysaccharide-23 03/10/2005   Preventative care: Last colonoscopy: 2012 due 2015 but will not have another, declines, also won't do hemoccult.  Discussed with patient.  Prior vaccinations: TD or Tdap: 2016 Influenza: 06/2019 Pneumococcal: 2006 Prevnar13: 2015 Shingles/Zostavax: declines  Eye exam: Du for 2021 Dentist: Due for 2021, has full upper partials.   Patient Care Team: Unk Pinto, MD as PCP - General (Internal Medicine) Marlou Sa Tonna Corner, MD as Consulting Physician (Orthopedic Surgery)    Review of Systems:  Review of Systems  Constitutional: Negative for chills, diaphoresis, fever, malaise/fatigue and weight loss.  HENT: Negative for  congestion, ear discharge, ear pain, hearing loss, nosebleeds, sinus pain, sore throat and tinnitus.   Eyes: Negative for blurred vision, double vision, photophobia, pain, discharge and redness.  Respiratory: Negative for cough, hemoptysis, sputum production, shortness of breath, wheezing and stridor.   Cardiovascular: Negative for chest pain, palpitations, orthopnea, claudication, leg swelling and PND.  Gastrointestinal: Negative for abdominal pain, blood in stool, constipation, diarrhea, heartburn, melena, nausea and vomiting.  Genitourinary: Negative for dysuria, flank pain, frequency, hematuria and urgency.  Musculoskeletal: Negative for back pain, falls, joint pain, myalgias and neck pain.  Skin: Negative for itching and rash.  Neurological: Positive for tingling. Negative for dizziness, tremors, sensory change, speech change, focal weakness, seizures, loss of consciousness, weakness and headaches.       Lower bilateral extremities, intermittent  Endo/Heme/Allergies: Negative for  environmental allergies and polydipsia. Does not bruise/bleed easily.  Psychiatric/Behavioral: Negative for depression, hallucinations, memory loss, substance abuse and suicidal ideas. The patient is not nervous/anxious and does not have insomnia.     Physical Exam: BP 130/75   Pulse 91   Temp 97.7 F (36.5 C)   Ht $R'5\' 9"'Az$  (1.753 m)   Wt 242 lb (109.8 kg)   SpO2 94%   BMI 35.74 kg/m  Wt Readings from Last 3 Encounters:  07/02/20 242 lb (109.8 kg)  05/14/20 229 lb (103.9 kg)  05/05/20 248 lb 14.4 oz (112.9 kg)   General Appearance: Well nourished, obese, in no apparent distress. Eyes: PERRLA, EOMs, conjunctiva no swelling or erythema Sinuses: No Frontal/maxillary tenderness ENT/Mouth: Ext aud canals clear, L TMs without erythema, bulging, R TM with extensive scarring, erythematous around bony structures (per the patient he has chronic drainage issue that has been cleared by ENT). No erythema, swelling, or  exudate on post pharynx.  Tonsils not swollen or erythematous. Hearing normal.  Neck: Supple, thyroid normal.  Respiratory: Respiratory effort normal, BS equal bilaterally without rales, rhonchi, wheezing or stridor.  Cardio: RRR with no MRGs. 1+ peripheral pulses equally with 1+ non-pitting edema bilateral ankles.  Abdomen: Soft, rounded obese abdomen + BS.  Non tender, no guarding, rebound, palpable hernias or masses. Lymphatics: Non tender without lymphadenopathy.  Musculoskeletal: BIl knees with bony enlargement, crepitus, no effusion, 5/5 strength, antalgic gait Skin: Warm, dry without rashes, lesions, ecchymosis.  Neuro: Cranial nerves intact. No cerebellar symptoms. Sensation in heels diminished bilaterally to monofilament testing.  Psych: Awake and oriented X 3, normal affect, Insight and Judgment appropriate.    EKG: RBBB No ST changes    Garnet Sierras, Laqueta Jean, DNP [facility] 07/02/2020  1:21 PM

## 2020-07-02 ENCOUNTER — Encounter: Payer: Self-pay | Admitting: Adult Health Nurse Practitioner

## 2020-07-02 ENCOUNTER — Ambulatory Visit (INDEPENDENT_AMBULATORY_CARE_PROVIDER_SITE_OTHER): Payer: Medicare Other | Admitting: Adult Health Nurse Practitioner

## 2020-07-02 ENCOUNTER — Other Ambulatory Visit: Payer: Self-pay

## 2020-07-02 VITALS — BP 130/75 | HR 91 | Temp 97.7°F | Ht 69.0 in | Wt 242.0 lb

## 2020-07-02 DIAGNOSIS — Z0000 Encounter for general adult medical examination without abnormal findings: Secondary | ICD-10-CM | POA: Diagnosis not present

## 2020-07-02 DIAGNOSIS — E785 Hyperlipidemia, unspecified: Secondary | ICD-10-CM

## 2020-07-02 DIAGNOSIS — N183 Chronic kidney disease, stage 3 unspecified: Secondary | ICD-10-CM

## 2020-07-02 DIAGNOSIS — Z23 Encounter for immunization: Secondary | ICD-10-CM | POA: Diagnosis not present

## 2020-07-02 DIAGNOSIS — Z79899 Other long term (current) drug therapy: Secondary | ICD-10-CM

## 2020-07-02 DIAGNOSIS — Z136 Encounter for screening for cardiovascular disorders: Secondary | ICD-10-CM

## 2020-07-02 DIAGNOSIS — E1169 Type 2 diabetes mellitus with other specified complication: Secondary | ICD-10-CM

## 2020-07-02 DIAGNOSIS — L570 Actinic keratosis: Secondary | ICD-10-CM

## 2020-07-02 DIAGNOSIS — E66812 Obesity, class 2: Secondary | ICD-10-CM

## 2020-07-02 DIAGNOSIS — I10 Essential (primary) hypertension: Secondary | ICD-10-CM

## 2020-07-02 DIAGNOSIS — I700 Atherosclerosis of aorta: Secondary | ICD-10-CM

## 2020-07-02 DIAGNOSIS — E1142 Type 2 diabetes mellitus with diabetic polyneuropathy: Secondary | ICD-10-CM

## 2020-07-02 DIAGNOSIS — N1832 Chronic kidney disease, stage 3b: Secondary | ICD-10-CM

## 2020-07-02 DIAGNOSIS — E1122 Type 2 diabetes mellitus with diabetic chronic kidney disease: Secondary | ICD-10-CM

## 2020-07-02 DIAGNOSIS — N401 Enlarged prostate with lower urinary tract symptoms: Secondary | ICD-10-CM

## 2020-07-02 DIAGNOSIS — E559 Vitamin D deficiency, unspecified: Secondary | ICD-10-CM

## 2020-07-02 DIAGNOSIS — L918 Other hypertrophic disorders of the skin: Secondary | ICD-10-CM

## 2020-07-02 DIAGNOSIS — N138 Other obstructive and reflux uropathy: Secondary | ICD-10-CM

## 2020-07-02 NOTE — Patient Instructions (Addendum)
You has your Influenza vaccination today 07/02/20.   If you are taking Wal-Fex (Fexofenadine), half tablet.  You can try taking a whole tablet.   You can also try Wal-Zyr (Cetirazine) at night time because it can make you sleepy.   We will call you with your lab results from today.   Continue to monitor you diet and decrease carbohydrates, white foods, bread sugar and pasta.  Increase vegetable intake.  Increase your water intake, 6 bottles of water a day.      Ask insurance and pharmacy about shingrix - it is a 2 part shot that we will not be getting in the office.   Suggest getting AFTER covid vaccines, have to wait at least a month This shot can make you feel bad due to such good immune response it can trigger some inflammation so take tylenol or aleve day of or day after and plan on resting.   Can go to [email or web address] for more information  Shingrix Vaccination  Two vaccines are licensed and recommended to prevent shingles in the [place] Zoster vaccine live (ZVL, Zostavax) has been in use since 2006. Recombinant zoster vaccine (RZV, Shingrix), has been in use since 2017 and is recommended by AC[employer] as the preferred shingles vaccine.  What Everyone Should Know about Shingles Vaccine (Shingrix) One of the Recommended Vaccines by Disease Shingles vaccination is the only way to protect against shingles and postherpetic neuralgia (PHN), the most common complication from shingles. CDC recommends that healthy adults 50 years and older get two doses of the shingles vaccine called Shingrix (recombinant zoster vaccine), separated by 2 to 6 months, to prevent shingles and the complications from the disease. Your doctor or pharmacist can give you Shingrix as a shot in your upper arm. Shingrix provides strong protection against shingles and PHN. Two doses of Shingrix is more than 90% effective at preventing shingles and PHN. Protection stays  above 85% for at least the first four years after you get vaccinated. Shingrix is the preferred vaccine, over Zostavax (zoster vaccine live), a shingles vaccine in use since 2006. Zostavax may still be used to prevent shingles in healthy adults 60 years and older. For example, you could use Zostavax if a person is allergic to Shingrix, prefers Zostavax, or requests immediate vaccination and Shingrix is unavailable. Who Should Get Shingrix? Healthy adults 50 years and older should get two doses of Shingrix, separated by 2 to 6 months. You should get Shingrix even if in the past you . had shingles  . received Zostavax  . are not sure if you had chickenpox There is no maximum age for getting Shingrix. If you had shingles in the past, you can get Shingrix to help prevent future occurrences of the disease. There is no specific length of time that you need to wait after having shingles before you can receive Shingrix, but generally you should make sure the shingles rash has gone away before getting vaccinated. You can get Shingrix whether or not you remember having had chickenpox in the past. Studies show that more than 99% of Americans 40 years and older have had chickenpox, even if they don't remember having the disease. Chickenpox and shingles are related because they are caused by the same virus (varicella zoster virus). After a person recovers from chickenpox, the virus stays dormant (inactive) in the body. It can reactivate years later and cause shingles. If you had Zostavax in the recent past, you should wait at least eight weeks before getting Shingrix.  Talk to your healthcare provider to determine the best time to get Shingrix. Shingrix is available in [facility] and pharmacies. To find doctor's offices or pharmacies near you that offer the vaccine, visit [facility] Vaccine FinderExternal. If you have questions about Shingrix, talk with your healthcare provider. Vaccine for Those 18 Years and  Older  Shingrix reduces the risk of shingles and PHN by more than 90% in people 25 and older. CDC recommends the vaccine for healthy adults 14 and older.  Who Should Not Get Shingrix? You should not get Shingrix if you: . have ever had a severe allergic reaction to any component of the vaccine or after a dose of Shingrix  . tested negative for immunity to varicella zoster virus. If you test negative, you should get chickenpox vaccine.  . currently have shingles  . currently are pregnant or breastfeeding. Women who are pregnant or breastfeeding should wait to get Shingrix.  Marland Kitchen receive specific antiviral drugs (acyclovir, famciclovir, or valacyclovir) 24 hours before vaccination (avoid use of these antiviral drugs for 14 days after vaccination)- zoster vaccine live only If you have a minor acute (starts suddenly) illness, such as a cold, you may get Shingrix. But if you have a moderate or severe acute illness, you should usually wait until you recover before getting the vaccine. This includes anyone with a temperature of 101.25F or higher. The side effects of the Shingrix are temporary, and usually last 2 to 3 days. While you may experience pain for a few days after getting Shingrix, the pain will be less severe than having shingles and the complications from the disease. How Well Does Shingrix Work? Two doses of Shingrix provides strong protection against shingles and postherpetic neuralgia (PHN), the most common complication of shingles. . In adults 8 to 83 years old who got two doses, Shingrix was 97% effective in preventing shingles; among adults 70 years and older, Shingrix was 91% effective.  . In adults 79 to 83 years old who got two doses, Shingrix was 91% effective in preventing PHN; among adults 70 years and older, Shingrix was 89% effective. Shingrix protection remained high (more than 85%) in people 70 years and older throughout the four years following vaccination. Since your risk of  shingles and PHN increases as you get older, it is important to have strong protection against shingles in your older years. Top of Page  What Are the Possible Side Effects of Shingrix? Studies show that Shingrix is safe. The vaccine helps your body create a strong defense against shingles. As a result, you are likely to have temporary side effects from getting the shots. The side effects may affect your ability to do normal daily activities for 2 to 3 days. Most people got a sore arm with mild or moderate pain after getting Shingrix, and some also had redness and swelling where they got the shot. Some people felt tired, had muscle pain, a headache, shivering, fever, stomach pain, or nausea. About 1 out of 6 people who got Shingrix experienced side effects that prevented them from doing regular activities. Symptoms went away on their own in about 2 to 3 days. Side effects were more common in younger people. You might have a reaction to the first or second dose of Shingrix, or both doses. If you experience side effects, you may choose to take over-the-counter pain medicine such as ibuprofen or acetaminophen. If you experience side effects from Shingrix, you should report them to the Vaccine Adverse Event Reporting System (VAERS).  Your doctor might file this report, or you can do it yourself through the VAERS websiteExternal, or by calling [phone]. If you have any questions about side effects from Shingrix, talk with your doctor. The shingles vaccine does not contain thimerosal (a preservative containing mercury). Top of Page  When Should I See a Doctor Because of the Side Effects I Experience From Shingrix? In clinical trials, Shingrix was not associated with serious adverse events. In fact, serious side effects from vaccines are extremely rare. For example, for every 1 million doses of a vaccine given, only one or two people may have a severe allergic reaction. Signs of an allergic reaction happen  within minutes or hours after vaccination and include hives, swelling of the face and throat, difficulty breathing, a fast heartbeat, dizziness, or weakness. If you experience these or any other life-threatening symptoms, see a doctor right away. Shingrix causes a strong response in your immune system, so it may produce short-term side effects more intense than you are used to from other vaccines. These side effects can be uncomfortable, but they are expected and usually go away on their own in 2 or 3 days. Top of Page  How Can I Pay For Shingrix? There are several ways shingles vaccine may be paid for: Medicare . Medicare Part D plans cover the shingles vaccine, but there may be a cost to you depending on your plan. There may be a copay for the vaccine, or you may need to pay in full then get reimbursed for a certain amount.  . Medicare Part B does not cover the shingles vaccine. Medicaid . Medicaid may or may not cover the vaccine. Contact your insurer to find out. Private health insurance . Many private health insurance plans will cover the vaccine, but there may be a cost to you depending on your plan. Contact your insurer to find out. Vaccine assistance programs . Some pharmaceutical companies provide vaccines to eligible adults who cannot afford them. You may want to check with the vaccine manufacturer, [employer], about Shingrix. If you do not currently have health insurance, learn more about affordable health coverage optionsExternal. To find doctor's offices or pharmacies near you that offer the vaccine, visit HealthMap Vaccine FinderExternal.    Due to recent changes in healthcare laws, you may see the results of your imaging and laboratory studies on MyChart before your provider has had a chance to review them.  We understand that in some cases there may be results that are confusing or concerning to you. Not all laboratory results come back in the same time frame and the provider  may be waiting for multiple results in order to interpret others.  Please give [place] 48 hours in order for your provider to thoroughly review all the results before contacting the office for clarification of your results.   +++++++++++++++++++++++++++++++  Vit D  & Vit C 1,000 mg   are recommended to help protect  against the Covid-19 and other Corona viruses.    Also it's recommended  to take  Zinc 50 mg  to help  protect against the Covid-19   and best place to get  is also on [employer]  and don't pay more than 6-8 cents /pill !  ================================ Coronavirus (COVID-19) Are you at risk?  Are you at risk for the Coronavirus (COVID-19)?  To be considered HIGH RISK for Coronavirus (COVID-19), you have to meet the following criteria:  . Traveled to [place], [place], [place], [place] or [place]; or in  the [place] to [place], [place], [place]  . or [place]; and have fever, cough, and shortness of breath within the last 2 weeks of travel OR . Been in close contact with a person diagnosed with COVID-19 within the last 2 weeks and have  . fever, cough,and shortness of breath .  . IF YOU DO NOT MEET THESE CRITERIA, YOU ARE CONSIDERED LOW RISK FOR COVID-19.  What to do if you are HIGH RISK for COVID-19?  Marland Kitchen If you are having a medical emergency, call 911. . Seek medical care right away. Before you go to a doctor's office, urgent care or emergency department, .  call ahead and tell them about your recent travel, contact with someone diagnosed with COVID-19  .  and your symptoms.  . You should receive instructions from your physician's office regarding next steps of care.  . When you arrive at healthcare provider, tell the healthcare staff immediately you have returned from  . visiting [place], [place], [place], [place] or [place]; or traveled in the [place] to [place], [place],  . [place] or [place] in the last two weeks or you have been in close  contact with a person diagnosed with  . COVID-19 in the last 2 weeks.   . Tell the health care staff about your symptoms: fever, cough and shortness of breath. . After you have been seen by a medical provider, you will be either: o Tested for (COVID-19) and discharged home on quarantine except to seek medical care if  o symptoms worsen, and asked to  - Stay home and avoid contact with others until you get your results (4-5 days)  - Avoid travel on public transportation if possible (such as bus, train, or airplane) or o Sent to the Emergency Department by EMS for evaluation, COVID-19 testing  and  o possible admission depending on your condition and test results.  What to do if you are LOW RISK for COVID-19?  Reduce your risk of any infection by using the same precautions used for avoiding the common cold or flu:  Marland Kitchen Wash your hands often with soap and warm water for at least 20 seconds.  If soap and water are not readily available,  . use an alcohol-based hand sanitizer with at least 60% alcohol.  . If coughing or sneezing, cover your mouth and nose by coughing or sneezing into the elbow areas of your shirt or coat, .  into a tissue or into your sleeve (not your hands). . Avoid shaking hands with others and consider head nods or verbal greetings only. . Avoid touching your eyes, nose, or mouth with unwashed hands.  . Avoid close contact with people who are sick. . Avoid places or events with large numbers of people in one location, like concerts or sporting events. . Carefully consider travel plans you have or are making. . If you are planning any travel outside or inside the [place], visit the CDC's Travelers' Health webpage for the latest health notices. . If you have some symptoms but not all symptoms, continue to monitor at home and seek medical attention  . if your symptoms worsen. . If you are having a medical emergency, call 911. >>>>>>>>>>>>>>>>>>>>>>>>>>>>>>>>>>>>>>>>>>>>>>>>>>>>>>> We  Do NOT Approve of  [facility] [place] Soliciting Our Patients  To Do Home Visits  & We Do NOT Approve of [employer] > > > > > > > > > > > > > > > > > > > > > > > > > > > > > > > > > > >  > > > >  Preventive Care for Adults  A healthy lifestyle and preventive care can promote health and wellness. Preventive health guidelines for men include the following key practices:  A routine yearly physical is a good way to check with your health care provider about your health and preventative screening. It is a chance to share any concerns and updates on your health and to receive a thorough exam.  Visit your dentist for a routine exam and preventative care every 6 months. Brush your teeth twice a day and floss once a day. Good oral hygiene prevents tooth decay and gum disease.  The frequency of eye exams is based on your age, health, family medical history, use of contact lenses, and other factors. Follow your health care provider's recommendations for frequency of eye exams.  Eat a healthy diet. Foods such as vegetables, fruits, whole grains, low-fat dairy products, and lean protein foods contain the nutrients you need without too many calories. Decrease your intake of foods high in solid fats, added sugars, and salt. Eat the right amount of calories for you. Get information about a proper diet from your health care provider, if necessary.  Regular physical exercise is one of the most important things you can do for your health. Most adults should get at least 150 minutes of moderate-intensity exercise (any activity that increases your heart rate and causes you to sweat) each week. In addition, most adults need muscle-strengthening exercises on 2 or more days a week.  Maintain a healthy weight. The body mass index (BMI) is a screening tool to identify possible weight problems. It provides an estimate of body fat based on height and weight. Your health care provider can find your  BMI and can help you achieve or maintain a healthy weight. For adults 20 years and older:  A BMI below 18.5 is considered underweight.  A BMI of 18.5 to 24.9 is normal.  A BMI of 25 to 29.9 is considered overweight.  A BMI of 30 and above is considered obese.  Maintain normal blood lipids and cholesterol levels by exercising and minimizing your intake of saturated fat. Eat a balanced diet with plenty of fruit and vegetables. Blood tests for lipids and cholesterol should begin at age 74 and be repeated every 5 years. If your lipid or cholesterol levels are high, you are over 50, or you are at high risk for heart disease, you may need your cholesterol levels checked more frequently. Ongoing high lipid and cholesterol levels should be treated with medicines if diet and exercise are not working.  If you smoke, find out from your health care provider how to quit. If you do not use tobacco, do not start.  Lung cancer screening is recommended for adults aged 44-80 years who are at high risk for developing lung cancer because of a history of smoking. A yearly low-dose CT scan of the lungs is recommended for people who have at least a 30-pack-year history of smoking and are a current smoker or have quit within the past 15 years. A pack year of smoking is smoking an average of 1 pack of cigarettes a day for 1 year (for example: 1 pack a day for 30 years or 2 packs a day for 15 years). Yearly screening should continue until the smoker has stopped smoking for at least 15 years. Yearly screening should be stopped for people who develop a health problem that would prevent them from having lung cancer treatment.  If you choose to drink alcohol, do  not have more than 2 drinks per day. One drink is considered to be 12 ounces (355 mL) of beer, 5 ounces (148 mL) of wine, or 1.5 ounces (44 mL) of liquor.  Avoid use of street drugs. Do not share needles with anyone. Ask for help if you need support or instructions  about stopping the use of drugs.  High blood pressure causes heart disease and increases the risk of stroke. Your blood pressure should be checked at least every 1-2 years. Ongoing high blood pressure should be treated with medicines, if weight loss and exercise are not effective.  If you are 70-15 years old, ask your health care provider if you should take aspirin to prevent heart disease.  Diabetes screening involves taking a blood sample to check your fasting blood sugar level. Testing should be considered at a younger age or be carried out more frequently if you are overweight and have at least 1 risk factor for diabetes.  Colorectal cancer can be detected and often prevented. Most routine colorectal cancer screening begins at the age of 63 and continues through age 48. However, your health care provider may recommend screening at an earlier age if you have risk factors for colon cancer. On a yearly basis, your health care provider may provide home test kits to check for hidden blood in the stool. Use of a small camera at the end of a tube to directly examine the colon (sigmoidoscopy or colonoscopy) can detect the earliest forms of colorectal cancer. Talk to your health care provider about this at age 43, when routine screening begins. Direct exam of the colon should be repeated every 5-10 years through age 49, unless early forms of precancerous polyps or small growths are found.  Hepatitis C blood testing is recommended for all people born from [birth year] through 1963 and any individual with known risks for hepatitis C.  Screening for abdominal aortic aneurysm (AAA)  by ultrasound is recommended for people who have history of high blood pressure or who are current or former smokers.  Healthy men should  receive prostate-specific antigen (PSA) blood tests as part of routine cancer screening. Talk with your health care provider about prostate cancer screening.  Testicular cancer screening is   recommended for adult males. Screening includes self-exam, a health care provider exam, and other screening tests. Consult with your health care provider about any symptoms you have or any concerns you have about testicular cancer.  Use sunscreen. Apply sunscreen liberally and repeatedly throughout the day. You should seek shade when your shadow is shorter than you. Protect yourself by wearing long sleeves, pants, a wide-brimmed hat, and sunglasses year round, whenever you are outdoors.  Once a month, do a whole-body skin exam, using a mirror to look at the skin on your back. Tell your health care provider about new moles, moles that have irregular borders, moles that are larger than a pencil eraser, or moles that have changed in shape or color.  Stay current with required vaccines (immunizations).  Influenza vaccine. All adults should be immunized every year.  Tetanus, diphtheria, and acellular pertussis (Td, Tdap) vaccine. An adult who has not previously received Tdap or who does not know his vaccine status should receive 1 dose of Tdap. This initial dose should be followed by tetanus and diphtheria toxoids (Td) booster doses every 10 years. Adults with an unknown or incomplete history of completing a 3-dose immunization series with Td-containing vaccines should begin or complete a primary immunization series including a  Tdap dose. Adults should receive a Td booster every 10 years.  Zoster vaccine. One dose is recommended for adults aged 61 years or older unless certain conditions are present.    PREVNAR - Pneumococcal 13-valent conjugate (PCV13) vaccine. When indicated, a person who is uncertain of his immunization history and has no record of immunization should receive the PCV13 vaccine. An adult aged 11 years or older who has certain medical conditions and has not been previously immunized should receive 1 dose of PCV13 vaccine. This PCV13 should be followed with a dose of pneumococcal  polysaccharide (PPSV23) vaccine. The PPSV23 vaccine dose should be obtained 1 or more year(s)after the dose of PCV13 vaccine. An adult aged 38 years or older who has certain medical conditions and previously received 1 or more doses of PPSV23 vaccine should receive 1 dose of PCV13. The PCV13 vaccine dose should be obtained 1 or more years after the last PPSV23 vaccine dose.    PNEUMOVAX - Pneumococcal polysaccharide (PPSV23) vaccine. When PCV13 is also indicated, PCV13 should be obtained first. All adults aged 79 years and older should be immunized. An adult younger than age 69 years who has certain medical conditions should be immunized. Any person who resides in a nursing home or long-term care facility should be immunized. An adult smoker should be immunized. People with an immunocompromised condition and certain other conditions should receive both PCV13 and PPSV23 vaccines. People with human immunodeficiency virus (HIV) infection should be immunized as soon as possible after diagnosis. Immunization during chemotherapy or radiation therapy should be avoided. Routine use of PPSV23 vaccine is not recommended for American Indians, [place] Natives, or people younger than 65 years unless there are medical conditions that require PPSV23 vaccine. When indicated, people who have unknown immunization and have no record of immunization should receive PPSV23 vaccine. One-time revaccination 5 years after the first dose of PPSV23 is recommended for people aged 19-64 years who have chronic kidney failure, nephrotic syndrome, asplenia, or immunocompromised conditions. People who received 1-2 doses of PPSV23 before age 69 years should receive another dose of PPSV23 vaccine at age 96 years or later if at least 5 years have passed since the previous dose. Doses of PPSV23 are not needed for people immunized with PPSV23 at or after age 66 years.    Hepatitis A vaccine. Adults who wish to be protected from this disease, have  certain high-risk conditions, work with hepatitis A-infected animals, work in hepatitis A research labs, or travel to or work in countries with a high rate of hepatitis A should be immunized. Adults who were previously unvaccinated and who anticipate close contact with an international adoptee during the first 60 days after arrival in the [place] from a country with a high rate of hepatitis A should be immunized.    Hepatitis B vaccine. Adults should be immunized if they wish to be protected from this disease, have certain high-risk conditions, may be exposed to blood or other infectious body fluids, are household contacts or sex partners of hepatitis B positive people, are clients or workers in certain care facilities, or travel to or work in countries with a high rate of hepatitis B.   Preventive Service / Frequency   Ages 40 and over  Blood pressure check.  Lipid and cholesterol check.  Lung cancer screening. / Every year if you are aged 44-80 years and have a 30-pack-year history of smoking and currently smoke or have quit within the past 15 years. Yearly screening is  stopped once you have quit smoking for at least 15 years or develop a health problem that would prevent you from having lung cancer treatment.  Fecal occult blood test (FOBT) of stool. You may not have to do this test if you get a colonoscopy every 10 years.  Flexible sigmoidoscopy** or colonoscopy.** / Every 5 years for a flexible sigmoidoscopy or every 10 years for a colonoscopy beginning at age 11 and continuing until age 41.  Hepatitis C blood test.** / For all people born from [birth year] through 1963 and any individual with known risks for hepatitis C.  Abdominal aortic aneurysm (AAA) screening./ Screening current or former smokers or have Hypertension.  Skin self-exam. / Monthly.  Influenza vaccine. / Every year.  Tetanus, diphtheria, and acellular pertussis (Tdap/Td) vaccine.** / 1 dose of Td every 10  years.   Zoster vaccine.** / 1 dose for adults aged 19 years or older.         Pneumococcal 13-valent conjugate (PCV13) vaccine.    Pneumococcal polysaccharide (PPSV23) vaccine.     Hepatitis A vaccine.** / Consult your health care provider.  Hepatitis B vaccine.** / Consult your health care provider. Screening for abdominal aortic aneurysm (AAA)  by ultrasound is recommended for people who have history of high blood pressure or who are current or former smokers. ++++++++++ Recommend Adult Low Dose Aspirin or  coated  Aspirin 81 mg daily  To reduce risk of Colon Cancer 40 %,  Skin Cancer 26 % ,  Malignant Melanoma 46%  and  Pancreatic cancer 60% ++++++++++++++++++++++ Vitamin D goal  is between 70-100.  Please make sure that you are taking your Vitamin D as directed.  It is very important as a natural anti-inflammatory  helping hair, skin, and nails, as well as reducing stroke and heart attack risk.  It helps your bones and helps with mood. It also decreases numerous cancer risks so please take it as directed.  Low Vit D is associated with a 200-300% higher risk for CANCER  and 200-300% higher risk for HEART   ATTACK  &  STROKE.   .....................................Marland Kitchen It is also associated with higher death rate at younger ages,  autoimmune diseases like Rheumatoid arthritis, Lupus, Multiple Sclerosis.    Also many other serious conditions, like depression, Alzheimer's Dementia, infertility, muscle aches, fatigue, fibromyalgia - just to name a few. ++++++++++++++++++++++ Recommend the book "The END of DIETING" by Dr Excell Seltzer  & the book "The END of DIABETES " by Dr Excell Seltzer At [email or web address] - get book & Audio CD's    Being diabetic has a  300% increased risk for heart attack, stroke, cancer, and alzheimer- type vascular dementia. It is very important that you work harder with diet by avoiding all foods that are white. Avoid white rice (brown & wild rice is OK), white  potatoes (sweetpotatoes in moderation is OK), White bread or wheat bread or anything made out of white flour like bagels, donuts, rolls, buns, biscuits, cakes, pastries, cookies, pizza crust, and pasta (made from white flour & egg whites) - vegetarian pasta or spinach or wheat pasta is OK. Multigrain breads like Arnold's or Pepperidge [employer], or multigrain sandwich thins or flatbreads.  Diet, exercise and weight loss can reverse and cure diabetes in the early stages.  Diet, exercise and weight loss is very important in the control and prevention of complications of diabetes which affects every system in your body, ie. Brain - dementia/stroke, eyes - glaucoma/blindness, heart - heart attack/heart failure,  kidneys - dialysis, stomach - gastric paralysis, intestines - malabsorption, nerves - severe painful neuritis, circulation - gangrene & loss of a leg(s), and finally cancer and Alzheimers.    I recommend avoid fried & greasy foods,  sweets/candy, white rice (brown or wild rice or Quinoa is OK), white potatoes (sweet potatoes are OK) - anything made from white flour - bagels, doughnuts, rolls, buns, biscuits,white and wheat breads, pizza crust and traditional pasta made of white flour & egg white(vegetarian pasta or spinach or wheat pasta is OK).  Multi-grain bread is OK - like multi-grain flat bread or sandwich thins. Avoid alcohol in excess. Exercise is also important.    Eat all the vegetables you want - avoid meat, especially red meat and dairy - especially cheese.  Cheese is the most concentrated form of trans-fats which is the worst thing to clog up our arteries. Veggie cheese is OK which can be found in the fresh produce section at [employer] or [employer] or [employer]  ++++++++++++++++++++++ DASH Eating Plan  DASH stands for "Dietary Approaches to Stop Hypertension."   The DASH eating plan is a healthy eating plan that has been shown to reduce high blood pressure (hypertension). Additional health  benefits may include reducing the risk of type 2 diabetes mellitus, heart disease, and stroke. The DASH eating plan may also help with weight loss. WHAT DO I NEED TO KNOW ABOUT THE DASH EATING PLAN? For the DASH eating plan, you will follow these general guidelines:  Choose foods with a percent daily value for sodium of less than 5% (as listed on the food label).  Use salt-free seasonings or herbs instead of table salt or sea salt.  Check with your health care provider or pharmacist before using salt substitutes.  Eat lower-sodium products, often labeled as "lower sodium" or "no salt added."  Eat fresh foods.  Eat more vegetables, fruits, and low-fat dairy products.  Choose whole grains. Look for the word "whole" as the first word in the ingredient list.  Choose fish   Limit sweets, desserts, sugars, and sugary drinks.  Choose heart-healthy fats.  Eat veggie cheese   Eat more home-cooked food and less restaurant, buffet, and fast food.  Limit fried foods.  Cook foods using methods other than frying.  Limit canned vegetables. If you do use them, rinse them well to decrease the sodium.  When eating at a restaurant, ask that your food be prepared with less salt, or no salt if possible.                      WHAT FOODS CAN I EAT? Read Dr Fara Olden Fuhrman's books on The End of Dieting & The End of Diabetes  Grains Whole grain or whole wheat bread. Brown rice. Whole grain or whole wheat pasta. Quinoa, bulgur, and whole grain cereals. Low-sodium cereals. Corn or whole wheat flour tortillas. Whole grain cornbread. Whole grain crackers. Low-sodium crackers.  Vegetables Fresh or frozen vegetables (raw, steamed, roasted, or grilled). Low-sodium or reduced-sodium tomato and vegetable juices. Low-sodium or reduced-sodium tomato sauce and paste. Low-sodium or reduced-sodium canned vegetables.   Fruits All fresh, canned (in natural juice), or frozen fruits.  Protein Products  All fish  and seafood.  Dried beans, peas, or lentils. Unsalted nuts and seeds. Unsalted canned beans.  Dairy Low-fat dairy products, such as skim or 1% milk, 2% or reduced-fat cheeses, low-fat ricotta or cottage cheese, or plain low-fat yogurt. Low-sodium or reduced-sodium cheeses.  Fats and  Oils Tub margarines without trans fats. Light or reduced-fat mayonnaise and salad dressings (reduced sodium). Avocado. Safflower, olive, or canola oils. Natural peanut or almond butter.  Other Unsalted popcorn and pretzels. The items listed above may not be a complete list of recommended foods or beverages. Contact your dietitian for more options.  ++++++++++++++++++++  WHAT FOODS ARE NOT RECOMMENDED? Grains/ White flour or wheat flour White bread. White pasta. White rice. Refined cornbread. Bagels and croissants. Crackers that contain trans fat.  Vegetables  Creamed or fried vegetables. Vegetables in a . Regular canned vegetables. Regular canned tomato sauce and paste. Regular tomato and vegetable juices.  Fruits Dried fruits. Canned fruit in light or heavy syrup. Fruit juice.  Meat and Other Protein Products Meat in general - RED meat & White meat.  Fatty cuts of meat. Ribs, chicken wings, all processed meats as bacon, sausage, bologna, salami, fatback, hot dogs, bratwurst and packaged luncheon meats.  Dairy Whole or 2% milk, cream, half-and-half, and cream cheese. Whole-fat or sweetened yogurt. Full-fat cheeses or blue cheese. Non-dairy creamers and whipped toppings. Processed cheese, cheese spreads, or cheese curds.  Condiments Onion and garlic salt, seasoned salt, table salt, and sea salt. Canned and packaged gravies. Worcestershire sauce. Tartar sauce. Barbecue sauce. Teriyaki sauce. Soy sauce, including reduced sodium. Steak sauce. Fish sauce. Oyster sauce. Cocktail sauce. Horseradish. Ketchup and mustard. Meat flavorings and tenderizers. Bouillon cubes. Hot sauce. Tabasco sauce. Marinades. Taco  seasonings. Relishes.  Fats and Oils Butter, stick margarine, lard, shortening and bacon fat. Coconut, palm kernel, or palm oils. Regular salad dressings.  Pickles and olives. Salted popcorn and pretzels.  The items listed above may not be a complete list of foods and beverages to avoid.

## 2020-07-03 LAB — CBC WITH DIFFERENTIAL/PLATELET
Absolute Monocytes: 790 cells/uL (ref 200–950)
Basophils Absolute: 40 cells/uL (ref 0–200)
Basophils Relative: 0.5 %
Eosinophils Absolute: 300 cells/uL (ref 15–500)
Eosinophils Relative: 3.8 %
HCT: 42.1 % (ref 38.5–50.0)
Hemoglobin: 13.7 g/dL (ref 13.2–17.1)
Lymphs Abs: 1849 cells/uL (ref 850–3900)
MCH: 29 pg (ref 27.0–33.0)
MCHC: 32.5 g/dL (ref 32.0–36.0)
MCV: 89 fL (ref 80.0–100.0)
MPV: 11 fL (ref 7.5–12.5)
Monocytes Relative: 10 %
Neutro Abs: 4922 cells/uL (ref 1500–7800)
Neutrophils Relative %: 62.3 %
Platelets: 171 10*3/uL (ref 140–400)
RBC: 4.73 10*6/uL (ref 4.20–5.80)
RDW: 13.1 % (ref 11.0–15.0)
Total Lymphocyte: 23.4 %
WBC: 7.9 10*3/uL (ref 3.8–10.8)

## 2020-07-03 LAB — COMPLETE METABOLIC PANEL WITH GFR
AG Ratio: 1.9 (calc) (ref 1.0–2.5)
ALT: 14 U/L (ref 9–46)
AST: 14 U/L (ref 10–35)
Albumin: 4.2 g/dL (ref 3.6–5.1)
Alkaline phosphatase (APISO): 61 U/L (ref 35–144)
BUN/Creatinine Ratio: 15 (calc) (ref 6–22)
BUN: 25 mg/dL (ref 7–25)
CO2: 27 mmol/L (ref 20–32)
Calcium: 9.4 mg/dL (ref 8.6–10.3)
Chloride: 106 mmol/L (ref 98–110)
Creat: 1.66 mg/dL — ABNORMAL HIGH (ref 0.70–1.11)
GFR, Est African American: 44 mL/min/{1.73_m2} — ABNORMAL LOW (ref >=60)
GFR, Est Non African American: 38 mL/min/{1.73_m2} — ABNORMAL LOW (ref >=60)
Globulin: 2.2 g/dL (calc) (ref 1.9–3.7)
Glucose, Bld: 115 mg/dL — ABNORMAL HIGH (ref 65–99)
Potassium: 4.6 mmol/L (ref 3.5–5.3)
Sodium: 140 mmol/L (ref 135–146)
Total Bilirubin: 0.6 mg/dL (ref 0.2–1.2)
Total Protein: 6.4 g/dL (ref 6.1–8.1)

## 2020-07-03 LAB — HEMOGLOBIN A1C
Hgb A1c MFr Bld: 6.7 % of total Hgb — ABNORMAL HIGH (ref <5.7)
Mean Plasma Glucose: 146 (calc)
eAG (mmol/L): 8.1 (calc)

## 2020-07-03 LAB — LIPID PANEL
Cholesterol: 134 mg/dL (ref <200)
HDL: 39 mg/dL — ABNORMAL LOW (ref >=40)
LDL Cholesterol (Calc): 75 mg/dL (calc)
Non-HDL Cholesterol (Calc): 95 mg/dL (calc) (ref <130)
Total CHOL/HDL Ratio: 3.4 (calc) (ref <5.0)
Triglycerides: 118 mg/dL (ref <150)

## 2020-07-03 LAB — MAGNESIUM: Magnesium: 2 mg/dL (ref 1.5–2.5)

## 2020-07-16 NOTE — Addendum Note (Signed)
Addended byGarnet Sierras A on: 07/16/2020 11:06 AM   Modules accepted: Orders

## 2020-07-17 ENCOUNTER — Encounter: Payer: Self-pay | Admitting: Adult Health Nurse Practitioner

## 2020-07-17 ENCOUNTER — Ambulatory Visit (INDEPENDENT_AMBULATORY_CARE_PROVIDER_SITE_OTHER): Payer: Medicare Other | Admitting: Adult Health Nurse Practitioner

## 2020-07-17 ENCOUNTER — Other Ambulatory Visit: Payer: Self-pay

## 2020-07-17 VITALS — BP 128/78 | HR 74 | Temp 98.6°F | Wt 246.0 lb

## 2020-07-17 DIAGNOSIS — D485 Neoplasm of uncertain behavior of skin: Secondary | ICD-10-CM

## 2020-07-17 NOTE — Progress Notes (Signed)
Chief Complaint: Patient presents for evaluation of skin lesions. Patient has erythematous, scaly lesions on trunk, changing in shape and size.  Indications:  Patient complains of skin tags that are recurrently irritated.    Exam: Suspicious lesion, pigmented uneven, Irritated 1cm, raised, dark brown, irregular, rough, increased size over 57months.  Right upper chest, lateral.  Anesthesia: Lidocaine 1% without epinephrine Shave biopsy completed (sent for pathology).    Lidocaine 1% w/o epinephrine: Second shave biopsy, Right upper chest. Bleeding was controlled with electrocautery.   Procedure Details   The risks, benefits, indications, potential complications, and alternatives were explained to the patient and verbal informed consent obtained. Using sterile iris scissors, multiple skin tags were snipped off at their bases after cleansing with Alcohol.    Liquid nitrogen was use in a  Double  / triple freeze and thaw technique. The patient tolerated the procedure well.   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 Seb. Keratoses,  irritated   Plan: 1. Patient educated that the area will begin to heal in approximately a week.  2. Warning signs of infection were reviewed.    3. Recommended that the patient use OTC acetaminophen as needed for pain.       Garnet Sierras, Laqueta Jean, DNP [facility] 07/17/2020  6:58 PM

## 2020-07-17 NOTE — Patient Instructions (Addendum)
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   -Warning signs of infection include: Increasing redness to surrounding tissue.   Increasing pain not relieved by over the counter tylenol or ibuprofen.  Area is hot to touch Unable to walk  -Recommended that the patient use acetaminophen (Tylenol) 1,000mg  every 8 hours as needed for pain OR and  ibuprofen (Motrin, Advil) 600mg  every 6 hours or 800mg  every 8 hours as needed for pain.  You may alternate tylenol and ibuprofen as they are different.  - This may need to be repeated every two or three weeks, up to three months or four treatments.   Call or return with new or worsening symptoms as discussed in appointment.  May contact office via phone [phone] or [place].

## 2020-07-18 DIAGNOSIS — L821 Other seborrheic keratosis: Secondary | ICD-10-CM | POA: Diagnosis not present

## 2020-07-25 DIAGNOSIS — I129 Hypertensive chronic kidney disease with stage 1 through stage 4 chronic kidney disease, or unspecified chronic kidney disease: Secondary | ICD-10-CM | POA: Diagnosis not present

## 2020-07-25 DIAGNOSIS — N184 Chronic kidney disease, stage 4 (severe): Secondary | ICD-10-CM | POA: Diagnosis not present

## 2020-07-25 DIAGNOSIS — M899 Disorder of bone, unspecified: Secondary | ICD-10-CM | POA: Diagnosis not present

## 2020-07-25 DIAGNOSIS — N1832 Chronic kidney disease, stage 3b: Secondary | ICD-10-CM | POA: Diagnosis not present

## 2020-08-05 ENCOUNTER — Other Ambulatory Visit: Payer: Self-pay | Admitting: Adult Health

## 2020-08-11 ENCOUNTER — Other Ambulatory Visit: Payer: Self-pay | Admitting: Internal Medicine

## 2020-08-21 ENCOUNTER — Other Ambulatory Visit: Payer: Self-pay | Admitting: Internal Medicine

## 2020-08-21 DIAGNOSIS — I10 Essential (primary) hypertension: Secondary | ICD-10-CM

## 2020-08-28 ENCOUNTER — Encounter: Payer: Self-pay | Admitting: Internal Medicine

## 2020-09-20 ENCOUNTER — Ambulatory Visit: Payer: Medicare Other | Admitting: Adult Health

## 2020-10-03 ENCOUNTER — Encounter: Payer: Self-pay | Admitting: Adult Health

## 2020-10-03 NOTE — Progress Notes (Signed)
ANNUAL MEDICARE WELLNESS AND 3 MONTH FOLLOW UP  Assessment and Plan:   Encounter for Annual Medicare Wellness Visit   Atherosclerosis of aorta Control blood pressure, cholesterol, glucose, increase exercise.   Hypertension At goal; continue medications Monitor blood pressure at home. Continue DASH diet.   Reminder to go to the ER if any CP, SOB, nausea, dizziness, severe HA, changes vision/speech, left arm numbness and tingling and jaw pain.  Cholesterol Continue medication: pravastatin 40 mg  LDL goal <70 Continue diet and exercise.  Check lipid panel.   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  Check A1C q69m Morbid Obesity - BMI 35+  with co morbidities (T2DM, htn, hyperlipidemia)  Long discussion about weight loss, diet, and exercise Discussed ideal weight for height (below 176) and initial weight goal (245) Patient will work on increasing vegetables and increase length and frequency of exercise Will follow up in 3 months  Vitamin D Def At goal at recent check; continue to recommend supplementation for goal of 70-100 Defer vitamin D level  Stage 3 chronic kidney disease due to type 2 diabetes mellitus (HCC) Increase fluids, avoid NSAIDS, monitor sugars, will monitor Encouraged hydration 65+ oz of water daily On ACEi, off of metformin   Diabetic sensory polyneuropathy ([place] No wounds at this time; decreased sensation Reminded patient to check feet daily for wounds Patient denies podiatry referral today   History of colonic polyps Patient refuses follow up colonoscopy, discussed cologuard which patient refuses at this time, will continue to recommend, also declines hemoccult despite strong  recommendation Continue ASA daily  Gluttony Discussed concerns with current diet and impact on long term outcomes related to diabetes and cardiovascular health. He does well with his wife fixing his plate and will resume this  Chronic bilateral knee pain Bone on bone per patient, establised with Dr. DEverardo Alltylenol/topicals only for pain due to kidney functions He is declining surgery  Consider bilateral knee sleeve supports for stability  Strongly recommended needs to lose weight    Orders Placed This Encounter  Procedures  . CBC with Differential/Platelet  . COMPLETE METABOLIC PANEL WITH GFR  . Magnesium  . Lipid panel  . TSH  . Hemoglobin A1c    Continue diet and meds as discussed. Further disposition pending results of labs. Discussed med's effects and SE's.   Over 30 minutes of exam, counseling, chart review, and critical decision making was performed.   Future Appointments  Date Time Provider [place] 07/02/2021  9:00 AM MGarnet Sierras NP [facility] None  10/07/2021 11:15 AM CLiane Comber NP [facility] None   During the course of the visit the patient was educated and counseled about appropriate screening and preventive services including:    Pneumococcal vaccine   Influenza vaccine  Td vaccine  Screening electrocardiogram  Colorectal cancer screening  Diabetes screening  Glaucoma screening  Nutrition counseling    -------------------------------------------------------------------------------------------------------------------------  HPI 86y.o. male  presents for 3 month follow up on hypertension, poorly controlled diabetes with CKD and neuropathy, morbid obesity, and AWV.   Recently 05/04/20 he had admission to hospital for sepsis due to UTI with AKI, recovered with IV rocephin.  He has bil knee pain, has seen Dr. DMarlou Sabut several years ago, bone  on bone but doesn't want surgery, uses topicals PRN that work well.   BMI is  Body mass index is 37.21 kg/m., he has not been working on diet, no exercise due to knee pain, can't even tolerate stationary cycle. He is admittedly poorly compliant with diet, eats too much, reports this week has cut out bread, is working towards weight loss. Does well when wife fixes plate.  Wt Readings from Last 3 Encounters:  10/04/20 252 lb (114.3 kg)  07/17/20 246 lb (111.6 kg)  07/02/20 242 lb (109.8 kg)   He does check BP occasionally (120s-130/70s), today their BP is BP: 138/70   He does not workout. He denies chest pain, shortness of breath, dizziness.   He has aortic atherosclerosis per Ct ab 2018.    He is on cholesterol medication (pravastatin 40 mg daily, up from 20 mg) and denies myalgias. His cholesterol is not at goal. The cholesterol last visit was:   Lab Results  Component Value Date   CHOL 134 07/02/2020   HDL 39 (L) 07/02/2020   LDLCALC 75 07/02/2020   TRIG 118 07/02/2020   CHOLHDL 3.4 07/02/2020    He has not been working on diet and exercise for DMII and has CKD stage 3 P neuropathy (decreased sensation) Hyperlipidemia on pravastatin On ASA, ACEi Off metformin due to low GFR  Glimepiride 4 mg BID, reduces to 1/2 tab when sugars are low  Meter: Freestyle He does check fasting, has been up, 180-210, states can get this down <140 with lifestyle Denies hyperglycemia, hypoglycemia , increased appetite, nausea, polydipsia and polyuria.  Last A1C in the office wasl:  Lab Results  Component Value Date   HGBA1C 6.7 (H) 07/02/2020    He has CKD III associated with T2DM monitored via this office:  Lab Results  Component Value Date   GFRNONAA 38 (L) 07/02/2020   GFRNONAA 33 (L) 05/14/2020   GFRNONAA 30 (L) 05/07/2020   Patient is on Vitamin D supplement and at goal:  Lab Results  Component Value Date   VD25OH 80 11/24/2019        Current Medications:  Current Outpatient Medications on File Prior to Visit  Medication Sig  . aspirin EC 81 MG tablet Take  81 mg by mouth at bedtime.   . Blood Glucose Monitoring Suppl (ONE TOUCH ULTRA 2) w/Device KIT check blood sugar 1 time daily-E11.21  . Cholecalciferol (VITAMIN D-3) 25 MCG (1000 UT) CAPS Take 1,000 Units by mouth at bedtime.  . Cinnamon 500 MG TABS Take 1,000 mg by mouth daily.   Marland Kitchen doxazosin (CARDURA) 8 MG tablet Take 1 tablet at Bedtime for BP & Prostate (Patient taking differently: Take 8 mg by mouth at bedtime.)  . famotidine (PEPCID) 40 MG tablet Take 1 tablet Daily for Indigestion & Heartburn (Patient taking differently: Take 40 mg by mouth daily.)  . glimepiride (AMARYL) 4 MG tablet Take      1/2 to 1 tablet       2 x /day       with Meals for Diabetes  . glucose blood (ONETOUCH ULTRA) test strip Check blood sugasr 1 time daily-E11.21  . Lancets (ONETOUCH ULTRASOFT) lancets Check blood sugar 1 time daily-E11.21  . lisinopril (ZESTRIL) 40 MG tablet Take     1 tablet     Daily       for BP & Diabetic Kidney Infection  . magnesium oxide (MAG-OX) 400 MG tablet Take 400 mg by mouth daily.  . Multiple  Vitamins-Minerals (ONE-A-DAY MENS 50+ ADVANTAGE) TABS Take 1 tablet by mouth daily with breakfast.  . Omega-3 Fatty Acids (FISH OIL) 1200 MG CAPS Take 1,200 mg by mouth daily.  . pravastatin (PRAVACHOL) 40 MG tablet Take     1 tablet      at Bedtime        for Cholesterol   No current facility-administered medications on file prior to visit.    Medical History:  Past Medical History:  Diagnosis Date  . AKI (acute kidney injury) ([place]) 04/20/2020  . Arthritis   . BPH (benign prostatic hyperplasia)   . Cataract   . CKD (chronic kidney disease), stage III ([place])   . Depression    "usually a happy go lucky"  . Diabetes mellitus   . Diverticulosis   . GERD (gastroesophageal reflux disease)    sometimes will take omeprazole  . Hyperlipidemia   . Hypertension   . Sepsis secondary to UTI ([place]) 05/04/2020   Allergies Allergies  Allergen Reactions  . Tramadol Other (See Comments)    "Made his  head feel funny"    SURGICAL HISTORY He  has a past surgical history that includes Tonsillectomy and adenoidectomy; Vein ligation and stripping; Knee arthroscopy; Colonoscopy (2005, 08/21/11); Fracture surgery; Cholecystectomy (N/A, 11/29/2015); and Cataract extraction, bilateral (Bilateral, 2020). FAMILY HISTORY His family history includes Diabetes in his brother; Prostate cancer in his brother. SOCIAL HISTORY He  reports that he has been smoking cigars. He has never used smokeless tobacco. He reports current alcohol use. He reports that he does not use drugs.  Immunization History  Administered Date(s) Administered  . DT (Pediatric) 08/31/2015  . Influenza Whole 05/24/2011, 08/06/2012, 07/07/2013  . Influenza, High Dose Seasonal PF 06/21/2014, 08/31/2015, 06/03/2016, 07/15/2017, 07/29/2018, 07/04/2019, 07/02/2020  . PFIZER SARS-COV-2 Vaccination 10/15/2019, 11/06/2019, 08/30/2020  . Pneumococcal Conjugate-13 06/21/2014  . Pneumococcal Polysaccharide-23 03/10/2005   Preventative care: Last colonoscopy: 11/2016, DONE per Dr. Carlean Purl  Prior vaccinations: TD or Tdap: 2016 Influenza: 06/2020 Pneumococcal: 2006 Prevnar13: 2015 Shingles/Zostavax: declines Covid 19: 3/3, 2021, pfizer  Eye exam:  Dr. Einar Gip in 2021, report requested, reminded to schedule for this Dentist: last 2021, upper dentures   Patient Care Team: Unk Pinto, MD as PCP - General (Internal Medicine) Marlou Sa, Tonna Corner, MD as Consulting Physician (Orthopedic Surgery)   MEDICARE WELLNESS OBJECTIVES: Physical activity: Current Exercise Habits: The patient does not participate in regular exercise at present, Exercise limited by: orthopedic condition(s) Cardiac risk factors: Cardiac Risk Factors include: advanced age (>48mn, >>55women);dyslipidemia;hypertension;obesity (BMI >30kg/m2);male gender;sedentary lifestyle;diabetes mellitus;smoking/ tobacco exposure Depression/mood screen:   [facility]  10/04/2020  Decreased Interest 0  Down, Depressed, Hopeless 0  PHQ - 2 Score 0    ADLs:  In your present state of health, do you have any difficulty performing the following activities: 10/04/2020 05/04/2020  Hearing? N N  Vision? N N  Difficulty concentrating or making decisions? N N  Walking or climbing stairs? Y Y  Comment can manage slowly with handrail, has knee athritis -  Dressing or bathing? N N  Doing errands, shopping? N N  Some recent data might be hidden     Cognitive Testing  Alert? Yes  Normal Appearance?Yes  Oriented to person? Yes  Place? Yes   Time? Yes  Recall of three objects?  Yes  Can perform simple calculations? Yes  Displays appropriate judgment?Yes  Can read the correct time from a watch face?Yes  EOL planning: Does Patient Have a Medical Advance Directive?: No  Would patient like information on creating a medical advance directive?: No - Patient declined   Review of Systems:  Review of Systems  Constitutional: Negative for malaise/fatigue and weight loss.  HENT: Negative for hearing loss and tinnitus.   Eyes: Negative for blurred vision and double vision.  Respiratory: Negative for cough, sputum production, shortness of breath and wheezing.   Cardiovascular: Negative for chest pain, palpitations, orthopnea, claudication and leg swelling.  Gastrointestinal: Negative for abdominal pain, blood in stool, constipation, diarrhea, heartburn, melena, nausea and vomiting.  Genitourinary: Negative.   Musculoskeletal: Positive for joint pain (bil knees bone on bone ). Negative for falls and myalgias.  Skin: Negative for rash.  Neurological: Positive for tingling (Intermittent in bilateral feet). Negative for dizziness, sensory change, weakness and headaches.  Endo/Heme/Allergies: Negative for polydipsia.  Psychiatric/Behavioral: Negative.  Negative for depression and memory loss. The patient is not nervous/anxious and does not have insomnia.   All other systems  reviewed and are negative.   Physical Exam: BP 138/70   Pulse 81   Temp 97.7 F (36.5 C)   Wt 252 lb (114.3 kg)   SpO2 96%   BMI 37.21 kg/m  Wt Readings from Last 3 Encounters:  10/04/20 252 lb (114.3 kg)  07/17/20 246 lb (111.6 kg)  07/02/20 242 lb (109.8 kg)   General Appearance: Well nourished, obese, in no apparent distress. Eyes: PERRLA, EOMs, conjunctiva no swelling or erythema Sinuses: No Frontal/maxillary tenderness ENT/Mouth: Ext aud canals clear, L TMs without erythema, bulging, R TM with extensive scarring, erythematous around bony structures (per the patient he has chronic drainage issue that has been cleared by ENT). No erythema, swelling, or exudate on post pharynx.  Tonsils not swollen or erythematous. Hearing normal.  Neck: Supple, thyroid normal.  Respiratory: Respiratory effort normal, BS equal bilaterally without rales, rhonchi, wheezing or stridor.  Cardio: RRR with no MRGs. 1+ peripheral pulses equally with 1+ non-pitting edema bilateral ankles.  Abdomen: Soft, rounded obese abdomen + BS.  Non tender, no guarding, rebound, palpable hernias or masses. Lymphatics: Non tender without lymphadenopathy.  Musculoskeletal: BIl knees with bony enlargement, crepitus, no effusion, 5/5 strength, antalgic gait Skin: Warm, dry without rashes, lesions, ecchymosis.  Neuro: Cranial nerves intact. No cerebellar symptoms. Sensation in heels diminished bilaterally to monofilament testing, R foot 5/10 Psych: Awake and oriented X 3, normal affect, Insight and Judgment appropriate.   Medicare Attestation I have personally reviewed: The patient's medical and social history Their use of alcohol, tobacco or illicit drugs Their current medications and supplements The patient's functional ability including ADLs,fall risks, home safety risks, cognitive, and hearing and visual impairment Diet and physical activities Evidence for depression or mood disorders  The patient's weight,  height, BMI, and visual acuity have been recorded in the chart.  I have made referrals, counseling, and provided education to the patient based on review of the above and I have provided the patient with a written personalized care plan for preventive services.    Izora Ribas, NP 12:57 PM [facility]

## 2020-10-04 ENCOUNTER — Ambulatory Visit (INDEPENDENT_AMBULATORY_CARE_PROVIDER_SITE_OTHER): Payer: Medicare Other | Admitting: Adult Health

## 2020-10-04 ENCOUNTER — Other Ambulatory Visit: Payer: Self-pay

## 2020-10-04 ENCOUNTER — Encounter: Payer: Self-pay | Admitting: Adult Health

## 2020-10-04 VITALS — BP 138/70 | HR 81 | Temp 97.7°F | Wt 252.0 lb

## 2020-10-04 DIAGNOSIS — E1142 Type 2 diabetes mellitus with diabetic polyneuropathy: Secondary | ICD-10-CM | POA: Diagnosis not present

## 2020-10-04 DIAGNOSIS — M25561 Pain in right knee: Secondary | ICD-10-CM

## 2020-10-04 DIAGNOSIS — R632 Polyphagia: Secondary | ICD-10-CM

## 2020-10-04 DIAGNOSIS — I700 Atherosclerosis of aorta: Secondary | ICD-10-CM | POA: Diagnosis not present

## 2020-10-04 DIAGNOSIS — E1169 Type 2 diabetes mellitus with other specified complication: Secondary | ICD-10-CM | POA: Diagnosis not present

## 2020-10-04 DIAGNOSIS — R6889 Other general symptoms and signs: Secondary | ICD-10-CM | POA: Diagnosis not present

## 2020-10-04 DIAGNOSIS — E785 Hyperlipidemia, unspecified: Secondary | ICD-10-CM | POA: Diagnosis not present

## 2020-10-04 DIAGNOSIS — M25562 Pain in left knee: Secondary | ICD-10-CM

## 2020-10-04 DIAGNOSIS — E669 Obesity, unspecified: Secondary | ICD-10-CM

## 2020-10-04 DIAGNOSIS — Z0001 Encounter for general adult medical examination with abnormal findings: Secondary | ICD-10-CM | POA: Diagnosis not present

## 2020-10-04 DIAGNOSIS — Z79899 Other long term (current) drug therapy: Secondary | ICD-10-CM

## 2020-10-04 DIAGNOSIS — G8929 Other chronic pain: Secondary | ICD-10-CM

## 2020-10-04 DIAGNOSIS — Z0000 Encounter for general adult medical examination without abnormal findings: Secondary | ICD-10-CM

## 2020-10-04 DIAGNOSIS — I10 Essential (primary) hypertension: Secondary | ICD-10-CM | POA: Diagnosis not present

## 2020-10-04 DIAGNOSIS — E66811 Obesity, class 1: Secondary | ICD-10-CM

## 2020-10-04 DIAGNOSIS — E559 Vitamin D deficiency, unspecified: Secondary | ICD-10-CM

## 2020-10-04 DIAGNOSIS — Z9119 Patient's noncompliance with other medical treatment and regimen: Secondary | ICD-10-CM

## 2020-10-04 DIAGNOSIS — N1832 Chronic kidney disease, stage 3b: Secondary | ICD-10-CM

## 2020-10-04 DIAGNOSIS — E1122 Type 2 diabetes mellitus with diabetic chronic kidney disease: Secondary | ICD-10-CM | POA: Diagnosis not present

## 2020-10-04 DIAGNOSIS — Z860100 Personal history of colon polyps, unspecified: Secondary | ICD-10-CM

## 2020-10-04 DIAGNOSIS — N183 Chronic kidney disease, stage 3 unspecified: Secondary | ICD-10-CM

## 2020-10-04 NOTE — Patient Instructions (Signed)
[name] , Thank you for taking time to come for your Medicare Wellness Visit. I appreciate your ongoing commitment to your health goals. Please review the following plan we discussed and let me know if I can assist you in the future.   These are the goals we discussed: Goals    . Fasting Blood Glucose <150     Need to check fasting glucose DAILY     . HEMOGLOBIN A1C < 7.0    . Weight (lb) < 240 lb (108.9 kg)       This is a list of the screening recommended for you and due dates:  Health Maintenance  Topic Date Due  . Eye exam for diabetics  11/09/2019  . Complete foot exam   06/20/2020  . Hemoglobin A1C  12/31/2020  . COVID-19 Vaccine (4 - Booster for Pfizer series) 02/28/2021  . Tetanus Vaccine  08/30/2025  . Flu Shot  Completed  . Pneumonia vaccines  Completed  . Colon Cancer Screening  Discontinued          High-Fiber Eating Plan Fiber, also called dietary fiber, is a type of carbohydrate. It is found foods such as fruits, vegetables, whole grains, and beans. A high-fiber diet can have many health benefits. Your health care provider may recommend a high-fiber diet to help:  Prevent constipation. Fiber can make your bowel movements more regular.  Lower your cholesterol.  Relieve the following conditions: ? Inflammation of veins in the anus (hemorrhoids). ? Inflammation of specific areas of the digestive tract (uncomplicated diverticulosis). ? A problem of the large intestine, also called the colon, that sometimes causes pain and diarrhea (irritable bowel syndrome, or IBS).  Prevent overeating as part of a weight-loss plan.  Prevent heart disease, type 2 diabetes, and certain cancers. What are tips for following this plan? Reading food labels  Check the nutrition facts label on food products for the amount of dietary fiber. Choose foods that have 5 grams of fiber or more per serving.  The goals for recommended daily fiber intake include: ? Men (age 43 or  younger): 34-38 g. ? Men (over age 23): 28-34 g. ? Women (age 22 or younger): 25-28 g. ? Women (over age 20): 22-25 g. Your daily fiber goal is _____________ g.   Shopping  Choose whole fruits and vegetables instead of processed forms, such as apple juice or applesauce.  Choose a wide variety of high-fiber foods such as avocados, lentils, oats, and kidney beans.  Read the nutrition facts label of the foods you choose. Be aware of foods with added fiber. These foods often have high sugar and sodium amounts per serving. Cooking  Use whole-grain flour for baking and cooking.  Cook with brown rice instead of white rice. Meal planning  Start the day with a breakfast that is high in fiber, such as a cereal that contains 5 g of fiber or more per serving.  Eat breads and cereals that are made with whole-grain flour instead of refined flour or white flour.  Eat brown rice, bulgur wheat, or millet instead of white rice.  Use beans in place of meat in soups, salads, and pasta dishes.  Be sure that half of the grains you eat each day are whole grains. General information  You can get the recommended daily intake of dietary fiber by: ? Eating a variety of fruits, vegetables, grains, nuts, and beans. ? Taking a fiber supplement if you are not able to take in enough fiber in  your diet. It is better to get fiber through food than from a supplement.  Gradually increase how much fiber you consume. If you increase your intake of dietary fiber too quickly, you may have bloating, cramping, or gas.  Drink plenty of water to help you digest fiber.  Choose high-fiber snacks, such as berries, raw vegetables, nuts, and popcorn. What foods should I eat? Fruits Berries. Pears. Apples. Oranges. Avocado. Prunes and raisins. Dried figs. Vegetables Sweet potatoes. Spinach. Kale. Artichokes. Cabbage. Broccoli. Cauliflower. Green peas. Carrots. Squash. Grains Whole-grain breads. Multigrain cereal. Oats  and oatmeal. Brown rice. Barley. Bulgur wheat. [place]. Quinoa. Bran muffins. Popcorn. Rye wafer crackers. Meats and other proteins Navy beans, kidney beans, and pinto beans. Soybeans. Split peas. Lentils. Nuts and seeds. Dairy Fiber-fortified yogurt. Beverages Fiber-fortified soy milk. Fiber-fortified orange juice. Other foods Fiber bars. The items listed above may not be a complete list of recommended foods and beverages. Contact a dietitian for more information. What foods should I avoid? Fruits Fruit juice. Cooked, strained fruit. Vegetables Fried potatoes. Canned vegetables. Well-cooked vegetables. Grains White bread. Pasta made with refined flour. White rice. Meats and other proteins Fatty cuts of meat. Fried chicken or fried fish. Dairy Milk. Yogurt. Cream cheese. Sour cream. Fats and oils Butters. Beverages Soft drinks. Other foods Cakes and pastries. The items listed above may not be a complete list of foods and beverages to avoid. Talk with your dietitian about what choices are best for you. Summary  Fiber is a type of carbohydrate. It is found in foods such as fruits, vegetables, whole grains, and beans.  A high-fiber diet has many benefits. It can help to prevent constipation, lower blood cholesterol, aid weight loss, and reduce your risk of heart disease, diabetes, and certain cancers.  Increase your intake of fiber gradually. Increasing fiber too quickly may cause cramping, bloating, and gas. Drink plenty of water while you increase the amount of fiber you consume.  The best sources of fiber include whole fruits and vegetables, whole grains, nuts, seeds, and beans. This information is not intended to replace advice given to you by your health care provider. Make sure you discuss any questions you have with your health care provider. Document Revised: 01/12/2020 Document Reviewed: 01/12/2020 Elsevier Patient Education  2021 [employer].

## 2020-10-05 LAB — CBC WITH DIFFERENTIAL/PLATELET
Absolute Monocytes: 864 cells/uL (ref 200–950)
Basophils Absolute: 43 cells/uL (ref 0–200)
Basophils Relative: 0.6 %
Eosinophils Absolute: 173 cells/uL (ref 15–500)
Eosinophils Relative: 2.4 %
HCT: 45 % (ref 38.5–50.0)
Hemoglobin: 14.8 g/dL (ref 13.2–17.1)
Lymphs Abs: 1642 cells/uL (ref 850–3900)
MCH: 29.2 pg (ref 27.0–33.0)
MCHC: 32.9 g/dL (ref 32.0–36.0)
MCV: 88.8 fL (ref 80.0–100.0)
MPV: 11.3 fL (ref 7.5–12.5)
Monocytes Relative: 12 %
Neutro Abs: 4478 cells/uL (ref 1500–7800)
Neutrophils Relative %: 62.2 %
Platelets: 159 10*3/uL (ref 140–400)
RBC: 5.07 10*6/uL (ref 4.20–5.80)
RDW: 12.8 % (ref 11.0–15.0)
Total Lymphocyte: 22.8 %
WBC: 7.2 10*3/uL (ref 3.8–10.8)

## 2020-10-05 LAB — LIPID PANEL
Cholesterol: 155 mg/dL (ref <200)
HDL: 42 mg/dL (ref >=40)
LDL Cholesterol (Calc): 79 mg/dL (calc)
Non-HDL Cholesterol (Calc): 113 mg/dL (calc) (ref <130)
Total CHOL/HDL Ratio: 3.7 (calc) (ref <5.0)
Triglycerides: 245 mg/dL — ABNORMAL HIGH (ref <150)

## 2020-10-05 LAB — COMPLETE METABOLIC PANEL WITH GFR
AG Ratio: 1.8 (calc) (ref 1.0–2.5)
ALT: 12 U/L (ref 9–46)
AST: 13 U/L (ref 10–35)
Albumin: 4.2 g/dL (ref 3.6–5.1)
Alkaline phosphatase (APISO): 62 U/L (ref 35–144)
BUN/Creatinine Ratio: 19 (calc) (ref 6–22)
BUN: 30 mg/dL — ABNORMAL HIGH (ref 7–25)
CO2: 28 mmol/L (ref 20–32)
Calcium: 9.5 mg/dL (ref 8.6–10.3)
Chloride: 103 mmol/L (ref 98–110)
Creat: 1.56 mg/dL — ABNORMAL HIGH (ref 0.70–1.11)
GFR, Est African American: 47 mL/min/{1.73_m2} — ABNORMAL LOW (ref >=60)
GFR, Est Non African American: 40 mL/min/{1.73_m2} — ABNORMAL LOW (ref >=60)
Globulin: 2.3 g/dL (calc) (ref 1.9–3.7)
Glucose, Bld: 201 mg/dL — ABNORMAL HIGH (ref 65–99)
Potassium: 5 mmol/L (ref 3.5–5.3)
Sodium: 139 mmol/L (ref 135–146)
Total Bilirubin: 0.5 mg/dL (ref 0.2–1.2)
Total Protein: 6.5 g/dL (ref 6.1–8.1)

## 2020-10-05 LAB — TSH: TSH: 3.08 mIU/L (ref 0.40–4.50)

## 2020-10-05 LAB — HEMOGLOBIN A1C
Hgb A1c MFr Bld: 9.3 % of total Hgb — ABNORMAL HIGH (ref <5.7)
Mean Plasma Glucose: 220 mg/dL
eAG (mmol/L): 12.2 mmol/L

## 2020-10-05 LAB — MAGNESIUM: Magnesium: 1.8 mg/dL (ref 1.5–2.5)

## 2020-10-11 NOTE — Progress Notes (Signed)
Patient is aware of lab results and instructions but states the has never taken ozempic. He only takes glipizide. Diabetic teaching has been scheduled. -e welch

## 2020-11-07 ENCOUNTER — Other Ambulatory Visit: Payer: Self-pay | Admitting: Internal Medicine

## 2020-11-09 NOTE — Progress Notes (Signed)
Diabetes education FOLLOW UP  Assessment and Plan:    Hypertension Atypically elevated; has been at goal recent checks Monitor blood pressure at home; call if persistent 140/80+  Continue DASH diet.   Reminder to go to the ER if any CP, SOB, nausea, dizziness, severe HA, changes vision/speech, left arm numbness and tingling and jaw pain.  Cholesterol Continue medication: pravastatin 40 mg  LDL goal <70 Continue diet and exercise.  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 He does well with his wife fixing his plate and will resume this   Continue diet and meds as discussed. Further disposition pending results of labs. Discussed med's  effects and SE's.   Over 30 minutes of exam, counseling, chart review, and critical decision making was performed.   Future Appointments  Date Time Provider [place]  07/02/2021  9:00 AM Garnet Sierras, NP [facility] None  10/07/2021 11:15 AM Liane Comber, NP [facility] None     -------------------------------------------------------------------------------------------------------------------------  HPI 86 y.o. male  presents for 1 month follow up on poorly controlled diabetes with CKD and neuropathy, morbid obesity.   BMI is Body mass index is 38.4 kg/m., he has not been working on diet, no exercise due to knee pain, can't even tolerate stationary cycle. He is admittedly poorly compliant with diet, eats too much, reports this week has cut out bread, is working towards weight loss. Does well when wife fixes plate but hasn't implemented this  Wt Readings from Last 3 Encounters:  11/12/20 260 lb (117.9 kg)  10/04/20 252 lb (114.3 kg)  07/17/20 246 lb (111.6 kg)   He does check BP occasionally (120s-130/70s), today their BP is BP: (!) 162/82   He does not workout. He denies chest pain, shortness of breath, dizziness.   He has aortic atherosclerosis per Ct ab 2018.    He is on cholesterol medication (pravastatin 40 mg daily, up from 20 mg) and denies myalgias. His cholesterol is not at goal. The cholesterol last visit was:   Lab Results  Component Value Date   CHOL 155 10/04/2020   HDL 42 10/04/2020   LDLCALC 79 10/04/2020   TRIG 245 (H) 10/04/2020   CHOLHDL 3.7 10/04/2020    He has not been working on diet and exercise for DMII and has CKD stage 3  P neuropathy (decreased sensation) Hyperlipidemia on pravastatin On ASA, ACEi Off metformin due to low GFR  Glimepiride 4 mg BID Meter: Freestyle He does check fasting, has been up, 220-270 Denies hyperglycemia, hypoglycemia , increased appetite, nausea, polydipsia and polyuria.  Last A1C in the office wasl:  Lab  Results  Component Value Date   HGBA1C 9.3 (H) 10/04/2020    He has CKD III associated with T2DM monitored via this office:  Lab Results  Component Value Date   GFRNONAA 40 (L) 10/04/2020   GFRNONAA 38 (L) 07/02/2020   GFRNONAA 33 (L) 05/14/2020   Patient is on Vitamin D supplement and at goal:  Lab Results  Component Value Date   VD25OH 80 11/24/2019        Current Medications:  Current Outpatient Medications on File Prior to Visit  Medication Sig  . aspirin EC 81 MG tablet Take 81 mg by mouth at bedtime.   . Blood Glucose Monitoring Suppl (ONE TOUCH ULTRA 2) w/Device KIT check blood sugar 1 time daily-E11.21  . Cholecalciferol (VITAMIN D-3) 25 MCG (1000 UT) CAPS Take 1,000 Units by mouth at bedtime.  . Cinnamon 500 MG TABS Take 1,000 mg by mouth daily.   Marland Kitchen doxazosin (CARDURA) 8 MG tablet Take 1 tablet at Bedtime for BP & Prostate (Patient taking differently: Take 8 mg by mouth at bedtime. Takes 1/2 tablet at bedtime)  . famotidine (PEPCID) 40 MG tablet Take 1 tablet Daily for Indigestion & Heartburn (Patient taking differently: Take 40 mg by mouth daily.)  . glimepiride (AMARYL) 4 MG tablet TAKE 1/2 TO 1 TABLET BY MOUTH TWICE DAILY WITH MEALS FOR DIABETES  . glucose blood (ONETOUCH ULTRA) test strip Check blood sugasr 1 time daily-E11.21  . Lancets (ONETOUCH ULTRASOFT) lancets Check blood sugar 1 time daily-E11.21  . lisinopril (ZESTRIL) 40 MG tablet Take     1 tablet     Daily       for BP & Diabetic Kidney Infection  . magnesium oxide (MAG-OX) 400 MG tablet Take 400 mg by mouth daily.  . Multiple Vitamins-Minerals (ONE-A-DAY MENS 50+ ADVANTAGE) TABS Take 1 tablet by mouth daily with breakfast.  . Omega-3 Fatty Acids (FISH OIL) 1200 MG CAPS Take 1,200 mg by mouth daily.  . pravastatin (PRAVACHOL) 40 MG tablet Take     1 tablet      at Bedtime        for Cholesterol   No current facility-administered medications on file prior to visit.    Medical History:  Past Medical  History:  Diagnosis Date  . AKI (acute kidney injury) ([place]) 04/20/2020  . Arthritis   . BPH (benign prostatic hyperplasia)   . Cataract   . CKD (chronic kidney disease), stage III ([place])   . Depression    "usually a happy go lucky"  . Diabetes mellitus   . Diverticulosis   . GERD (gastroesophageal reflux disease)    sometimes will take omeprazole  . Hyperlipidemia   . Hypertension   . Sepsis secondary to UTI ([place]) 05/04/2020   Allergies Allergies  Allergen Reactions  . Tramadol Other (See Comments)    "Made his head feel funny"    SURGICAL HISTORY He  has a past surgical history that includes Tonsillectomy and adenoidectomy; Vein ligation and stripping; Knee arthroscopy; Colonoscopy (2005, 08/21/11); Fracture surgery; Cholecystectomy (N/A, 11/29/2015); and Cataract extraction, bilateral (Bilateral, 2020). FAMILY HISTORY His family history includes Diabetes in his brother; Prostate cancer in his brother. SOCIAL HISTORY He  reports that he has been smoking cigars. He has never used smokeless tobacco. He reports current alcohol use. He reports that he does not use drugs.  Review of Systems:  Review of Systems  Constitutional: Negative for malaise/fatigue and weight loss.  HENT: Negative for hearing loss and tinnitus.   Eyes: Negative for blurred vision and double vision.  Respiratory: Negative for cough, sputum production, shortness of breath and wheezing.   Cardiovascular: Negative for chest pain, palpitations, orthopnea, claudication and leg swelling.  Gastrointestinal: Negative for abdominal pain, blood in stool, constipation, diarrhea, heartburn, melena, nausea and vomiting.  Genitourinary: Negative.   Musculoskeletal: Positive for joint pain (bil knees bone on bone ). Negative for falls and myalgias.  Skin: Negative for rash.  Neurological: Positive for tingling (Intermittent in bilateral feet). Negative for dizziness, sensory change, weakness and headaches.   Endo/Heme/Allergies: Negative for polydipsia.  Psychiatric/Behavioral: Negative.  Negative for depression and memory loss. The patient is not nervous/anxious and does not have insomnia.   All other systems reviewed and are negative.   Physical Exam: BP (!) 162/82   Pulse 82   Temp 97.7 F (36.5 C)   Wt 260 lb (117.9 kg)   SpO2 92%   BMI 38.40 kg/m  Wt Readings from Last 3 Encounters:  11/12/20 260 lb (117.9 kg)  10/04/20 252 lb (114.3 kg)  07/17/20 246 lb (111.6 kg)   General Appearance: Well nourished, obese, in no apparent distress. Eyes: PERRLA, EOMs, conjunctiva no swelling or erythema Sinuses: No Frontal/maxillary tenderness ENT/Mouth: Ext aud canals clear, L TMs without erythema, bulging, R TM with extensive scarring, erythematous around bony structures (per the patient he has chronic drainage issue that has been cleared by ENT). No erythema, swelling, or exudate on post pharynx.  Tonsils not swollen or erythematous. Hearing normal.  Neck: Supple, thyroid normal.  Respiratory: Respiratory effort normal, BS equal bilaterally without rales, rhonchi, wheezing or stridor.  Cardio: RRR with no MRGs. 1+ peripheral pulses equally with 1+ non-pitting edema bilateral ankles.  Abdomen: Soft, rounded obese abdomen + BS.  Non tender, no guarding, rebound, palpable hernias or masses. Lymphatics: Non tender without lymphadenopathy.  Musculoskeletal: BIl knees with bony enlargement, crepitus, no effusion, 5/5 strength, antalgic gait Skin: Warm, dry without rashes, lesions, ecchymosis.  Neuro: Cranial nerves intact. No cerebellar symptoms. Sensation in heels diminished bilaterally to monofilament testing, R foot 5/10 Psych: Awake and oriented X 3, normal affect, Insight and Judgment appropriate.   Izora Ribas, NP 11:09 AM [facility]

## 2020-11-12 ENCOUNTER — Encounter: Payer: Self-pay | Admitting: Adult Health

## 2020-11-12 ENCOUNTER — Other Ambulatory Visit: Payer: Self-pay

## 2020-11-12 ENCOUNTER — Ambulatory Visit (INDEPENDENT_AMBULATORY_CARE_PROVIDER_SITE_OTHER): Payer: Medicare Other | Admitting: Adult Health

## 2020-11-12 VITALS — BP 162/82 | HR 82 | Temp 97.7°F | Wt 260.0 lb

## 2020-11-12 DIAGNOSIS — N1832 Chronic kidney disease, stage 3b: Secondary | ICD-10-CM

## 2020-11-12 DIAGNOSIS — E1169 Type 2 diabetes mellitus with other specified complication: Secondary | ICD-10-CM | POA: Diagnosis not present

## 2020-11-12 DIAGNOSIS — E1122 Type 2 diabetes mellitus with diabetic chronic kidney disease: Secondary | ICD-10-CM | POA: Diagnosis not present

## 2020-11-12 DIAGNOSIS — E785 Hyperlipidemia, unspecified: Secondary | ICD-10-CM

## 2020-11-12 DIAGNOSIS — E1142 Type 2 diabetes mellitus with diabetic polyneuropathy: Secondary | ICD-10-CM | POA: Diagnosis not present

## 2020-11-12 DIAGNOSIS — Z79899 Other long term (current) drug therapy: Secondary | ICD-10-CM

## 2020-11-12 DIAGNOSIS — N183 Chronic kidney disease, stage 3 unspecified: Secondary | ICD-10-CM

## 2020-11-12 DIAGNOSIS — I10 Essential (primary) hypertension: Secondary | ICD-10-CM | POA: Diagnosis not present

## 2020-11-12 DIAGNOSIS — E669 Obesity, unspecified: Secondary | ICD-10-CM

## 2020-11-12 MED ORDER — OZEMPIC (0.25 OR 0.5 MG/DOSE) 2 MG/1.5ML ~~LOC~~ SOPN: PEN_INJECTOR | SUBCUTANEOUS | 1 refills | Status: DC

## 2020-11-12 NOTE — Patient Instructions (Addendum)
Goals    . Fasting Blood Glucose <150     Need to check fasting glucose DAILY     . HEMOGLOBIN A1C < 7.0    . Weight (lb) < 240 lb (108.9 kg)       Please check fasting sugar daily - first goal is <150 If frequent fasting sugars <130, reduce night time glimepiride to 1/2 tab If having any low sugars during the day, reduce morning glimepiride to 1/2 tab   Start ozempic 0.25 mg weekly (on Saturday) injected into stomach Continue at this dose for 4 weeks, then increase to 0.5 mg weekly       Semaglutide injection solution What is this medicine? SEMAGLUTIDE (Sem a GLOO tide) is used to improve blood sugar control in adults with type 2 diabetes. This medicine may be used with other diabetes medicines. This drug may also reduce the risk of heart attack or stroke if you have type 2 diabetes and risk factors for heart disease. This medicine may be used for other purposes; ask your health care provider or pharmacist if you have questions. COMMON BRAND NAME(S): OZEMPIC What should I tell my health care provider before I take this medicine? They need to know if you have any of these conditions:  endocrine tumors (MEN 2) or if someone in your family had these tumors  eye disease, vision problems  history of pancreatitis  kidney disease  stomach problems  thyroid cancer or if someone in your family had thyroid cancer  an unusual or allergic reaction to semaglutide, other medicines, foods, dyes, or preservatives  pregnant or trying to get pregnant  breast-feeding How should I use this medicine? This medicine is for injection under the skin of your upper leg (thigh), stomach area, or upper arm. It is given once every week (every 7 days). You will be taught how to prepare and give this medicine. Use exactly as directed. Take your medicine at regular intervals. Do not take it more often than directed. If you use this medicine with insulin, you should inject this medicine and the  insulin separately. Do not mix them together. Do not give the injections right next to each other. Change (rotate) injection sites with each injection. It is important that you put your used needles and syringes in a special sharps container. Do not put them in a trash can. If you do not have a sharps container, call your pharmacist or healthcare provider to get one. A special MedGuide will be given to you by the pharmacist with each prescription and refill. Be sure to read this information carefully each time. This drug comes with INSTRUCTIONS FOR USE. Ask your pharmacist for directions on how to use this drug. Read the information carefully. Talk to your pharmacist or health care provider if you have questions. Talk to your pediatrician regarding the use of this medicine in children. Special care may be needed. Overdosage: If you think you have taken too much of this medicine contact a poison control center or emergency room at once. NOTE: This medicine is only for you. Do not share this medicine with others. What if I miss a dose? If you miss a dose, take it as soon as you can within 5 days after the missed dose. Then take your next dose at your regular weekly time. If it has been longer than 5 days after the missed dose, do not take the missed dose. Take the next dose at your regular time. Do not take double  or extra doses. If you have questions about a missed dose, contact your health care provider for advice. What may interact with this medicine?  other medicines for diabetes Many medications may cause changes in blood sugar, these include:  alcohol containing beverages  antiviral medicines for HIV or AIDS  aspirin and aspirin-like drugs  certain medicines for blood pressure, heart disease, irregular heart beat  chromium  diuretics  male hormones, such as estrogens or progestins, birth control pills  fenofibrate  gemfibrozil  isoniazid  lanreotide  male hormones or  anabolic steroids  MAOIs like Carbex, Eldepryl, Marplan, Nardil, and Parnate  medicines for weight loss  medicines for allergies, asthma, cold, or cough  medicines for depression, anxiety, or psychotic disturbances  niacin  nicotine  NSAIDs, medicines for pain and inflammation, like ibuprofen or naproxen  octreotide  pasireotide  pentamidine  phenytoin  probenecid  quinolone antibiotics such as ciprofloxacin, levofloxacin, ofloxacin  some herbal dietary supplements  steroid medicines such as prednisone or cortisone  sulfamethoxazole; trimethoprim  thyroid hormones Some medications can hide the warning symptoms of low blood sugar (hypoglycemia). You may need to monitor your blood sugar more closely if you are taking one of these medications. These include:  beta-blockers, often used for high blood pressure or heart problems (examples include atenolol, metoprolol, propranolol)  clonidine  guanethidine  reserpine This list may not describe all possible interactions. Give your health care provider a list of all the medicines, herbs, non-prescription drugs, or dietary supplements you use. Also tell them if you smoke, drink alcohol, or use illegal drugs. Some items may interact with your medicine. What should I watch for while using this medicine? Visit your doctor or health care professional for regular checks on your progress. Drink plenty of fluids while taking this medicine. Check with your doctor or health care professional if you get an attack of severe diarrhea, nausea, and vomiting. The loss of too much body fluid can make it dangerous for you to take this medicine. A test called the HbA1C (A1C) will be monitored. This is a simple blood test. It measures your blood sugar control over the last 2 to 3 months. You will receive this test every 3 to 6 months. Learn how to check your blood sugar. Learn the symptoms of low and high blood sugar and how to manage  them. Always carry a quick-source of sugar with you in case you have symptoms of low blood sugar. Examples include hard sugar candy or glucose tablets. Make sure others know that you can choke if you eat or drink when you develop serious symptoms of low blood sugar, such as seizures or unconsciousness. They must get medical help at once. Tell your doctor or health care professional if you have high blood sugar. You might need to change the dose of your medicine. If you are sick or exercising more than usual, you might need to change the dose of your medicine. Do not skip meals. Ask your doctor or health care professional if you should avoid alcohol. Many nonprescription cough and cold products contain sugar or alcohol. These can affect blood sugar. Pens should never be shared. Even if the needle is changed, sharing may result in passing of viruses like hepatitis or HIV. Wear a medical ID bracelet or chain, and carry a card that describes your disease and details of your medicine and dosage times. Do not become pregnant while taking this medicine. Women should inform their doctor if they wish to become pregnant  or think they might be pregnant. There is a potential for serious side effects to an unborn child. Talk to your health care professional or pharmacist for more information. What side effects may I notice from receiving this medicine? Side effects that you should report to your doctor or health care professional as soon as possible:  allergic reactions like skin rash, itching or hives, swelling of the face, lips, or tongue  breathing problems  changes in vision  diarrhea that continues or is severe  lump or swelling on the neck  severe nausea  signs and symptoms of infection like fever or chills; cough; sore throat; pain or trouble passing urine  signs and symptoms of low blood sugar such as feeling anxious, confusion, dizziness, increased hunger, unusually weak or tired, sweating,  shakiness, cold, irritable, headache, blurred vision, fast heartbeat, loss of consciousness  signs and symptoms of kidney injury like trouble passing urine or change in the amount of urine  trouble swallowing  unusual stomach upset or pain  vomiting Side effects that usually do not require medical attention (report to your doctor or health care professional if they continue or are bothersome):  constipation  diarrhea  nausea  pain, redness, or irritation at site where injected  stomach upset This list may not describe all possible side effects. Call your doctor for medical advice about side effects. You may report side effects to FDA at 1-800-FDA-1088. Where should I keep my medicine? Keep out of the reach of children. Store unopened pens in a refrigerator between 2 and 8 degrees C (36 and 46 degrees F). Do not freeze. Protect from light and heat. After you first use the pen, it can be stored for 56 days at room temperature between 15 and 30 degrees C (59 and 86 degrees F) or in a refrigerator. Throw away your used pen after 56 days or after the expiration date, whichever comes first. Do not store your pen with the needle attached. If the needle is left on, medicine may leak from the pen. NOTE: This sheet is a summary. It may not cover all possible information. If you have questions about this medicine, talk to your doctor, pharmacist, or health care provider.  2021 Elsevier/Gold Standard (2019-05-24 09:41:51)

## 2020-11-13 ENCOUNTER — Ambulatory Visit: Payer: Medicare Other | Admitting: Adult Health

## 2020-11-18 ENCOUNTER — Other Ambulatory Visit: Payer: Self-pay | Admitting: Internal Medicine

## 2020-11-21 ENCOUNTER — Other Ambulatory Visit: Payer: Self-pay | Admitting: Internal Medicine

## 2020-11-21 ENCOUNTER — Other Ambulatory Visit: Payer: Self-pay | Admitting: Adult Health

## 2020-11-21 DIAGNOSIS — I10 Essential (primary) hypertension: Secondary | ICD-10-CM

## 2020-11-21 MED ORDER — LISINOPRIL 40 MG PO TABS: ORAL_TABLET | ORAL | 0 refills | Status: DC

## 2020-11-22 ENCOUNTER — Other Ambulatory Visit: Payer: Self-pay

## 2020-11-22 MED ORDER — OZEMPIC (0.25 OR 0.5 MG/DOSE) 2 MG/1.5ML ~~LOC~~ SOPN: PEN_INJECTOR | SUBCUTANEOUS | 1 refills | Status: DC

## 2021-01-17 ENCOUNTER — Encounter: Payer: Self-pay | Admitting: Internal Medicine

## 2021-01-17 DIAGNOSIS — Z6838 Body mass index (BMI) 38.0-38.9, adult: Secondary | ICD-10-CM | POA: Insufficient documentation

## 2021-01-17 NOTE — Progress Notes (Signed)
R  E  S  C  H  E  D  U  L  E  D                                                                                                                                                                                                 This very nice 86 y.o. MWM presents for 6 month follow up with HTN, HLD, T2_NIDDMand Vitamin D Deficiency.  Patient has GERD controlled on his meds.       Patient is treated for HTN (1985) & BP has been controlled at home. Today's  . Patient has had no complaints of any cardiac type chest pain, palpitations, dyspnea / orthopnea / PND, dizziness, claudication, or dependent edema.       Hyperlipidemia is controlled with diet & meds. Patient denies myalgias or other med SE's. Last Lipids were at goal except elevated Trig's:  Lab Results  Component Value Date   CHOL 155 10/04/2020   HDL 42 10/04/2020   LDLCALC 79 10/04/2020   TRIG 245 (H) 10/04/2020   CHOLHDL 3.7 10/04/2020     Patient has hx/o Gluttony and Morbid Obesity (BMI  38.4 ) and consequent T2_NIDDM w/CKD3b (GFR 40)  and has had no symptoms of reactive hypoglycemia, diabetic polys, paresthesias or visual blurring.   Patient admits Gluttony & poor compliance an last A1c was not at goal.   Lab Results  Component Value Date   HGBA1C 9.3 (H) 10/04/2020            Further, the patient also has history of Vitamin D Deficiency ("32" /2008) and supplements vitamin D without any suspected side-effects. Last vitamin D was at goal:  Lab Results  Component Value Date   VD25OH 16 11/24/2019    Current Outpatient Medications on File Prior to Visit  Medication Sig  . aspirin EC 81 MG tablet Take  at bedtime.   Marland Kitchen VITAMIN D 1000 u Take  at bedtime.  . Cinnamon 500 MG  Take 1,000 mg  daily.   Marland Kitchen doxazosin   8  MG   Takes 1/2 tablet at bedtime)  . famotidine  40 MG tablet Take 1 tablet Daily  . glimepiride 4 MG tablet TAKE 1/2 TO 1 TABLET TWICE DAILY  . lisinopril 40 MG tablet Take  1 tablet  Daily  . magnesium  400 MG  Take  daily.  . Multi-Vit-Minerals  Take 1 tablet daily   . Omega-3 FISH OIL 1200 MG  Take  daily.  . Pravastatin  40 MG tablet Take  1 tablet  at Bedtime   . Semaglutide,0.25 or 0.5MG /DOS, (OZEMPIC, 0.25 OR 0.5 MG/DOSE,) 2 MG/1.5ML SOPN Inject 0.25 mg into skin of stomach once weekly on Saturday. Increase to 0.5 mg weekly in 4 weeks.    Allergies  Allergen Reactions  . Tramadol Other (See Comments)    "Made his head feel funny"    PMHx:   Past Medical History:  Diagnosis Date  . AKI (acute kidney injury) ([place]) 04/20/2020  . Arthritis   . BPH (benign prostatic hyperplasia)   . Cataract   . CKD (chronic kidney disease), stage III ([place])   . Depression    "usually a happy go lucky"  . Diabetes mellitus   . Diverticulosis   . GERD (gastroesophageal reflux disease)    sometimes will take omeprazole  . Hyperlipidemia   . Hypertension   . Sepsis secondary to UTI ([place]) 05/04/2020    Immunization History  Administered Date(s) Administered  . DT  08/31/2015  . Influenza Whole 05/24/2011, 08/06/2012, 07/07/2013  . Influenza, High Dose Seasonal 07/29/2018, 07/04/2019, 07/02/2020  . PFIZER  SARS-COV-2 Vacc 10/15/2019, 11/06/2019, 08/30/2020  . Pneumococcal -13 06/21/2014  . Pneumococcal -23 03/10/2005    Past Surgical History:  Procedure Laterality Date  . CATARACT EXTRACTION, BILATERAL Bilateral 2020   Dr. Einar Gip  . CHOLECYSTECTOMY N/A 11/29/2015   Procedure: LAPAROSCOPIC CHOLECYSTECTOMY WITH INTRAOPERATIVE CHOLANGIOGRAM;  Surgeon: Erroll Luna, MD;  Location: [facility] OR;  Service: General;  Laterality: N/A;  . COLONOSCOPY  2005, 08/21/11   2005 tubulovillous adenoma and adenomas 2012 - 3 adenomas, largest 10 mm, diverticulosis  . FRACTURE SURGERY     ? right foot as a  child  . KNEE ARTHROSCOPY    . TONSILLECTOMY AND ADENOIDECTOMY    . VEIN LIGATION AND STRIPPING     left    FHx:    Reviewed / unchanged  SHx:    Reviewed / unchanged   Systems Review:  Constitutional: Denies fever, chills, wt changes, headaches, insomnia, fatigue, night sweats, change in appetite. Eyes: Denies redness, blurred vision, diplopia, discharge, itchy, watery eyes.  ENT: Denies discharge, congestion, post nasal drip, epistaxis, sore throat, earache, hearing loss, dental pain, tinnitus, vertigo, sinus pain, snoring.  CV: Denies chest pain, palpitations, irregular heartbeat, syncope, dyspnea, diaphoresis, orthopnea, PND, claudication or edema. Respiratory: denies cough, dyspnea, DOE, pleurisy, hoarseness, laryngitis, wheezing.  Gastrointestinal: Denies dysphagia, odynophagia, heartburn, reflux, water brash, abdominal pain or cramps, nausea, vomiting, bloating, diarrhea, constipation, hematemesis, melena, hematochezia  or hemorrhoids. Genitourinary: Denies dysuria, frequency, urgency, nocturia, hesitancy, discharge, hematuria or flank pain. Musculoskeletal: Denies arthralgias, myalgias, stiffness, jt. swelling, pain, limping or strain/sprain.  Skin: Denies pruritus, rash, hives, warts, acne, eczema or change in skin lesion(s). Neuro: No weakness, tremor, incoordination, spasms, paresthesia or pain. Psychiatric: Denies confusion, memory loss or sensory loss. Endo: Denies change in weight, skin or hair change.  Heme/Lymph: No excessive bleeding, bruising or enlarged lymph nodes.  Physical Exam  There were no vitals taken for this visit.  Appears  over nourished, well groomed  and in no distress.  Eyes: PERRLA, EOMs, conjunctiva no swelling or erythema. Sinuses: No frontal/maxillary tenderness ENT/Mouth: EAC's clear, TM's nl w/o erythema, bulging. Nares clear w/o erythema, swelling, exudates. Oropharynx clear without erythema or exudates. Oral hygiene is good. Tongue normal,  non obstructing. Hearing intact.  Neck: Supple. Thyroid not palpable. Car 2+/2+ without bruits, nodes or JVD. Chest: Respirations nl with BS clear & equal w/o rales, rhonchi, wheezing or  stridor.  Cor: Heart sounds normal w/ regular rate and rhythm without sig. murmurs, gallops, clicks or rubs. Peripheral pulses normal and equal  without edema.  Abdomen: Soft & bowel sounds normal. Non-tender w/o guarding, rebound, hernias, masses or organomegaly.  Lymphatics: Unremarkable.  Musculoskeletal: Full ROM all peripheral extremities, joint stability, 5/5 strength and normal gait.  Skin: Warm, dry without exposed rashes, lesions or ecchymosis apparent.  Neuro: Cranial nerves intact, reflexes equal bilaterally. Sensory-motor testing grossly intact. Tendon reflexes grossly intact.  Pysch: Alert & oriented x 3.  Insight and judgement nl & appropriate. No ideations.  Assessment and Plan:  1. Essential hypertension  - Continue medication, monitor blood pressure at home.  - Continue DASH diet.  Reminder to go to the ER if any CP,  SOB, nausea, dizziness, severe HA, changes vision/speech.  - CBC with Differential/Platelet - COMPLETE METABOLIC PANEL WITH GFR - Magnesium - TSH  2. Hyperlipidemia associated with type 2 diabetes mellitus ([place])  - Continue diet/meds, exercise,& lifestyle modifications.  - Continue monitor periodic cholesterol/liver & renal functions   - Lipid panel - TSH  3. Type 2 diabetes mellitus with stage 3b chronic kidney  disease, without long-term current use of insulin (HCC)  - Continue diet, exercise  - Lifestyle modifications.  - Monitor appropriate labs.  - Hemoglobin A1c - Insulin, random - PTH, intact and calcium  4. Vitamin D deficiency  - Continue supplementation.  - VITAMIN D 25 Hydroxyl  5. Aortic Atherosclerosis ([place]) by Abd CT scan 2018  - Lipid panel  6. Class 2 severe obesity due to excess calories with serious  comorbidity and  BMI of 38.0 to  38.9 in adult (HCC)  - TSH  7. Medication management  - CBC with Differential/Platelet - COMPLETE METABOLIC PANEL WITH GFR - Magnesium - Lipid panel - TSH - Hemoglobin A1c - Insulin, random - VITAMIN D 25 Hydroxyl - PTH, intact and calcium         Discussed  regular exercise, BP monitoring, weight control to achieve/maintain BMI less than 25 and discussed med and SE's. Recommended labs to assess and monitor clinical status with further disposition pending results of labs.  I discussed the assessment and treatment plan with the patient. The patient was provided an opportunity to ask questions and all were answered. The patient agreed with the plan and demonstrated an understanding of the instructions.  I provided over 30 minutes of exam, counseling, chart review and  complex critical decision making.         The patient was advised to call back or seek an in-person evaluation if the symptoms worsen or if the condition fails to improve as anticipated.   Kirtland Bouchard, MD

## 2021-01-18 ENCOUNTER — Ambulatory Visit: Payer: Medicare Other | Admitting: Internal Medicine

## 2021-01-18 ENCOUNTER — Encounter: Payer: Self-pay | Admitting: Internal Medicine

## 2021-01-18 DIAGNOSIS — I700 Atherosclerosis of aorta: Secondary | ICD-10-CM

## 2021-01-18 DIAGNOSIS — E559 Vitamin D deficiency, unspecified: Secondary | ICD-10-CM

## 2021-01-18 DIAGNOSIS — E1169 Type 2 diabetes mellitus with other specified complication: Secondary | ICD-10-CM

## 2021-01-18 DIAGNOSIS — I10 Essential (primary) hypertension: Secondary | ICD-10-CM

## 2021-01-18 DIAGNOSIS — Z79899 Other long term (current) drug therapy: Secondary | ICD-10-CM

## 2021-01-18 DIAGNOSIS — N1832 Chronic kidney disease, stage 3b: Secondary | ICD-10-CM

## 2021-01-23 DIAGNOSIS — E1122 Type 2 diabetes mellitus with diabetic chronic kidney disease: Secondary | ICD-10-CM | POA: Diagnosis not present

## 2021-01-23 DIAGNOSIS — N1832 Chronic kidney disease, stage 3b: Secondary | ICD-10-CM | POA: Diagnosis not present

## 2021-01-23 DIAGNOSIS — I129 Hypertensive chronic kidney disease with stage 1 through stage 4 chronic kidney disease, or unspecified chronic kidney disease: Secondary | ICD-10-CM | POA: Diagnosis not present

## 2021-01-23 DIAGNOSIS — E785 Hyperlipidemia, unspecified: Secondary | ICD-10-CM | POA: Diagnosis not present

## 2021-02-04 NOTE — Progress Notes (Signed)
Future Appointments  Date Time Provider [place]  02/05/2021  4:00 PM Unk Pinto, MD [facility] None  07/02/2021  9:00 AM Garnet Sierras, NP [facility] None  10/07/2021 11:15 AM Liane Comber, NP [facility] None    History of Present Illness:       This very nice 86 y.o. MWM presents for 6 month follow up with HTN, HLD, T2_NIDDMand Vitamin D Deficiency.  Patient has GERD controlled on his meds. Abd CT Scan in 2018 showed Aortic Atherosclerosis.       Patient is treated for HTN  (1985) & BP has been controlled at home. Today's BP is elevated at 170/8 4.  He reports all home BP's have been normal.  Patient has had no complaints of any cardiac type chest pain, palpitations, dyspnea / orthopnea / PND, dizziness, claudication, or dependent edema.       Hyperlipidemia is controlled with diet & meds. Patient denies myalgias or other med SE's. Last Lipids were at goal except elevated Trig's:  Lab Results  Component Value Date   CHOL 155 10/04/2020   HDL 42 10/04/2020   LDLCALC 79 10/04/2020   TRIG 245 (H) 10/04/2020   CHOLHDL 3.7 10/04/2020     Patient has hx/o Gluttony and Morbid Obesity (BMI  38.4 ) and consequentT2_NIDDMw/CKD3b (GFR 40)and has had no symptoms of reactive hypoglycemia, diabetic polys, paresthesias or visual blurring. Last A1c was not at goal:  Lab Results  Component Value Date   HGBA1C 9.3 (H) 10/04/2020        Further, the patient also has history of Vitamin D Deficiency ("32" /2008) and supplements vitamin D without any suspected side-effects. Last vitamin D was at goal:  Lab Results  Component Value Date   VD25OH 100 11/24/2019     Current Outpatient Medications on File Prior to Visit  Medication Sig  . aspirin EC 81 MG tablet Take t bedtime.   Marland Kitchen VITAMIN D 1000 u  Take 1,000 Units at bedtime.  . Cinnamon 500 MG  Take 1,000 mg  daily.   Marland Kitchen doxazosin  8 MG tablet Takes 1/2 tablet at bedtime  . famotidine  40 MG tablet Take 1  tablet Daily   . Lisinopril 40 MG tablet Take  1 tablet  Daily  . magnesium  400 MG tablet Take daily.  . ONE-A-DAY MENS 50+  Take 1 tablet daily with breakfast.  . Omega-3 FISH OIL 1200 MG  Take  daily.  . pravastatin  40 MG tablet Take  1 tablet  at Bedtime    . Semaglutide 0.5MG /DOSE,   Inject 0.5 mg into skin of stomach once weekly on Saturday.)    PMHx:   Past Medical History:  Diagnosis Date  . AKI (acute kidney injury) ([place]) 04/20/2020  . Arthritis   . BPH (benign prostatic hyperplasia)   . Cataract   . CKD (chronic kidney disease), stage III ([place])   . Depression    "usually a happy go lucky"  . Diabetes mellitus   . Diverticulosis   . GERD (gastroesophageal reflux disease)    sometimes will take omeprazole  . Hyperlipidemia   . Hypertension   . Sepsis secondary to UTI ([place]) 05/04/2020    Immunization History  Administered Date(s) Administered  . DT (Pediatric) 08/31/2015  . Influenza Whole 05/24/2011, 08/06/2012, 07/07/2013  . Influenza, High Dose   07/29/2018, 07/04/2019, 07/02/2020  . PFIZER  SARS-COV-2 Vacc 10/15/2019, 11/06/2019, 08/30/2020  . Pneumococcal -13 06/21/2014  .  Pneumococcal -23 03/10/2005    Past Surgical History:  Procedure Laterality Date  . CATARACT EXTRACTION, BILATERAL Bilateral 2020   Dr. Einar Gip  . CHOLECYSTECTOMY N/A 11/29/2015   Procedure: LAPAROSCOPIC CHOLECYSTECTOMY WITH INTRAOPERATIVE CHOLANGIOGRAM;  Surgeon: Erroll Luna, MD;  Location: [facility];  Service: General;  Laterality: N/A;  . COLONOSCOPY  2005, 08/21/11   2005 tubulovillous adenoma and adenomas 2012 - 3 adenomas, largest 10 mm, diverticulosis  . FRACTURE SURGERY     ? right foot as a child  . KNEE ARTHROSCOPY    . TONSILLECTOMY AND ADENOIDECTOMY    . VEIN LIGATION AND STRIPPING     left    FHx:    Reviewed / unchanged  SHx:    Reviewed / unchanged   Systems Review:  Constitutional: Denies fever, chills, wt changes, headaches, insomnia, fatigue, night sweats,  change in appetite. Eyes: Denies redness, blurred vision, diplopia, discharge, itchy, watery eyes.  ENT: Denies discharge, congestion, post nasal drip, epistaxis, sore throat, earache, hearing loss, dental pain, tinnitus, vertigo, sinus pain, snoring.  CV: Denies chest pain, palpitations, irregular heartbeat, syncope, dyspnea, diaphoresis, orthopnea, PND, claudication or edema. Respiratory: denies cough, dyspnea, DOE, pleurisy, hoarseness, laryngitis, wheezing.  Gastrointestinal: Denies dysphagia, odynophagia, heartburn, reflux, water brash, abdominal pain or cramps, nausea, vomiting, bloating, diarrhea, constipation, hematemesis, melena, hematochezia  or hemorrhoids. Genitourinary: Denies dysuria, frequency, urgency, nocturia, hesitancy, discharge, hematuria or flank pain. Musculoskeletal: Denies arthralgias, myalgias, stiffness, jt. swelling, pain, limping or strain/sprain.  Skin: Denies pruritus, rash, hives, warts, acne, eczema or change in skin lesion(s). Neuro: No weakness, tremor, incoordination, spasms, paresthesia or pain. Psychiatric: Denies confusion, memory loss or sensory loss. Endo: Denies change in weight, skin or hair change.  Heme/Lymph: No excessive bleeding, bruising or enlarged lymph nodes.  Physical Exam  BP (!) 170/84   Pulse 78   Temp 97.7 F (36.5 C)   Ht 5\' 9"  (1.753 m)   Wt 256 lb (116.1 kg)   SpO2 94%   BMI 37.80 kg/m   Appears  well nourished, well groomed  and in no distress.  Eyes: PERRLA, EOMs, conjunctiva no swelling or erythema. Sinuses: No frontal/maxillary tenderness ENT/Mouth: EAC's clear, TM's nl w/o erythema, bulging. Nares clear w/o erythema, swelling, exudates. Oropharynx clear without erythema or exudates. Oral hygiene is good. Tongue normal, non obstructing. Hearing intact.  Neck: Supple. Thyroid not palpable. Car 2+/2+ without bruits, nodes or JVD. Chest: Respirations nl with BS clear & equal w/o rales, rhonchi, wheezing or stridor.  Cor:  Heart sounds normal w/ regular rate and rhythm without sig. murmurs, gallops, clicks or rubs. Peripheral pulses normal and equal  without edema.  Abdomen: Soft & bowel sounds normal. Non-tender w/o guarding, rebound, hernias, masses or organomegaly.  Lymphatics: Unremarkable.  Musculoskeletal: Full ROM all peripheral extremities, joint stability, 5/5 strength and normal gait.  Skin: Warm, dry without exposed rashes, lesions or ecchymosis apparent.  Neuro: Cranial nerves intact, reflexes equal bilaterally. Sensory-motor testing grossly intact. Tendon reflexes grossly intact.  Pysch: Alert & oriented x 3.  Insight and judgement nl & appropriate. No ideations.  Assessment and Plan:  1. Essential hypertension  - Continue medication, monitor blood pressure at home.  - Continue DASH diet.  Reminder to go to the ER if any CP,  SOB, nausea, dizziness, severe HA, changes vision/speech.  - CBC with Differential/Platelet - COMPLETE METABOLIC PANEL WITH GFR - Magnesium - TSH  2. Hyperlipidemia associated with type 2 diabetes mellitus ([place])  - Continue diet/meds, exercise,& lifestyle modifications.  -  Continue monitor periodic cholesterol/liver & renal functions   - Lipid panel - TSH  3. Type 2 diabetes mellitus with stage 3b chronic kidney  disease, without long-term current use of insulin (HCC)  - Continue diet, exercise  - Lifestyle modifications.  - Monitor appropriate labs  - Hemoglobin A1c - Insulin, random  4. Vitamin D deficiency  - Continue supplementation.  - VITAMIN D 25 Hydroxy   5. Aortic Atherosclerosis ([place]) by Abd CT scan 2018  - Lipid panel  6. Medication management  - CBC with Differential/Platelet - COMPLETE METABOLIC PANEL WITH GFR - Magnesium - Lipid panel - TSH - Hemoglobin A1c - Insulin, random - VITAMIN D 25 Hydroxy         Discussed  regular exercise, BP monitoring, weight control to achieve/maintain BMI less than 25 and discussed med and SE's.  Recommended labs to assess and monitor clinical status with further disposition pending results of labs.  I discussed the assessment and treatment plan with the patient. The patient was provided an opportunity to ask questions and all were answered. The patient agreed with the plan and demonstrated an understanding of the instructions.  I provided over 30 minutes of exam, counseling, chart review and  complex critical decision making.        The patient was advised to call back or seek an in-person evaluation if the symptoms worsen or if the condition fails to improve as anticipated.   Kirtland Bouchard, MD

## 2021-02-05 ENCOUNTER — Other Ambulatory Visit: Payer: Self-pay | Admitting: Adult Health

## 2021-02-05 ENCOUNTER — Ambulatory Visit (INDEPENDENT_AMBULATORY_CARE_PROVIDER_SITE_OTHER): Payer: Medicare Other | Admitting: Internal Medicine

## 2021-02-05 ENCOUNTER — Encounter: Payer: Self-pay | Admitting: Internal Medicine

## 2021-02-05 ENCOUNTER — Other Ambulatory Visit: Payer: Self-pay

## 2021-02-05 VITALS — BP 170/84 | HR 78 | Temp 97.7°F | Ht 69.0 in | Wt 256.0 lb

## 2021-02-05 DIAGNOSIS — E559 Vitamin D deficiency, unspecified: Secondary | ICD-10-CM | POA: Diagnosis not present

## 2021-02-05 DIAGNOSIS — I700 Atherosclerosis of aorta: Secondary | ICD-10-CM

## 2021-02-05 DIAGNOSIS — N1832 Chronic kidney disease, stage 3b: Secondary | ICD-10-CM

## 2021-02-05 DIAGNOSIS — E1169 Type 2 diabetes mellitus with other specified complication: Secondary | ICD-10-CM

## 2021-02-05 DIAGNOSIS — E785 Hyperlipidemia, unspecified: Secondary | ICD-10-CM

## 2021-02-05 DIAGNOSIS — I10 Essential (primary) hypertension: Secondary | ICD-10-CM | POA: Diagnosis not present

## 2021-02-05 DIAGNOSIS — R609 Edema, unspecified: Secondary | ICD-10-CM

## 2021-02-05 DIAGNOSIS — Z79899 Other long term (current) drug therapy: Secondary | ICD-10-CM

## 2021-02-05 DIAGNOSIS — E1122 Type 2 diabetes mellitus with diabetic chronic kidney disease: Secondary | ICD-10-CM | POA: Diagnosis not present

## 2021-02-05 MED ORDER — FUROSEMIDE 40 MG PO TABS: ORAL_TABLET | ORAL | 1 refills | Status: DC

## 2021-02-05 NOTE — Patient Instructions (Signed)

## 2021-02-06 LAB — COMPLETE METABOLIC PANEL WITH GFR
AG Ratio: 2.1 (calc) (ref 1.0–2.5)
ALT: 16 U/L (ref 9–46)
AST: 14 U/L (ref 10–35)
Albumin: 4.2 g/dL (ref 3.6–5.1)
Alkaline phosphatase (APISO): 68 U/L (ref 35–144)
BUN/Creatinine Ratio: 14 (calc) (ref 6–22)
BUN: 26 mg/dL — ABNORMAL HIGH (ref 7–25)
CO2: 29 mmol/L (ref 20–32)
Calcium: 9.6 mg/dL (ref 8.6–10.3)
Chloride: 103 mmol/L (ref 98–110)
Creat: 1.81 mg/dL — ABNORMAL HIGH (ref 0.70–1.11)
GFR, Est African American: 39 mL/min/{1.73_m2} — ABNORMAL LOW (ref >=60)
GFR, Est Non African American: 34 mL/min/{1.73_m2} — ABNORMAL LOW (ref >=60)
Globulin: 2 g/dL (calc) (ref 1.9–3.7)
Glucose, Bld: 180 mg/dL — ABNORMAL HIGH (ref 65–99)
Potassium: 5.1 mmol/L (ref 3.5–5.3)
Sodium: 139 mmol/L (ref 135–146)
Total Bilirubin: 0.3 mg/dL (ref 0.2–1.2)
Total Protein: 6.2 g/dL (ref 6.1–8.1)

## 2021-02-06 LAB — CBC WITH DIFFERENTIAL/PLATELET
Absolute Monocytes: 891 cells/uL (ref 200–950)
Basophils Absolute: 29 cells/uL (ref 0–200)
Basophils Relative: 0.4 %
Eosinophils Absolute: 248 cells/uL (ref 15–500)
Eosinophils Relative: 3.4 %
HCT: 43.8 % (ref 38.5–50.0)
Hemoglobin: 14.4 g/dL (ref 13.2–17.1)
Lymphs Abs: 1898 cells/uL (ref 850–3900)
MCH: 29.6 pg (ref 27.0–33.0)
MCHC: 32.9 g/dL (ref 32.0–36.0)
MCV: 89.9 fL (ref 80.0–100.0)
MPV: 10.9 fL (ref 7.5–12.5)
Monocytes Relative: 12.2 %
Neutro Abs: 4234 cells/uL (ref 1500–7800)
Neutrophils Relative %: 58 %
Platelets: 169 10*3/uL (ref 140–400)
RBC: 4.87 10*6/uL (ref 4.20–5.80)
RDW: 12.5 % (ref 11.0–15.0)
Total Lymphocyte: 26 %
WBC: 7.3 10*3/uL (ref 3.8–10.8)

## 2021-02-06 LAB — VITAMIN D 25 HYDROXY (VIT D DEFICIENCY, FRACTURES): Vit D, 25-Hydroxy: 78 ng/mL (ref 30–100)

## 2021-02-06 LAB — MAGNESIUM: Magnesium: 2 mg/dL (ref 1.5–2.5)

## 2021-02-06 LAB — LIPID PANEL
Cholesterol: 139 mg/dL (ref <200)
HDL: 38 mg/dL — ABNORMAL LOW (ref >=40)
LDL Cholesterol (Calc): 71 mg/dL (calc)
Non-HDL Cholesterol (Calc): 101 mg/dL (calc) (ref <130)
Total CHOL/HDL Ratio: 3.7 (calc) (ref <5.0)
Triglycerides: 207 mg/dL — ABNORMAL HIGH (ref <150)

## 2021-02-06 LAB — TSH: TSH: 3.29 mIU/L (ref 0.40–4.50)

## 2021-02-06 LAB — INSULIN, RANDOM: Insulin: 45.8 u[IU]/mL — ABNORMAL HIGH

## 2021-02-06 LAB — HEMOGLOBIN A1C
Hgb A1c MFr Bld: 8.3 % of total Hgb — ABNORMAL HIGH (ref <5.7)
Mean Plasma Glucose: 192 mg/dL
eAG (mmol/L): 10.6 mmol/L

## 2021-02-06 NOTE — Progress Notes (Signed)
============================================================ ============================================================  -  Kidney functions still Stable 3b and appear mildly dehydrated          - So it's very important to drink more fluids /water                                                                                                                                                          Recommend 6 bottles (16 oz) = 96 oz approximates 100 oz /day ! ============================================================ ============================================================  -  Total Chol = 139  / LDL Chol = 70   -   Both  Excellent   - Very low risk for Heart Attack  / Stroke ============================================================ ============================================================  -  Triglycerides (   207   ) or fats in blood are too high  (goal is less than 150)    - Recommend avoid fried & greasy foods,  sweets / candy,   - Avoid white rice  (brown or wild rice or Quinoa is OK),   - Avoid white potatoes  (sweet potatoes are OK)   - Avoid anything made from white flour  - bagels, doughnuts, rolls, buns, biscuits, white and   wheat breads, pizza crust and traditional  pasta made of white flour & egg white  - (vegetarian pasta or spinach or wheat pasta is OK).    - Multi-grain bread is OK - like multi-grain flat bread or  sandwich thins.   - Avoid alcohol in excess.   - Exercise is also important. ============================================================ ============================================================  -  A1c = 8.3%  still way too high  Your blood sugar and A1c are elevated.    Being diabetic has a  300% increased risk for heart attack,  stroke, cancer, and alzheimer- type vascular dementia.   It is very important that you work harder with diet by  avoiding all foods that are white except chicken,   fish & calliflower.  -  Avoid white rice  (brown & wild rice is OK),   - Avoid white potatoes  (sweet potatoes in moderation is OK),   White bread or wheat bread or anything made out of   white flour like bagels, donuts, rolls, buns, biscuits, cakes,  - pastries, cookies, pizza crust, and pasta (made from  white flour & egg whites)   - vegetarian pasta or spinach or wheat pasta is OK.  - Multigrain breads like Arnold's, Pepperidge [employer] or   multigrain sandwich thins or high fiber breads like   [place] bread or "Dave's Killer" breads that are  4 to 5 grams fiber per slice !  are best.    -Brain - dementia/stroke,  - eyes - glaucoma/blindness,  - heart - heart attack/heart failure,  - kidneys - dialysis,  - stomach - gastric paralysis,  - intestines - malabsorption,  -  nerves - severe painful neuritis,  - circulation - gangrene & loss of a leg(s)  - and finally  . . . . . . . . . . . . . . . . . .    - cancer and Alzheimers. ============================================================ ============================================================  -  Vitamin D = 78 - Excellent  ============================================================ ============================================================  -  All Else - CBC - Electrolytes - Liver - Magnesium & Thyroid    - all  Normal / OK ===========================================================                     -

## 2021-02-25 ENCOUNTER — Other Ambulatory Visit: Payer: Self-pay | Admitting: Internal Medicine

## 2021-02-25 MED ORDER — OZEMPIC (0.25 OR 0.5 MG/DOSE) 2 MG/1.5ML ~~LOC~~ SOPN: PEN_INJECTOR | SUBCUTANEOUS | 1 refills | Status: DC

## 2021-02-26 DIAGNOSIS — N1832 Chronic kidney disease, stage 3b: Secondary | ICD-10-CM | POA: Diagnosis not present

## 2021-02-27 ENCOUNTER — Other Ambulatory Visit: Payer: Self-pay | Admitting: Internal Medicine

## 2021-02-28 ENCOUNTER — Other Ambulatory Visit: Payer: Self-pay | Admitting: Internal Medicine

## 2021-02-28 MED ORDER — OZEMPIC (1 MG/DOSE) 4 MG/3ML ~~LOC~~ SOPN: PEN_INJECTOR | SUBCUTANEOUS | 0 refills | Status: DC

## 2021-03-03 ENCOUNTER — Other Ambulatory Visit: Payer: Self-pay | Admitting: Internal Medicine

## 2021-03-12 ENCOUNTER — Other Ambulatory Visit: Payer: Self-pay | Admitting: Internal Medicine

## 2021-03-26 DIAGNOSIS — N1832 Chronic kidney disease, stage 3b: Secondary | ICD-10-CM | POA: Diagnosis not present

## 2021-04-02 ENCOUNTER — Other Ambulatory Visit: Payer: Self-pay | Admitting: Internal Medicine

## 2021-04-02 DIAGNOSIS — I10 Essential (primary) hypertension: Secondary | ICD-10-CM

## 2021-04-02 DIAGNOSIS — N138 Other obstructive and reflux uropathy: Secondary | ICD-10-CM

## 2021-04-02 DIAGNOSIS — N401 Enlarged prostate with lower urinary tract symptoms: Secondary | ICD-10-CM

## 2021-04-02 MED ORDER — DOXAZOSIN MESYLATE 4 MG PO TABS: ORAL_TABLET | ORAL | 3 refills | Status: DC

## 2021-05-01 ENCOUNTER — Telehealth: Payer: Self-pay

## 2021-05-01 ENCOUNTER — Other Ambulatory Visit: Payer: Self-pay

## 2021-05-01 DIAGNOSIS — E1122 Type 2 diabetes mellitus with diabetic chronic kidney disease: Secondary | ICD-10-CM

## 2021-05-01 DIAGNOSIS — N1832 Chronic kidney disease, stage 3b: Secondary | ICD-10-CM

## 2021-05-01 MED ORDER — TRULICITY 1.5 MG/0.5ML ~~LOC~~ SOAJ: 1.5000 mg | SUBCUTANEOUS | 2 refills | Status: DC

## 2021-05-01 NOTE — Telephone Encounter (Signed)
Ozempic costing too much, in the donut hole. 4 month supply of Trulicity-samples given to patient per provider. Paperwork for patient assistance filled out.

## 2021-05-14 ENCOUNTER — Other Ambulatory Visit: Payer: Self-pay | Admitting: Internal Medicine

## 2021-06-08 ENCOUNTER — Other Ambulatory Visit: Payer: Self-pay | Admitting: Adult Health

## 2021-06-26 ENCOUNTER — Encounter: Payer: Medicare Other | Admitting: Adult Health Nurse Practitioner

## 2021-07-02 ENCOUNTER — Encounter: Payer: Medicare Other | Admitting: Adult Health Nurse Practitioner

## 2021-07-08 NOTE — Progress Notes (Signed)
COMPLETE PHYSICAL  Assessment and Plan:   Edwin Osborne was seen today for annual exam.  Diagnoses and all orders for this visit:  Encounter for annual physical exam Yearly  Essential hypertension Continue current medications: Lisinopril 55m, Lasix 40 mg qd and cardura 464mMonitor blood pressure at home; call if consistently over 130/80 Continue DASH diet.   Reminder to go to the ER if any CP, SOB, nausea, dizziness, severe HA, changes vision/speech, left arm numbness and tingling and jaw pain. -     CBC with Differential/Platelet -     COMPLETE METABOLIC PANEL WITH GFR -     Magnesium  Hyperlipidemia associated with type 2 diabetes mellitus (HCC) Continue medications:Pravastatin 4067maily Discussed dietary and exercise modifications Low fat diet LDL goal <70 -     Lipid panel  Thoracic aortic atherosclerosis (HCC) Control blood pressure, lipids and glucose Disscused lifestyle modifications, diet & exercise Continue to monitor  Type 2 diabetes mellitus with stage 3b chronic kidney disease, without long-term current use of insulin (HCC) Continue glimepiride - 4 mg AM, add 1/2 tab in evenings if fasting glucose persistently 150+  Off of metformin due to advancing CKD Long discussion about dietary choices and physical activity Perform daily foot/skin check, notify office of any concerning changes.  Check A1C Q3m 36m   Hemoglobin A1c  Diabetic sensory polyneuropathy (HCC)[place] exam completed today. Sensation in tact  Stage 3 chronic kidney disease due to type 2 diabetes mellitus (HCC) Increase fluids  Avoid NSAIDS Blood pressure control Monitor sugars  Will continue to monitor  BPH with obstruction/lower urinary tract symptoms Doing well at this time Continue medications: Doxazosin 4mg 21ml continue to monitor PSA   Vitamin D deficiency Continue supplementation to maintain goal of 70-100 Taking Vitamin D 1,000 IU daily Vitamin D level  Class 2 severe obesity due  to excess calories with serious comorbidity and body mass index (BMI) of 37.0 to 37.9 in adult (HCC)[facility] and exercise modifications Patient will work on increasing vegetables and increase length and frequency of exercise Will follow up in 3 months  Medication management Magnesium  Screening for Hematuria/proteinuria  Microalbumin/creatinine urine ratio  Urine routine with reflex microscopic  Screening for ischemic heart disease  EKG  Need for influenza vaccination -     Flu vaccine HIGH DOSE PF Discussed vaccination and side effcts  Need for Pneumococcal Vaccine  Vaccinated with Prevnar 20   Continue diet and meds as discussed. Further disposition pending results of labs. Discussed med's effects and SE's.   Over 30 minutes of exam, counseling, chart review, and critical decision making was performed.   Future Appointments  Date Time Provider [place]6/2023 11:00 AM CorbeLiane ComberGAAM-GAAIM None  07/09/2022  9:00 AM Randolf Sansoucie,Magda BernheimGAAM-GAAIM None   During the course of the visit the patient was educated and counseled about appropriate screening and preventive services including:   Pneumococcal vaccine  Influenza vaccine Td vaccine Screening electrocardiogram Colorectal cancer screening Diabetes screening Glaucoma screening Nutrition counseling    -------------------------------------------------------------------------------------------------------------------------  HPI 86 y.62 male  presents for 6 week follow up on HTN, poorly controlled DMII with CKD and neuropathy, morbid obesity.    Pt continues to have knee pain bilaterally. He is not on current medication for pain.    BMI is Body mass index is 37.42 kg/m., he has not been working on diet, no exercise due to knee pain. He gained weight and wife has put him  back on diet, she fixes his plate.  Wt Readings from Last 3 Encounters:  07/09/21 253 lb 6.4 oz (114.9 kg)  02/05/21  256 lb (116.1 kg)  11/12/20 260 lb (117.9 kg)   He does not check his BP at home, today their BP is BP: 130/80  BP Readings from Last 3 Encounters:  07/09/21 130/80  02/05/21 (!) 170/84  11/12/20 (!) 162/82     He does workout. He denies chest pain, shortness of breath, dizziness.    He is on cholesterol medication (pravastatin 40 mg daily) and denies myalgias. His cholesterol is not at goal. The cholesterol last visit was:   Lab Results  Component Value Date   CHOL 139 02/05/2021   HDL 38 (L) 02/05/2021   LDLCALC 71 02/05/2021   TRIG 207 (H) 02/05/2021   CHOLHDL 3.7 02/05/2021    He has not been working on diet and exercise for DMII and has CKD stage 3 P neuropathy, Bilateral lower extremities Hyperlipidemia on pravastatin On ASA, ACEi Off metformin due to GFR <45 Glimeprid 50m in AM, half tablt if evning glucos <150. Meter: Freestyle Checks daily  Denies hyperglycemia, hypoglycemia , increased appetite, nausea, polydipsia and polyuria.  Last A1C in the office wasl:  Lab Results  Component Value Date   HGBA1C 8.3 (H) 02/05/2021    He has CKD III associated with T2DM monitored via this office:  Lab Results  Component Value Date   GFRNONAA 34 (L) 02/05/2021   Patient is on Vitamin D supplement and at goal:  Lab Results  Component Value Date   VD25OH 78 02/05/2021        Current Medications:  Current Outpatient Medications on File Prior to Visit  Medication Sig   aspirin EC 81 MG tablet Take 81 mg by mouth at bedtime.    Blood Glucose Monitoring Suppl (ONE TOUCH ULTRA 2) w/Device KIT check blood sugar 1 time daily-E11.21   Cholecalciferol (VITAMIN D-3) 25 MCG (1000 UT) CAPS Take 1,000 Units by mouth at bedtime.   Cinnamon 500 MG TABS Take 1,000 mg by mouth daily.    doxazosin (CARDURA) 4 MG tablet Take  1 tablet  at Bedtime  for BP & Prostate   Dulaglutide (TRULICITY) 1.5 MVZ/8.5YISOPN Inject 1.5 mg into the skin once a week.   furosemide (LASIX) 40 MG tablet  Take 1 tablet Daily for BP and Fluid Retention / Ankle Swelling   glimepiride (AMARYL) 4 MG tablet TAKE 1/2 TO 1 TABLET BY MOUTH TWICE DAILY WITH MEALS FOR DIABETES   glucose blood (ONETOUCH ULTRA) test strip USE TO CHECK BLOOD SUGAR ONCE DAILY.   Lancets (ONETOUCH ULTRASOFT) lancets Check blood sugar 1 time daily-E11.21   lisinopril (ZESTRIL) 40 MG tablet Take  1 tablet  Daily  for BP & Diabetic Kidney Protection   magnesium oxide (MAG-OX) 400 MG tablet Take 400 mg by mouth daily.   Multiple Vitamins-Minerals (ONE-A-DAY MENS 50+ ADVANTAGE) TABS Take 1 tablet by mouth daily with breakfast.   Omega-3 Fatty Acids (FISH OIL) 1200 MG CAPS Take 1,200 mg by mouth daily.   pravastatin (PRAVACHOL) 40 MG tablet TAKE 1 TABLET BY MOUTH AT BEDTIME FOR CHOLESTEROL   famotidine (PEPCID) 40 MG tablet Take 1 tablet Daily for Indigestion & Heartburn (Patient taking differently: Take 40 mg by mouth daily.)   No current facility-administered medications on file prior to visit.    Medical History:  Past Medical History:  Diagnosis Date   [place]) [place]  Arthritis    BPH (benign prostatic hyperplasia)    Cataract    CKD (chronic kidney disease), stage III (HCC)    Depression    "usually a happy go lucky"   Diabetes mellitus    Diverticulosis    GERD (gastroesophageal reflux disease)    sometimes will take omeprazole   Hyperlipidemia    Hypertension    Sepsis secondary to UTI ([place]) 05/04/2020   Allergies Allergies  Allergen Reactions   Tramadol Other (See Comments)    "Made his head feel funny"    SURGICAL HISTORY He  has a past surgical history that includes Tonsillectomy and adenoidectomy; Vein ligation and stripping; Knee arthroscopy; Colonoscopy (2005, 08/21/11); Fracture surgery; Cholecystectomy (N/A, 11/29/2015); and Cataract extraction, bilateral (Bilateral, 2020). FAMILY HISTORY His family history includes Diabetes in his brother; Prostate cancer in his  brother. SOCIAL HISTORY He  reports that he has been smoking cigars. He has never used smokeless tobacco. He reports current alcohol use. He reports that he does not use drugs.  Immunization History  Administered Date(s) Administered   DT (Pediatric) 08/31/2015   Influenza Whole 05/24/2011, 08/06/2012, 07/07/2013   Influenza, High Dose Seasonal PF 06/21/2014, 08/31/2015, 06/03/2016, 07/15/2017, 07/29/2018, 07/04/2019, 07/02/2020   PFIZER(Purple Top)SARS-COV-2 Vaccination 10/15/2019, 11/06/2019, 08/30/2020   Pneumococcal Conjugate-13 06/21/2014   Pneumococcal Polysaccharide-23 03/10/2005   Preventative care: Last colonoscopy: 2012 due 2015 but will not have another, declines, also won't do hemoccult.  Discussed with patient.  Prior vaccinations: TD or Tdap: 2016 Influenza: 06/2019 Pneumococcal: 07/09/2021 Prevnar13: 2015 Shingles/Zostavax: declines  Eye exam:2022 Dentist: 2022 has full upper partials.   Patient Care Team: Unk Pinto, MD as PCP - General (Internal Medicine) Marlou Sa Tonna Corner, MD as Consulting Physician (Orthopedic Surgery)    Review of Systems  Constitutional:  Negative for chills and fever.  HENT:  Negative for congestion, hearing loss, sinus pain, sore throat and tinnitus.   Eyes:  Negative for blurred vision and double vision.  Respiratory:  Positive for shortness of breath (on exertion). Negative for cough, hemoptysis, sputum production and wheezing.   Cardiovascular:  Negative for chest pain, palpitations and leg swelling.  Gastrointestinal:  Negative for abdominal pain, constipation, diarrhea, heartburn, nausea and vomiting.  Genitourinary:  Negative for dysuria and urgency.       Gets up 2 times at night  Musculoskeletal:  Positive for joint pain (knees). Negative for back pain, falls, myalgias and neck pain.  Skin:  Negative for rash.  Neurological:  Negative for dizziness, tingling, tremors, weakness and headaches.  Endo/Heme/Allergies:   Bruises/bleeds easily.  Psychiatric/Behavioral:  Negative for depression and suicidal ideas. The patient is not nervous/anxious and does not have insomnia.      Physical Exam: BP 130/80   Pulse 90   Temp 97.7 F (36.5 C)   Ht 5' 9" (1.753 m)   Wt 253 lb 6.4 oz (114.9 kg)   SpO2 93%   BMI 37.42 kg/m  Wt Readings from Last 3 Encounters:  07/09/21 253 lb 6.4 oz (114.9 kg)  02/05/21 256 lb (116.1 kg)  11/12/20 260 lb (117.9 kg)   General Appearance: Well nourished, obese, in no apparent distress. Eyes: PERRLA, EOMs, conjunctiva no swelling or erythema Sinuses: No Frontal/maxillary tenderness ENT/Mouth: Ext aud canals clear, L TMs without erythema, bulging, R TM with extensive scarring, erythematous around bony structures (per the patient he has chronic drainage issue that has been cleared by ENT). No erythema, swelling, or exudate on post pharynx.  Tonsils not swollen or erythematous.  Hearing normal.  Neck: Supple, thyroid normal.  Respiratory: Respiratory effort normal, BS equal bilaterally without rales, rhonchi, wheezing or stridor.  Cardio: RRR with no MRGs. 1+ peripheral pulses equally with 1+ non-pitting edema bilateral ankles.  Abdomen: Soft, rounded obese abdomen + BS.  Non tender, no guarding, rebound, palpable hernias or masses. Lymphatics: Non tender without lymphadenopathy.  Musculoskeletal: BIl knees with bony enlargement, crepitus, no effusion, 5/5 strength, antalgic gait Skin: Warm, dry without rashes, lesions, ecchymosis. Flaking skin of feet bilaterally Neuro: Cranial nerves intact. No cerebellar symptoms. Sensation in heels diminished bilaterally to monofilament testing.  Psych: Awake and oriented X 3, normal affect, Insight and Judgment appropriate.    EKG: Normal sinus rhythm, AV Block I   Magda Bernheim ANP-C  [facility]  07/09/2021

## 2021-07-09 ENCOUNTER — Encounter: Payer: Self-pay | Admitting: Nurse Practitioner

## 2021-07-09 ENCOUNTER — Ambulatory Visit (INDEPENDENT_AMBULATORY_CARE_PROVIDER_SITE_OTHER): Payer: Medicare Other | Admitting: Nurse Practitioner

## 2021-07-09 ENCOUNTER — Other Ambulatory Visit: Payer: Self-pay

## 2021-07-09 VITALS — BP 130/80 | HR 90 | Temp 97.7°F | Ht 69.0 in | Wt 253.4 lb

## 2021-07-09 DIAGNOSIS — I10 Essential (primary) hypertension: Secondary | ICD-10-CM

## 2021-07-09 DIAGNOSIS — E1122 Type 2 diabetes mellitus with diabetic chronic kidney disease: Secondary | ICD-10-CM

## 2021-07-09 DIAGNOSIS — Z1389 Encounter for screening for other disorder: Secondary | ICD-10-CM

## 2021-07-09 DIAGNOSIS — Z0000 Encounter for general adult medical examination without abnormal findings: Secondary | ICD-10-CM | POA: Diagnosis not present

## 2021-07-09 DIAGNOSIS — Z136 Encounter for screening for cardiovascular disorders: Secondary | ICD-10-CM | POA: Diagnosis not present

## 2021-07-09 DIAGNOSIS — N401 Enlarged prostate with lower urinary tract symptoms: Secondary | ICD-10-CM

## 2021-07-09 DIAGNOSIS — Z23 Encounter for immunization: Secondary | ICD-10-CM

## 2021-07-09 DIAGNOSIS — E1169 Type 2 diabetes mellitus with other specified complication: Secondary | ICD-10-CM

## 2021-07-09 DIAGNOSIS — Z0001 Encounter for general adult medical examination with abnormal findings: Secondary | ICD-10-CM

## 2021-07-09 DIAGNOSIS — E1142 Type 2 diabetes mellitus with diabetic polyneuropathy: Secondary | ICD-10-CM

## 2021-07-09 DIAGNOSIS — E559 Vitamin D deficiency, unspecified: Secondary | ICD-10-CM

## 2021-07-09 DIAGNOSIS — N1832 Chronic kidney disease, stage 3b: Secondary | ICD-10-CM | POA: Diagnosis not present

## 2021-07-09 DIAGNOSIS — Z79899 Other long term (current) drug therapy: Secondary | ICD-10-CM

## 2021-07-09 DIAGNOSIS — Z6837 Body mass index (BMI) 37.0-37.9, adult: Secondary | ICD-10-CM

## 2021-07-09 DIAGNOSIS — N138 Other obstructive and reflux uropathy: Secondary | ICD-10-CM

## 2021-07-09 DIAGNOSIS — N183 Chronic kidney disease, stage 3 unspecified: Secondary | ICD-10-CM

## 2021-07-09 DIAGNOSIS — I700 Atherosclerosis of aorta: Secondary | ICD-10-CM

## 2021-07-09 NOTE — Patient Instructions (Signed)

## 2021-07-10 LAB — MICROALBUMIN / CREATININE URINE RATIO
Creatinine, Urine: 129 mg/dL (ref 20–320)
Microalb Creat Ratio: 16 mcg/mg creat (ref <30)
Microalb, Ur: 2.1 mg/dL

## 2021-07-10 LAB — COMPLETE METABOLIC PANEL WITH GFR
AG Ratio: 1.8 (calc) (ref 1.0–2.5)
ALT: 17 U/L (ref 9–46)
AST: 17 U/L (ref 10–35)
Albumin: 4.2 g/dL (ref 3.6–5.1)
Alkaline phosphatase (APISO): 56 U/L (ref 35–144)
BUN/Creatinine Ratio: 17 (calc) (ref 6–22)
BUN: 29 mg/dL — ABNORMAL HIGH (ref 7–25)
CO2: 28 mmol/L (ref 20–32)
Calcium: 9.4 mg/dL (ref 8.6–10.3)
Chloride: 101 mmol/L (ref 98–110)
Creat: 1.75 mg/dL — ABNORMAL HIGH (ref 0.70–1.22)
Globulin: 2.4 g/dL (calc) (ref 1.9–3.7)
Glucose, Bld: 134 mg/dL — ABNORMAL HIGH (ref 65–99)
Potassium: 4.7 mmol/L (ref 3.5–5.3)
Sodium: 140 mmol/L (ref 135–146)
Total Bilirubin: 0.5 mg/dL (ref 0.2–1.2)
Total Protein: 6.6 g/dL (ref 6.1–8.1)
eGFR: 38 mL/min/{1.73_m2} — ABNORMAL LOW (ref >=60)

## 2021-07-10 LAB — URINALYSIS, ROUTINE W REFLEX MICROSCOPIC
Bilirubin Urine: NEGATIVE
Glucose, UA: NEGATIVE
Hgb urine dipstick: NEGATIVE
Ketones, ur: NEGATIVE
Leukocytes,Ua: NEGATIVE
Nitrite: NEGATIVE
Protein, ur: NEGATIVE
Specific Gravity, Urine: 1.016 (ref 1.001–1.035)
pH: 5.5 (ref 5.0–8.0)

## 2021-07-10 LAB — HEMOGLOBIN A1C
Hgb A1c MFr Bld: 8.8 % of total Hgb — ABNORMAL HIGH (ref <5.7)
Mean Plasma Glucose: 206 mg/dL
eAG (mmol/L): 11.4 mmol/L

## 2021-07-10 LAB — CBC WITH DIFFERENTIAL/PLATELET
Absolute Monocytes: 850 cells/uL (ref 200–950)
Basophils Absolute: 31 cells/uL (ref 0–200)
Basophils Relative: 0.4 %
Eosinophils Absolute: 203 cells/uL (ref 15–500)
Eosinophils Relative: 2.6 %
HCT: 44.4 % (ref 38.5–50.0)
Hemoglobin: 14.8 g/dL (ref 13.2–17.1)
Lymphs Abs: 1513 cells/uL (ref 850–3900)
MCH: 30.2 pg (ref 27.0–33.0)
MCHC: 33.3 g/dL (ref 32.0–36.0)
MCV: 90.6 fL (ref 80.0–100.0)
MPV: 10.8 fL (ref 7.5–12.5)
Monocytes Relative: 10.9 %
Neutro Abs: 5203 cells/uL (ref 1500–7800)
Neutrophils Relative %: 66.7 %
Platelets: 185 10*3/uL (ref 140–400)
RBC: 4.9 10*6/uL (ref 4.20–5.80)
RDW: 12.3 % (ref 11.0–15.0)
Total Lymphocyte: 19.4 %
WBC: 7.8 10*3/uL (ref 3.8–10.8)

## 2021-07-10 LAB — PSA: PSA: 1.75 ng/mL (ref <=4.00)

## 2021-07-10 LAB — LIPID PANEL
Cholesterol: 140 mg/dL (ref <200)
HDL: 37 mg/dL — ABNORMAL LOW (ref >=40)
LDL Cholesterol (Calc): 69 mg/dL (calc)
Non-HDL Cholesterol (Calc): 103 mg/dL (calc) (ref <130)
Total CHOL/HDL Ratio: 3.8 (calc) (ref <5.0)
Triglycerides: 244 mg/dL — ABNORMAL HIGH (ref <150)

## 2021-07-10 LAB — VITAMIN D 25 HYDROXY (VIT D DEFICIENCY, FRACTURES): Vit D, 25-Hydroxy: 62 ng/mL (ref 30–100)

## 2021-07-10 LAB — TSH: TSH: 3.02 mIU/L (ref 0.40–4.50)

## 2021-07-10 LAB — MAGNESIUM: Magnesium: 2.1 mg/dL (ref 1.5–2.5)

## 2021-07-30 ENCOUNTER — Telehealth: Payer: Self-pay | Admitting: Internal Medicine

## 2021-07-30 NOTE — Chronic Care Management (AMB) (Signed)
  Chronic Care Management   Note  07/30/2021 Name: KAELEN CAUGHLIN MRN: [id number] DOB: September 25, 1934  Pattricia Boss is a 86 y.o. year old male who is a primary care patient of Unk Pinto, MD. I reached out to [employer] by phone today in response to a referral sent by Mr. Messi Twedt Reust's PCP, Unk Pinto, MD.   Mr. Hubbert was given information about Chronic Care Management services today including:  CCM service includes personalized support from designated clinical staff supervised by his physician, including individualized plan of care and coordination with other care providers 24/7 contact phone numbers for assistance for urgent and routine care needs. Service will only be billed when office clinical staff spend 20 minutes or more in a month to coordinate care. Only one practitioner may furnish and bill the service in a calendar month. The patient may stop CCM services at any time (effective at the end of the month) by phone call to the office staff.   Patient agreed to services and verbal consent obtained.   Follow up plan:   Tatjana Secretary/administrator

## 2021-08-14 ENCOUNTER — Telehealth: Payer: Medicare Other | Admitting: Pharmacist

## 2021-08-14 NOTE — Progress Notes (Incomplete)
Patient Name:  DOB:     Last Care Plan Update:  COMPREHENSIVE CARE PLAN AND GOALS   HYPERTENSION  MOST RECENT BLOOD PRESSURE:     *** MY GOAL BLOOD PRESSURE:  *** CURRENT MEDICATION AND DOSING:  *** THE GOALS WE HAVE CHOSEN ARE:    Maintain an at goal blood pressure  BARRIERS TO ACHIEVING GOALS:  *** PLAN TO WORK ON THESE GOALS:  Take medications regularly   Check BP at home  Limit Alcohol 1-2 drinks per day  Reduce salt intake (< 1500mg / day)  Diet: DASH diet (Choose fruits, vegetables, and low-fat dairy products. Increase whole grains, fish, poultry, nuts. Reduce red meats and sugars)  Weight: 1 kg = ~1 mmHg reduction  Exercise . CHOLESTEROL  MOST RECENT LABS:     TOTAL CHOLESTEROL: *** TRIGLYCERIDES: *** HDL: *** LDL: *** CURRENT MEDICATION AND DOSING:  *** THE GOALS WE HAVE CHOSEN ARE:    Total Cholesterol goal under 200, Triglycerides goal under 150, HDL goal above 40, LDL goal under 100  BARRIERS TO ACHIEVING GOALS:  *** PLAN TO WORK ON THESE GOALS:  Take medication regularly  Diet: high in plant sterols (fruits/ vegetables/ nuts/ whole grains/ legumes). Increase fiber intake (10-25g/day). Avoid foods high in cholesterol (red meat, egg yolks, dairy, oils/ butter). Choose low-fat options.  Exercise  Weight Management .  DIABETES  MOST RECENT A1C:  ***     MY GOAL A1C:  *** LAST FOOT EXAM: ***  LAST EYE EXAM: ***  CURRENT MEDICATION AND DOSING: ***  THE GOALS WE HAVE CHOSEN ARE:   Maintain an at goal A1C level  BARRIERS TO ACHIEVING GOALS:  *** PLAN TO WORK ON THESE GOALS:  Take medications as prescribed  Check blood glucose ***  Diet: natural carbs and sugars (vegetables, fruits, whole grains, legumes, dairy), avoid alcohol, reduce carbohydrate intake and processed sugars. Maintain a low carbohydrate diet, eating 45 grams of carbohydrates per meal (3 servings of carbohydrates per meal), 15 grams of carbohydrates per snack (1 serving of carbohydrate  per snack)  Weight Loss: waist circumference goal < 35 inches for females; < 40 inches for males  Exercise: 150 minutes of moderate-intensity aerobic activity per week (at least 3 days)   Discussed Technique: testing / injection as applicable  Foot care: wash, dry examine feet daily. Moisturize the top and bottom (not in between). Trim nails with nail file (no sharp edges). Wear socks and shoes. Elevate feet when sitting. Annual foot exam by podiatrist.   Recognize sign and symptoms of hypoglycemia and understand treatment as outlined below  Hypoglycemia sign and symptoms: dizziness, anxiety/ irritability, shakiness, headache, diaphoresis, hunger, confusion, nausea, ataxia, tremors, palpitations/ tachycardia, blurred vision  Hypoglycemia Treatment: Rule of 15:   (1) 15-20 grams of glucose/ simple carb (4 oz juice, 8oz milk, 4oz regular soda, 1 tablespoon sugar/ honey/ corn syrup, 3-4 glucose tabs/ 1 serving glucose gel)  (2) Recheck BG after 15 mins. If hypoglycemia continues  (3) Repeat steps 1&2, when BG normalizes, eat small meal or snack  Prediabetes/ Impaired Fasting Glucose  MOST RECENT A1C:        ***                                                             Recent  fasting glucose: ***   CURRENT REGIMEN AND DOSING:  None  THE GOALS WE HAVE CHOSEN ARE:  Lower A1C/ Fasting Glucose  PLAN TO WORK ON THESE GOALS:    Focus on managing/monitoring CVD risk factors, including keeping blood pressure and lipids under goal.   Modify lifestyle, including participating in moderate physical activity (e.g., walking) at least 150 minutes per week.   Consider a Mediterranean eating plan with an emphasis on whole grains, legumes, nuts, fruits, and vegetables and minimal refined and processed foods.     ASPIRIN THERAPY  CURRENT ASPIRIN REGIMEN AND DOSING:  *** THE GOALS WE HAVE CHOSEN ARE:   No signs and symptoms of bleeding.    BARRIERS TO ACHIEVING GOALS:  At goal  PLAN TO WORK ON THESE  GOALS:   Continue Aspirin therapy Educated to not start any medication that would increase risk of bleeding, such as NSAIDS or DOACS.    GERD  CURRENT REGIMEN AND DOSING:  *** THE GOALS WE HAVE CHOSEN ARE:   Minimize reflux symptoms.  BARRIERS TO ACHIEVING GOALS:  *** PLAN TO WORK ON THESE GOALS:    Educated on ways to prevent acid reflux, such as avoid spicy foods, smaller portion sizes, wearing loose clothing, avoiding caffeine, and to raise head of bed.      COPD  LAST SPIROMETRY SCORE/DATE:  ***                                                        CAT SCORE/DATE: ***     CURRENT CLASSIFICATION AS:    *** EOSINOPHIL COUNT:     EOS %: ***                            EOS ABSOLUTE: ***  CURRENT MEDICATION AND DOSING:  *** THE GOALS WE HAVE CHOSEN ARE:  Reduce shortness of breath and reduce hospitalizations due to COPD exacerbations  BARRIERS TO ACHIEVING GOALS:  *** PLAN TO WORK ON THESE GOALS:    Use inhalers regularly as directed  Educated on inhaler usage and discussed technique  Educated on ways to prevent exacerbation of COPD  Stay up to date on influenza and pneumococcal immunizations   Call CPP or MD immediately if: more coughing, wheezing, SOB than usual; changes in color, thickness or amount of mucus; feeling tired for more than one day; swelling of legs or ankles; trouble sleeping; feeling the need to increase oxygen (low oxygen levels on pulse ox)  Call 911: severe shortness of breath or chest pain; blue color in lips, fingers; confusion, disorientation, difficulty speaking in full sentences     Asthma  CURRENT REGIMEN AND DOSING:  *** THE GOALS WE HAVE CHOSEN ARE:  *** BARRIERS TO ACHIEVING GOALS:  *** PLAN TO WORK ON THESE GOALS:    Use inhalers regularly as directed  Assessing and controlling risk factors, triggers and comorbid conditions  Use proper inhaler technique  Stay up to date on influenza and pneumococcal immunizations  HEART FAILURE  TYPE  OF HEART FAILURE: ***      LAST EJECTION FRACTION/DATE:  *** CURRENTLY STAGED AT:   ***  CURRENT MEDICATION AND DOSING:  *** THE GOALS WE HAVE CHOSEN ARE:   Reduce shortness of breath and reduce hospitalizations due to heart  failure exacerbations  BARRIERS TO ACHIEVING GOALS:  *** PLAN TO WORK ON THESE GOALS:  Take medications regularly  Check weight daily and log  Maintain low sodium diet < 1500mg / day  Activity plan: start slow and stop if feeling chest pain  Discussed CHF action plan  *GREEN: Take medications as directed, go to your doctor's appointments   *YELLOW: May need diuretic adjustment, call MD or CPP: Weight gain > 3lbs in one day or 3-5 lbs in one week; increased swelling or coughing; increased number of pillows to sleep, shortness of breath with activity  *RED: CALL MD NOW or 911 (if chest pain): weight gain > 5 lbs in 1 week; waking at night SOB; dizzy or falling; SOB at rest, chest tightness or wheezing  ATRIAL FIBRILLATION  PATIENT IS CURRENTLY RHYTHM/RATE CONTROLLED:  ***      CURRENT MEDICATION AND DOSING:  *** THE GOALS WE HAVE CHOSEN ARE:  *** BARRIERS TO ACHIEVING GOALS:  *** PLAN TO WORK ON THESE GOALS: ***    GOUT  URIC ACID/DATE:   ***     CURRENT MEDICATION AND DOSING: ***  THE GOALS WE HAVE CHOSEN ARE:    Maintain appropriate uric acid level and reduce gout flares  BARRIERS TO ACHIEVING GOALS:  *** PLAN TO WORK ON THESE GOALS:  Continue medication regularly  Avoid foods that worsen gout symptoms  HYPOTHYROIDISM  RECENT TSH/DATE:  ***       T3/T4/DATE: ***  CURRENT MEDICATION AND DOSING:  *** THE GOALS WE HAVE CHOSEN ARE:   Maintain TSH between 0.45 to 4.5uIU/ml  BARRIERS TO ACHIEVING GOALS:  *** PLAN TO WORK ON THESE GOALS:    Continue Medication  Take medication with water at the same time each day for consistent absorption at least 1 hour before breakfast or at bedtime (3 hours after last meal)   Recognize signs and symptoms  of thyroid imbalance:  Hyperthyroid: Heat intolerance (increased sweating), weight loss, agitation/ nervousness/ irritability/ anxiety, palpitations/ tachycardia, fatigue/ muscle weakness, diarrhea, insomnia, tremor, thinning hair, exophthalmos  Hypothyroid: cold intolerance, dry skin, fatigue, muscle cramps, voice changes, constipation, weight gain, myalgias, weakness, depression, bradycardia, hair loss, memory/ mental impairment   Osteoarthritis  CURRENT REGIMEN AND DOSING:  *** THE GOALS WE HAVE CHOSEN ARE:  Pain management  Decrease pressure on weight bearing joints  PLAN TO WORK ON THESE GOALS:    Exercise: resistance training to build muscles, weight loss to decrease pressure on weight bearing joints    Osteopenia/ Osteoporosis  CURRENT REGIMEN AND DOSING: ***  THE GOALS WE HAVE CHOSEN ARE:  Reduce fall risk  Strengthen muscles and bones  PLAN TO WORK ON THESE GOALS:    Assess home for fall risks, use handrails on stairs and safety bars in bathroom, non-skid mats for the bath tub/ shower  Exercise: regular weight bearing exercises (walking, jogging) and muscle strengthening (weight training, yoga)   Anxiety  CURRENT REGIMEN AND DOSING:  *** THE GOALS WE HAVE CHOSEN ARE:  Lower and manage symptoms of anxiety  PLAN TO WORK ON THESE GOALS:    Continue medication as prescribed  Inform practitioner of any signs and symptoms of worsening anxiety  Symptoms: fear, worry, tachycardia, palpitations, SOB, stomach upset, chest pain, insomnia, fatigue  Lifestyle changes: increasing physical activity, community involvement, yoga and meditation   Depression  CURRENT REGIMEN AND DOSING:  *** THE GOALS WE HAVE CHOSEN ARE:  Lower and manage symptoms of depression  PLAN TO WORK ON THESE GOALS:  Continue medication as prescribed  Inform practitioner of any signs and symptoms of worsening depression  Symptoms: persistent feeling of hopelessness, dejection, constant worry, poor  concentration, lack of energy, inability to sleep, sometimes suicidal tendencies, agitation, decreased concentration, sleep changes, diminished interest/ pleasure, changes in appetite  Chronic Kidney Disease  Labs:  *** PLAN TO WORK ON THESE GOALS:    Maintaining normal BP and BG  Diet: Decrease salt and fatty foods. Increase fruit and vegetables  *** CURRENT REGIMEN AND DOSING:  *** THE GOALS WE HAVE CHOSEN ARE:  *** BARRIERS TO ACHIEVING GOALS:  *** PLAN TO WORK ON THESE GOALS:    ***  OTHER GOALS AND INTERVENTIONS GOAL (Describe and start date): INTERVENTION:      DATE COMPLETED: INTERVENTION:      DATE COMPLETED: INTERVENTION:       DATE COMPLETED:   ACTIVE MEDICATION LIST  Your current medication list has been updated. To view, log in to your patient portal.  Call if any changes need to be made.   MEDICATION REVIEW  MEDICATION REVIEW CONDUCTED:   Yes   DATE:    *** BEST POSSIBLE MEDICATION HISTORY  SOURCE:   Medical Records     HOW DO I? - WHEN DO I?   GET AHOLD OF MY DOCTOR?   DURING BUSINESS HOURS WHEN THE OFFICE IS OPEN    PHONE: [phone] AFTER HOURS [facility] NURSE WHEN THE OFFICE IS CLOSED   PHONE: [phone]  TALK TO [facility] CARE COORDINATOR NAME: Newton Pigg  PHONE: [phone] EMAIL:  [email or web address] CARE COORDINATOR STAFF   NAME: Rosary Lively, Marlene Bast, Sandria Bales  PHONE: [phone]  NOTE SECTION  Thank you for participating in the Chronic Care Management (CCM) program with Dr. Newton Pigg   This program takes a proactive approach to your health and my team will serve as a resource for you throughout the year. Please follow up at [phone] if you have any questions or experience changes to your overall health. Your next CCM appointment will be conducted with Newton Pigg, PharmD as follows:    Date:    Time:   In Office  /Over the Phone

## 2021-08-20 ENCOUNTER — Other Ambulatory Visit: Payer: Self-pay | Admitting: Internal Medicine

## 2021-08-20 MED ORDER — OLMESARTAN MEDOXOMIL 40 MG PO TABS: ORAL_TABLET | ORAL | 3 refills | Status: DC

## 2021-08-21 DIAGNOSIS — E1169 Type 2 diabetes mellitus with other specified complication: Secondary | ICD-10-CM | POA: Diagnosis not present

## 2021-08-21 DIAGNOSIS — I10 Essential (primary) hypertension: Secondary | ICD-10-CM | POA: Diagnosis not present

## 2021-08-21 DIAGNOSIS — N183 Chronic kidney disease, stage 3 unspecified: Secondary | ICD-10-CM | POA: Diagnosis not present

## 2021-08-21 DIAGNOSIS — E1129 Type 2 diabetes mellitus with other diabetic kidney complication: Secondary | ICD-10-CM | POA: Diagnosis not present

## 2021-08-25 ENCOUNTER — Other Ambulatory Visit: Payer: Self-pay | Admitting: Internal Medicine

## 2021-09-09 ENCOUNTER — Other Ambulatory Visit: Payer: Self-pay | Admitting: Adult Health

## 2021-09-17 ENCOUNTER — Other Ambulatory Visit: Payer: Self-pay | Admitting: Nurse Practitioner

## 2021-09-17 DIAGNOSIS — N1832 Chronic kidney disease, stage 3b: Secondary | ICD-10-CM | POA: Diagnosis not present

## 2021-09-25 DIAGNOSIS — N1832 Chronic kidney disease, stage 3b: Secondary | ICD-10-CM | POA: Diagnosis not present

## 2021-09-25 DIAGNOSIS — I129 Hypertensive chronic kidney disease with stage 1 through stage 4 chronic kidney disease, or unspecified chronic kidney disease: Secondary | ICD-10-CM | POA: Diagnosis not present

## 2021-09-25 DIAGNOSIS — E1122 Type 2 diabetes mellitus with diabetic chronic kidney disease: Secondary | ICD-10-CM | POA: Diagnosis not present

## 2021-09-25 DIAGNOSIS — D631 Anemia in chronic kidney disease: Secondary | ICD-10-CM | POA: Diagnosis not present

## 2021-10-04 NOTE — Progress Notes (Signed)
AWV and FOLLOW UP  Assessment and Plan:    Annual Medicare Wellness Visit Due annually  Health maintenance reviewed  Essential hypertension Continue current medications Monitor blood pressure at home; call if consistently over 130/80 Continue DASH diet.   Reminder to go to the ER if any CP, SOB, nausea, dizziness, severe HA, changes vision/speech, left arm numbness and tingling and jaw pain. -     CBC with Differential/Platelet -     COMPLETE METABOLIC PANEL WITH GFR -     Magnesium  Hyperlipidemia associated with type 2 diabetes mellitus (HCC) Continue medications: Pravastatin 48m daily Discussed dietary and exercise modifications Low fat diet LDL goal <70 -     Lipid panel  Thoracic aortic atherosclerosis (HCC) - per CT 2018 Control blood pressure, lipids and glucose Disscused lifestyle modifications, diet & exercise Continue to monitor  Type 2 diabetes mellitus with stage 3b chronic kidney disease, without long-term current use of insulin (HCC) Continue glimepiride - 4 mg BID Lack of benefit with trulicity, will switch back to ozempic and start financial assistance paperwork Off of metformin due to advancing CKD Long discussion about dietary choices and physical activity Perform daily foot/skin check, notify office of any concerning changes.  Check A1C Q356m     Hemoglobin A1c  Diabetic sensory polyneuropathy (HCMikesFoot exam utd, no concerns today  Stage 3 chronic kidney disease due to type 2 diabetes mellitus (HCC) Increase fluids, avoid NSAIDS, monitor sugars, will monitor  BPH with obstruction/lower urinary tract symptoms Doing well at this time Continue medications: Doxazosin 45m39mVitamin D deficiency Continue supplementation to maintain goal of 70-100 Taking Vitamin D 1,000 IU daily Vitamin D level annually and PRN  Class 2 severe obesity due to excess calories with serious comorbidity and body mass index (BMI) of 37.0 to 37.9 in adult ([facility] Discussed dietary and exercise modifications Patient will work on increasing vegetables and increase length and frequency of exercise Will follow up in 3 months  Medication management Magnesium  Orders Placed This Encounter  Procedures   CBC with Differential/Platelet   COMPLETE METABOLIC PANEL WITH GFR   Magnesium   Lipid panel   TSH   Hemoglobin A1c      Continue diet and meds as discussed. Further disposition pending results of labs. Discussed med's effects and SE's.   Over 30 minutes of exam, counseling, chart review, and critical decision making was performed.   Future Appointments  Date Time Provider DepMiller0/18/2023  9:00 AM MulMagda BernheimP [facility] None  10/08/2022 11:00 AM CorLiane ComberP [facility] None   During the course of the visit the patient was educated and counseled about appropriate screening and preventive services including:   Pneumococcal vaccine  Influenza vaccine Td vaccine  Screening electrocardiogram Colorectal cancer screening Diabetes screening Glaucoma screening Nutrition counseling    -------------------------------------------------------------------------------------------------------------------------  HPI [age]. male  presents for AWV and follow up on poorly controlled T2DM. He has History of colonic polyps; Essential hypertension; Poorly controlled type 2 diabetes mellitus with renal complication (HCCSt. JacobHyperlipidemia associated with type 2 diabetes mellitus (HCCCornishVitamin D deficiency; Stage 3 chronic kidney disease due to type 2 diabetes mellitus (HCCIndian BeachMedication management; Diabetic sensory polyneuropathy (HCCAlleghanyBilateral chronic knee pain; Aortic Atherosclerosis (HCCMidwayy Abd CT scan 2018; Gluttony; and Class 2 severe obesity due to excess calories with serious comorbidity and body mass index (BMI) of 38.0 to 38.9 in adult ([facility]CKind[facility] Springn their problem list.  Pt continues  to have knee pain bilaterally. He is not on  current medication for pain. Has seen ortho and advised "bone on bone" several years but declined surgery, he is still able to do ADLs.   BMI is Body mass index is 37.24 kg/m., he has not been working on diet, no exercise due to knee pain. He gained weight and wife has put him back on diet, she fixes his plate.  Wt Readings from Last 3 Encounters:  10/08/21 252 lb 3.2 oz (114.4 kg)  07/09/21 253 lb 6.4 oz (114.9 kg)  02/05/21 256 lb (116.1 kg)   He does not check his BP at home, today their BP is BP: 128/84  BP Readings from Last 3 Encounters:  10/08/21 128/84  07/09/21 130/80  02/05/21 (!) 170/84     He does workout. He denies chest pain, shortness of breath, dizziness.   He has aortic atherosclerosis per CT 2018.    He is on cholesterol medication (pravastatin 40 mg daily) and denies myalgias. His cholesterol is at goal. The cholesterol last visit was:   Lab Results  Component Value Date   CHOL 140 07/09/2021   HDL 37 (L) 07/09/2021   LDLCALC 69 07/09/2021   TRIG 244 (H) 07/09/2021   CHOLHDL 3.8 07/09/2021    He has not been working on diet and exercise for DMII and has CKD stage 3 P neuropathy, Bilateral lower extremities Hyperlipidemia on pravastatin On ASA, ACEi Off metformin due to GFR <69 Trulicity 1.5 mg weekly, he feels this hasn't helped as much as ozempic but insurance wouldn't cover  Glimepride 75m in AM and BP, half tablt if evning glucose <150. Meter: Freestyle Checks daily, reports fasting has been persistently 200+, worse since switching to trulicity Denies hyperglycemia, hypoglycemia , increased appetite, nausea, polydipsia and polyuria.  Last A1C in the office wasl:  Lab Results  Component Value Date   HGBA1C 8.8 (H) 07/09/2021    He has CKD IIIb associated with T2DM monitored via this office, no microalbuminuria:  Lab Results  Component Value Date   EGFR 38 (L) 07/09/2021   Patient is on Vitamin D supplement and at goal:  Lab Results  Component  Value Date   VD25OH 62 07/09/2021     Current Medications:  Current Outpatient Medications on File Prior to Visit  Medication Sig   aspirin EC 81 MG tablet Take 81 mg by mouth at bedtime.    Blood Glucose Monitoring Suppl (ONE TOUCH ULTRA 2) w/Device KIT check blood sugar 1 time daily-E11.21   Cholecalciferol (VITAMIN D-3) 25 MCG (1000 UT) CAPS Take 1,000 Units by mouth at bedtime.   Cinnamon 500 MG TABS Take 1,000 mg by mouth daily.    doxazosin (CARDURA) 4 MG tablet Take  1 tablet  at Bedtime  for BP & Prostate   Dulaglutide (TRULICITY) 1.5 MGE/9.5MWSOPN Inject 1.5 mg into the skin once a week.   famotidine (PEPCID) 40 MG tablet Take 1 tablet Daily for Indigestion & Heartburn (Patient taking differently: Take 40 mg by mouth daily.)   furosemide (LASIX) 40 MG tablet Take 1 tablet Daily for BP and Fluid Retention / Ankle Swelling   glimepiride (AMARYL) 4 MG tablet TAKE 1/2 TO 1 TABLET BY MOUTH TWICE DAILY WITH MEALS FOR DIABETES   glucose blood (ONETOUCH ULTRA) test strip USE TO CHECK BLOOD SUGAR ONCE DAILY.   Lancets (ONETOUCH ULTRASOFT) lancets Check blood sugar 1 time daily-E11.21   magnesium oxide (MAG-OX) 400 MG tablet Take 400 mg by  mouth daily.   Multiple Vitamins-Minerals (ONE-A-DAY MENS 50+ ADVANTAGE) TABS Take 1 tablet by mouth daily with breakfast.   olmesartan (BENICAR) 40 MG tablet Take  1 tablet  Daily  for BP & Diabetic Kidney Protection  (Replaces Lisinopril)   Omega-3 Fatty Acids (FISH OIL) 1200 MG CAPS Take 1,200 mg by mouth daily.   pravastatin (PRAVACHOL) 40 MG tablet Take  1 tablet  at Bedtime  for Cholesterol   No current facility-administered medications on file prior to visit.    Medical History:  Past Medical History:  Diagnosis Date   AKI (acute kidney injury) ([place]) 04/20/2020   Arthritis    BPH (benign prostatic hyperplasia)    Cataract    CKD (chronic kidney disease), stage III ([place])    Depression    "usually a happy go lucky"   Diabetes mellitus     Diverticulosis    GERD (gastroesophageal reflux disease)    sometimes will take omeprazole   Hyperlipidemia    Hypertension    Sepsis secondary to UTI ([place]) 05/04/2020   Allergies Allergies  Allergen Reactions   Tramadol Other (See Comments)    "Made his head feel funny"    SURGICAL HISTORY He  has a past surgical history that includes Tonsillectomy and adenoidectomy; Vein ligation and stripping; Knee arthroscopy; Colonoscopy (2005, 08/21/11); Fracture surgery; Cholecystectomy (N/A, 11/29/2015); and Cataract extraction, bilateral (Bilateral, 2020). FAMILY HISTORY His family history includes Diabetes in his brother; Prostate cancer in his brother. SOCIAL HISTORY He  reports that he has been smoking cigars. He has never used smokeless tobacco. He reports current alcohol use. He reports that he does not use drugs.  Immunization History  Administered Date(s) Administered   DT (Pediatric) 08/31/2015   Influenza Whole 05/24/2011, 08/06/2012, 07/07/2013   Influenza, High Dose Seasonal PF 06/21/2014, 08/31/2015, 06/03/2016, 07/15/2017, 07/29/2018, 07/04/2019, 07/02/2020, 07/09/2021   PFIZER(Purple Top)SARS-COV-2 Vaccination 10/15/2019, 11/06/2019, 08/30/2020   PNEUMOCOCCAL CONJUGATE-20 07/09/2021   Pneumococcal Conjugate-13 06/21/2014   Pneumococcal Polysaccharide-23 03/10/2005   Health Maintenance  Topic Date Due   OPHTHALMOLOGY EXAM  11/09/2019   COVID-19 Vaccine (4 - Booster for Pfizer series) 10/25/2020   Zoster Vaccines- Shingrix (1 of 2) 01/06/2022 (Originally 04/06/1954)   HEMOGLOBIN A1C  01/07/2022   FOOT EXAM  10/08/2022   TETANUS/TDAP  08/30/2025   Pneumonia Vaccine 83+ Years old  Completed   INFLUENZA VACCINE  Completed   HPV VACCINES  Aged Out   COLONOSCOPY (Pts 45-1yr Insurance coverage will need to be confirmed)  Discontinued    Last colonoscopy: 2012 was due 2015 but declined another, also declines hemoccult  Eye exam: Dr. MEinar Gip ? In 2021, OVERDUE, reminded to  schedule Dentist: 2022 has full upper partials.   Patient Care Team: MUnk Pinto MD as PCP - General (Internal Medicine) DMarlou SaGTonna Corner MD as Consulting Physician (Orthopedic Surgery) ANewton Pigg RKindred Hospital [facility] Pasoas Pharmacist (Pharmacist)   MEDICARE WELLNESS OBJECTIVES: Physical activity: Current Exercise Habits: The patient does not participate in regular exercise at present, Exercise limited by: None identified Cardiac risk factors: Cardiac Risk Factors include: advanced age (>5107m, >6>69omen);dyslipidemia;hypertension;obesity (BMI >30kg/m2);diabetes mellitus;male gender;sedentary lifestyle;smoking/ tobacco exposure Depression/mood screen:   Dep[facility] screen [facility] 10/08/2021  Decreased Interest 0  Down, Depressed, Hopeless 0  PHQ - 2 Score 0    ADLs:  In your present state of health, do you have any difficulty performing the following activities: 10/08/2021  Hearing? N  Vision? N  Difficulty concentrating or making decisions? N  Walking or climbing stairs?  N  Comment slowly with steps  Dressing or bathing? N  Doing errands, shopping? N  Some recent data might be hidden     Cognitive Testing  Alert? Yes  Normal Appearance?Yes  Oriented to person? Yes  Place? Yes   Time? Yes  Recall of three objects?  Yes  Can perform simple calculations? Yes  Displays appropriate judgment?Yes  Can read the correct time from a watch face?Yes  EOL planning: Does Patient Have a Medical Advance Directive?: Yes Type of Advance Directive: Healthcare Power of Attorney, Living will Does patient want to make changes to medical advance directive?: No - Patient declined Copy of [place] in Chart?: No - copy requested      Review of Systems  Constitutional:  Negative for chills and fever.  HENT:  Negative for congestion, hearing loss, sinus pain, sore throat and tinnitus.   Eyes:  Negative for blurred vision and double vision.  Respiratory:  Positive for shortness of  breath (on exertion, chronic unchanged). Negative for cough, hemoptysis, sputum production and wheezing.   Cardiovascular:  Negative for chest pain, palpitations and leg swelling.  Gastrointestinal:  Negative for abdominal pain, constipation, diarrhea, heartburn, nausea and vomiting.  Genitourinary:  Negative for dysuria and urgency.       Gets up 2 times at night  Musculoskeletal:  Positive for joint pain (knees). Negative for back pain, falls, myalgias and neck pain.  Skin:  Negative for rash.  Neurological:  Negative for dizziness, tingling, tremors, weakness and headaches.  Endo/Heme/Allergies:  Does not bruise/bleed easily.  Psychiatric/Behavioral:  Negative for depression and suicidal ideas. The patient is not nervous/anxious and does not have insomnia.      Physical Exam: BP 128/84    Pulse 72    Temp 97.9 F (36.6 C)    Resp 16    Ht _0  (1.753 m)    Wt 252 lb 3.2 oz (114.4 kg)    SpO2 96%    BMI 37.24 kg/m  Wt Readings from Last 3 Encounters:  10/08/21 252 lb 3.2 oz (114.4 kg)  07/09/21 253 lb 6.4 oz (114.9 kg)  02/05/21 256 lb (116.1 kg)   General Appearance: Well nourished, obese, in no apparent distress. Eyes: PERRLA, EOMs, conjunctiva no swelling or erythema Sinuses: No Frontal/maxillary tenderness ENT/Mouth: Ext aud canals clear, L TMs without erythema, bulging, R TM with extensive scarring, erythematous around bony structures (per the patient he has chronic drainage issue that has been cleared by ENT). No erythema, swelling, or exudate on post pharynx.  Tonsils not swollen or erythematous. Hearing normal.  Neck: Supple, thyroid normal.  Respiratory: Respiratory effort normal, BS equal bilaterally without rales, rhonchi, wheezing or stridor.  Cardio: RRR with no MRGs. 1+ peripheral pulses equally with 1+ non-pitting edema bilateral ankles.  Abdomen: Soft, rounded obese abdomen + BS.  Non tender, no guarding, rebound, palpable hernias or masses. Lymphatics: Non tender  without lymphadenopathy.  Musculoskeletal: BIl knees with bony enlargement, crepitus, no effusion, 5/5 strength, antalgic gait Skin: Warm, dry without rashes, lesions, ecchymosis. Flaking skin of feet bilaterally, thickened first digit toe nails.  Neuro: Cranial nerves intact. No cerebellar symptoms. Sensation in feel intact bilaterally to monofilament testing.  Psych: Awake and oriented X 3, normal affect, Insight and Judgment appropriate.     Medicare Attestation I have personally reviewed: The patient's medical and social history Their use of alcohol, tobacco or illicit drugs Their current medications and supplements The patient's functional ability including ADLs,fall risks,  home safety risks, cognitive, and hearing and visual impairment Diet and physical activities Evidence for depression or mood disorders  The patient's weight, height, BMI, and visual acuity have been recorded in the chart.  I have made referrals, counseling, and provided education to the patient based on review of the above and I have provided the patient with a written personalized care plan for preventive services.      Izora Ribas, NP 10:19 AM [facility]

## 2021-10-07 ENCOUNTER — Ambulatory Visit: Payer: Medicare Other | Admitting: Adult Health

## 2021-10-07 IMAGING — CT CT CHEST W/O CM
2 of 4 series · 12 of 36 positions shown, 15 images · non-contrast
Comparison: CT abdomen and pelvis October 21, 2016

CLINICAL DATA: Pain following recent fall

EXAM:
CT CHEST, ABDOMEN AND PELVIS WITHOUT CONTRAST
TECHNIQUE: Multidetector CT imaging of the chest, abdomen and pelvis was
performed following the standard protocol without IV contrast.

[Series 1: cap w/o · axial · non-contrast · 0.83mm/px · z∈[+1022,+1562]mm · 9 of 128 slices shown, 12 images]
[im 10/128  mediastinal]
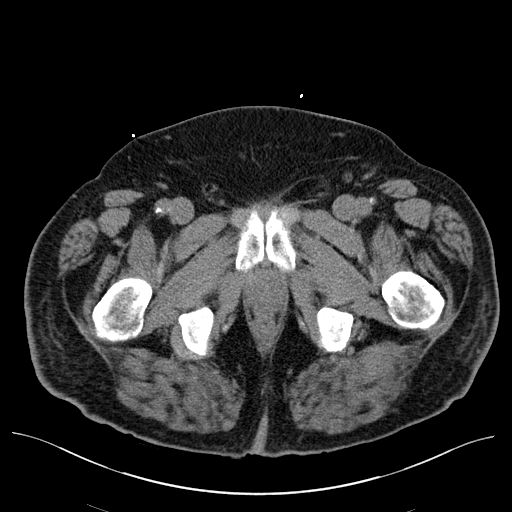
[im 10/128  lung]
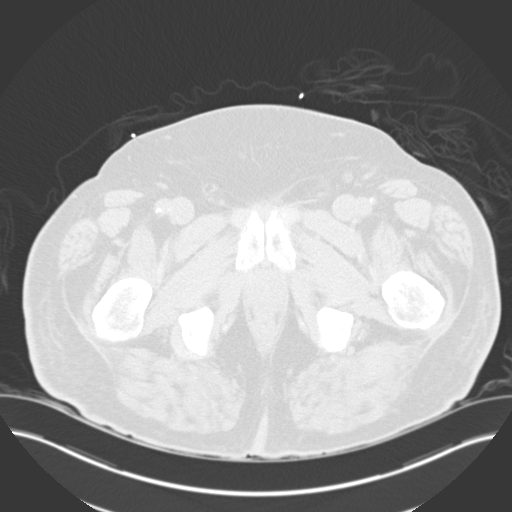
[im 30/128  lung]
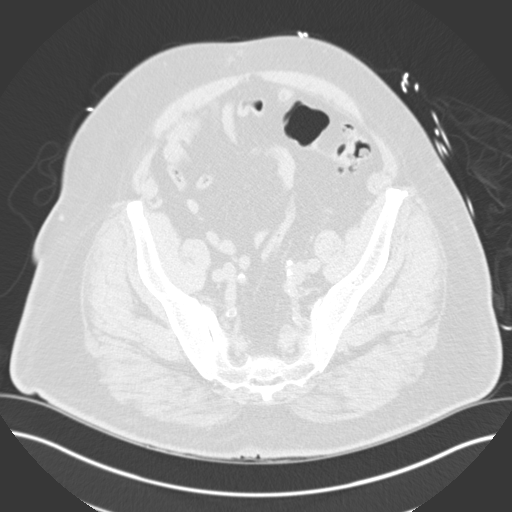
[im 40/128  lung]
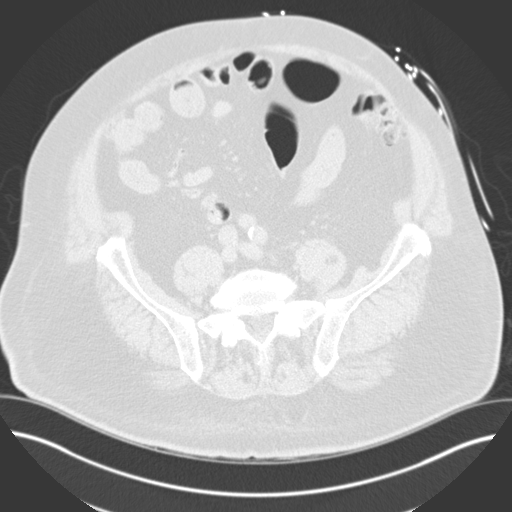
[im 49/128  lung]
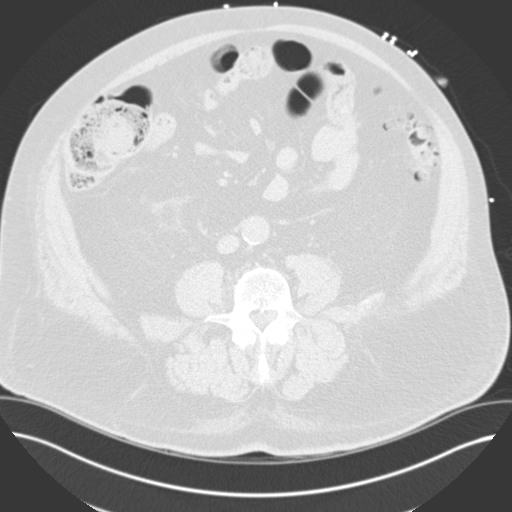
[im 69/128  mediastinal]
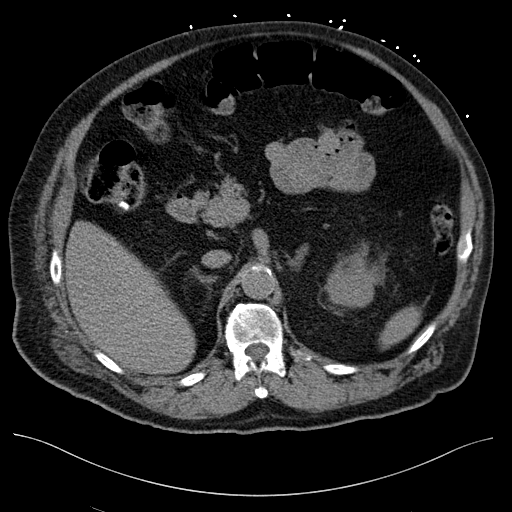
[im 69/128  lung]
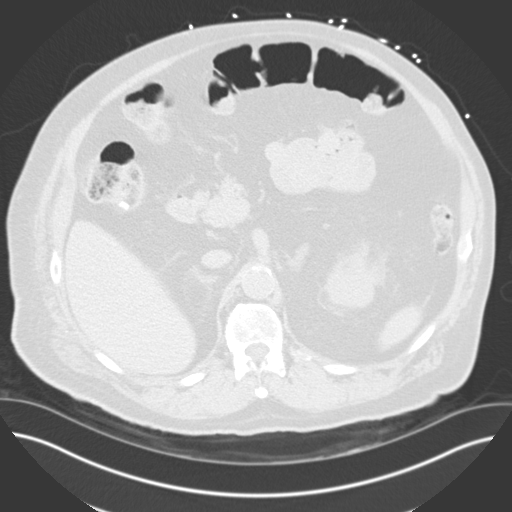
[im 79/128  lung]
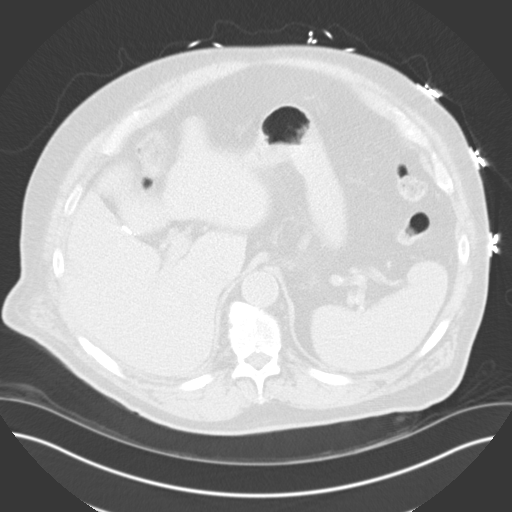
[im 88/128  lung]
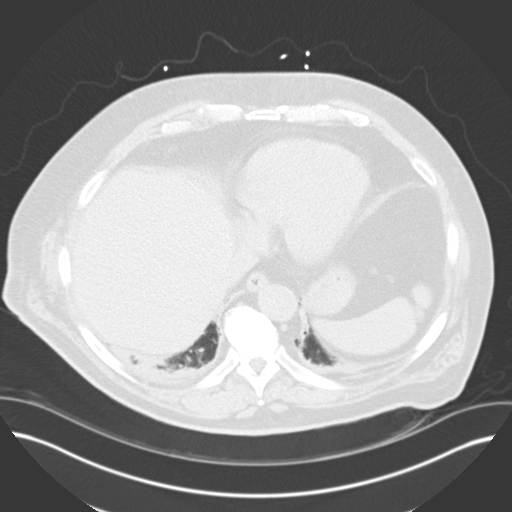
[im 108/128  lung]
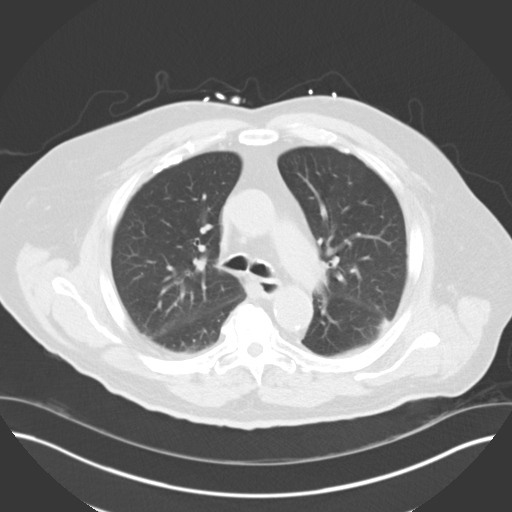
[im 118/128  mediastinal]
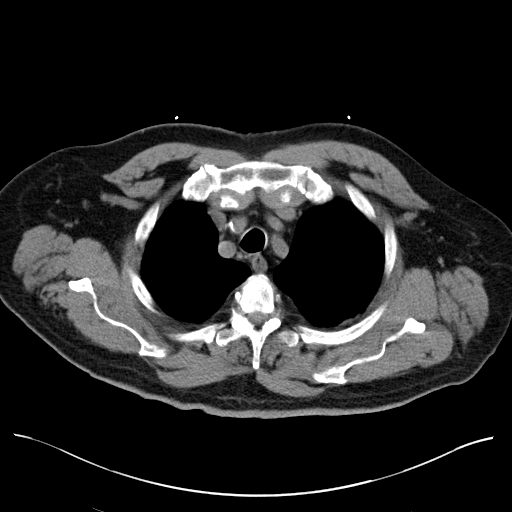
[im 118/128  lung]
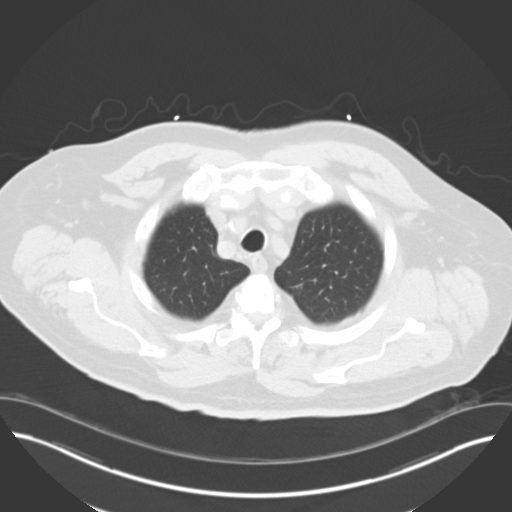

[Series 4: coronals · coronal · 0.80mm/px · 3 of 176 slices shown]
[im 36/176  lung]
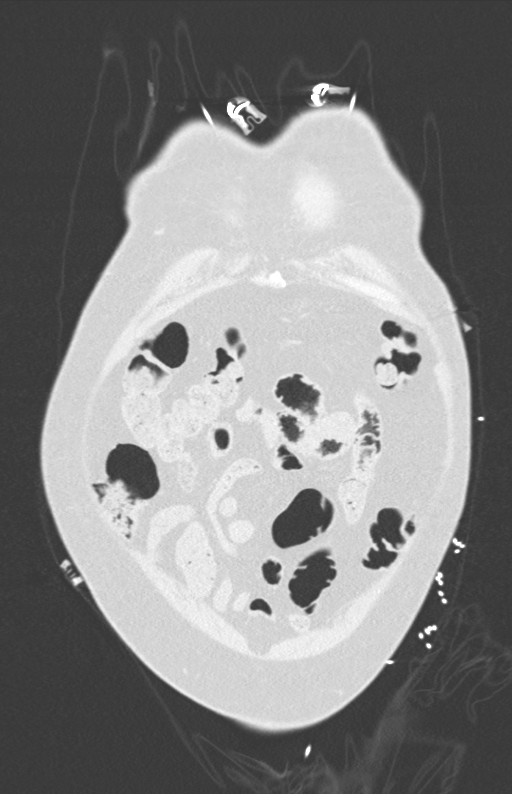
[im 71/176  lung]
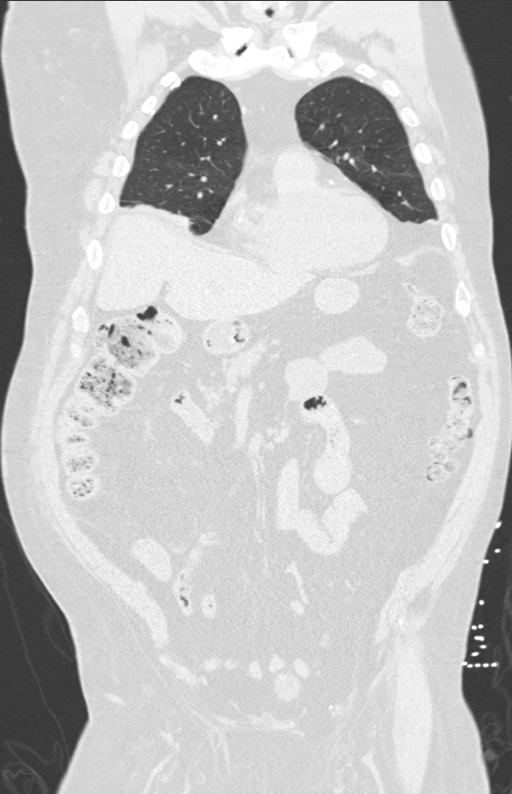
[im 106/176  lung]
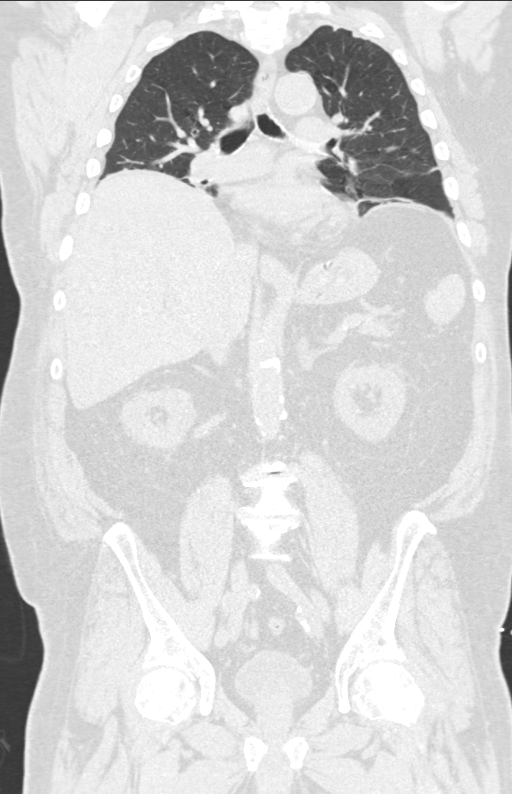

[12 of 36 positions shown; findings below may reference images not displayed]

FINDINGS: CT CHEST FINDINGS

Cardiovascular: There is no evident fluid or apparent hemorrhage in
the periaortic/mediastinal region. There is no thoracic aortic
aneurysm. There are scattered foci of calcification in visualized
great vessels. There is aortic atherosclerosis. There are foci of
coronary artery calcification. There is no appreciable pericardial
effusion or pericardial thickening.

Mediastinum/Nodes: Thyroid appears normal. There is no appreciable
thoracic adenopathy. No esophageal lesions are evident.

Lungs/Pleura: There is no appreciable pneumothorax. There are areas
of patchy atelectatic change, primarily in the lung bases. There are
occasional calcified granulomas. There is no appreciable edema or
airspace opacity to suggest pneumonia. There are multiple foci of
pleural calcification. No appreciable pleural effusions.

Musculoskeletal: There is degenerative change in the thoracic spine
with a degree of diffuse idiopathic skeletal hyperostosis. There is
arthropathy with vacuum phenomenon in each sternoclavicular joint.
There is no demonstrable fracture or dislocation. No blastic or
lytic bone lesions. No evident chest wall lesions.

CT ABDOMEN PELVIS FINDINGS

Hepatobiliary: No perihepatic fluid noted. No focal liver lesions
are evident on this noncontrast enhanced study. No overt liver
laceration or rupture noted. Gallbladder is absent. There is no
biliary duct dilatation.

Pancreas: No pancreatic mass or inflammatory focus. No
peripancreatic fluid.

Spleen: No perisplenic fluid. No abnormal attenuation in the spleen
to suggest potential laceration or rupture on noncontrast enhanced
study. No splenic lesions evident.

Adrenals/Urinary Tract: Adrenals bilaterally appear normal. There is
a cyst in the lower pole of the left kidney measuring 2.7 x 2.4 cm.
There is no appreciable hydronephrosis on either side. Perinephric
thickening bilaterally is stable compared to the 2206 study. There
is not felt to be perinephric fluid on either side currently. There
is no evident renal or ureteral calculus on either side. Urinary
bladder is midline with wall thickness within normal limits.

Stomach/Bowel: There is no appreciable bowel wall or mesenteric
thickening. There is no evident bowel obstruction. Terminal ileum
appears normal. No evident free air or portal venous air.

Vascular/Lymphatic: No abdominal aortic aneurysm. There is aortic
and common iliac artery atherosclerosis. No periaortic fluid. No
adenopathy appreciable in the abdomen or pelvis.

Reproductive: Prostate is prominent, similar in appearance to prior
study. Prostate abuts and impresses upon the inferior aspect of the
urinary bladder. Seminal vesicles appear normal bilaterally.

Other: Appendix appears normal. No abscess or ascites in the abdomen
or pelvis. No loculated fluid collections evident in the abdomen or
pelvis.

Musculoskeletal: No appreciable fracture or dislocation. There is
degenerative change in the lumbar spine. No blastic or lytic bone
lesions. There is no intramuscular or abdominal wall lesions.
IMPRESSION: Chest CT:

1. No pneumothorax. Areas of atelectatic change bilaterally. Areas
of pleural calcification may represent residua from asbestos
exposure. Occasional lung granulomas noted. No edema or
consolidation.

2. Aortic atherosclerosis. Foci of great vessel and coronary artery
calcification noted. No mediastinal hemorrhage or fluid noted. Note
that evaluation of the aorta is limited without intravenous
contrast.

3.  No evident adenopathy.

CT abdomen and pelvis:

1. No acute traumatic appearing lesion evident on this noncontrast
enhanced study.

2. No bowel wall thickening or bowel obstruction. No abscess in the
abdomen or pelvis. Appendix appears normal.

3. Prominence of the prostate is stable. Prostate abuts the inferior
either.

4.  Gallbladder absent.

5.  Aortic Atherosclerosis ([id number]).

## 2021-10-07 IMAGING — CR DG HUMERUS 2V *R*
2 series · 2 of 2 positions shown · non-contrast
Comparison: None.

CLINICAL DATA: Recent fall with arm pain, initial encounter

EXAM:
RIGHT HUMERUS - 2+ VIEW

[x humerus ap right]
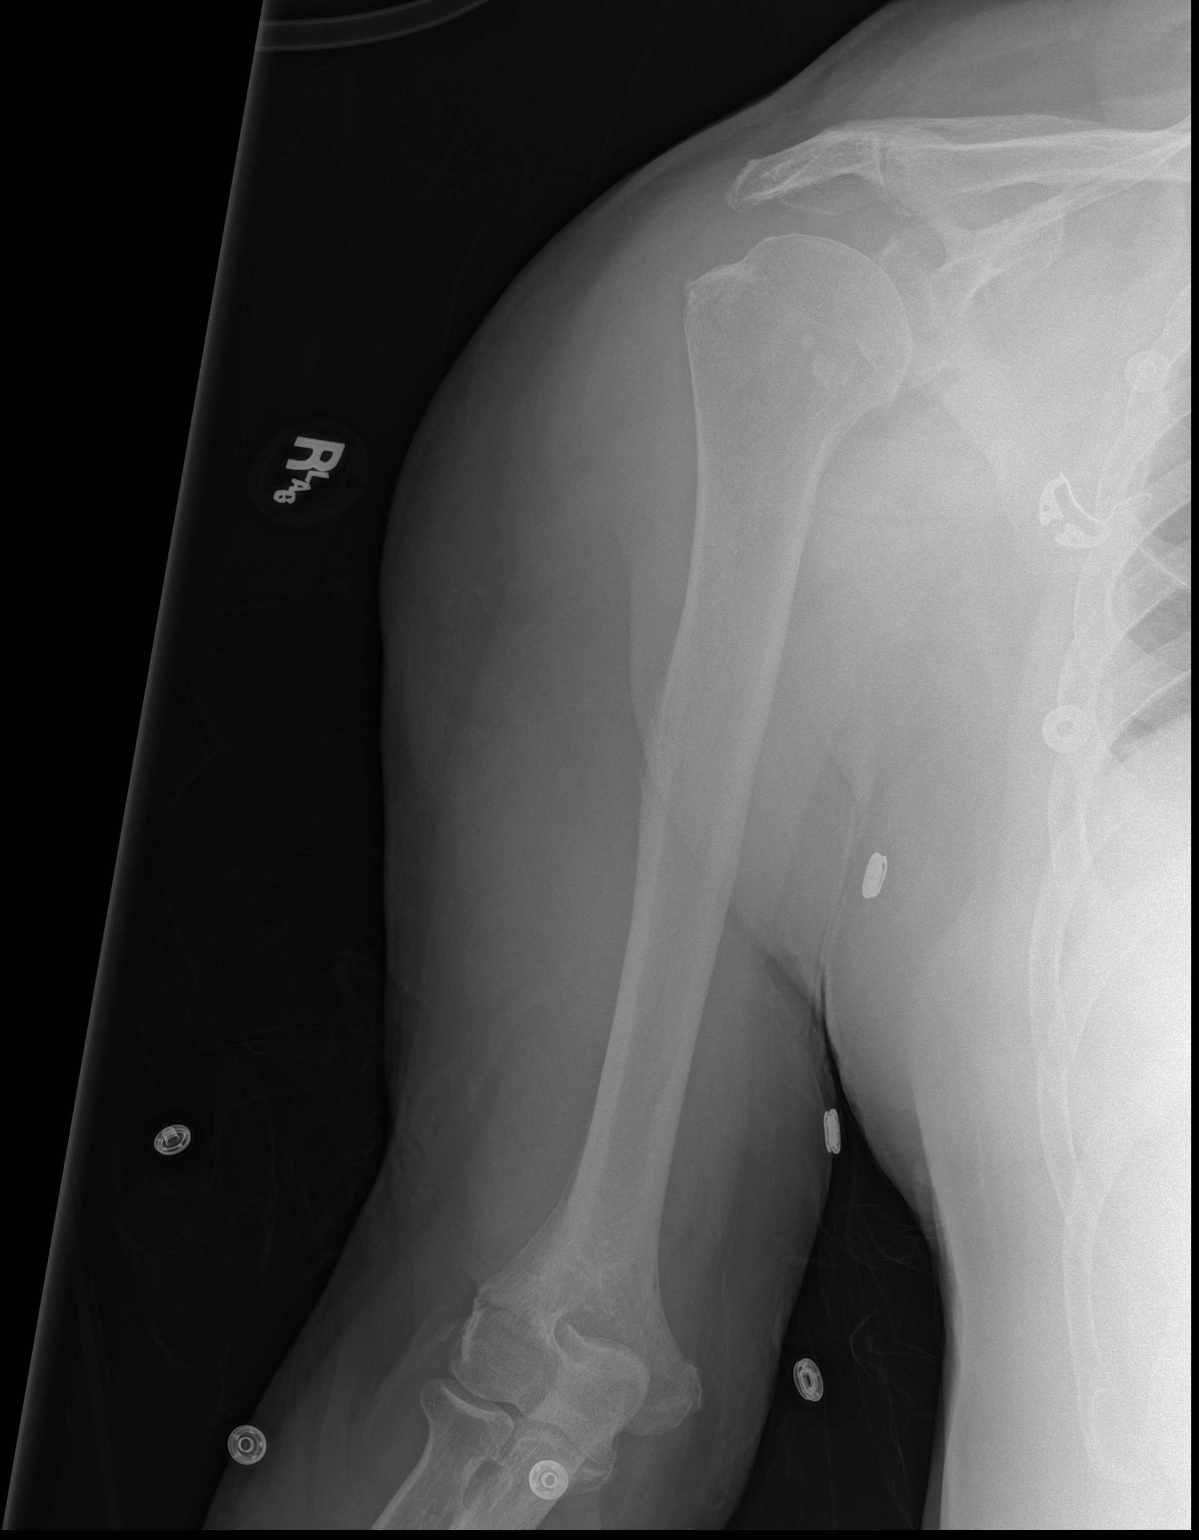

[x humerus lat right]
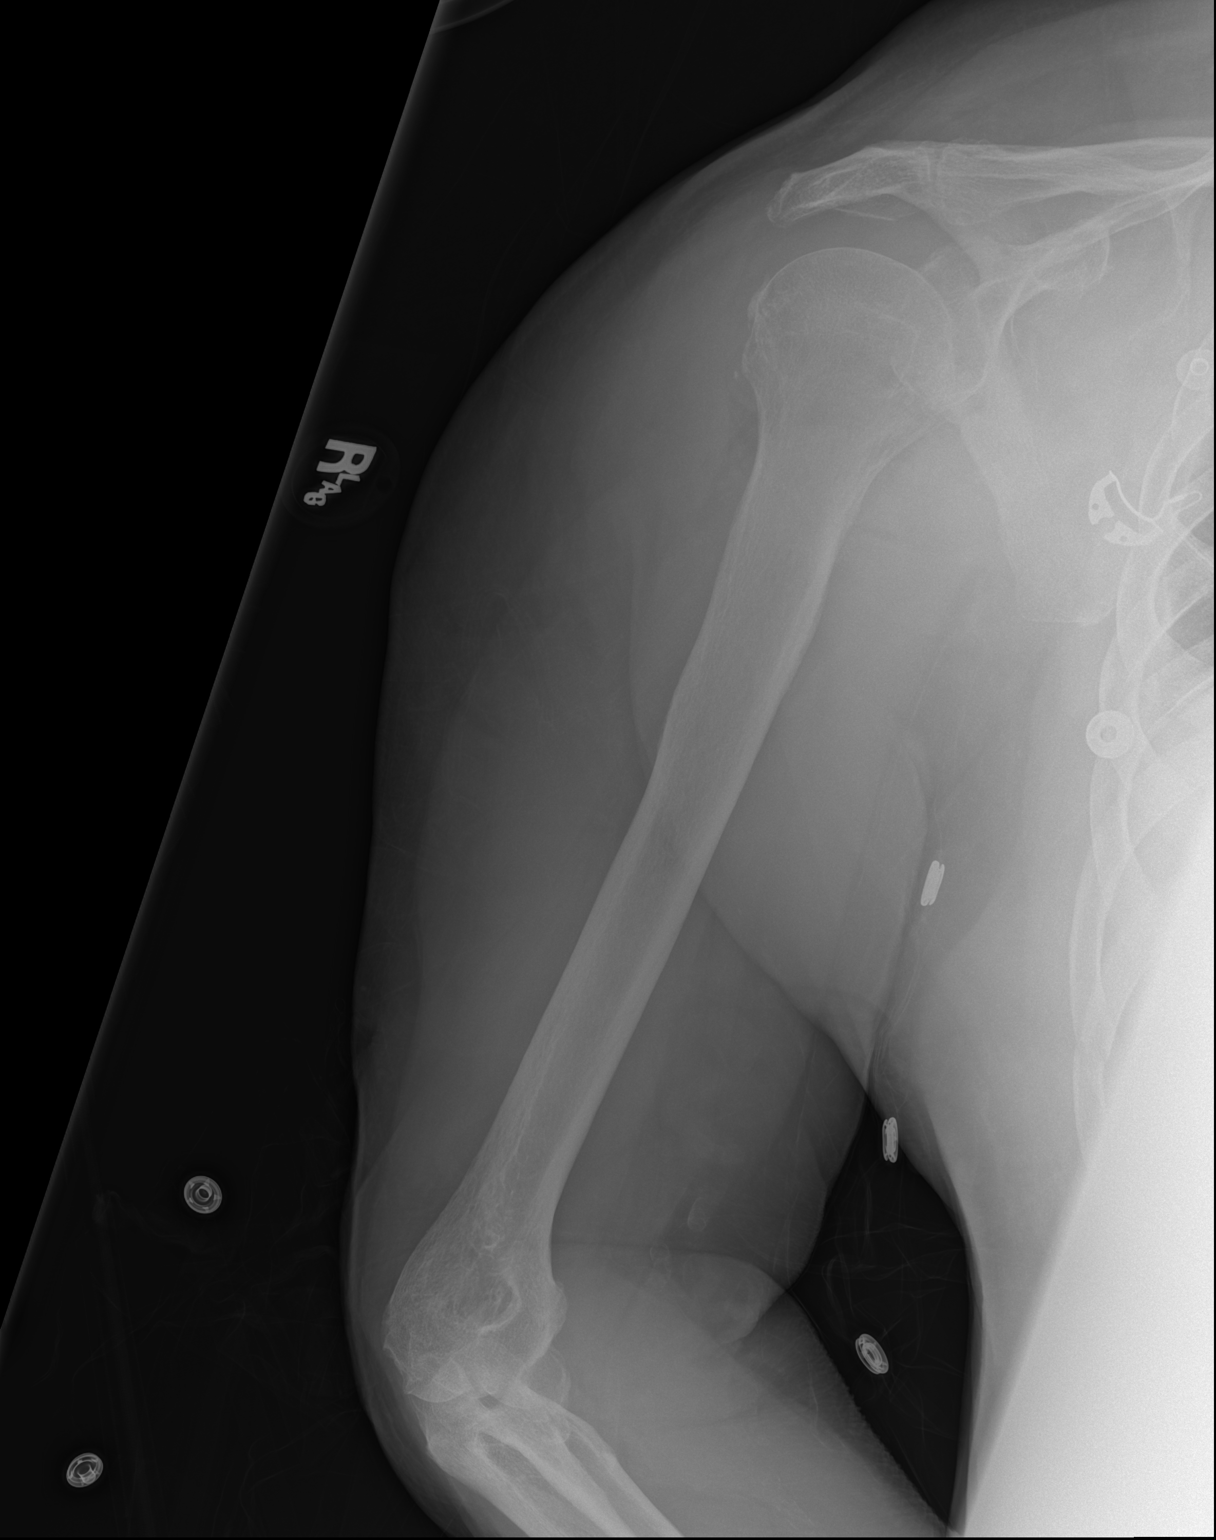

[2 of 2 positions shown; findings below may reference images not displayed]

FINDINGS: There is no evidence of fracture or other focal bone lesions. Soft
tissues are unremarkable.
IMPRESSION: No acute fracture noted.

## 2021-10-08 ENCOUNTER — Ambulatory Visit (INDEPENDENT_AMBULATORY_CARE_PROVIDER_SITE_OTHER): Payer: Medicare Other | Admitting: Adult Health

## 2021-10-08 ENCOUNTER — Encounter: Payer: Self-pay | Admitting: Adult Health

## 2021-10-08 ENCOUNTER — Other Ambulatory Visit: Payer: Self-pay

## 2021-10-08 VITALS — BP 128/84 | HR 72 | Temp 97.9°F | Resp 16 | Ht 69.0 in | Wt 252.2 lb

## 2021-10-08 DIAGNOSIS — E785 Hyperlipidemia, unspecified: Secondary | ICD-10-CM

## 2021-10-08 DIAGNOSIS — E1122 Type 2 diabetes mellitus with diabetic chronic kidney disease: Secondary | ICD-10-CM

## 2021-10-08 DIAGNOSIS — Z79899 Other long term (current) drug therapy: Secondary | ICD-10-CM

## 2021-10-08 DIAGNOSIS — Z0001 Encounter for general adult medical examination with abnormal findings: Secondary | ICD-10-CM

## 2021-10-08 DIAGNOSIS — E1165 Type 2 diabetes mellitus with hyperglycemia: Secondary | ICD-10-CM | POA: Diagnosis not present

## 2021-10-08 DIAGNOSIS — M25561 Pain in right knee: Secondary | ICD-10-CM

## 2021-10-08 DIAGNOSIS — E1129 Type 2 diabetes mellitus with other diabetic kidney complication: Secondary | ICD-10-CM | POA: Diagnosis not present

## 2021-10-08 DIAGNOSIS — E559 Vitamin D deficiency, unspecified: Secondary | ICD-10-CM

## 2021-10-08 DIAGNOSIS — N183 Chronic kidney disease, stage 3 unspecified: Secondary | ICD-10-CM

## 2021-10-08 DIAGNOSIS — I700 Atherosclerosis of aorta: Secondary | ICD-10-CM

## 2021-10-08 DIAGNOSIS — Z6838 Body mass index (BMI) 38.0-38.9, adult: Secondary | ICD-10-CM

## 2021-10-08 DIAGNOSIS — E1142 Type 2 diabetes mellitus with diabetic polyneuropathy: Secondary | ICD-10-CM

## 2021-10-08 DIAGNOSIS — Z86010 Personal history of colonic polyps: Secondary | ICD-10-CM

## 2021-10-08 DIAGNOSIS — G8929 Other chronic pain: Secondary | ICD-10-CM

## 2021-10-08 DIAGNOSIS — I10 Essential (primary) hypertension: Secondary | ICD-10-CM

## 2021-10-08 DIAGNOSIS — Z0000 Encounter for general adult medical examination without abnormal findings: Secondary | ICD-10-CM

## 2021-10-08 DIAGNOSIS — R6889 Other general symptoms and signs: Secondary | ICD-10-CM | POA: Diagnosis not present

## 2021-10-08 DIAGNOSIS — E1169 Type 2 diabetes mellitus with other specified complication: Secondary | ICD-10-CM

## 2021-10-08 MED ORDER — OZEMPIC (0.25 OR 0.5 MG/DOSE) 2 MG/1.5ML ~~LOC~~ SOPN: PEN_INJECTOR | SUBCUTANEOUS | 0 refills | Status: DC

## 2021-10-08 NOTE — Patient Instructions (Addendum)
°  [name] , Thank you for taking time to come for your Medicare Wellness Visit. I appreciate your ongoing commitment to your health goals. Please review the following plan we discussed and let me know if I can assist you in the future.   These are the goals we discussed:  Goals      Fasting Blood Glucose <150     Need to check fasting glucose DAILY      HEMOGLOBIN A1C < 7.0     Weight (lb) < 240 lb (108.9 kg)        This is a list of the screening recommended for you and due dates:  Health Maintenance  Topic Date Due   Eye exam for diabetics  11/09/2019   COVID-19 Vaccine (4 - Booster for Pfizer series) 10/25/2020   Zoster (Shingles) Vaccine (1 of 2) 01/06/2022*   Hemoglobin A1C  01/07/2022   Complete foot exam   10/08/2022   Tetanus Vaccine  08/30/2025   Pneumonia Vaccine  Completed   Flu Shot  Completed   HPV Vaccine  Aged Out   Colon Cancer Screening  Discontinued  *Topic was postponed. The date shown is not the original due date.     Please schedule eye exam and forward report

## 2021-10-09 ENCOUNTER — Other Ambulatory Visit: Payer: Self-pay | Admitting: Adult Health

## 2021-10-09 LAB — COMPLETE METABOLIC PANEL WITH GFR
AG Ratio: 2 (calc) (ref 1.0–2.5)
ALT: 19 U/L (ref 9–46)
AST: 16 U/L (ref 10–35)
Albumin: 4.5 g/dL (ref 3.6–5.1)
Alkaline phosphatase (APISO): 62 U/L (ref 35–144)
BUN/Creatinine Ratio: 24 (calc) — ABNORMAL HIGH (ref 6–22)
BUN: 55 mg/dL — ABNORMAL HIGH (ref 7–25)
CO2: 30 mmol/L (ref 20–32)
Calcium: 9.3 mg/dL (ref 8.6–10.3)
Chloride: 101 mmol/L (ref 98–110)
Creat: 2.29 mg/dL — ABNORMAL HIGH (ref 0.70–1.22)
Globulin: 2.3 g/dL (calc) (ref 1.9–3.7)
Glucose, Bld: 240 mg/dL — ABNORMAL HIGH (ref 65–99)
Potassium: 4.8 mmol/L (ref 3.5–5.3)
Sodium: 138 mmol/L (ref 135–146)
Total Bilirubin: 0.4 mg/dL (ref 0.2–1.2)
Total Protein: 6.8 g/dL (ref 6.1–8.1)
eGFR: 27 mL/min/{1.73_m2} — ABNORMAL LOW (ref >=60)

## 2021-10-09 LAB — CBC WITH DIFFERENTIAL/PLATELET
Absolute Monocytes: 778 cells/uL (ref 200–950)
Basophils Absolute: 46 cells/uL (ref 0–200)
Basophils Relative: 0.6 %
Eosinophils Absolute: 246 cells/uL (ref 15–500)
Eosinophils Relative: 3.2 %
HCT: 44.7 % (ref 38.5–50.0)
Hemoglobin: 14.8 g/dL (ref 13.2–17.1)
Lymphs Abs: 1771 cells/uL (ref 850–3900)
MCH: 30.1 pg (ref 27.0–33.0)
MCHC: 33.1 g/dL (ref 32.0–36.0)
MCV: 90.9 fL (ref 80.0–100.0)
MPV: 11.1 fL (ref 7.5–12.5)
Monocytes Relative: 10.1 %
Neutro Abs: 4859 cells/uL (ref 1500–7800)
Neutrophils Relative %: 63.1 %
Platelets: 186 10*3/uL (ref 140–400)
RBC: 4.92 10*6/uL (ref 4.20–5.80)
RDW: 12.6 % (ref 11.0–15.0)
Total Lymphocyte: 23 %
WBC: 7.7 10*3/uL (ref 3.8–10.8)

## 2021-10-09 LAB — TSH: TSH: 2.95 mIU/L (ref 0.40–4.50)

## 2021-10-09 LAB — LIPID PANEL
Cholesterol: 177 mg/dL (ref <200)
HDL: 42 mg/dL (ref >=40)
LDL Cholesterol (Calc): 91 mg/dL (calc)
Non-HDL Cholesterol (Calc): 135 mg/dL (calc) — ABNORMAL HIGH (ref <130)
Total CHOL/HDL Ratio: 4.2 (calc) (ref <5.0)
Triglycerides: 327 mg/dL — ABNORMAL HIGH (ref <150)

## 2021-10-09 LAB — HEMOGLOBIN A1C
Hgb A1c MFr Bld: 10.5 % of total Hgb — ABNORMAL HIGH (ref <5.7)
Mean Plasma Glucose: 255 mg/dL
eAG (mmol/L): 14.1 mmol/L

## 2021-10-09 LAB — MAGNESIUM: Magnesium: 2.6 mg/dL — ABNORMAL HIGH (ref 1.5–2.5)

## 2021-10-17 ENCOUNTER — Other Ambulatory Visit: Payer: Self-pay

## 2021-10-17 ENCOUNTER — Ambulatory Visit (INDEPENDENT_AMBULATORY_CARE_PROVIDER_SITE_OTHER): Payer: Medicare Other | Admitting: Adult Health

## 2021-10-17 ENCOUNTER — Encounter: Payer: Self-pay | Admitting: Adult Health

## 2021-10-17 VITALS — BP 130/72 | HR 84 | Temp 97.7°F | Wt 257.0 lb

## 2021-10-17 DIAGNOSIS — N183 Chronic kidney disease, stage 3 unspecified: Secondary | ICD-10-CM | POA: Diagnosis not present

## 2021-10-17 DIAGNOSIS — N289 Disorder of kidney and ureter, unspecified: Secondary | ICD-10-CM | POA: Diagnosis not present

## 2021-10-17 DIAGNOSIS — E1122 Type 2 diabetes mellitus with diabetic chronic kidney disease: Secondary | ICD-10-CM | POA: Diagnosis not present

## 2021-10-17 LAB — BASIC METABOLIC PANEL WITH GFR
BUN/Creatinine Ratio: 15 (calc) (ref 6–22)
BUN: 26 mg/dL — ABNORMAL HIGH (ref 7–25)
CO2: 31 mmol/L (ref 20–32)
Calcium: 9.7 mg/dL (ref 8.6–10.3)
Chloride: 102 mmol/L (ref 98–110)
Creat: 1.79 mg/dL — ABNORMAL HIGH (ref 0.70–1.22)
Glucose, Bld: 168 mg/dL — ABNORMAL HIGH (ref 65–99)
Potassium: 5.4 mmol/L — ABNORMAL HIGH (ref 3.5–5.3)
Sodium: 139 mmol/L (ref 135–146)
eGFR: 37 mL/min/{1.73_m2} — ABNORMAL LOW (ref >=60)

## 2021-10-17 LAB — MAGNESIUM: Magnesium: 2.1 mg/dL (ref 1.5–2.5)

## 2021-10-17 NOTE — Progress Notes (Signed)
CLOSE FOLLOW UP  Assessment and Plan:     Essential hypertension Continue current medications Try taking furosemide every other day or PRN if able Start monitoring BP and for edema closely Monitor blood pressure at home; call if consistently over 140/80 Continue DASH diet.   Reminder to go to the ER if any CP, SOB, nausea, dizziness, severe HA, changes vision/speech, left arm numbness and tingling and jaw pain. -     COMPLETE METABOLIC PANEL WITH GFR -     Magnesium  Type 2 diabetes mellitus with stage 3b chronic kidney disease, without long-term current use of insulin (HCC) Continue glimepiride - 4 mg BID, newly on ozempic and tapering up He was given assistance paperwork and will bring back ASAP Off of metformin due to advancing CKD Long discussion about dietary choices and physical activity He is resistant to further med changes at this time, set initial goal of fasting <170 then <150, he states is able to hit initial goal within a few weeks with dietary/lifestyle changes - close follow up in 4 weeks Perform daily foot/skin check, notify office of any concerning changes.  Check A1C Q30m  Deterioration in renal functions Stage 3 chronic kidney disease due to type 2 diabetes mellitus ([place]) He did not make any changes but on review not taking NSAIDs, drinking plenty of fluids Blood sugars are severely out of control, see above plan  ? If can hold diuretic, declines med changes at this visit Recheck BMP/GFR and will have follow up with est nephrology if not improved  Medication management Still taking supplement - recheck levels, stop if remains elevated Magnesium  Orders Placed This Encounter  Procedures   BASIC METABOLIC PANEL WITH GFR   Magnesium      Continue diet and meds as discussed. Further disposition pending results of labs. Discussed med's effects and SE's.   Over 30 minutes of exam, counseling, chart review, and critical decision making was performed.    Future Appointments  Date Time Provider [place]  11/14/2021  9:30 AM Liane Comber, NP [facility] None  01/16/2022  9:30 AM Unk Pinto, MD [facility] None  07/09/2022  9:00 AM Magda Bernheim, NP [facility] None  10/08/2022 11:00 AM Liane Comber, NP [facility] None    -------------------------------------------------------------------------------------------------------------------------  HPI 87 y.o. male  presents for close follow up on poorly controlled T2DM and acute renal decline.   He reports unsure why he is here; hasn't changed anything since last visit  BMI is Body mass index is 37.95 kg/m., he has not been working on diet and exercise. Wt Readings from Last 3 Encounters:  10/17/21 257 lb (116.6 kg)  10/08/21 252 lb 3.2 oz (114.4 kg)  07/09/21 253 lb 6.4 oz (114.9 kg)   He does not check his BP at home, today their BP is BP: 130/72  BP Readings from Last 3 Encounters:  10/17/21 130/72  10/08/21 128/84  07/09/21 130/80     He does workout, limited to ~1 mile of walking when knees allow. He denies chest pain, shortness of breath, dizziness.   He has not been working on diet and exercise for DMII and has CKD stage 3 P neuropathy, Bilateral lower extremities Hyperlipidemia on pravastatin On ASA, ACEi Off metformin due to GFR <45 Recently stopped trulicity 1.5 and restarted ozempic which he perceived worked better [employer] $RemoveBeforeDEI'4mg'[id number]$  BID Meter: [employer] daily, reports fasting has been persistently 200+, worse since switching to trulicity Has NOT made dietary changes, admits to eating brownies, donuts,  etc that family supplies; he reports has been able to make dramatic improvements in the past Denies hyperglycemia, hypoglycemia , increased appetite, nausea, polydipsia and polyuria.  Last A1C in the office wasl:  Lab Results  Component Value Date   HGBA1C 10.5 (H) 10/08/2021    He has CKD IIIb associated with T2DM, follows with [facility] -  last 01/23/2021 -no microalbuminuria:  Lab Results  Component Value Date   EGFR 27 (L) 10/08/2021   EGFR 38 (L) 07/09/2021     Current Medications:  Current Outpatient Medications on File Prior to Visit  Medication Sig   aspirin EC 81 MG tablet Take 81 mg by mouth at bedtime.    Blood Glucose Monitoring Suppl (ONE TOUCH ULTRA 2) w/Device KIT check blood sugar 1 time daily-E11.21   Cholecalciferol (VITAMIN D-3) 25 MCG (1000 UT) CAPS Take 1,000 Units by mouth at bedtime.   Cinnamon 500 MG TABS Take 1,000 mg by mouth daily.    doxazosin (CARDURA) 4 MG tablet Take  1 tablet  at Bedtime  for BP & Prostate   famotidine (PEPCID) 40 MG tablet Take 1 tablet Daily for Indigestion & Heartburn (Patient taking differently: Take 40 mg by mouth daily.)   furosemide (LASIX) 40 MG tablet Take 1 tablet Daily for BP and Fluid Retention / Ankle Swelling   glimepiride (AMARYL) 4 MG tablet TAKE 1/2 TO 1 TABLET BY MOUTH TWICE DAILY WITH MEALS FOR DIABETES   glucose blood (ONETOUCH ULTRA) test strip USE TO CHECK BLOOD SUGAR ONCE DAILY.   Lancets (ONETOUCH ULTRASOFT) lancets Check blood sugar 1 time daily-E11.21   Multiple Vitamins-Minerals (ONE-A-DAY MENS 50+ ADVANTAGE) TABS Take 1 tablet by mouth daily with breakfast.   olmesartan (BENICAR) 40 MG tablet Take  1 tablet  Daily  for BP & Diabetic Kidney Protection  (Replaces Lisinopril)   Omega-3 Fatty Acids (FISH OIL) 1200 MG CAPS Take 1,200 mg by mouth daily.   pravastatin (PRAVACHOL) 40 MG tablet Take  1 tablet  at Bedtime  for Cholesterol   Semaglutide,0.25 or 0.5MG /DOS, (OZEMPIC, 0.25 OR 0.5 MG/DOSE,) 2 MG/1.5ML SOPN Start by injecting 0.25 mg into skin of stomach once weekly for 2 weeks then increase to 0.5 mg weekly.   No current facility-administered medications on file prior to visit.    Medical History:  Past Medical History:  Diagnosis Date   AKI (acute kidney injury) ([place]) 04/20/2020   Arthritis    BPH (benign prostatic hyperplasia)    Cataract     CKD (chronic kidney disease), stage III ([place])    Depression    "usually a happy go lucky"   Diabetes mellitus    Diverticulosis    GERD (gastroesophageal reflux disease)    sometimes will take omeprazole   Hyperlipidemia    Hypertension    Sepsis secondary to UTI ([place]) 05/04/2020   Allergies Allergies  Allergen Reactions   Tramadol Other (See Comments)    "Made his head feel funny"    SURGICAL HISTORY He  has a past surgical history that includes Tonsillectomy and adenoidectomy; Vein ligation and stripping; Knee arthroscopy; Colonoscopy (2005, 08/21/11); Fracture surgery; Cholecystectomy (N/A, 11/29/2015); and Cataract extraction, bilateral (Bilateral, 2020). FAMILY HISTORY His family history includes Diabetes in his brother; Prostate cancer in his brother. SOCIAL HISTORY He  reports that he has been smoking cigars. He has never used smokeless tobacco. He reports current alcohol use. He reports that he does not use drugs.  Immunization History  Administered Date(s) Administered   DT (  Pediatric) 08/31/2015   Influenza Whole 05/24/2011, 08/06/2012, 07/07/2013   Influenza, High Dose Seasonal PF 06/21/2014, 08/31/2015, 06/03/2016, 07/15/2017, 07/29/2018, 07/04/2019, 07/02/2020, 07/09/2021   PFIZER(Purple Top)SARS-COV-2 Vaccination 10/15/2019, 11/06/2019, 08/30/2020   PNEUMOCOCCAL CONJUGATE-20 07/09/2021   Pneumococcal Conjugate-13 06/21/2014   Pneumococcal Polysaccharide-23 03/10/2005   Health Maintenance  Topic Date Due   OPHTHALMOLOGY EXAM  11/09/2019   COVID-19 Vaccine (4 - Booster for Pfizer series) 10/25/2020   Zoster Vaccines- Shingrix (1 of 2) 01/06/2022 (Originally 04/06/1954)   HEMOGLOBIN A1C  04/07/2022   FOOT EXAM  10/08/2022   TETANUS/TDAP  08/30/2025   Pneumonia Vaccine 33+ Years old  Completed   INFLUENZA VACCINE  Completed   HPV VACCINES  Aged Out   COLONOSCOPY (Pts 45-106yrs Insurance coverage will need to be confirmed)  Discontinued    Last colonoscopy:  2012 was due 2015 but declined another, also declines hemoccult  Eye exam: Dr. Einar Gip, ? In 2021, OVERDUE, reminded to schedule Dentist: 2022 has full upper partials.   Patient Care Team: Unk Pinto, MD as PCP - General (Internal Medicine) Marlou Sa Tonna Corner, MD as Consulting Physician (Orthopedic Surgery) Newton Pigg, [facility] as Pharmacist (Pharmacist)   MEDICARE WELLNESS OBJECTIVES: Physical activity:   Cardiac risk factors:   Depression/mood screen:   Depression screen [facility] 2/9 10/08/2021  Decreased Interest 0  Down, Depressed, Hopeless 0  PHQ - 2 Score 0    ADLs:  In your present state of health, do you have any difficulty performing the following activities: 10/08/2021  Hearing? N  Vision? N  Difficulty concentrating or making decisions? N  Walking or climbing stairs? N  Comment slowly with steps  Dressing or bathing? N  Doing errands, shopping? N  Some recent data might be hidden     Cognitive Testing  Alert? Yes  Normal Appearance?Yes  Oriented to person? Yes  Place? Yes   Time? Yes  Recall of three objects?  Yes  Can perform simple calculations? Yes  Displays appropriate judgment?Yes  Can read the correct time from a watch face?Yes  EOL planning:        Review of Systems  Constitutional:  Negative for chills and fever.  HENT:  Negative for congestion, hearing loss, sinus pain, sore throat and tinnitus.   Eyes:  Negative for blurred vision and double vision.  Respiratory:  Positive for shortness of breath (on exertion, chronic unchanged). Negative for cough, hemoptysis, sputum production and wheezing.   Cardiovascular:  Negative for chest pain, palpitations and leg swelling.  Gastrointestinal:  Negative for abdominal pain, constipation, diarrhea, heartburn, nausea and vomiting.  Genitourinary:  Negative for dysuria and urgency.       Gets up 2 times at night  Musculoskeletal:  Positive for joint pain (knees). Negative for back pain, falls, myalgias  and neck pain.  Skin:  Negative for rash.  Neurological:  Negative for dizziness, tingling, tremors, weakness and headaches.  Endo/Heme/Allergies:  Does not bruise/bleed easily.  Psychiatric/Behavioral:  Negative for depression and suicidal ideas. The patient is not nervous/anxious and does not have insomnia.      Physical Exam: BP 130/72    Pulse 84    Temp 97.7 F (36.5 C)    Wt 257 lb (116.6 kg)    SpO2 97%    BMI 37.95 kg/m  Wt Readings from Last 3 Encounters:  10/17/21 257 lb (116.6 kg)  10/08/21 252 lb 3.2 oz (114.4 kg)  07/09/21 253 lb 6.4 oz (114.9 kg)   General Appearance: Well nourished, obese, in  no apparent distress. Eyes: PERRLA, EOMs, conjunctiva no swelling or erythema Sinuses: No Frontal/maxillary tenderness ENT/Mouth: Ext aud canals clear, L TMs without erythema, bulging, R TM with extensive scarring, erythematous around bony structures (per the patient he has chronic drainage issue that has been cleared by ENT). No erythema, swelling, or exudate on post pharynx.  Tonsils not swollen or erythematous. Hearing normal.  Neck: Supple, thyroid normal.  Respiratory: Respiratory effort normal, BS equal bilaterally without rales, rhonchi, wheezing or stridor.  Cardio: RRR with no MRGs. 1+ peripheral pulses equally with 1+ non-pitting edema bilateral ankles.  Abdomen: Soft, rounded obese abdomen + BS.  Non tender, no guarding, rebound, palpable hernias or masses. Lymphatics: Non tender without lymphadenopathy.  Musculoskeletal: BIl knees with bony enlargement, crepitus, no effusion, 5/5 strength, antalgic gait Skin: Warm, dry without rashes, lesions, ecchymosis. Flaking skin of feet bilaterally, thickened first digit toe nails.  Neuro: Cranial nerves intact. No cerebellar symptoms. Sensation in feel intact bilaterally to monofilament testing.  Psych: Awake and oriented X 3, normal affect, Insight and Judgment appropriate.    Izora Ribas, NP 12:57 PM [facility]

## 2021-10-21 ENCOUNTER — Telehealth: Payer: Self-pay

## 2021-10-21 ENCOUNTER — Other Ambulatory Visit: Payer: Self-pay

## 2021-10-21 DIAGNOSIS — E1129 Type 2 diabetes mellitus with other diabetic kidney complication: Secondary | ICD-10-CM

## 2021-10-21 MED ORDER — OZEMPIC (0.25 OR 0.5 MG/DOSE) 2 MG/1.5ML ~~LOC~~ SOPN: PEN_INJECTOR | SUBCUTANEOUS | 0 refills | Status: DC

## 2021-10-21 NOTE — Telephone Encounter (Signed)
Callt pt. To r/s visit w/ CPP. Pt did not pick up.   Total time spent: 2 minutes Vanetta Shawl, [facility]

## 2021-10-23 ENCOUNTER — Telehealth: Payer: Self-pay

## 2021-10-23 NOTE — Telephone Encounter (Signed)
Called patient to r/s initial visit w/ CPP. Patient did not answer. Third attempt calling.VM box not set up, so I was unable to leave VM.  Total time spent: 2 Minutes Vanetta Shawl, [facility]

## 2021-11-04 ENCOUNTER — Telehealth: Payer: Self-pay

## 2021-11-04 NOTE — Telephone Encounter (Signed)
Patient assistance approved for Ozempic

## 2021-11-13 NOTE — Progress Notes (Signed)
CLOSE FOLLOW UP  Assessment and Plan:     Essential hypertension Continue current medications Start monitoring BP and for edema closely Monitor blood pressure at home; call if consistently over 140/80 Continue DASH diet.   Reminder to go to the ER if any CP, SOB, nausea, dizziness, severe HA, changes vision/speech, left arm numbness and tingling and jaw pain. -     BMP/GFR -     Magnesium  Type 2 diabetes mellitus with stage 3b chronic kidney disease, without long-term current use of insulin (HCC) Continue glimepiride - 4-8 mg daily, newly on ozempic and doing well at 0.5 mg, increase to 1 mg/week next refill with financial assistance program Off of metformin due to advancing CKD Long discussion about dietary choices and physical activity He is making progress with lifestyle changes; continue with goal to taper off of glimepiride; if fasting trending <150 reduce further to 2 mg in AM Perform daily foot/skin check, notify office of any concerning changes.  Check A1C Q83m  Deterioration in renal functions Stage 3 chronic kidney disease due to type 2 diabetes mellitus ([place]) Improved to baseline Ongoing need for improved glucose control - see above plan Recheck BMP/GFR   Orders Placed This Encounter  Procedures   BASIC METABOLIC PANEL WITH GFR   Magnesium   Fructosamine     Continue diet and meds as discussed. Further disposition pending results of labs. Discussed med's effects and SE's.   Over 30 minutes of exam, counseling, chart review, and critical decision making was performed.   Future Appointments  Date Time Provider [place]  01/16/2022  9:30 AM Unk Pinto, MD [facility] None  07/09/2022  9:00 AM Magda Bernheim, NP [facility] None  10/08/2022 11:00 AM Liane Comber, NP [facility] None    -------------------------------------------------------------------------------------------------------------------------  HPI 87 y.o. male  presents for close  follow up on poorly controlled T2DM and CKD III/renal functions.   BMI is Body mass index is 37.07 kg/m., he has been working on diet and exercise. He is watching salt. He reports switched to 45 cal whole wheat bread but limiting/very little, has reduced potatoes/portions, will do rare cornbread with beans and cabbage. Will limit desserts and sweets.  Wt Readings from Last 3 Encounters:  11/14/21 251 lb (113.9 kg)  10/17/21 257 lb (116.6 kg)  10/08/21 252 lb 3.2 oz (114.4 kg)   He does not check his BP at home, today their BP is BP: 130/70  BP Readings from Last 3 Encounters:  11/14/21 130/70  10/17/21 130/72  10/08/21 128/84     He does workout, will walk when knees allow and stays busy around the home and garden, works on cars. He denies chest pain, shortness of breath, dizziness.   He has not been working on diet and exercise for DMII and has CKD stage 3 P neuropathy, Bilateral lower extremities Hyperlipidemia on pravastatin typically at goal LDL <70 On ASA, ACEi Off metformin due to GFR <45 Recently stopped trulicity 1.5 and restarted ozempic which he perceived better benefit, will be shipped to our office, up to 0.5 mg and tolerating well Glimepride $RemoveBeforeD'4mg'[id number]$  BID, will hold second dose if fasting <170 Meter: Freestyle Checks daily, last report fasting has been persistently 200+ In the last few weeks reports averaging 170, 50-60 points down from last check, over 200 is rare Denies hypoglycemia , increased appetite, nausea, polydipsia, and polyuria.  Eye exam: Dr. Einar Gip, ? In 2021, OVERDUE, reminded to schedule Last A1C in the office wasl:  Lab Results  Component Value Date   HGBA1C 10.5 (H) 10/08/2021    He has CKD IIIb associated with T2DM, follows with [facility] - last 01/23/2021 -no microalbuminuria, he is on olmesartan 40 mg daily :  Lab Results  Component Value Date   EGFR 37 (L) 10/17/2021   EGFR 27 (L) 10/08/2021   EGFR 38 (L) 07/09/2021     Current  Medications:  Current Outpatient Medications on File Prior to Visit  Medication Sig   aspirin EC 81 MG tablet Take 81 mg by mouth at bedtime.    Blood Glucose Monitoring Suppl (ONE TOUCH ULTRA 2) w/Device KIT check blood sugar 1 time daily-E11.21   Cholecalciferol (VITAMIN D-3) 25 MCG (1000 UT) CAPS Take 1,000 Units by mouth at bedtime.   Cinnamon 500 MG TABS Take 1,000 mg by mouth daily.    doxazosin (CARDURA) 4 MG tablet Take  1 tablet  at Bedtime  for BP & Prostate   famotidine (PEPCID) 40 MG tablet Take 1 tablet Daily for Indigestion & Heartburn (Patient taking differently: Take 40 mg by mouth daily.)   furosemide (LASIX) 40 MG tablet Take 1 tablet Daily for BP and Fluid Retention / Ankle Swelling   glimepiride (AMARYL) 4 MG tablet TAKE 1/2 TO 1 TABLET BY MOUTH TWICE DAILY WITH MEALS FOR DIABETES   glucose blood (ONETOUCH ULTRA) test strip USE TO CHECK BLOOD SUGAR ONCE DAILY.   Lancets (ONETOUCH ULTRASOFT) lancets Check blood sugar 1 time daily-E11.21   Multiple Vitamins-Minerals (ONE-A-DAY MENS 50+ ADVANTAGE) TABS Take 1 tablet by mouth daily with breakfast.   olmesartan (BENICAR) 40 MG tablet Take  1 tablet  Daily  for BP & Diabetic Kidney Protection  (Replaces Lisinopril)   Omega-3 Fatty Acids (FISH OIL) 1200 MG CAPS Take 1,200 mg by mouth daily.   pravastatin (PRAVACHOL) 40 MG tablet Take  1 tablet  at Bedtime  for Cholesterol   Semaglutide,0.25 or 0.5MG /DOS, (OZEMPIC, 0.25 OR 0.5 MG/DOSE,) 2 MG/1.5ML SOPN Start by injecting 0.25 mg into skin of stomach once weekly for 2 weeks then increase to 0.5 mg weekly.   No current facility-administered medications on file prior to visit.    Medical History:  Past Medical History:  Diagnosis Date   AKI (acute kidney injury) ([place]) 04/20/2020   Arthritis    BPH (benign prostatic hyperplasia)    Cataract    CKD (chronic kidney disease), stage III ([place])    Depression    "usually a happy go lucky"   Diabetes mellitus    Diverticulosis     GERD (gastroesophageal reflux disease)    sometimes will take omeprazole   Hyperlipidemia    Hypertension    Sepsis secondary to UTI ([place]) 05/04/2020   Allergies Allergies  Allergen Reactions   Tramadol Other (See Comments)    "Made his head feel funny"    SURGICAL HISTORY He  has a past surgical history that includes Tonsillectomy and adenoidectomy; Vein ligation and stripping; Knee arthroscopy; Colonoscopy (2005, 08/21/11); Fracture surgery; Cholecystectomy (N/A, 11/29/2015); and Cataract extraction, bilateral (Bilateral, 2020). FAMILY HISTORY His family history includes Diabetes in his brother; Prostate cancer in his brother. SOCIAL HISTORY He  reports that he has been smoking cigars. He has never used smokeless tobacco. He reports current alcohol use. He reports that he does not use drugs.  Review of Systems  Constitutional:  Negative for chills and fever.  HENT:  Negative for congestion, hearing loss, sinus pain, sore throat and tinnitus.   Eyes:  Negative for blurred  vision and double vision.  Respiratory:  Positive for shortness of breath (on exertion, chronic unchanged). Negative for cough, hemoptysis, sputum production and wheezing.   Cardiovascular:  Negative for chest pain, palpitations and leg swelling.  Gastrointestinal:  Negative for abdominal pain, constipation, diarrhea, heartburn, nausea and vomiting.  Genitourinary:  Negative for dysuria and urgency.       Gets up 2 times at night  Musculoskeletal:  Positive for joint pain (knees). Negative for back pain, falls, myalgias and neck pain.  Skin:  Negative for rash.  Neurological:  Negative for dizziness, tingling, tremors, weakness and headaches.  Endo/Heme/Allergies:  Does not bruise/bleed easily.  Psychiatric/Behavioral:  Negative for depression and suicidal ideas. The patient is not nervous/anxious and does not have insomnia.      Physical Exam: BP 130/70    Pulse 91    Temp 97.7 F (36.5 C)    Wt 251 lb (113.9  kg)    SpO2 95%    BMI 37.07 kg/m  Wt Readings from Last 3 Encounters:  11/14/21 251 lb (113.9 kg)  10/17/21 257 lb (116.6 kg)  10/08/21 252 lb 3.2 oz (114.4 kg)   General Appearance: Well nourished, obese, in no apparent distress. Eyes: PERRLA, EOMs, conjunctiva no swelling or erythema Sinuses: No Frontal/maxillary tenderness ENT/Mouth: Ext aud canals clear, L TMs without erythema, bulging, R TM with extensive scarring, erythematous around bony structures (per the patient he has chronic drainage issue that has been cleared by ENT). No erythema, swelling, or exudate on post pharynx.  Tonsils not swollen or erythematous. Hearing normal.  Neck: Supple, thyroid normal.  Respiratory: Respiratory effort normal, BS equal bilaterally without rales, rhonchi, wheezing or stridor.  Cardio: RRR with no MRGs. 1+ peripheral pulses equally with 1+ non-pitting edema bilateral ankles.  Abdomen: Soft, rounded obese abdomen + BS.  Non tender, no guarding, rebound, palpable hernias or masses. Lymphatics: Non tender without lymphadenopathy.  Musculoskeletal: BIl knees with bony enlargement, crepitus, no effusion, 5/5 strength, antalgic gait Skin: Warm, dry without rashes, lesions, ecchymosis. Flaking skin of feet bilaterally, thickened first digit toe nails.  Neuro: Cranial nerves intact. No cerebellar symptoms. Sensation in feel intact bilaterally to monofilament testing.  Psych: Awake and oriented X 3, normal affect, Insight and Judgment appropriate.    Edwin Ribas, NP 10:06 AM [facility]

## 2021-11-14 ENCOUNTER — Encounter: Payer: Self-pay | Admitting: Adult Health

## 2021-11-14 ENCOUNTER — Ambulatory Visit (INDEPENDENT_AMBULATORY_CARE_PROVIDER_SITE_OTHER): Payer: Medicare Other | Admitting: Adult Health

## 2021-11-14 ENCOUNTER — Other Ambulatory Visit: Payer: Self-pay

## 2021-11-14 VITALS — BP 130/70 | HR 91 | Temp 97.7°F | Wt 251.0 lb

## 2021-11-14 DIAGNOSIS — E1122 Type 2 diabetes mellitus with diabetic chronic kidney disease: Secondary | ICD-10-CM | POA: Diagnosis not present

## 2021-11-14 DIAGNOSIS — E1165 Type 2 diabetes mellitus with hyperglycemia: Secondary | ICD-10-CM

## 2021-11-14 DIAGNOSIS — I10 Essential (primary) hypertension: Secondary | ICD-10-CM | POA: Diagnosis not present

## 2021-11-14 DIAGNOSIS — E1129 Type 2 diabetes mellitus with other diabetic kidney complication: Secondary | ICD-10-CM | POA: Diagnosis not present

## 2021-11-14 DIAGNOSIS — N183 Chronic kidney disease, stage 3 unspecified: Secondary | ICD-10-CM

## 2021-11-14 MED ORDER — OZEMPIC (0.25 OR 0.5 MG/DOSE) 2 MG/1.5ML ~~LOC~~ SOPN: PEN_INJECTOR | SUBCUTANEOUS | 0 refills | Status: DC

## 2021-11-20 LAB — BASIC METABOLIC PANEL WITH GFR
BUN/Creatinine Ratio: 13 (calc) (ref 6–22)
BUN: 22 mg/dL (ref 7–25)
CO2: 29 mmol/L (ref 20–32)
Calcium: 9.6 mg/dL (ref 8.6–10.3)
Chloride: 101 mmol/L (ref 98–110)
Creat: 1.7 mg/dL — ABNORMAL HIGH (ref 0.70–1.22)
Glucose, Bld: 176 mg/dL — ABNORMAL HIGH (ref 65–99)
Potassium: 4.9 mmol/L (ref 3.5–5.3)
Sodium: 138 mmol/L (ref 135–146)
eGFR: 39 mL/min/{1.73_m2} — ABNORMAL LOW (ref >=60)

## 2021-11-20 LAB — FRUCTOSAMINE: Fructosamine: 284 umol/L (ref 205–285)

## 2021-11-20 LAB — MAGNESIUM: Magnesium: 1.9 mg/dL (ref 1.5–2.5)

## 2021-12-22 ENCOUNTER — Other Ambulatory Visit: Payer: Self-pay | Admitting: Nurse Practitioner

## 2021-12-23 ENCOUNTER — Other Ambulatory Visit: Payer: Self-pay | Admitting: Internal Medicine

## 2021-12-26 ENCOUNTER — Other Ambulatory Visit: Payer: Self-pay | Admitting: Internal Medicine

## 2022-01-15 ENCOUNTER — Encounter: Payer: Self-pay | Admitting: Internal Medicine

## 2022-01-15 NOTE — Patient Instructions (Signed)

## 2022-01-15 NOTE — Progress Notes (Signed)
? ?Future Appointments  ?Date Time Provider Department  ?01/16/2022  9:30 AM Unk Pinto, MD [facility]  ?07/09/2022           CPE  9:00 AM Magda Bernheim, NP [facility]  ?10/08/2022            Wellness 11:00 AM Liane Comber, NP [facility]  ? ? ?History of Present Illness: ? ? ?    This very nice 87 y.o. MWM presents for 3 month follow up with HTN, HLD, Pre-Diabetes and Vitamin D Deficiency.  Abd CT scan in 2018 found Aortic Atherosclerosis.  ? ? ?    Patient is treated for HTN  since 1985  & BP has been controlled at home. Today?s BP was initially elevated & rechecked at goal  - 136/86. Patient has had no complaints of any cardiac type chest pain, palpitations, dyspnea / orthopnea / PND, dizziness, claudication, or dependent edema. ? ? ?    Hyperlipidemia is controlled with diet & meds. Patient denies myalgias or other med SE?s. Last Lipids were at goal except elevated Trig's : ? ?Lab Results  ?Component Value Date  ? CHOL 177 10/08/2021  ? HDL 42 10/08/2021  ? [place] 91 10/08/2021  ? TRIG 327 (H) 10/08/2021  ? CHOLHDL 4.2 10/08/2021  ? ? ? ?Also, the patient has morbid Obesity (BMI 37+)  and  history of T2_NIDDM W/CKD 3b (GFR 39)  and has had no symptoms of reactive hypoglycemia, diabetic polys, paresthesias or visual blurring.  Last A1c was 10.5% in mid Jan in poor control consequent of his poor compliance &  poor diet. In Mid Feb, control was improved with Fructosamine = 284  approximating  an A1c of about 6.8% !  ? ?Lab Results  ?Component Value Date  ? HGBA1C 10.5 (H) 10/08/2021  ? ?    ? ?                                                 Further, the patient also has history of Vitamin D Deficiency and supplements vitamin D . Last vitamin D was at goal : ? ? ?Lab Results  ?Component Value Date  ? VD25OH 62 07/09/2021  ? ? ? ?Current Outpatient Medications on File Prior to Visit  ?Medication Sig  ? aspirin EC 81 MG tablet Take 81 mg by mouth at bedtime.   ? VITAMIN D 1,000 u Take at bedtime.  ? Cinnamon  500 MG TABS Take 1,000 mg daily.   ? doxazosin  4 MG tablet Take  1 tablet  at Bedtime  for BP & Prostate  ? famotidine  40 MG tablet Take 1 tablet Daily   ? furosemide  40 MG tablet Take 1 tablet Daily   ? glimepiride 4 MG tablet TAKE 1/2 TO 1 TAB  TWICE DAILY   ? Multiple Vitamins-Minerals  Take 1 tablet daily with breakfast.  ? olmesartan 40 MG tablet Take  1 tablet  Daily   ? Omega-3 FISH OIL 1200 MG  Take  daily.  ? pravastatin (40 MG tablet Take  1 tablet  at Bedtime  for Cholesterol  ? Semaglutide,0.25 or 0.'5MG'$ /DOS, (OZEMPIC, 0.25 OR 0.5 MG/DOSE,) 2 MG/1.5ML SOPN Inject 0.5 mg into skin of stomach once weekly for diabetes.  ? ? ? ?Allergies  ?Allergen Reactions  ? Tramadol Other (See Comments)  ?  "  Made his head feel funny"  ? ? ? ?PMHx:   ?Past Medical History:  ?Diagnosis Date  ? AKI (acute kidney injury) ([place]) 04/20/2020  ? Arthritis   ? BPH (benign prostatic hyperplasia)   ? Cataract   ? CKD (chronic kidney disease), stage III ([place])   ? Depression   ? "usually a happy go lucky"  ? Diabetes mellitus   ? Diverticulosis   ? GERD (gastroesophageal reflux disease)   ? sometimes will take omeprazole  ? Hyperlipidemia   ? Hypertension   ? Sepsis secondary to UTI ([place]) 05/04/2020  ? ? ? ?Immunization History  ?Administered Date(s) Administered  ? DT (Pediatric) 08/31/2015  ? Influenza Whole 05/24/2011, 08/06/2012, 07/07/2013  ? Influenza, High Dose  07/29/2018, 07/04/2019, 07/02/2020, 07/09/2021  ? PFIZER SARS-COV-2 Vacc 10/15/2019, 11/06/2019, 08/30/2020  ? PNEUMOCOCCAL -20 07/09/2021  ? Pneumococcal  -13 06/21/2014  ? Pneumococcal -23 03/10/2005  ? ? ? ?Past Surgical History:  ?Procedure Laterality Date  ? CATARACT EXTRACTION, BILATERAL Bilateral 2020  ? Dr. Einar Gip  ? CHOLECYSTECTOMY N/A 11/29/2015  ? Procedure: LAPAROSCOPIC CHOLECYSTECTOMY WITH INTRAOPERATIVE CHOLANGIOGRAM;  Surgeon: Erroll Luna, MD;  Location: [facility] OR;  Service: General;  Laterality: N/A;  ? COLONOSCOPY  2005, 08/21/11  ? 2005 tubulovillous  adenoma and adenomas 2012 - 3 adenomas, largest 10 mm, diverticulosis  ? FRACTURE SURGERY    ? ? right foot as a child  ? KNEE ARTHROSCOPY    ? TONSILLECTOMY AND ADENOIDECTOMY    ? VEIN LIGATION AND STRIPPING    ? left  ? ? ?FHx:    Reviewed / unchanged ? ?SHx:    Reviewed / unchanged  ? ?Systems Review: ? ?Constitutional: Denies fever, chills, wt changes, headaches, insomnia, fatigue, night sweats, change in appetite. ?Eyes: Denies redness, blurred vision, diplopia, discharge, itchy, watery eyes.  ?ENT: Denies discharge, congestion, post nasal drip, epistaxis, sore throat, earache, hearing loss, dental pain, tinnitus, vertigo, sinus pain, snoring.  ?CV: Denies chest pain, palpitations, irregular heartbeat, syncope, dyspnea, diaphoresis, orthopnea, PND, claudication or edema. ?Respiratory: denies cough, dyspnea, DOE, pleurisy, hoarseness, laryngitis, wheezing.  ?Gastrointestinal: Denies dysphagia, odynophagia, heartburn, reflux, water brash, abdominal pain or cramps, nausea, vomiting, bloating, diarrhea, constipation, hematemesis, melena, hematochezia  or hemorrhoids. ?Genitourinary: Denies dysuria, frequency, urgency, nocturia, hesitancy, discharge, hematuria or flank pain. ?Musculoskeletal: Denies arthralgias, myalgias, stiffness, jt. swelling, pain, limping or strain/sprain.  ?Skin: Denies pruritus, rash, hives, warts, acne, eczema or change in skin lesion(s). ?Neuro: No weakness, tremor, incoordination, spasms, paresthesia or pain. ?Psychiatric: Denies confusion, memory loss or sensory loss. ?Endo: Denies change in weight, skin or hair change.  ?Heme/Lymph: No excessive bleeding, bruising or enlarged lymph nodes. ? ?Physical Exam ? ?BP 136/86   Pulse 70   Temp 97.9 ?F (36.6 ?C)   Resp 17   Ht '5\' 9"'$  (1.753 m)   Wt 257 lb 9.6 oz (116.8 kg)   SpO2 98%   BMI 38.04 kg/m?  ? ?Appears  over nourished, well groomed  and in no distress. ? ?Eyes: PERRLA, EOMs, conjunctiva no swelling or erythema. ?Sinuses: No  frontal/maxillary tenderness ?ENT/Mouth: EAC's clear, TM's nl w/o erythema, bulging. Nares clear w/o erythema, swelling, exudates. Oropharynx clear without erythema or exudates. Oral hygiene is good. Tongue normal, non obstructing. Hearing intact.  ?Neck: Supple. Thyroid not palpable. Car 2+/2+ without bruits, nodes or JVD. ?Chest: Respirations nl with BS clear & equal w/o rales, rhonchi, wheezing or stridor.  ?Cor: Heart sounds normal w/ regular rate and rhythm without sig. murmurs, gallops,  clicks or rubs. Peripheral pulses normal and equal  without edema.  ?Abdomen: Soft & bowel sounds normal. Non-tender w/o guarding, rebound, hernias, masses or organomegaly.  ?Lymphatics: Unremarkable.  ?Musculoskeletal: Full ROM all peripheral extremities, joint stability, 5/5 strength and normal gait.  ?Skin: Warm, dry without exposed rashes, lesions or ecchymosis apparent.  ?Neuro: Cranial nerves intact, reflexes equal bilaterally. Sensory-motor testing grossly intact. Tendon reflexes grossly intact.  ?Pysch: Alert & oriented x 3.  Insight and judgement nl & appropriate. No ideations. ? ?Assessment and Plan: ? ?1. Essential hypertension ? ?- Continue medication, monitor blood pressure at home.  ?- Continue DASH diet.  Reminder to go to the ER if any CP,  ?SOB, nausea, dizziness, severe HA, changes vision/speech. ?   ?- CBC with Differential/Platelet ?- COMPLETE METABOLIC PANEL WITH GFR ?- Magnesium ?- TSH ? ?2.   Hyperlipidemia associated with type 2 diabetes mellitus ([place]) ? ?- Continue diet/meds, exercise,& lifestyle modifications.  ?- Continue monitor periodic cholesterol/liver & renal functions  ? ?- Lipid panel ?- TSH ? ?3. Type 2 diabetes mellitus with stage 3b chronic kidney  ?disease, without long-term current use of insulin ([place]) ? ?- Continue diet, exercise  ?- Lifestyle modifications.  ?- Monitor appropriate labs ?   ?- Hemoglobin A1c ?- Insulin, random ? ?4. Vitamin D deficiency ? ?- Continue supplementation ?     ?- VITAMIN D 25 Hydroxy  ? ?5. Aortic Atherosclerosis ([place]) by Abd CT scan 2018 ? ?- Lipid panel ? ?6. Medication management ? ?- CBC with Differential/Platelet ?- COMPLETE METABOLIC PANEL WITH GFR ?- Magnesium ?-

## 2022-01-16 ENCOUNTER — Ambulatory Visit (INDEPENDENT_AMBULATORY_CARE_PROVIDER_SITE_OTHER): Payer: Medicare Other | Admitting: Internal Medicine

## 2022-01-16 ENCOUNTER — Encounter: Payer: Self-pay | Admitting: Internal Medicine

## 2022-01-16 VITALS — BP 136/86 | HR 70 | Temp 97.9°F | Resp 17 | Ht 69.0 in | Wt 257.6 lb

## 2022-01-16 DIAGNOSIS — Z79899 Other long term (current) drug therapy: Secondary | ICD-10-CM | POA: Diagnosis not present

## 2022-01-16 DIAGNOSIS — E1169 Type 2 diabetes mellitus with other specified complication: Secondary | ICD-10-CM | POA: Diagnosis not present

## 2022-01-16 DIAGNOSIS — N1832 Chronic kidney disease, stage 3b: Secondary | ICD-10-CM | POA: Diagnosis not present

## 2022-01-16 DIAGNOSIS — E1122 Type 2 diabetes mellitus with diabetic chronic kidney disease: Secondary | ICD-10-CM | POA: Diagnosis not present

## 2022-01-16 DIAGNOSIS — E785 Hyperlipidemia, unspecified: Secondary | ICD-10-CM

## 2022-01-16 DIAGNOSIS — E559 Vitamin D deficiency, unspecified: Secondary | ICD-10-CM | POA: Diagnosis not present

## 2022-01-16 DIAGNOSIS — I10 Essential (primary) hypertension: Secondary | ICD-10-CM | POA: Diagnosis not present

## 2022-01-16 DIAGNOSIS — I700 Atherosclerosis of aorta: Secondary | ICD-10-CM

## 2022-01-17 LAB — COMPLETE METABOLIC PANEL WITH GFR
AG Ratio: 2 (calc) (ref 1.0–2.5)
ALT: 15 U/L (ref 9–46)
AST: 13 U/L (ref 10–35)
Albumin: 4.1 g/dL (ref 3.6–5.1)
Alkaline phosphatase (APISO): 53 U/L (ref 35–144)
BUN/Creatinine Ratio: 15 (calc) (ref 6–22)
BUN: 25 mg/dL (ref 7–25)
CO2: 29 mmol/L (ref 20–32)
Calcium: 9.4 mg/dL (ref 8.6–10.3)
Chloride: 103 mmol/L (ref 98–110)
Creat: 1.69 mg/dL — ABNORMAL HIGH (ref 0.70–1.22)
Globulin: 2.1 g/dL (calc) (ref 1.9–3.7)
Glucose, Bld: 174 mg/dL — ABNORMAL HIGH (ref 65–99)
Potassium: 5.1 mmol/L (ref 3.5–5.3)
Sodium: 141 mmol/L (ref 135–146)
Total Bilirubin: 0.5 mg/dL (ref 0.2–1.2)
Total Protein: 6.2 g/dL (ref 6.1–8.1)
eGFR: 40 mL/min/{1.73_m2} — ABNORMAL LOW (ref >=60)

## 2022-01-17 LAB — CBC WITH DIFFERENTIAL/PLATELET
Absolute Monocytes: 792 cells/uL (ref 200–950)
Basophils Absolute: 52 cells/uL (ref 0–200)
Basophils Relative: 0.7 %
Eosinophils Absolute: 562 cells/uL — ABNORMAL HIGH (ref 15–500)
Eosinophils Relative: 7.6 %
HCT: 42.8 % (ref 38.5–50.0)
Hemoglobin: 14 g/dL (ref 13.2–17.1)
Lymphs Abs: 1769 cells/uL (ref 850–3900)
MCH: 29.2 pg (ref 27.0–33.0)
MCHC: 32.7 g/dL (ref 32.0–36.0)
MCV: 89.2 fL (ref 80.0–100.0)
MPV: 11 fL (ref 7.5–12.5)
Monocytes Relative: 10.7 %
Neutro Abs: 4225 cells/uL (ref 1500–7800)
Neutrophils Relative %: 57.1 %
Platelets: 167 10*3/uL (ref 140–400)
RBC: 4.8 10*6/uL (ref 4.20–5.80)
RDW: 13 % (ref 11.0–15.0)
Total Lymphocyte: 23.9 %
WBC: 7.4 10*3/uL (ref 3.8–10.8)

## 2022-01-17 LAB — LIPID PANEL
Cholesterol: 150 mg/dL (ref <200)
HDL: 43 mg/dL (ref >=40)
LDL Cholesterol (Calc): 77 mg/dL (calc)
Non-HDL Cholesterol (Calc): 107 mg/dL (calc) (ref <130)
Total CHOL/HDL Ratio: 3.5 (calc) (ref <5.0)
Triglycerides: 206 mg/dL — ABNORMAL HIGH (ref <150)

## 2022-01-17 LAB — TSH: TSH: 2.94 mIU/L (ref 0.40–4.50)

## 2022-01-17 LAB — MAGNESIUM: Magnesium: 2 mg/dL (ref 1.5–2.5)

## 2022-01-17 LAB — HEMOGLOBIN A1C
Hgb A1c MFr Bld: 9 % of total Hgb — ABNORMAL HIGH (ref <5.7)
Mean Plasma Glucose: 212 mg/dL
eAG (mmol/L): 11.7 mmol/L

## 2022-01-17 LAB — VITAMIN D 25 HYDROXY (VIT D DEFICIENCY, FRACTURES): Vit D, 25-Hydroxy: 60 ng/mL (ref 30–100)

## 2022-01-17 LAB — INSULIN, RANDOM: Insulin: 31.8 u[IU]/mL — ABNORMAL HIGH

## 2022-01-17 NOTE — Progress Notes (Signed)
<><><><><><><><><><><><><><><><><><><><><><><><><><><><><><><><><> ?<><><><><><><><><><><><><><><><><><><><><><><><><><><><><><><><><> ? ?-    Glucose = 174 mg%  is too high     ( Goal is less than 130 ) ?<><><><><><><><><><><><><><><><><><><><><><><><><><><><><><><><><> ? ?- A1c = 9.0 % - Still way too high !  ? ?Being diabetic has a  300% increased risk for heart attack,  ?stroke, cancer, and alzheimer- type vascular dementia.  ? ?It is very important that you work harder with diet by  ?avoiding all foods that are white except chicken,  fish & calliflower. ? ?- Avoid white rice  ?(brown & wild rice is OK),  ? ?- Avoid white potatoes  ?(sweet potatoes in moderation is OK),  ? ?White bread or wheat bread or anything made out of  ? ?white flour like bagels, donuts, rolls, buns, biscuits, cakes, ? ?- pastries, cookies, pizza crust, and pasta (made from  ?white flour & egg whites)  ? ?- vegetarian pasta or spinach or wheat pasta is OK. ? ?- Multigrain breads like Arnold's, Pepperidge Farm or  ? ?multigrain sandwich thins or high fiber breads like  ? ?[place] bread or "Dave's Killer" breads that are  ?4 to 5 grams fiber per slice !  are best.   ? ?Diet, exercise and weight loss can reverse and cure  ?diabetes in the early stages.   ? ?- Diet, exercise and weight loss is very important in the  ? ?control and prevention of complications of diabetes which ? affects every system in your body, ie.  ? ?-Brain - dementia/stroke,  ?- eyes - glaucoma/blindness,  ?- heart - heart attack/heart failure,  ?- kidneys - dialysis,  ?- stomach - gastric paralysis,  ?- intestines - malabsorption,  ?- nerves - severe painful neuritis,  ?- circulation - gangrene & loss of a leg(s)  ?- and finally  . . . . . . . . . . . . . . . . . .   ? ?- cancer and Alzheimers. ? ?<><><><><><><><><><><><><><><><><><><><><><><><><><><><><><><><><> ? ?-  Kidney functions still stage 3b &  Stable ?<><><><><><><><><><><><><><><><><><><><><><><><><><><><><><><><><> ? ?-  Total Chol = 150    &   LDL Chol = 77  - Both  Excellent  ? ?- Very low risk for Heart Attack  / Stroke ?============================================================ ?============================================================ ? ?-  But Triglycerides (  206   ) or fats in blood are too high  ?(goal is less than 150)   ? ?- Recommend avoid fried & greasy foods,  sweets / candy,  ? ?- Avoid white rice  ?(brown or wild rice or Quinoa is OK),  ? ?- Avoid white potatoes  ?(sweet potatoes are OK)  ? ?- Avoid anything made from white flour  ?- bagels, doughnuts, rolls, buns, biscuits, white and  ? ?wheat breads, pizza crust and traditional  ?pasta made of white flour & egg white ? ?- (vegetarian pasta or spinach or wheat pasta is OK).   ? ?- Multi-grain bread is OK - like multi-grain flat bread or ? sandwich thins.  ? ?- Avoid alcohol in excess.  ? ?- Exercise is also important. ?<><><><><><><><><><><><><><><><><><><><><><><><><><><><><><><><><> ? ?-  Vitamin D = 60  - Great   - Please keep dose same  ?<><><><><><><><><><><><><><><><><><><><><><><><><><><><><><><><><> ?<><><><><><><><><><><><><><><><><><><><><><><><><><><><><><><><><> ? ? ? ? ? ? ? ? ? ? ? ?

## 2022-01-20 DIAGNOSIS — N1832 Chronic kidney disease, stage 3b: Secondary | ICD-10-CM | POA: Diagnosis not present

## 2022-01-24 ENCOUNTER — Other Ambulatory Visit: Payer: Self-pay | Admitting: Adult Health

## 2022-01-24 DIAGNOSIS — E1165 Type 2 diabetes mellitus with hyperglycemia: Secondary | ICD-10-CM

## 2022-01-24 MED ORDER — SEMAGLUTIDE (1 MG/DOSE) 4 MG/3ML ~~LOC~~ SOPN: 1.0000 mg | PEN_INJECTOR | SUBCUTANEOUS | 3 refills | Status: DC

## 2022-01-29 DIAGNOSIS — E1122 Type 2 diabetes mellitus with diabetic chronic kidney disease: Secondary | ICD-10-CM | POA: Diagnosis not present

## 2022-01-29 DIAGNOSIS — N1832 Chronic kidney disease, stage 3b: Secondary | ICD-10-CM | POA: Diagnosis not present

## 2022-01-29 DIAGNOSIS — I129 Hypertensive chronic kidney disease with stage 1 through stage 4 chronic kidney disease, or unspecified chronic kidney disease: Secondary | ICD-10-CM | POA: Diagnosis not present

## 2022-01-29 DIAGNOSIS — D631 Anemia in chronic kidney disease: Secondary | ICD-10-CM | POA: Diagnosis not present

## 2022-03-26 ENCOUNTER — Other Ambulatory Visit: Payer: Self-pay

## 2022-03-26 MED ORDER — ONETOUCH ULTRA VI STRP: ORAL_STRIP | 3 refills | Status: DC

## 2022-03-29 ENCOUNTER — Other Ambulatory Visit: Payer: Self-pay | Admitting: Nurse Practitioner

## 2022-04-11 ENCOUNTER — Encounter: Payer: Self-pay | Admitting: Internal Medicine

## 2022-04-26 ENCOUNTER — Other Ambulatory Visit: Payer: Self-pay

## 2022-04-26 ENCOUNTER — Emergency Department (HOSPITAL_BASED_OUTPATIENT_CLINIC_OR_DEPARTMENT_OTHER): Payer: Medicare Other | Admitting: Radiology

## 2022-04-26 ENCOUNTER — Encounter (HOSPITAL_BASED_OUTPATIENT_CLINIC_OR_DEPARTMENT_OTHER): Payer: Self-pay

## 2022-04-26 ENCOUNTER — Emergency Department (HOSPITAL_BASED_OUTPATIENT_CLINIC_OR_DEPARTMENT_OTHER)
Admission: EM | Admit: 2022-04-26 | Discharge: 2022-04-27 | Disposition: A | Discharge status: Home / Self Care | Payer: Medicare Other | Attending: Emergency Medicine | Admitting: Emergency Medicine

## 2022-04-26 DIAGNOSIS — N189 Chronic kidney disease, unspecified: Secondary | ICD-10-CM | POA: Diagnosis not present

## 2022-04-26 DIAGNOSIS — Z20822 Contact with and (suspected) exposure to covid-19: Secondary | ICD-10-CM | POA: Diagnosis not present

## 2022-04-26 DIAGNOSIS — R059 Cough, unspecified: Secondary | ICD-10-CM | POA: Diagnosis not present

## 2022-04-26 DIAGNOSIS — Z7984 Long term (current) use of oral hypoglycemic drugs: Secondary | ICD-10-CM | POA: Insufficient documentation

## 2022-04-26 DIAGNOSIS — I129 Hypertensive chronic kidney disease with stage 1 through stage 4 chronic kidney disease, or unspecified chronic kidney disease: Secondary | ICD-10-CM | POA: Diagnosis not present

## 2022-04-26 DIAGNOSIS — Z794 Long term (current) use of insulin: Secondary | ICD-10-CM | POA: Diagnosis not present

## 2022-04-26 DIAGNOSIS — Z7982 Long term (current) use of aspirin: Secondary | ICD-10-CM | POA: Diagnosis not present

## 2022-04-26 DIAGNOSIS — Z79899 Other long term (current) drug therapy: Secondary | ICD-10-CM | POA: Insufficient documentation

## 2022-04-26 DIAGNOSIS — J9811 Atelectasis: Secondary | ICD-10-CM | POA: Diagnosis not present

## 2022-04-26 DIAGNOSIS — R509 Fever, unspecified: Secondary | ICD-10-CM | POA: Diagnosis not present

## 2022-04-26 DIAGNOSIS — E119 Type 2 diabetes mellitus without complications: Secondary | ICD-10-CM | POA: Diagnosis not present

## 2022-04-26 DIAGNOSIS — J209 Acute bronchitis, unspecified: Secondary | ICD-10-CM | POA: Diagnosis not present

## 2022-04-26 DIAGNOSIS — F17290 Nicotine dependence, other tobacco product, uncomplicated: Secondary | ICD-10-CM | POA: Diagnosis not present

## 2022-04-26 LAB — COMPREHENSIVE METABOLIC PANEL
ALT: 22 U/L (ref 0–44)
AST: 19 U/L (ref 15–41)
Albumin: 4 g/dL (ref 3.5–5.0)
Alkaline Phosphatase: 64 U/L (ref 38–126)
Anion gap: 8 (ref 5–15)
BUN: 29 mg/dL — ABNORMAL HIGH (ref 8–23)
CO2: 25 mmol/L (ref 22–32)
Calcium: 8.9 mg/dL (ref 8.9–10.3)
Chloride: 102 mmol/L (ref 98–111)
Creatinine, Ser: 1.62 mg/dL — ABNORMAL HIGH (ref 0.61–1.24)
GFR, Estimated: 41 mL/min — ABNORMAL LOW (ref >60)
Glucose, Bld: 231 mg/dL — ABNORMAL HIGH (ref 70–99)
Potassium: 4.4 mmol/L (ref 3.5–5.1)
Sodium: 135 mmol/L (ref 135–145)
Total Bilirubin: 0.5 mg/dL (ref 0.3–1.2)
Total Protein: 6.5 g/dL (ref 6.5–8.1)

## 2022-04-26 LAB — CBC WITH DIFFERENTIAL/PLATELET
Abs Immature Granulocytes: 0.09 10*3/uL — ABNORMAL HIGH (ref 0.00–0.07)
Basophils Absolute: 0 10*3/uL (ref 0.0–0.1)
Basophils Relative: 1 %
Eosinophils Absolute: 0.3 10*3/uL (ref 0.0–0.5)
Eosinophils Relative: 4 %
HCT: 41.1 % (ref 39.0–52.0)
Hemoglobin: 13.9 g/dL (ref 13.0–17.0)
Immature Granulocytes: 1 %
Lymphocytes Relative: 18 %
Lymphs Abs: 1.4 10*3/uL (ref 0.7–4.0)
MCH: 29.4 pg (ref 26.0–34.0)
MCHC: 33.8 g/dL (ref 30.0–36.0)
MCV: 87.1 fL (ref 80.0–100.0)
Monocytes Absolute: 1.4 10*3/uL — ABNORMAL HIGH (ref 0.1–1.0)
Monocytes Relative: 18 %
Neutro Abs: 4.5 10*3/uL (ref 1.7–7.7)
Neutrophils Relative %: 58 %
Platelets: 145 10*3/uL — ABNORMAL LOW (ref 150–400)
RBC: 4.72 MIL/uL (ref 4.22–5.81)
RDW: 13.6 % (ref 11.5–15.5)
WBC: 7.7 10*3/uL (ref 4.0–10.5)
nRBC: 0 % (ref 0.0–0.2)

## 2022-04-26 LAB — RESP PANEL BY RT-PCR (FLU A&B, COVID) ARPGX2
Influenza A by PCR: NEGATIVE
Influenza B by PCR: NEGATIVE
SARS Coronavirus 2 by RT PCR: NEGATIVE

## 2022-04-26 NOTE — ED Triage Notes (Signed)
Pt presents to the ED POV with daughter. Reports developing cold like symptoms on Thursday. Reports productive cough, fever, congestion, sore throat, and nasal drainage.

## 2022-04-26 NOTE — ED Provider Notes (Signed)
[facility] Provider Note: Georgena Spurling, MD, FACEP  CSN: [id number] MRN: [id number] ARRIVAL: 04/26/22 at [place]: [id number]   CHIEF COMPLAINT  Fever and Cough   HISTORY OF PRESENT ILLNESS  04/26/22 11:33 PM Edwin Osborne is a 87 y.o. male with 2 days of cold-like symptoms.  Specifically he has had a productive cough, fever (101.3 on arrival), nasal congestion, sore throat and rhinorrhea.  He has not had shortness of breath beyond his baseline.  He is not having urinary symptoms although he has a history of urosepsis.  He has chronic edema of the lower legs and only takes Lasix as needed.  He was given Tylenol by his daughter just prior to arrival.  His temperature currently is 98.3.   Past Medical History:  Diagnosis Date   AKI (acute kidney injury) ([place]) 04/20/2020   Arthritis    BPH (benign prostatic hyperplasia)    Cataract    CKD (chronic kidney disease), stage III ([place])    Depression    "usually a happy go lucky"   Diabetes mellitus    Diverticulosis    GERD (gastroesophageal reflux disease)    sometimes will take omeprazole   Hyperlipidemia    Hypertension    Sepsis secondary to UTI ([place]) 05/04/2020    Past Surgical History:  Procedure Laterality Date   CATARACT EXTRACTION, BILATERAL Bilateral 2020   Dr. Einar Gip   CHOLECYSTECTOMY N/A 11/29/2015   Procedure: LAPAROSCOPIC CHOLECYSTECTOMY WITH INTRAOPERATIVE CHOLANGIOGRAM;  Surgeon: Erroll Luna, MD;  Location: [facility] OR;  Service: General;  Laterality: N/A;   COLONOSCOPY  2005, 08/21/11   2005 tubulovillous adenoma and adenomas 2012 - 3 adenomas, largest 10 mm, diverticulosis   FRACTURE SURGERY     ? right foot as a child   KNEE ARTHROSCOPY     TONSILLECTOMY AND ADENOIDECTOMY     VEIN LIGATION AND STRIPPING     left    Family History  Problem Relation Age of Onset   Prostate cancer Brother    Diabetes Brother    Colon cancer Neg Hx     Social History   Tobacco Use   Smoking status: Some  Days    Types: Cigars   Smokeless tobacco: Never   Tobacco comments:    once weekly cigars, remotely chewed tobacco  Substance Use Topics   Alcohol use: Yes    Comment: once monthly   Drug use: No    Prior to Admission medications   Medication Sig Start Date End Date Taking? Authorizing Provider  aspirin EC 81 MG tablet Take 81 mg by mouth at bedtime.     [provider]  Blood Glucose Monitoring Suppl (ONE TOUCH ULTRA 2) w/Device KIT check blood sugar 1 time daily-E11.21 11/25/19   Unk Pinto, MD  Cholecalciferol (VITAMIN D-3) 25 MCG (1000 UT) CAPS Take 1,000 Units by mouth at bedtime.    [provider]  Cinnamon 500 MG TABS Take 1,000 mg by mouth daily.     [provider]  doxazosin (CARDURA) 4 MG tablet Take  1 tablet  at Bedtime  for BP & Prostate 04/02/21   Unk Pinto, MD  famotidine (PEPCID) 40 MG tablet Take 1 tablet Daily for Indigestion & Heartburn Patient taking differently: Take 40 mg by mouth daily. 08/08/19   Unk Pinto, MD  furosemide (LASIX) 40 MG tablet Take 1 tablet Daily for BP and Fluid Retention / Ankle Swelling 02/05/21   Unk Pinto, MD  glimepiride (AMARYL) 4 MG tablet Take  1/2 to 1 tablet  2 x /day  with Meals for Diabetes                                         /                 TAKE                          BY                    MOUTH 03/30/22   Unk Pinto, MD  glucose blood (ONETOUCH ULTRA) test strip USE TO CHECK BLOOD SUGAR ONCE DAILY. 03/26/22   Unk Pinto, MD  Lancets [facility] ULTRASOFT) lancets Check blood sugar 1 time daily-E11.21 11/25/19   Unk Pinto, MD  Multiple Vitamins-Minerals (ONE-A-DAY MENS 50+ ADVANTAGE) TABS Take 1 tablet by mouth daily with breakfast.    [provider]  olmesartan (BENICAR) 40 MG tablet Take  1 tablet  Daily  for BP & Diabetic Kidney Protection  (Replaces Lisinopril) 08/20/21   Unk Pinto, MD  Omega-3 Fatty Acids (FISH OIL) 1200 MG CAPS Take 1,200 mg by  mouth daily.    [provider]  pravastatin (PRAVACHOL) 40 MG tablet Take  1 tablet  at Bedtime  for Cholesterol 09/09/21   Unk Pinto, MD  Semaglutide, 1 MG/DOSE, 4 MG/3ML SOPN Inject 1 mg into the skin once a week. 01/24/22   Liane Comber, NP    Allergies Tramadol   REVIEW OF SYSTEMS  Negative except as noted here or in the History of Present Illness.   PHYSICAL EXAMINATION  Initial Vital Signs Blood pressure (!) 135/93, pulse 86, temperature (!) 101.3 F (38.5 C), temperature source Oral, resp. rate 18, height _0  (1.753 m), weight 113.4 kg, SpO2 95 %.  Examination General: Well-developed, well-nourished male in no acute distress; appearance consistent with age of record HENT: normocephalic; atraumatic Eyes: pupils equal, round and reactive to light; extraocular muscles intact Neck: supple Heart: regular rate and rhythm Lungs: Mild coarse sounds in bases Abdomen: soft; nondistended; nontender; bowel sounds present Extremities: No deformity; full range of motion; edema of lower legs and feet Neurologic: Awake, alert and oriented; motor function intact in all extremities and symmetric; no facial droop Skin: Warm and dry Psychiatric: Normal mood and affect   RESULTS  Summary of this visit's results, reviewed and interpreted by myself:   EKG Interpretation  Date/Time:    Ventricular Rate:    PR Interval:    QRS Duration:   QT Interval:    QTC Calculation:   R Axis:     Text Interpretation:         Laboratory Studies: Results for orders placed or performed during the hospital encounter of 04/26/22 (from the past 24 hour(s))  Resp Panel by RT-PCR (Flu A&B, Covid) Anterior Nasal Swab     Status: None   Collection Time: 04/26/22  7:37 PM   Specimen: Anterior Nasal Swab  Result Value Ref Range   SARS Coronavirus 2 by RT PCR NEGATIVE NEGATIVE   Influenza A by PCR NEGATIVE NEGATIVE   Influenza B by PCR NEGATIVE NEGATIVE  CBC with Differential      Status: Abnormal   Collection Time: 04/26/22 10:19 PM  Result Value Ref Range   WBC 7.7 4.0 - 10.5 K/uL  RBC 4.72 4.22 - 5.81 MIL/uL   Hemoglobin 13.9 13.0 - 17.0 g/dL   HCT 41.1 39.0 - 52.0 %   MCV 87.1 80.0 - 100.0 fL   MCH 29.4 26.0 - 34.0 pg   MCHC 33.8 30.0 - 36.0 g/dL   RDW 13.6 11.5 - 15.5 %   Platelets 145 (L) 150 - 400 K/uL   nRBC 0.0 0.0 - 0.2 %   Neutrophils Relative % 58 %   Neutro Abs 4.5 1.7 - 7.7 K/uL   Lymphocytes Relative 18 %   Lymphs Abs 1.4 0.7 - 4.0 K/uL   Monocytes Relative 18 %   Monocytes Absolute 1.4 (H) 0.1 - 1.0 K/uL   Eosinophils Relative 4 %   Eosinophils Absolute 0.3 0.0 - 0.5 K/uL   Basophils Relative 1 %   Basophils Absolute 0.0 0.0 - 0.1 K/uL   Immature Granulocytes 1 %   Abs Immature Granulocytes 0.09 (H) 0.00 - 0.07 K/uL  Comprehensive metabolic panel     Status: Abnormal   Collection Time: 04/26/22 10:19 PM  Result Value Ref Range   Sodium 135 135 - 145 mmol/L   Potassium 4.4 3.5 - 5.1 mmol/L   Chloride 102 98 - 111 mmol/L   CO2 25 22 - 32 mmol/L   Glucose, Bld 231 (H) 70 - 99 mg/dL   BUN 29 (H) 8 - 23 mg/dL   Creatinine, Ser 1.62 (H) 0.61 - 1.24 mg/dL   Calcium 8.9 8.9 - 10.3 mg/dL   Total Protein 6.5 6.5 - 8.1 g/dL   Albumin 4.0 3.5 - 5.0 g/dL   AST 19 15 - 41 U/L   ALT 22 0 - 44 U/L   Alkaline Phosphatase 64 38 - 126 U/L   Total Bilirubin 0.5 0.3 - 1.2 mg/dL   GFR, Estimated 41 (L) >60 mL/min   Anion gap 8 5 - 15  Lactic acid, plasma     Status: None   Collection Time: 04/26/22 11:42 PM  Result Value Ref Range   Lactic Acid, Venous 1.5 0.5 - 1.9 mmol/L   Imaging Studies: DG Chest 2 View  Result Date: 04/26/2022 CLINICAL DATA:  Fever, cough. EXAM: CHEST - 2 VIEW COMPARISON:  05/05/2020. FINDINGS: The heart size and mediastinal contours are within normal limits. There is atherosclerotic calcification of the aorta. Lung volumes are low with mild atelectasis at the lung bases. No consolidation, effusion, or pneumothorax.  Degenerative changes are present in the thoracic spine. IMPRESSION: Mild atelectasis at the lung bases. Electronically Signed   By: Brett Fairy M.D.   On: 04/26/2022 22:01    ED COURSE and MDM  Nursing notes, initial and subsequent vitals signs, including pulse oximetry, reviewed and interpreted by myself.  Vitals:   04/26/22 2245 04/26/22 2300 04/26/22 2353 04/27/22 0036  BP: (!) 158/54 (!) 150/61  (!) 158/92  Pulse: 82 84  71  Resp:    18  Temp:   98.3 F (36.8 C)   TempSrc:   Oral   SpO2: 94% 95%  95%  Weight:      Height:       Medications  doxycycline (VIBRA-TABS) tablet 100 mg (has no administration in time range)  albuterol (VENTOLIN HFA) 108 (90 Base) MCG/ACT inhaler 2 puff (has no administration in time range)  aerochamber plus with mask device 1 each (has no administration in time range)    12:45 AM Initial lactate is normal.  The patient does not meet sepsis criteria.  He wishes to  go home and not wait to provide a urine specimen.  I believe it is reasonable to start him on doxycycline as he clinically has bronchitis and given his age he is at risk for progression to a bacterial superinfection.  We will also provide him with an albuterol inhaler and AeroChamber and instruct him in their use.   PROCEDURES  Procedures   ED DIAGNOSES     ICD-10-CM   1. Acute bronchitis due to infection  J20.9          Keelon Zurn, Jenny Reichmann, MD 04/27/22 [phone]

## 2022-04-27 LAB — LACTIC ACID, PLASMA: Lactic Acid, Venous: 1.5 mmol/L (ref 0.5–1.9)

## 2022-04-27 MED ORDER — DOXYCYCLINE HYCLATE 100 MG PO TABS
100.0000 mg | ORAL_TABLET | Freq: Once | ORAL | Status: AC
  Administered 2022-04-27: 100 mg via ORAL
  Filled 2022-04-27: qty 1

## 2022-04-27 MED ORDER — ALBUTEROL SULFATE HFA 108 (90 BASE) MCG/ACT IN AERS
2.0000 | INHALATION_SPRAY | RESPIRATORY_TRACT | Status: DC | PRN
  Administered 2022-04-27: 2 via RESPIRATORY_TRACT
  Filled 2022-04-27: qty 6.7

## 2022-04-27 MED ORDER — DOXYCYCLINE HYCLATE 100 MG PO CAPS: 100.0000 mg | ORAL_CAPSULE | Freq: Two times a day (BID) | ORAL | 0 refills | Status: DC

## 2022-04-27 MED ORDER — AEROCHAMBER PLUS FLO-VU MISC
1.0000 | Freq: Once | Status: AC
  Administered 2022-04-27: 1
  Filled 2022-04-27: qty 1

## 2022-04-27 NOTE — ED Notes (Signed)
RT educated pt on proper use of MDI w/spacer. Pt able to perform w/out difficulty. Pt verbalizes understanding of teaching and dosing administration for best results.

## 2022-05-02 LAB — CULTURE, BLOOD (ROUTINE X 2)
Culture: NO GROWTH
Culture: NO GROWTH
Special Requests: ADEQUATE

## 2022-05-14 ENCOUNTER — Ambulatory Visit (INDEPENDENT_AMBULATORY_CARE_PROVIDER_SITE_OTHER): Payer: Medicare Other | Admitting: Nurse Practitioner

## 2022-05-14 ENCOUNTER — Encounter: Payer: Self-pay | Admitting: Nurse Practitioner

## 2022-05-14 ENCOUNTER — Ambulatory Visit
Admission: RE | Admit: 2022-05-14 | Discharge: 2022-05-14 | Disposition: A | Discharge status: Home / Self Care | Payer: Medicare Other | Source: Ambulatory Visit | Attending: Nurse Practitioner | Admitting: Nurse Practitioner

## 2022-05-14 VITALS — BP 144/58 | HR 95 | Temp 97.7°F | Ht 69.0 in | Wt 258.8 lb

## 2022-05-14 DIAGNOSIS — J40 Bronchitis, not specified as acute or chronic: Secondary | ICD-10-CM | POA: Diagnosis not present

## 2022-05-14 DIAGNOSIS — Z1152 Encounter for screening for COVID-19: Secondary | ICD-10-CM | POA: Diagnosis not present

## 2022-05-14 DIAGNOSIS — R059 Cough, unspecified: Secondary | ICD-10-CM | POA: Diagnosis not present

## 2022-05-14 DIAGNOSIS — R195 Other fecal abnormalities: Secondary | ICD-10-CM | POA: Diagnosis not present

## 2022-05-14 DIAGNOSIS — R6889 Other general symptoms and signs: Secondary | ICD-10-CM | POA: Diagnosis not present

## 2022-05-14 LAB — POCT INFLUENZA A/B
Influenza A, POC: NEGATIVE
Influenza B, POC: NEGATIVE

## 2022-05-14 LAB — POC COVID19 BINAXNOW: SARS Coronavirus 2 Ag: NEGATIVE

## 2022-05-14 MED ORDER — ALBUTEROL SULFATE HFA 108 (90 BASE) MCG/ACT IN AERS: 2.0000 | INHALATION_SPRAY | Freq: Four times a day (QID) | RESPIRATORY_TRACT | 2 refills | Status: DC | PRN

## 2022-05-14 MED ORDER — IPRATROPIUM-ALBUTEROL 0.5-2.5 (3) MG/3ML IN SOLN
3.0000 mL | Freq: Once | RESPIRATORY_TRACT | Status: AC
  Administered 2022-05-14: 3 mL via RESPIRATORY_TRACT

## 2022-05-14 MED ORDER — PROMETHAZINE-DM 6.25-15 MG/5ML PO SYRP: 5.0000 mL | ORAL_SOLUTION | Freq: Four times a day (QID) | ORAL | 1 refills | Status: DC | PRN

## 2022-05-14 MED ORDER — LEVOFLOXACIN 750 MG PO TABS: 750.0000 mg | ORAL_TABLET | Freq: Every day | ORAL | 0 refills | Status: AC

## 2022-05-14 NOTE — Progress Notes (Signed)
Assessment and Plan:  Edwin Osborne was seen today for acute visit.  Diagnoses and all orders for this visit:  Flu-like symptoms -     POCT Influenza A/B Negative  Encounter for screening for COVID-19 -     POC COVID-19 Negative  Stool guaiac positive Refer to GI for further evaluation of heme + stool with increased marked fatigue If notices increase in blood, severe abdominal pain, nausea, vomiting he is to go to the ER -     Ambulatory referral to Gastroenterology -     CBC with Differential/Platelet -     COMPLETE METABOLIC PANEL WITH GFR  Bronchitis Get chest xray Start Mucinex and Levaquin daily Push fluids Use Albuterol inhaler and promethazine cough syrup q 8 hours as needed If fever becomes greater than 102, shortness of breath worsens he is to go to the ER -     levofloxacin (LEVAQUIN) 750 MG tablet; Take 1 tablet (750 mg total) by mouth daily for 10 days. -     albuterol (VENTOLIN HFA) 108 (90 Base) MCG/ACT inhaler; Inhale 2 puffs into the lungs every 6 (six) hours as needed for wheezing or shortness of breath. -     promethazine-dextromethorphan (PROMETHAZINE-DM) 6.25-15 MG/5ML syrup; Take 5 mLs by mouth 4 (four) times daily as needed for cough. -     ipratropium-albuterol (DUONEB) 0.5-2.5 (3) MG/3ML nebulizer solution 3 mL       Further disposition pending results of labs. Discussed med's effects and SE's.   Over 30 minutes of exam, counseling, chart review, and critical decision making was performed.   Future Appointments  Date Time Provider [place]  07/09/2022  9:00 AM Alycia Rossetti, NP [facility] None  10/08/2022 11:00 AM Darrol Jump, NP [facility] None    ------------------------------------------------------------------------------------------------------------------   HPI BP (!) 144/58   Pulse 95   Temp 97.7 F (36.5 C)   Ht 5' 9" (1.753 m)   Wt 258 lb 12.8 oz (117.4 kg)   SpO2 91%   BMI 38.22 kg/m   87 y.o.male presents for  reevaluation of bronchitis  Patient was seen in the ER on 04/26/22 for productive cough, fever, nasal congestion, sore throat and runny nose.  He had chest xray that showed atelectasis.  Was started on Doxycycline, albuterol inhaler.   Continues to have a productive cough of yellow mucus, fever of 101 last night, marked fatigue. Denies nausea, vomiting and diarrhea, body aches.   He has noticed some bright red blood when wiping after BM.  Believes he does have hemorrhoids.  It occurs irregularly and is not with every bowel movement  He is currently on doxazosin 4 mg daily, olmesartan 40 mg daily. Denies headaches, chest pain, shortness of breath and dizziness.  BP Readings from Last 3 Encounters:  05/14/22 (!) 144/58  04/27/22 (!) 158/92  01/16/22 136/86     Past Medical History:  Diagnosis Date   AKI (acute kidney injury) ([place]) 04/20/2020   Arthritis    BPH (benign prostatic hyperplasia)    Cataract    CKD (chronic kidney disease), stage III ([place])    Depression    "usually a happy go lucky"   Diabetes mellitus    Diverticulosis    GERD (gastroesophageal reflux disease)    sometimes will take omeprazole   Hyperlipidemia    Hypertension    Sepsis secondary to UTI ([place]) 05/04/2020     Allergies  Allergen Reactions   Tramadol Other (See Comments)    "Made his head feel  funny"    Current Outpatient Medications on File Prior to Visit  Medication Sig   Acetaminophen (TYLENOL PO) Take by mouth.   aspirin EC 81 MG tablet Take 81 mg by mouth at bedtime.    Cholecalciferol (VITAMIN D-3) 25 MCG (1000 UT) CAPS Take 1,000 Units by mouth at bedtime.   Cinnamon 500 MG TABS Take 1,000 mg by mouth daily.    doxazosin (CARDURA) 4 MG tablet Take  1 tablet  at Bedtime  for BP & Prostate   furosemide (LASIX) 40 MG tablet Take 1 tablet Daily for BP and Fluid Retention / Ankle Swelling   glimepiride (AMARYL) 4 MG tablet Take  1/2 to 1 tablet  2 x /day  with Meals for Diabetes                                          /                 TAKE                          BY                    MOUTH   Multiple Vitamins-Minerals (ONE-A-DAY MENS 50+ ADVANTAGE) TABS Take 1 tablet by mouth daily with breakfast.   olmesartan (BENICAR) 40 MG tablet Take  1 tablet  Daily  for BP & Diabetic Kidney Protection  (Replaces Lisinopril)   Omega-3 Fatty Acids (FISH OIL) 1200 MG CAPS Take 1,200 mg by mouth daily.   pravastatin (PRAVACHOL) 40 MG tablet Take  1 tablet  at Bedtime  for Cholesterol   Semaglutide, 1 MG/DOSE, 4 MG/3ML SOPN Inject 1 mg into the skin once a week.   Blood Glucose Monitoring Suppl (ONE TOUCH ULTRA 2) w/Device KIT check blood sugar 1 time daily-E11.21   doxycycline (VIBRAMYCIN) 100 MG capsule Take 1 capsule (100 mg total) by mouth 2 (two) times daily. One po bid x 7 days (Patient not taking: Reported on 05/14/2022)   famotidine (PEPCID) 40 MG tablet Take 1 tablet Daily for Indigestion & Heartburn (Patient not taking: Reported on 05/14/2022)   glucose blood (ONETOUCH ULTRA) test strip USE TO CHECK BLOOD SUGAR ONCE DAILY.   Lancets (ONETOUCH ULTRASOFT) lancets Check blood sugar 1 time daily-E11.21   No current facility-administered medications on file prior to visit.    Review of Systems  Constitutional:  Positive for fever and malaise/fatigue.  HENT:  Positive for congestion. Negative for ear pain and sore throat.   Eyes:  Negative for blurred vision.  Respiratory:  Positive for cough, sputum production (yellow), shortness of breath and wheezing.   Cardiovascular:  Positive for leg swelling. Negative for chest pain and palpitations.  Gastrointestinal:  Positive for blood in stool. Negative for abdominal pain, diarrhea, nausea and vomiting.  Genitourinary:  Negative for dysuria.  Musculoskeletal:  Negative for back pain, joint pain and myalgias.  Skin: Negative.   Neurological:  Positive for weakness. Negative for dizziness and headaches.  Endo/Heme/Allergies:  Does not bruise/bleed  easily.  Psychiatric/Behavioral: Negative.        Physical Exam:  BP (!) 144/58   Pulse 95   Temp 97.7 F (36.5 C)   Ht 5' 9" (1.753 m)   Wt 258 lb 12.8 oz (117.4 kg)   SpO2 91%   BMI  38.22 kg/m   General Appearance: Well nourished, appears ill Eyes: PERRLA, EOMs, conjunctiva no swelling or erythema Sinuses: No Frontal/maxillary tenderness ENT/Mouth: Ext aud canals clear, TMs without erythema, bulging. No erythema, swelling, or exudate on post pharynx.  Tonsils not swollen or erythematous. Hearing normal.  Neck: Supple, thyroid normal.  Respiratory: Expiratory wheezes of lower lobes bilaterally, rales throughout lungs bilaterally but worst LLL- persistent cough throughout visit Cardio: RRR with no MRGs. Brisk peripheral pulses without edema.  Abdomen: Soft, + BS.  Non tender, no guarding, rebound, hernias, masses. Lymphatics: Non tender without lymphadenopathy.  Musculoskeletal: Full ROM, 5/5 strength, normal gait.  Skin: Warm, dry without rashes, lesions, ecchymosis.  Neuro: Cranial nerves intact. Normal muscle tone, no cerebellar symptoms. Sensation intact.  Psych: Awake and oriented X 3, normal affect, Insight and Judgment appropriate.  Rectal: no external hemorrhoids noted, possible internal hemorrhoid. No frank blood noted. Heme positive stool  Alycia Rossetti, NP 11:47 AM [facility]

## 2022-05-14 NOTE — Patient Instructions (Addendum)
Want to rule out pneumonia Chest xray is ordered at [facility] in and have xray  Begin Levaquin 1 tablet daily with food Continue Albuterol inhaler 2-3 times a day Promethazine cough syrup every 8 hours as needed- will make you sleepy Use Plain Mucinex daily  Referral is placed for Gastroenterologist for positive hemoccult, they will be calling you for an appointment  Acute Bronchitis, Adult  Acute bronchitis is sudden inflammation of the main airways (bronchi) that come off the windpipe (trachea) in the lungs. The swelling causes the airways to get smaller and make more mucus than normal. This can make it hard to breathe and can cause coughing or noisy breathing (wheezing). Acute bronchitis may last several weeks. The cough may last longer. Allergies, asthma, and exposure to smoke may make the condition worse. What are the causes? This condition can be caused by germs and by substances that irritate the lungs, including: Cold and flu viruses. The most common cause of this condition is the virus that causes the common cold. Bacteria. This is less common. Breathing in substances that irritate the lungs, including: Smoke from cigarettes and other forms of tobacco. Dust and pollen. Fumes from household cleaning products, gases, or burned fuel. Indoor or outdoor air pollution. What increases the risk? The following factors may make you more likely to develop this condition: A weak body's defense system, also called the immune system. A condition that affects your lungs and breathing, such as asthma. What are the signs or symptoms? Common symptoms of this condition include: Coughing. This may bring up clear, yellow, or green mucus from your lungs (sputum). Wheezing. Runny or stuffy nose. Having too much mucus in your lungs (chest congestion). Shortness of breath. Aches and pains, including sore throat or chest. How is this diagnosed? This condition is  usually diagnosed based on: Your symptoms and medical history. A physical exam. You may also have other tests, including tests to rule out other conditions, such as pneumonia. These tests include: A test of lung function. Test of a mucus sample to look for the presence of bacteria. Tests to check the oxygen level in your blood. Blood tests. Chest X-ray. How is this treated? Most cases of acute bronchitis clear up over time without treatment. Your health care provider may recommend: Drinking more fluids to help thin your mucus so it is easier to cough up. Taking inhaled medicine (inhaler) to improve air flow in and out of your lungs. Using a vaporizer or a humidifier. These are machines that add water to the air to help you breathe better. Taking a medicine that thins mucus and clears congestion (expectorant). Taking a medicine that prevents or stops coughing (cough suppressant). It is not common to take an antibiotic medicine for this condition. Follow these instructions at home:  Take over-the-counter and prescription medicines only as told by your health care provider. Use an inhaler, vaporizer, or humidifier as told by your health care provider. Take two teaspoons (10 mL) of honey at bedtime to lessen coughing at night. Drink enough fluid to keep your urine pale yellow. Do not use any products that contain nicotine or tobacco. These products include cigarettes, chewing tobacco, and vaping devices, such as e-cigarettes. If you need help quitting, ask your health care provider. Get plenty of rest. Return to your normal activities as told by your health care provider. Ask your health care provider what activities are safe for you. Keep all follow-up visits. This is important.  How is this prevented? To lower your risk of getting this condition again: Wash your hands often with soap and water for at least 20 seconds. If soap and water are not available, use hand sanitizer. Avoid contact  with people who have cold symptoms. Try not to touch your mouth, nose, or eyes with your hands. Avoid breathing in smoke or chemical fumes. Breathing smoke or chemical fumes will make your condition worse. Get the flu shot every year. Contact a health care provider if: Your symptoms do not improve after 2 weeks. You have trouble coughing up the mucus. Your cough keeps you awake at night. You have a fever. Get help right away if you: Cough up blood. Feel pain in your chest. Have severe shortness of breath. Faint or keep feeling like you are going to faint. Have a severe headache. Have a fever or chills that get worse. These symptoms may represent a serious problem that is an emergency. Do not wait to see if the symptoms will go away. Get medical help right away. Call your local emergency services (911 in the [place] Do not drive yourself to the hospital. Summary Acute bronchitis is inflammation of the main airways (bronchi) that come off the windpipe (trachea) in the lungs. The swelling causes the airways to get smaller and make more mucus than normal. Drinking more fluids can help thin your mucus so it is easier to cough up. Take over-the-counter and prescription medicines only as told by your health care provider. Do not use any products that contain nicotine or tobacco. These products include cigarettes, chewing tobacco, and vaping devices, such as e-cigarettes. If you need help quitting, ask your health care provider. Contact a health care provider if your symptoms do not improve after 2 weeks. This information is not intended to replace advice given to you by your health care provider. Make sure you discuss any questions you have with your health care provider. Document Revised: 12/19/2021 Document Reviewed: 01/09/2021 Elsevier Patient Education  [place].

## 2022-05-15 LAB — COMPLETE METABOLIC PANEL WITH GFR
AG Ratio: 1.6 (calc) (ref 1.0–2.5)
ALT: 17 U/L (ref 9–46)
AST: 18 U/L (ref 10–35)
Albumin: 4 g/dL (ref 3.6–5.1)
Alkaline phosphatase (APISO): 66 U/L (ref 35–144)
BUN/Creatinine Ratio: 19 (calc) (ref 6–22)
BUN: 36 mg/dL — ABNORMAL HIGH (ref 7–25)
CO2: 29 mmol/L (ref 20–32)
Calcium: 9.2 mg/dL (ref 8.6–10.3)
Chloride: 97 mmol/L — ABNORMAL LOW (ref 98–110)
Creat: 1.93 mg/dL — ABNORMAL HIGH (ref 0.70–1.22)
Globulin: 2.5 g/dL (calc) (ref 1.9–3.7)
Glucose, Bld: 200 mg/dL — ABNORMAL HIGH (ref 65–99)
Potassium: 4.7 mmol/L (ref 3.5–5.3)
Sodium: 137 mmol/L (ref 135–146)
Total Bilirubin: 0.6 mg/dL (ref 0.2–1.2)
Total Protein: 6.5 g/dL (ref 6.1–8.1)
eGFR: 34 mL/min/{1.73_m2} — ABNORMAL LOW (ref >=60)

## 2022-05-15 LAB — CBC WITH DIFFERENTIAL/PLATELET
Absolute Monocytes: 1389 cells/uL — ABNORMAL HIGH (ref 200–950)
Basophils Absolute: 45 cells/uL (ref 0–200)
Basophils Relative: 0.3 %
Eosinophils Absolute: 196 cells/uL (ref 15–500)
Eosinophils Relative: 1.3 %
HCT: 40.1 % (ref 38.5–50.0)
Hemoglobin: 13.4 g/dL (ref 13.2–17.1)
Lymphs Abs: 2039 cells/uL (ref 850–3900)
MCH: 29.6 pg (ref 27.0–33.0)
MCHC: 33.4 g/dL (ref 32.0–36.0)
MCV: 88.7 fL (ref 80.0–100.0)
MPV: 10.7 fL (ref 7.5–12.5)
Monocytes Relative: 9.2 %
Neutro Abs: 11431 cells/uL — ABNORMAL HIGH (ref 1500–7800)
Neutrophils Relative %: 75.7 %
Platelets: 193 10*3/uL (ref 140–400)
RBC: 4.52 10*6/uL (ref 4.20–5.80)
RDW: 12.9 % (ref 11.0–15.0)
Total Lymphocyte: 13.5 %
WBC: 15.1 10*3/uL — ABNORMAL HIGH (ref 3.8–10.8)

## 2022-05-16 ENCOUNTER — Encounter: Payer: Self-pay | Admitting: Nurse Practitioner

## 2022-05-16 ENCOUNTER — Other Ambulatory Visit: Payer: Self-pay | Admitting: Nurse Practitioner

## 2022-05-16 DIAGNOSIS — J411 Mucopurulent chronic bronchitis: Secondary | ICD-10-CM

## 2022-05-16 NOTE — Progress Notes (Signed)
Spoke with patient. Patient verbalized understanding.

## 2022-05-16 NOTE — Progress Notes (Signed)
Patient's wife is aware of chest x ray results and ref. -e welch

## 2022-05-19 NOTE — Progress Notes (Unsigned)
Assessment and Plan:  There are no diagnoses linked to this encounter.    Further disposition pending results of labs. Discussed med's effects and SE's.   Over 30 minutes of exam, counseling, chart review, and critical decision making was performed.   Future Appointments  Date Time Provider [place]  05/20/2022  9:45 AM Alycia Rossetti, NP [facility] None  05/22/2022  2:30 PM Freddi Starr, MD [facility] None  06/18/2022 11:30 AM Noralyn Pick, NP [facility]  07/09/2022  9:00 AM Alycia Rossetti, NP [facility] None  10/08/2022 11:00 AM Darrol Jump, NP [facility] None    ------------------------------------------------------------------------------------------------------------------   HPI There were no vitals taken for this visit. 87 y.o.male presents for  Past Medical History:  Diagnosis Date   AKI (acute kidney injury) ([place]) 04/20/2020   Arthritis    BPH (benign prostatic hyperplasia)    Cataract    CKD (chronic kidney disease), stage III ([place])    Depression    "usually a happy go lucky"   Diabetes mellitus    Diverticulosis    GERD (gastroesophageal reflux disease)    sometimes will take omeprazole   Hyperlipidemia    Hypertension    Sepsis secondary to UTI ([place]) 05/04/2020     Allergies  Allergen Reactions   Tramadol Other (See Comments)    "Made his head feel funny"    Current Outpatient Medications on File Prior to Visit  Medication Sig   Acetaminophen (TYLENOL PO) Take by mouth.   albuterol (VENTOLIN HFA) 108 (90 Base) MCG/ACT inhaler Inhale 2 puffs into the lungs every 6 (six) hours as needed for wheezing or shortness of breath.   aspirin EC 81 MG tablet Take 81 mg by mouth at bedtime.    Blood Glucose Monitoring Suppl (ONE TOUCH ULTRA 2) w/Device KIT check blood sugar 1 time daily-E11.21   Cholecalciferol (VITAMIN D-3) 25 MCG (1000 UT) CAPS Take 1,000 Units by mouth at bedtime.   Cinnamon 500 MG TABS Take 1,000 mg by  mouth daily.    doxazosin (CARDURA) 4 MG tablet Take  1 tablet  at Bedtime  for BP & Prostate   famotidine (PEPCID) 40 MG tablet Take 1 tablet Daily for Indigestion & Heartburn (Patient not taking: Reported on 05/14/2022)   furosemide (LASIX) 40 MG tablet Take 1 tablet Daily for BP and Fluid Retention / Ankle Swelling   glimepiride (AMARYL) 4 MG tablet Take  1/2 to 1 tablet  2 x /day  with Meals for Diabetes                                         /                 TAKE                          BY                    MOUTH   glucose blood (ONETOUCH ULTRA) test strip USE TO CHECK BLOOD SUGAR ONCE DAILY.   Lancets (ONETOUCH ULTRASOFT) lancets Check blood sugar 1 time daily-E11.21   levofloxacin (LEVAQUIN) 750 MG tablet Take 1 tablet (750 mg total) by mouth daily for 10 days.   Multiple Vitamins-Minerals (ONE-A-DAY MENS 50+ ADVANTAGE) TABS Take 1 tablet by mouth daily with breakfast.   olmesartan (BENICAR)  40 MG tablet Take  1 tablet  Daily  for BP & Diabetic Kidney Protection  (Replaces Lisinopril)   Omega-3 Fatty Acids (FISH OIL) 1200 MG CAPS Take 1,200 mg by mouth daily.   pravastatin (PRAVACHOL) 40 MG tablet Take  1 tablet  at Bedtime  for Cholesterol   promethazine-dextromethorphan (PROMETHAZINE-DM) 6.25-15 MG/5ML syrup Take 5 mLs by mouth 4 (four) times daily as needed for cough.   Semaglutide, 1 MG/DOSE, 4 MG/3ML SOPN Inject 1 mg into the skin once a week.   No current facility-administered medications on file prior to visit.    ROS: all negative except above.   Physical Exam:  There were no vitals taken for this visit.  General Appearance: Well nourished, in no apparent distress. Eyes: PERRLA, EOMs, conjunctiva no swelling or erythema Sinuses: No Frontal/maxillary tenderness ENT/Mouth: Ext aud canals clear, TMs without erythema, bulging. No erythema, swelling, or exudate on post pharynx.  Tonsils not swollen or erythematous. Hearing normal.  Neck: Supple, thyroid normal.  Respiratory:  Respiratory effort normal, BS equal bilaterally without rales, rhonchi, wheezing or stridor.  Cardio: RRR with no MRGs. Brisk peripheral pulses without edema.  Abdomen: Soft, + BS.  Non tender, no guarding, rebound, hernias, masses. Lymphatics: Non tender without lymphadenopathy.  Musculoskeletal: Full ROM, 5/5 strength, normal gait.  Skin: Warm, dry without rashes, lesions, ecchymosis.  Neuro: Cranial nerves intact. Normal muscle tone, no cerebellar symptoms. Sensation intact.  Psych: Awake and oriented X 3, normal affect, Insight and Judgment appropriate.     Alycia Rossetti, NP 10:33 AM [facility]

## 2022-05-20 ENCOUNTER — Encounter: Payer: Self-pay | Admitting: Nurse Practitioner

## 2022-05-20 ENCOUNTER — Ambulatory Visit (INDEPENDENT_AMBULATORY_CARE_PROVIDER_SITE_OTHER): Payer: Medicare Other | Admitting: Nurse Practitioner

## 2022-05-20 VITALS — BP 128/62 | HR 95 | Temp 97.7°F | Ht 69.0 in | Wt 256.8 lb

## 2022-05-20 DIAGNOSIS — J411 Mucopurulent chronic bronchitis: Secondary | ICD-10-CM

## 2022-05-20 DIAGNOSIS — R0989 Other specified symptoms and signs involving the circulatory and respiratory systems: Secondary | ICD-10-CM

## 2022-05-22 ENCOUNTER — Ambulatory Visit (INDEPENDENT_AMBULATORY_CARE_PROVIDER_SITE_OTHER): Payer: Medicare Other | Admitting: Pulmonary Disease

## 2022-05-22 ENCOUNTER — Encounter: Payer: Self-pay | Admitting: Pulmonary Disease

## 2022-05-22 VITALS — BP 132/62 | HR 97 | Temp 98.1°F | Ht 69.0 in | Wt 255.0 lb

## 2022-05-22 DIAGNOSIS — J40 Bronchitis, not specified as acute or chronic: Secondary | ICD-10-CM

## 2022-05-22 DIAGNOSIS — F17290 Nicotine dependence, other tobacco product, uncomplicated: Secondary | ICD-10-CM | POA: Diagnosis not present

## 2022-05-22 MED ORDER — FLUTICASONE-SALMETEROL 250-50 MCG/ACT IN AEPB: 1.0000 | INHALATION_SPRAY | Freq: Two times a day (BID) | RESPIRATORY_TRACT | 6 refills | Status: DC

## 2022-05-22 NOTE — Progress Notes (Signed)
Synopsis: Referred in August 2023 for bronchitis by Lorenda Peck, NP  Subjective:   PATIENT ID: Edwin Osborne GENDER: male DOB: [birth year], MRN: [id number]  HPI  Chief Complaint  Patient presents with   Consult    Consult re bronchitis. Completed Abx today. SOB with ADLs, short walks, etc. Coughing for over a month but improving   Edwin Osborne is an 87 year old male, occasional cigar smoker with hypertension, GERD and hyperlipidemia who is referred to pulmonary clinic for bronchitis.   He was recently treated with levaquin and albuterol inhaler for productive cough, fever, nasal congestion, sore throat and runny nose. Chest x-ray 8/5 and 8/23 showed mild basilar atelectasis. He reported his symptoms improved. His breathing feels the best it's been since last year.  He gets good benefit from albuterol, 3-4 times per day. He reports history of pneumonia a couple times in the past. When he has a cold now his symptoms seem to linger.   He has progressive dyspnea over recent years due to inactivity. He is unable to be active due to bilateral knee pain.   He has been smoking cigars since 2000. He is a retired Research scientist (physical sciences). He has significant asbestos exposure. He reports significant second hand smoke in childhood.  Past Medical History:  Diagnosis Date   AKI (acute kidney injury) ([place]) 04/20/2020   Arthritis    BPH (benign prostatic hyperplasia)    Cataract    CKD (chronic kidney disease), stage III ([place])    Depression    "usually a happy go lucky"   Diabetes mellitus    Diverticulosis    GERD (gastroesophageal reflux disease)    sometimes will take omeprazole   Hyperlipidemia    Hypertension    Sepsis secondary to UTI ([place]) 05/04/2020     Family History  Problem Relation Age of Onset   Prostate cancer Brother    Diabetes Brother    Colon cancer Neg Hx      Social History   Socioeconomic History   Marital status: Married    Spouse name:  Not on file   Number of children: 3   Years of education: Not on file   Highest education level: Not on file  Occupational History   Occupation: retired  Tobacco Use   Smoking status: Some Days    Types: Cigars   Smokeless tobacco: Never   Tobacco comments:    once weekly cigars, remotely chewed tobacco  Substance and Sexual Activity   Alcohol use: Yes    Comment: once monthly   Drug use: No   Sexual activity: Not on file  Other Topics Concern   Not on file  Social History Narrative   Not on file   Social Determinants of Health   Financial Resource Strain: Not on file  Food Insecurity: Not on file  Transportation Needs: Not on file  Physical Activity: Not on file  Stress: Not on file  Social Connections: Not on file  Intimate Partner Violence: Not on file     Allergies  Allergen Reactions   Tramadol Other (See Comments)    "Made his head feel funny"     Outpatient Medications Prior to Visit  Medication Sig Dispense Refill   albuterol (VENTOLIN HFA) 108 (90 Base) MCG/ACT inhaler Inhale 2 puffs into the lungs every 6 (six) hours as needed for wheezing or shortness of breath. 8 g 2   aspirin EC 81 MG tablet Take 81 mg by mouth at  bedtime.      Blood Glucose Monitoring Suppl (ONE TOUCH ULTRA 2) w/Device KIT check blood sugar 1 time daily-E11.21 1 kit 0   Cholecalciferol (VITAMIN D-3) 25 MCG (1000 UT) CAPS Take 1,000 Units by mouth at bedtime.     Cinnamon 500 MG TABS Take 1,000 mg by mouth daily.      doxazosin (CARDURA) 4 MG tablet Take  1 tablet  at Bedtime  for BP & Prostate 90 tablet 3   famotidine (PEPCID) 40 MG tablet Take 1 tablet Daily for Indigestion & Heartburn 90 tablet 3   furosemide (LASIX) 40 MG tablet Take 1 tablet Daily for BP and Fluid Retention / Ankle Swelling 90 tablet 1   glimepiride (AMARYL) 4 MG tablet Take  1/2 to 1 tablet  2 x /day  with Meals for Diabetes                                         /                 TAKE                          BY                     MOUTH 180 tablet 3   glucose blood (ONETOUCH ULTRA) test strip USE TO CHECK BLOOD SUGAR ONCE DAILY. 100 strip 3   guaiFENesin (MUCINEX PO) Take by mouth.     Lancets (ONETOUCH ULTRASOFT) lancets Check blood sugar 1 time daily-E11.21 100 each 3   levofloxacin (LEVAQUIN) 750 MG tablet Take 1 tablet (750 mg total) by mouth daily for 10 days. 10 tablet 0   Multiple Vitamins-Minerals (ONE-A-DAY MENS 50+ ADVANTAGE) TABS Take 1 tablet by mouth daily with breakfast.     olmesartan (BENICAR) 40 MG tablet Take  1 tablet  Daily  for BP & Diabetic Kidney Protection  (Replaces Lisinopril) 90 tablet 3   Omega-3 Fatty Acids (FISH OIL) 1200 MG CAPS Take 1,200 mg by mouth daily.     pravastatin (PRAVACHOL) 40 MG tablet Take  1 tablet  at Bedtime  for Cholesterol 90 tablet 3   promethazine-dextromethorphan (PROMETHAZINE-DM) 6.25-15 MG/5ML syrup Take 5 mLs by mouth 4 (four) times daily as needed for cough. 240 mL 1   Semaglutide, 1 MG/DOSE, 4 MG/3ML SOPN Inject 1 mg into the skin once a week. 9 mL 3   Acetaminophen (TYLENOL PO) Take by mouth.     No facility-administered medications prior to visit.   Review of Systems  Constitutional:  Negative for chills, fever, malaise/fatigue and weight loss.  HENT:  Positive for congestion. Negative for sinus pain and sore throat.   Eyes: Negative.   Respiratory:  Positive for shortness of breath and wheezing. Negative for cough, hemoptysis and sputum production.   Cardiovascular:  Negative for chest pain, palpitations, orthopnea, claudication and leg swelling.  Gastrointestinal:  Negative for abdominal pain, heartburn, nausea and vomiting.  Genitourinary: Negative.   Musculoskeletal:  Positive for joint pain. Negative for myalgias.  Skin:  Negative for rash.  Neurological:  Negative for weakness.  Endo/Heme/Allergies: Negative.   Psychiatric/Behavioral: Negative.     Objective:   Vitals:   05/22/22 1422  BP: 132/62  Pulse: 97  Temp: 98.1 F  (36.7 C)  TempSrc: Oral  SpO2: 94%  Weight: 255 lb (115.7 kg)  Height: _0  (1.753 m)    Physical Exam Constitutional:      General: He is not in acute distress. HENT:     Head: Normocephalic and atraumatic.  Eyes:     Extraocular Movements: Extraocular movements intact.     Conjunctiva/sclera: Conjunctivae normal.     Pupils: Pupils are equal, round, and reactive to light.  Cardiovascular:     Rate and Rhythm: Normal rate and regular rhythm.     Pulses: Normal pulses.     Heart sounds: Normal heart sounds. No murmur heard. Pulmonary:     Effort: Pulmonary effort is normal.     Breath sounds: Normal breath sounds.  Abdominal:     General: Bowel sounds are normal.     Palpations: Abdomen is soft.  Musculoskeletal:     Right lower leg: No edema.     Left lower leg: No edema.  Lymphadenopathy:     Cervical: No cervical adenopathy.  Skin:    General: Skin is warm and dry.  Neurological:     General: No focal deficit present.     Mental Status: He is alert.  Psychiatric:        Mood and Affect: Mood normal.        Behavior: Behavior normal.        Thought Content: Thought content normal.        Judgment: Judgment normal.    CBC    Component Value Date/Time   WBC 15.1 (H) 05/14/2022 1221   RBC 4.52 05/14/2022 1221   HGB 13.4 05/14/2022 1221   HCT 40.1 05/14/2022 1221   PLT 193 05/14/2022 1221   MCV 88.7 05/14/2022 1221   MCH 29.6 05/14/2022 1221   MCHC 33.4 05/14/2022 1221   RDW 12.9 05/14/2022 1221   LYMPHSABS 2,039 05/14/2022 1221   MONOABS 1.4 (H) 04/26/2022 2219   EOSABS 196 05/14/2022 1221   BASOSABS 45 05/14/2022 1221      Latest Ref Rng & Units 05/14/2022   12:21 PM 04/26/2022   10:19 PM 01/16/2022   10:43 AM  BMP  Glucose 65 - 99 mg/dL 200  231  174   BUN 7 - 25 mg/dL 36  29  25   Creatinine 0.70 - 1.22 mg/dL 1.93  1.62  1.69   BUN/Creat Ratio 6 - 22 (calc) 19   15   Sodium 135 - 146 mmol/L 137  135  141   Potassium 3.5 - 5.3 mmol/L 4.7  4.4  5.1    Chloride 98 - 110 mmol/L 97  102  103   CO2 20 - 32 mmol/L _1 Calcium 8.6 - 10.3 mg/dL 9.2  8.9  9.4    Chest imaging: CXR 05/14/22 Lung volumes are small. Mild left basilar atelectasis. Lungs are otherwise clear. No pneumothorax or pleural effusion. Cardiac size within normal limits. Pulmonary vascularity is normal. No acute bone abnormality.  PFT:     No data to display          Labs:  Path:  Echo:  Heart Catheterization:  Assessment & Plan:   Bronchitis - Plan: fluticasone-salmeterol (ADVAIR DISKUS) 250-50 MCG/ACT AEPB, Pulmonary Function Test  Cigar smoker  Discussion: Edwin Osborne is an 87 year old male, occasional cigar smoker with hypertension, GERD and hyperlipidemia who is referred to pulmonary clinic for bronchitis.   He has significant risk factors for obstructive lung disease.   He is to start  advair diskus 250-84mg 1 puff twice daily and continue albuterol inhaler as needed.  Follow up in 2 months with pulmonary function tests.  JFreda Jackson MD [facility] Office: [phone]   Current Outpatient Medications:    albuterol (VENTOLIN HFA) 108 (90 Base) MCG/ACT inhaler, Inhale 2 puffs into the lungs every 6 (six) hours as needed for wheezing or shortness of breath., Disp: 8 g, Rfl: 2   aspirin EC 81 MG tablet, Take 81 mg by mouth at bedtime. , Disp: , Rfl:    Blood Glucose Monitoring Suppl (ONE TOUCH ULTRA 2) w/Device KIT, check blood sugar 1 time daily-E11.21, Disp: 1 kit, Rfl: 0   Cholecalciferol (VITAMIN D-3) 25 MCG (1000 UT) CAPS, Take 1,000 Units by mouth at bedtime., Disp: , Rfl:    Cinnamon 500 MG TABS, Take 1,000 mg by mouth daily. , Disp: , Rfl:    doxazosin (CARDURA) 4 MG tablet, Take  1 tablet  at Bedtime  for BP & Prostate, Disp: 90 tablet, Rfl: 3   famotidine (PEPCID) 40 MG tablet, Take 1 tablet Daily for Indigestion & Heartburn, Disp: 90 tablet, Rfl: 3   fluticasone-salmeterol (ADVAIR DISKUS)  250-50 MCG/ACT AEPB, Inhale 1 puff into the lungs in the morning and at bedtime., Disp: 60 each, Rfl: 6   furosemide (LASIX) 40 MG tablet, Take 1 tablet Daily for BP and Fluid Retention / Ankle Swelling, Disp: 90 tablet, Rfl: 1   glimepiride (AMARYL) 4 MG tablet, Take  1/2 to 1 tablet  2 x /day  with Meals for Diabetes                                         /                 TAKE                          BY                    MOUTH, Disp: 180 tablet, Rfl: 3   glucose blood (ONETOUCH ULTRA) test strip, USE TO CHECK BLOOD SUGAR ONCE DAILY., Disp: 100 strip, Rfl: 3   guaiFENesin (MUCINEX PO), Take by mouth., Disp: , Rfl:    Lancets (ONETOUCH ULTRASOFT) lancets, Check blood sugar 1 time daily-E11.21, Disp: 100 each, Rfl: 3   levofloxacin (LEVAQUIN) 750 MG tablet, Take 1 tablet (750 mg total) by mouth daily for 10 days., Disp: 10 tablet, Rfl: 0   Multiple Vitamins-Minerals (ONE-A-DAY MENS 50+ ADVANTAGE) TABS, Take 1 tablet by mouth daily with breakfast., Disp: , Rfl:    olmesartan (BENICAR) 40 MG tablet, Take  1 tablet  Daily  for BP & Diabetic Kidney Protection  (Replaces Lisinopril), Disp: 90 tablet, Rfl: 3   Omega-3 Fatty Acids (FISH OIL) 1200 MG CAPS, Take 1,200 mg by mouth daily., Disp: , Rfl:    pravastatin (PRAVACHOL) 40 MG tablet, Take  1 tablet  at Bedtime  for Cholesterol, Disp: 90 tablet, Rfl: 3   promethazine-dextromethorphan (PROMETHAZINE-DM) 6.25-15 MG/5ML syrup, Take 5 mLs by mouth 4 (four) times daily as needed for cough., Disp: 240 mL, Rfl: 1   Semaglutide, 1 MG/DOSE, 4 MG/3ML SOPN, Inject 1 mg into the skin once a week., Disp: 9 mL, Rfl: 3

## 2022-05-22 NOTE — Patient Instructions (Signed)
You have multiple risk factors for developing COPD  We will start you on avair diskus 1 puff twice daily - rinse your mouth out after each use  Continue to use albuterol inhaler 1-2 puffs every 4-6 hours as needed  Follow up in 2 months with pulmonary function tests

## 2022-05-28 ENCOUNTER — Encounter: Payer: Self-pay | Admitting: Nurse Practitioner

## 2022-05-31 ENCOUNTER — Other Ambulatory Visit: Payer: Self-pay | Admitting: Internal Medicine

## 2022-05-31 DIAGNOSIS — I10 Essential (primary) hypertension: Secondary | ICD-10-CM

## 2022-05-31 DIAGNOSIS — R609 Edema, unspecified: Secondary | ICD-10-CM

## 2022-06-18 ENCOUNTER — Ambulatory Visit: Payer: Medicare Other | Admitting: Nurse Practitioner

## 2022-07-08 NOTE — Progress Notes (Unsigned)
COMPLETE PHYSICAL  Assessment and Plan:   Ladarrious was seen today for annual exam.  Diagnoses and all orders for this visit:  Encounter for annual physical exam Yearly  Essential hypertension Continue current medications: Olmesartan 52m, Lasix 40 mg qd and cardura 479mMonitor blood pressure at home; call if consistently over 130/80 Continue DASH diet.   Reminder to go to the ER if any CP, SOB, nausea, dizziness, severe HA, changes vision/speech, left arm numbness and tingling and jaw pain. -     CBC with Differential/Platelet -     COMPLETE METABOLIC PANEL WITH GFR -     Magnesium  Hyperlipidemia associated with type 2 diabetes mellitus (HCC) Continue medications:Pravastatin 4050maily Discussed dietary and exercise modifications Low fat diet LDL goal <70 -     Lipid panel  Thoracic aortic atherosclerosis (HCC) Control blood pressure, lipids and glucose Disscused lifestyle modifications, diet & exercise Continue to monitor  Type 2 diabetes mellitus with stage 3b chronic kidney disease, without long-term current use of insulin (HCC) Continue glimepiride - 4 mg BID, Ozempic 1 mg SQ QW  Off of metformin due to advancing CKD Long discussion about dietary choices and physical activity Perform daily foot/skin check, notify office of any concerning changes.  Check A1C Q3m 31m   Hemoglobin A1c  Edema Strongly encouraged to take Furosemide daily Weight self daily and if he gains more than 5 pounds notify the office Encouraged to wear compression socks  Diabetic sensory polyneuropathy (HCC)[place] today. Sensation in tact  Stage 3 chronic kidney disease due to type 2 diabetes mellitus (HCC) Increase fluids  Avoid NSAIDS Blood pressure control Monitor sugars  Will continue to monitor  BPH with obstruction/lower urinary tract symptoms Doing well at this time Continue medications: Doxazosin 4mg 58ml continue to monitor PSA   Vitamin D  deficiency Continue supplementation to maintain goal of 70-100 Taking Vitamin D 1,000 IU daily Vitamin D level  Class 2 severe obesity due to excess calories with serious comorbidity and body mass index (BMI) of 37.0 to 37.9 in adult (HCC)[facility] dietary and exercise modifications Patient will work on increasing vegetables and increase length and frequency of exercise Decrease saturated fats and simple carbs Will follow up in 3 months  Medication management Magnesium  Screening for Hematuria/proteinuria  Microalbumin/creatinine urine ratio  Urine routine with reflex microscopic  Screening for ischemic heart disease  EKG  Need for influenza vaccination -     Flu vaccine HIGH DOSE PF Discussed vaccination and side effcts     Continue diet and meds as discussed. Further disposition pending results of labs. Discussed med's effects and SE's.   Over 30 minutes of exam, counseling, chart review, and critical decision making was performed.   Future Appointments  Date Time Provider [place]27/2023 10:00 AM [facility] PFT ROOM [facility] None  07/18/2022 11:00 AM GroceMagdalen SpatzLBPU-PULCARE None  10/08/2022 11:00 AM CranfDarrol JumpGAAM-[facility] None  07/10/2023  9:00 AM WilkiAlycia RossettiGAAM-GAAIM None    -------------------------------------------------------------------------------------------------------------------------  HPI 87 y.74 male  presents for 6 week follow up on HTN, poorly controlled DMII with CKD and neuropathy, morbid obesity.    Pt continues to have knee pain bilaterally. He is not on current medication for pain.    BMI is Body mass index is 38.75 kg/m., he has not been working on diet, no exercise due to knee pain. Wt Readings from Last 3 Encounters:  07/09/22 262 lb  6.4 oz (119 kg)  05/22/22 255 lb (115.7 kg)  05/20/22 256 lb 12.8 oz (116.5 kg)   He does not check his BP at home, today their BP is BP: 128/78 He has not been  taking his Furosemide daily and is retaining fluid in his lower legs BP Readings from Last 3 Encounters:  07/09/22 128/78  05/22/22 132/62  05/20/22 128/62     He does workout. He denies chest pain, shortness of breath, dizziness.    He is on cholesterol medication (pravastatin 40 mg daily) and denies myalgias. His cholesterol is not at goal. The cholesterol last visit was:   Lab Results  Component Value Date   CHOL 150 01/16/2022   HDL 43 01/16/2022   LDLCALC 77 01/16/2022   TRIG 206 (H) 01/16/2022   CHOLHDL 3.5 01/16/2022    He has not been working on diet and exercise for DMII and has CKD stage 3 P neuropathy, Bilateral lower extremities Hyperlipidemia on pravastatin On ASA, ACEi Off metformin due to GFR <45 Glimeprid 66m BID Semaglutide 1 mg SQ QW Meter: Freestyle Checks daily- running in low 200's  Denies hyperglycemia, hypoglycemia , increased appetite, nausea, polydipsia and polyuria.  Last A1C in the office wasl:  Lab Results  Component Value Date   HGBA1C 9.0 (H) 01/16/2022    He has CKD III associated with T2DM monitored via this office: He has been drinking 8 cups of decaf coffee a day.  He is also drinking water Lab Results  Component Value Date   GFRNONAA 41 (L) 04/26/2022   Patient is on Vitamin D supplement and at goal:  Lab Results  Component Value Date   VD25OH 60 01/16/2022        Current Medications:  Current Outpatient Medications on File Prior to Visit  Medication Sig   albuterol (VENTOLIN HFA) 108 (90 Base) MCG/ACT inhaler Inhale 2 puffs into the lungs every 6 (six) hours as needed for wheezing or shortness of breath.   aspirin EC 81 MG tablet Take 81 mg by mouth at bedtime.    Blood Glucose Monitoring Suppl (ONE TOUCH ULTRA 2) w/Device KIT check blood sugar 1 time daily-E11.21   Cholecalciferol (VITAMIN D-3) 25 MCG (1000 UT) CAPS Take 1,000 Units by mouth at bedtime.   Cinnamon 500 MG TABS Take 1,000 mg by mouth daily.    doxazosin  (CARDURA) 4 MG tablet Take  1 tablet  at Bedtime  for BP & Prostate   famotidine (PEPCID) 40 MG tablet Take 1 tablet Daily for Indigestion & Heartburn   fluticasone-salmeterol (ADVAIR DISKUS) 250-50 MCG/ACT AEPB Inhale 1 puff into the lungs in the morning and at bedtime.   furosemide (LASIX) 40 MG tablet Take  1 tablet  Daily for BP & Fluid Retention /Ankle Swelling                                    /                         TAKE                               BY  MOUTH   glimepiride (AMARYL) 4 MG tablet Take  1/2 to 1 tablet  2 x /day  with Meals for Diabetes                                         /                 TAKE                          BY                    MOUTH   glucose blood (ONETOUCH ULTRA) test strip USE TO CHECK BLOOD SUGAR ONCE DAILY.   guaiFENesin (MUCINEX PO) Take by mouth.   Lancets (ONETOUCH ULTRASOFT) lancets Check blood sugar 1 time daily-E11.21   Multiple Vitamins-Minerals (ONE-A-DAY MENS 50+ ADVANTAGE) TABS Take 1 tablet by mouth daily with breakfast.   olmesartan (BENICAR) 40 MG tablet Take  1 tablet  Daily  for BP & Diabetic Kidney Protection  (Replaces Lisinopril)   Omega-3 Fatty Acids (FISH OIL) 1200 MG CAPS Take 1,200 mg by mouth daily.   promethazine-dextromethorphan (PROMETHAZINE-DM) 6.25-15 MG/5ML syrup Take 5 mLs by mouth 4 (four) times daily as needed for cough.   Semaglutide, 1 MG/DOSE, 4 MG/3ML SOPN Inject 1 mg into the skin once a week.   pravastatin (PRAVACHOL) 40 MG tablet Take  1 tablet  at Bedtime  for Cholesterol (Patient not taking: Reported on 07/09/2022)   No current facility-administered medications on file prior to visit.    Medical History:  Past Medical History:  Diagnosis Date   AKI (acute kidney injury) ([place]) 04/20/2020   Arthritis    BPH (benign prostatic hyperplasia)    Cataract    CKD (chronic kidney disease), stage III ([place])    Depression    "usually a happy go lucky"   Diabetes mellitus     Diverticulosis    GERD (gastroesophageal reflux disease)    sometimes will take omeprazole   Hyperlipidemia    Hypertension    Sepsis secondary to UTI ([place]) 05/04/2020   Allergies Allergies  Allergen Reactions   Tramadol Other (See Comments)    "Made his head feel funny"    SURGICAL HISTORY He  has a past surgical history that includes Tonsillectomy and adenoidectomy; Vein ligation and stripping; Knee arthroscopy; Colonoscopy (2005, 08/21/11); Fracture surgery; Cholecystectomy (N/A, 11/29/2015); and Cataract extraction, bilateral (Bilateral, 2020). FAMILY HISTORY His family history includes Diabetes in his brother; Prostate cancer in his brother. SOCIAL HISTORY He  reports that he has been smoking cigars. He has never used smokeless tobacco. He reports current alcohol use. He reports that he does not use drugs.  Immunization History  Administered Date(s) Administered   DT (Pediatric) 08/31/2015   Influenza Whole 05/24/2011, 08/06/2012, 07/07/2013   Influenza, High Dose Seasonal PF 06/21/2014, 08/31/2015, 06/03/2016, 07/15/2017, 07/29/2018, 07/04/2019, 07/02/2020, 07/09/2021   PFIZER(Purple Top)SARS-COV-2 Vaccination 10/15/2019, 11/06/2019, 08/30/2020   PNEUMOCOCCAL CONJUGATE-20 07/09/2021   Pneumococcal Conjugate-13 06/21/2014   Pneumococcal Polysaccharide-23 03/10/2005   Preventative care: Last colonoscopy: 2012 due 2015 but will not have another, declines, also won't do hemoccult.  Discussed with patient.  Prior vaccinations: TD or Tdap: 2016 Influenza: 06/2019 Pneumococcal: 07/09/2021 Prevnar13: 2015 Shingles/Zostavax: declines  Eye exam:2022 Dentist: 2022 has full upper partials.   Patient Care Team: Unk Pinto, MD as  PCP - General (Internal Medicine) Marlou Sa, Tonna Corner, MD as Consulting Physician (Orthopedic Surgery) Newton Pigg, [facility] as Pharmacist (Pharmacist)    Review of Systems  Constitutional:  Negative for chills and fever.  HENT:  Negative for  congestion, hearing loss, sinus pain, sore throat and tinnitus.   Eyes:  Negative for blurred vision and double vision.  Respiratory:  Positive for shortness of breath (on exertion). Negative for cough, hemoptysis, sputum production and wheezing.   Cardiovascular:  Negative for chest pain, palpitations and leg swelling.  Gastrointestinal:  Negative for abdominal pain, constipation, diarrhea, heartburn, nausea and vomiting.  Genitourinary:  Negative for dysuria and urgency.       Gets up 3 times at night  Musculoskeletal:  Positive for joint pain (knees). Negative for back pain, falls, myalgias and neck pain.  Skin:  Negative for rash.  Neurological:  Negative for dizziness, tingling, tremors, weakness and headaches.  Endo/Heme/Allergies:  Bruises/bleeds easily.  Psychiatric/Behavioral:  Negative for depression and suicidal ideas. The patient is not nervous/anxious and does not have insomnia.       Physical Exam: BP 128/78   Pulse 90   Temp 97.9 F (36.6 C)   Resp 16   Ht _0  (1.753 m)   Wt 262 lb 6.4 oz (119 kg)   SpO2 98%   BMI 38.75 kg/m  Wt Readings from Last 3 Encounters:  07/09/22 262 lb 6.4 oz (119 kg)  05/22/22 255 lb (115.7 kg)  05/20/22 256 lb 12.8 oz (116.5 kg)   General Appearance: Well nourished, obese, in no apparent distress. Eyes: PERRLA, EOMs, conjunctiva no swelling or erythema Sinuses: No Frontal/maxillary tenderness ENT/Mouth: Ext aud canals clear, L TMs without erythema, bulging, R TM with extensive scarring, erythematous around bony structures (per the patient he has chronic drainage issue that has been cleared by ENT). No erythema, swelling, or exudate on post pharynx.  Tonsils not swollen or erythematous. Hearing normal.  Neck: Supple, thyroid normal.  Respiratory: Respiratory effort normal, BS equal bilaterally without rales, rhonchi, wheezing or stridor.  Cardio: RRR with no MRGs. 2+ pitting edema of feet bilaterally, pulses diminished due to  edema Abdomen: Soft, rounded obese abdomen + BS.  Non tender, no guarding, rebound, palpable hernias or masses. Lymphatics: Non tender without lymphadenopathy.  Musculoskeletal: BIl knees with bony enlargement, crepitus, no effusion, 5/5 strength, antalgic gait Skin: Warm, dry without rashes, lesions, ecchymosis. Flaking skin of feet bilaterally Neuro: Cranial nerves intact. No cerebellar symptoms. Sensation in heels diminished bilaterally to monofilament testing.  Psych: Awake and oriented X 3, normal affect, Insight and Judgment appropriate.    EKG: NSR, AV Block I   Magda Bernheim ANP-C  [facility]  07/09/2022

## 2022-07-09 ENCOUNTER — Encounter: Payer: Self-pay | Admitting: Nurse Practitioner

## 2022-07-09 ENCOUNTER — Ambulatory Visit (INDEPENDENT_AMBULATORY_CARE_PROVIDER_SITE_OTHER): Payer: Medicare Other | Admitting: Nurse Practitioner

## 2022-07-09 VITALS — BP 128/78 | HR 90 | Temp 97.9°F | Resp 16 | Ht 69.0 in | Wt 262.4 lb

## 2022-07-09 DIAGNOSIS — I10 Essential (primary) hypertension: Secondary | ICD-10-CM | POA: Diagnosis not present

## 2022-07-09 DIAGNOSIS — Z23 Encounter for immunization: Secondary | ICD-10-CM

## 2022-07-09 DIAGNOSIS — N1832 Chronic kidney disease, stage 3b: Secondary | ICD-10-CM

## 2022-07-09 DIAGNOSIS — E1169 Type 2 diabetes mellitus with other specified complication: Secondary | ICD-10-CM

## 2022-07-09 DIAGNOSIS — Z0000 Encounter for general adult medical examination without abnormal findings: Secondary | ICD-10-CM

## 2022-07-09 DIAGNOSIS — R609 Edema, unspecified: Secondary | ICD-10-CM

## 2022-07-09 DIAGNOSIS — Z0001 Encounter for general adult medical examination with abnormal findings: Secondary | ICD-10-CM

## 2022-07-09 DIAGNOSIS — Z136 Encounter for screening for cardiovascular disorders: Secondary | ICD-10-CM

## 2022-07-09 DIAGNOSIS — N183 Chronic kidney disease, stage 3 unspecified: Secondary | ICD-10-CM

## 2022-07-09 DIAGNOSIS — N138 Other obstructive and reflux uropathy: Secondary | ICD-10-CM

## 2022-07-09 DIAGNOSIS — E559 Vitamin D deficiency, unspecified: Secondary | ICD-10-CM

## 2022-07-09 DIAGNOSIS — Z1389 Encounter for screening for other disorder: Secondary | ICD-10-CM

## 2022-07-09 DIAGNOSIS — E1122 Type 2 diabetes mellitus with diabetic chronic kidney disease: Secondary | ICD-10-CM | POA: Diagnosis not present

## 2022-07-09 DIAGNOSIS — I700 Atherosclerosis of aorta: Secondary | ICD-10-CM

## 2022-07-09 DIAGNOSIS — Z79899 Other long term (current) drug therapy: Secondary | ICD-10-CM

## 2022-07-09 DIAGNOSIS — Z1329 Encounter for screening for other suspected endocrine disorder: Secondary | ICD-10-CM

## 2022-07-09 DIAGNOSIS — E1142 Type 2 diabetes mellitus with diabetic polyneuropathy: Secondary | ICD-10-CM

## 2022-07-09 NOTE — Patient Instructions (Signed)
Edema ? ?Edema is when you have too much fluid in your body or under your skin. Edema may make your legs, feet, and ankles swell. Swelling often happens in looser tissues, such as around your eyes. This is a common condition. It gets more common as you get older. ?There are many possible causes of edema. These include: ?Eating too much salt (sodium). ?Being on your feet or sitting for a long time. ?Certain medical conditions, such as: ?Pregnancy. ?Heart failure. ?Liver disease. ?Kidney disease. ?Cancer. ?Hot weather may make edema worse. Edema is usually painless. Your skin may look swollen or shiny. ?Follow these instructions at home: ?Medicines ?Take over-the-counter and prescription medicines only as told by your doctor. ?Your doctor may prescribe a medicine to help your body get rid of extra water (diuretic). Take this medicine if you are told to take it. ?Eating and drinking ?Eat a low-salt (low-sodium) diet as told by your doctor. Sometimes, eating less salt may reduce swelling. ?Depending on the cause of your swelling, you may need to limit how much fluid you drink (fluid restriction). ?General instructions ?Raise the injured area above the level of your heart while you are sitting or lying down. ?Do not sit still or stand for a long time. ?Do not wear tight clothes. Do not wear garters on your upper legs. ?Exercise your legs. This can help the swelling go down. ?Wear compression stockings as told by your doctor. It is important that these are the right size. These should be prescribed by your doctor to prevent possible injuries. ?If elastic bandages or wraps are recommended, use them as told by your doctor. ?Contact a doctor if: ?Treatment is not working. ?You have heart, liver, or kidney disease and have symptoms of edema. ?You have sudden and unexplained weight gain. ?Get help right away if: ?You have shortness of breath or chest pain. ?You cannot breathe when you lie down. ?You have pain, redness, or  warmth in the swollen areas. ?You have heart, liver, or kidney disease and get edema all of a sudden. ?You have a fever and your symptoms get worse all of a sudden. ?These symptoms may be an emergency. Get help right away. Call 911. ?Do not wait to see if the symptoms will go away. ?Do not drive yourself to the hospital. ?Summary ?Edema is when you have too much fluid in your body or under your skin. ?Edema may make your legs, feet, and ankles swell. Swelling often happens in looser tissues, such as around your eyes. ?Raise the injured area above the level of your heart while you are sitting or lying down. ?Follow your doctor's instructions about diet and how much fluid you can drink. ?This information is not intended to replace advice given to you by your health care provider. Make sure you discuss any questions you have with your health care provider. ?Document Revised: 05/13/2021 Document Reviewed: 05/13/2021 ?Elsevier Patient Education ? 2023 Elsevier Inc. ? ?

## 2022-07-10 LAB — CBC WITH DIFFERENTIAL/PLATELET
Absolute Monocytes: 746 cells/uL (ref 200–950)
Basophils Absolute: 43 cells/uL (ref 0–200)
Basophils Relative: 0.6 %
Eosinophils Absolute: 213 cells/uL (ref 15–500)
Eosinophils Relative: 3 %
HCT: 44.2 % (ref 38.5–50.0)
Hemoglobin: 14.6 g/dL (ref 13.2–17.1)
Lymphs Abs: 1647 cells/uL (ref 850–3900)
MCH: 29.9 pg (ref 27.0–33.0)
MCHC: 33 g/dL (ref 32.0–36.0)
MCV: 90.4 fL (ref 80.0–100.0)
MPV: 11 fL (ref 7.5–12.5)
Monocytes Relative: 10.5 %
Neutro Abs: 4452 cells/uL (ref 1500–7800)
Neutrophils Relative %: 62.7 %
Platelets: 183 10*3/uL (ref 140–400)
RBC: 4.89 10*6/uL (ref 4.20–5.80)
RDW: 13 % (ref 11.0–15.0)
Total Lymphocyte: 23.2 %
WBC: 7.1 10*3/uL (ref 3.8–10.8)

## 2022-07-10 LAB — PSA: PSA: 1.41 ng/mL (ref <=4.00)

## 2022-07-10 LAB — COMPLETE METABOLIC PANEL WITH GFR
AG Ratio: 1.8 (calc) (ref 1.0–2.5)
ALT: 20 U/L (ref 9–46)
AST: 17 U/L (ref 10–35)
Albumin: 4.2 g/dL (ref 3.6–5.1)
Alkaline phosphatase (APISO): 56 U/L (ref 35–144)
BUN/Creatinine Ratio: 16 (calc) (ref 6–22)
BUN: 29 mg/dL — ABNORMAL HIGH (ref 7–25)
CO2: 26 mmol/L (ref 20–32)
Calcium: 9.4 mg/dL (ref 8.6–10.3)
Chloride: 102 mmol/L (ref 98–110)
Creat: 1.79 mg/dL — ABNORMAL HIGH (ref 0.70–1.22)
Globulin: 2.4 g/dL (calc) (ref 1.9–3.7)
Glucose, Bld: 195 mg/dL — ABNORMAL HIGH (ref 65–99)
Potassium: 5 mmol/L (ref 3.5–5.3)
Sodium: 137 mmol/L (ref 135–146)
Total Bilirubin: 0.5 mg/dL (ref 0.2–1.2)
Total Protein: 6.6 g/dL (ref 6.1–8.1)
eGFR: 37 mL/min/{1.73_m2} — ABNORMAL LOW (ref >=60)

## 2022-07-10 LAB — URINALYSIS, ROUTINE W REFLEX MICROSCOPIC
Bilirubin Urine: NEGATIVE
Hgb urine dipstick: NEGATIVE
Ketones, ur: NEGATIVE
Leukocytes,Ua: NEGATIVE
Nitrite: NEGATIVE
Protein, ur: NEGATIVE
Specific Gravity, Urine: 1.018 (ref 1.001–1.035)
pH: 5.5 (ref 5.0–8.0)

## 2022-07-10 LAB — HEMOGLOBIN A1C
Hgb A1c MFr Bld: 9 % of total Hgb — ABNORMAL HIGH (ref <5.7)
Mean Plasma Glucose: 212 mg/dL
eAG (mmol/L): 11.7 mmol/L

## 2022-07-10 LAB — TSH: TSH: 3.54 mIU/L (ref 0.40–4.50)

## 2022-07-10 LAB — LIPID PANEL
Cholesterol: 166 mg/dL (ref <200)
HDL: 46 mg/dL (ref >=40)
LDL Cholesterol (Calc): 86 mg/dL (calc)
Non-HDL Cholesterol (Calc): 120 mg/dL (calc) (ref <130)
Total CHOL/HDL Ratio: 3.6 (calc) (ref <5.0)
Triglycerides: 258 mg/dL — ABNORMAL HIGH (ref <150)

## 2022-07-10 LAB — MAGNESIUM: Magnesium: 2 mg/dL (ref 1.5–2.5)

## 2022-07-10 LAB — MICROALBUMIN / CREATININE URINE RATIO
Creatinine, Urine: 112 mg/dL (ref 20–320)
Microalb Creat Ratio: 34 mcg/mg creat — ABNORMAL HIGH (ref <30)
Microalb, Ur: 3.8 mg/dL

## 2022-07-10 LAB — VITAMIN D 25 HYDROXY (VIT D DEFICIENCY, FRACTURES): Vit D, 25-Hydroxy: 62 ng/mL (ref 30–100)

## 2022-07-18 ENCOUNTER — Encounter: Payer: Medicare Other | Admitting: Acute Care

## 2022-08-05 DIAGNOSIS — N1832 Chronic kidney disease, stage 3b: Secondary | ICD-10-CM | POA: Diagnosis not present

## 2022-08-13 DIAGNOSIS — I129 Hypertensive chronic kidney disease with stage 1 through stage 4 chronic kidney disease, or unspecified chronic kidney disease: Secondary | ICD-10-CM | POA: Diagnosis not present

## 2022-08-13 DIAGNOSIS — N1832 Chronic kidney disease, stage 3b: Secondary | ICD-10-CM | POA: Diagnosis not present

## 2022-08-13 DIAGNOSIS — E1122 Type 2 diabetes mellitus with diabetic chronic kidney disease: Secondary | ICD-10-CM | POA: Diagnosis not present

## 2022-08-13 DIAGNOSIS — D631 Anemia in chronic kidney disease: Secondary | ICD-10-CM | POA: Diagnosis not present

## 2022-08-23 ENCOUNTER — Other Ambulatory Visit: Payer: Self-pay | Admitting: Internal Medicine

## 2022-08-23 MED ORDER — OLMESARTAN MEDOXOMIL 40 MG PO TABS: ORAL_TABLET | ORAL | 3 refills | Status: DC

## 2022-09-06 ENCOUNTER — Other Ambulatory Visit: Payer: Self-pay | Admitting: Internal Medicine

## 2022-09-13 ENCOUNTER — Other Ambulatory Visit: Payer: Self-pay | Admitting: Internal Medicine

## 2022-09-13 DIAGNOSIS — I10 Essential (primary) hypertension: Secondary | ICD-10-CM

## 2022-09-13 DIAGNOSIS — N401 Enlarged prostate with lower urinary tract symptoms: Secondary | ICD-10-CM

## 2022-09-13 MED ORDER — DOXAZOSIN MESYLATE 4 MG PO TABS: ORAL_TABLET | ORAL | 3 refills | Status: DC

## 2022-10-08 ENCOUNTER — Ambulatory Visit: Payer: Medicare Other | Admitting: Nurse Practitioner

## 2022-10-08 ENCOUNTER — Ambulatory Visit: Payer: Medicare Other | Admitting: Adult Health

## 2022-10-21 ENCOUNTER — Ambulatory Visit (INDEPENDENT_AMBULATORY_CARE_PROVIDER_SITE_OTHER): Payer: Medicare Other | Admitting: Nurse Practitioner

## 2022-10-21 ENCOUNTER — Encounter: Payer: Self-pay | Admitting: Nurse Practitioner

## 2022-10-21 VITALS — BP 138/78 | HR 93 | Temp 97.7°F | Ht 69.0 in | Wt 252.6 lb

## 2022-10-21 DIAGNOSIS — I700 Atherosclerosis of aorta: Secondary | ICD-10-CM

## 2022-10-21 DIAGNOSIS — Z0000 Encounter for general adult medical examination without abnormal findings: Secondary | ICD-10-CM

## 2022-10-21 DIAGNOSIS — N138 Other obstructive and reflux uropathy: Secondary | ICD-10-CM

## 2022-10-21 DIAGNOSIS — N401 Enlarged prostate with lower urinary tract symptoms: Secondary | ICD-10-CM

## 2022-10-21 DIAGNOSIS — E1169 Type 2 diabetes mellitus with other specified complication: Secondary | ICD-10-CM | POA: Diagnosis not present

## 2022-10-21 DIAGNOSIS — Z0001 Encounter for general adult medical examination with abnormal findings: Secondary | ICD-10-CM

## 2022-10-21 DIAGNOSIS — R6889 Other general symptoms and signs: Secondary | ICD-10-CM

## 2022-10-21 DIAGNOSIS — E785 Hyperlipidemia, unspecified: Secondary | ICD-10-CM

## 2022-10-21 DIAGNOSIS — N183 Chronic kidney disease, stage 3 unspecified: Secondary | ICD-10-CM

## 2022-10-21 DIAGNOSIS — N1832 Type 2 diabetes mellitus with diabetic chronic kidney disease: Secondary | ICD-10-CM

## 2022-10-21 DIAGNOSIS — Z79899 Other long term (current) drug therapy: Secondary | ICD-10-CM

## 2022-10-21 DIAGNOSIS — E1122 Type 2 diabetes mellitus with diabetic chronic kidney disease: Secondary | ICD-10-CM | POA: Diagnosis not present

## 2022-10-21 DIAGNOSIS — I10 Essential (primary) hypertension: Secondary | ICD-10-CM

## 2022-10-21 DIAGNOSIS — Z6838 Body mass index (BMI) 38.0-38.9, adult: Secondary | ICD-10-CM

## 2022-10-21 DIAGNOSIS — E1142 Type 2 diabetes mellitus with diabetic polyneuropathy: Secondary | ICD-10-CM

## 2022-10-21 DIAGNOSIS — R238 Other skin changes: Secondary | ICD-10-CM

## 2022-10-21 DIAGNOSIS — E559 Vitamin D deficiency, unspecified: Secondary | ICD-10-CM

## 2022-10-21 NOTE — Progress Notes (Signed)
AWV and FOLLOW UP  Assessment and Plan:   Annual Medicare Wellness Visit Due annually  Health maintenance reviewed  Essential hypertension Discussed DASH (Dietary Approaches to Stop Hypertension) DASH diet is lower in sodium than a typical American diet. Cut back on foods that are high in saturated fat, cholesterol, and trans fats. Eat more whole-grain foods, fish, poultry, and nuts Remain active and exercise as tolerated daily.  Monitor BP at home-Call if greater than 130/80.  Check CMP/CBC  Hyperlipidemia associated with type 2 diabetes mellitus ([place]) Discussed lifestyle modifications. Recommended diet heavy in fruits and veggies, omega 3's. Decrease consumption of animal meats, cheeses, and dairy products. Remain active and exercise as tolerated. Continue to monitor. Check lipids/TSH   Thoracic aortic atherosclerosis ([place]) - per CT 2018 Control blood pressure, lipids and glucose Disscused lifestyle modifications, diet & exercise Continue to monitor  Type 2 diabetes mellitus with stage 3b chronic kidney disease, without long-term current use of insulin [facility]) Education: Reviewed 'ABCs' of diabetes management  Discussed goals to be met and/or maintained include A1C (<7) Blood pressure (<130/80) Cholesterol (LDL <70) Continue Eye Exam yearly  Continue Dental Exam Q6 mo Discussed dietary recommendations Discussed Physical Activity recommendations Foot exam UTD Check A1C   Diabetic sensory polyneuropathy ([place]) Foot exam utd, no concerns today  Stage 3 chronic kidney disease due to type 2 diabetes mellitus ([place]) Discussed how what you eat and drink can aide in kidney protection. Stay well hydrated. Avoid high salt foods. Avoid NSAIDS. Keep BP and BG well controlled.   Take medications as prescribed. Remain active and exercise as tolerated daily. Maintain weight.  Continue to monitor. Check CMP/GFR/Microablumin   BPH with obstruction/lower urinary tract  symptoms Controlled Continue medications  Vitamin D deficiency Continue supplementation to maintain goal of 70-100 Monitor levels  Class 2 severe obesity due to excess calories with serious comorbidity and body mass index (BMI) of 37.0 to 37.9 in adult [facility])  Discussed appropriate BMI Diet modification. Physical activity. Encouraged/praised to build confidence.  Medication management All medications discussed and reviewed in full. All questions and concerns regarding medications addressed.    Other skin changes Instructed to make apt with Dr. Melford Aase for evaluation and biopsy.   Orders Placed This Encounter  Procedures   CBC with Differential/Platelet   COMPLETE METABOLIC PANEL WITH GFR   Lipid panel   Hemoglobin A1c   Meds ordered this encounter  Medications   Semaglutide, 1 MG/DOSE, 4 MG/3ML SOPN    Sig: Inject 1 mg into the skin once a week.    Dispense:  3 mL    Refill:  2    Order Specific Question:   Supervising Provider    Answer:   Unk Pinto [phone]    Notify office for further evaluation and treatment, questions or concerns if any reported s/s fail to improve.   The patient was advised to call back or seek an in-person evaluation if any symptoms worsen or if the condition fails to improve as anticipated.   Further disposition pending results of labs. Discussed med's effects and SE's.    I discussed the assessment and treatment plan with the patient. The patient was provided an opportunity to ask questions and all were answered. The patient agreed with the plan and demonstrated an understanding of the instructions.  Discussed med's effects and SE's. Screening labs and tests as requested with regular follow-up as recommended.  I provided 20 minutes of face-to-face time during this encounter including counseling, chart review, and  critical decision making was preformed.   Future Appointments  Date Time Provider [place]  07/10/2023  9:00 AM  Alycia Rossetti, NP [facility] None  01/08/2024 10:00 AM Darrol Jump, NP [facility] None   During the course of the visit the patient was educated and counseled about appropriate screening and preventive services including:   Pneumococcal vaccine  Influenza vaccine Td vaccine  Screening electrocardiogram Colorectal cancer screening Diabetes screening Glaucoma screening Nutrition counseling    -------------------------------------------------------------------------------------------------------------------------  HPI 88 y.o. male  presents for AWV and follow up on poorly controlled T2DM. He has History of colonic polyps; Essential hypertension; Poorly controlled type 2 diabetes mellitus with renal complication ([place]); Hyperlipidemia associated with type 2 diabetes mellitus ([place]); Vitamin D deficiency; Stage 3 chronic kidney disease due to type 2 diabetes mellitus ([place]); Medication management; Diabetic sensory polyneuropathy ([place]); Bilateral chronic knee pain; Aortic Atherosclerosis ([place]) by Abd CT scan 2018; Gluttony; and Class 2 severe obesity due to excess calories with serious comorbidity and body mass index (BMI) of 38.0 to 38.9 in adult [facility]) on their problem list.  Overall he reports feeling well today.  He has been concerned for a place that developed on his left nostril.  Thought it was a sore, but keeps coming back.  Denies any injury to that area.  BMI is Body mass index is 37.3 kg/m., he has been working on diet, no exercise due to knee pain.  Wt Readings from Last 3 Encounters:  10/21/22 252 lb 9.6 oz (114.6 kg)  07/09/22 262 lb 6.4 oz (119 kg)  05/22/22 255 lb (115.7 kg)   He does not check his BP at home, today their BP is BP: 138/78  BP Readings from Last 3 Encounters:  10/21/22 138/78  07/09/22 128/78  05/22/22 132/62     He does workout. He denies chest pain, shortness of breath, dizziness.   He has aortic atherosclerosis per CT 2018.    He is on  cholesterol medication (pravastatin 40 mg daily) and denies myalgias. His cholesterol is at goal. The cholesterol last visit was:   Lab Results  Component Value Date   CHOL 166 07/09/2022   HDL 46 07/09/2022   LDLCALC 86 07/09/2022   TRIG 258 (H) 07/09/2022   CHOLHDL 3.6 07/09/2022    He has not been working on diet and exercise for DMII and has CKD stage 3 P neuropathy, Bilateral lower extremities Hyperlipidemia on pravastatin On ASA, ACEi Off metformin due to GFR <56 Trulicity 1.5 mg weekly, he feels this hasn't helped as much as ozempic but insurance wouldn't cover - would like to try again. Glimepride '4mg'$  in AM and BP, half tablt if evning glucose <150. Meter: Freestyle Denies hyperglycemia, hypoglycemia , increased appetite, nausea, polydipsia and polyuria.  Last A1C in the office wasl:  Lab Results  Component Value Date   HGBA1C 9.0 (H) 07/09/2022    He has CKD IIIb associated with T2DM monitored via this office, no microalbuminuria:  Lab Results  Component Value Date   EGFR 37 (L) 07/09/2022   EGFR 34 (L) 05/14/2022   EGFR 40 (L) 01/16/2022   Patient is on Vitamin D supplement and at goal:  Lab Results  Component Value Date   VD25OH 62 07/09/2022     Current Medications:  Current Outpatient Medications on File Prior to Visit  Medication Sig   albuterol (VENTOLIN HFA) 108 (90 Base) MCG/ACT inhaler Inhale 2 puffs into the lungs every 6 (six) hours as needed for wheezing or  shortness of breath.   aspirin EC 81 MG tablet Take 81 mg by mouth at bedtime.    Blood Glucose Monitoring Suppl (ONE TOUCH ULTRA 2) w/Device KIT check blood sugar 1 time daily-E11.21   Cholecalciferol (VITAMIN D-3) 25 MCG (1000 UT) CAPS Take 1,000 Units by mouth at bedtime.   Cinnamon 500 MG TABS Take 1,000 mg by mouth daily.    doxazosin (CARDURA) 4 MG tablet Take  1 tablet  at Bedtime  for BP & Prostate   famotidine (PEPCID) 40 MG tablet Take 1 tablet Daily for Indigestion & Heartburn    fluticasone-salmeterol (ADVAIR DISKUS) 250-50 MCG/ACT AEPB Inhale 1 puff into the lungs in the morning and at bedtime.   furosemide (LASIX) 40 MG tablet Take  1 tablet  Daily for BP & Fluid Retention /Ankle Swelling                                    /                         TAKE                               BY                                MOUTH   glimepiride (AMARYL) 4 MG tablet Take  1/2 to 1 tablet  2 x /day  with Meals for Diabetes                                         /                 TAKE                          BY                    MOUTH   glucose blood (ONETOUCH ULTRA) test strip USE TO CHECK BLOOD SUGAR ONCE DAILY.   guaiFENesin (MUCINEX PO) Take by mouth.   Lancets (ONETOUCH ULTRASOFT) lancets Check blood sugar 1 time daily-E11.21   Multiple Vitamins-Minerals (ONE-A-DAY MENS 50+ ADVANTAGE) TABS Take 1 tablet by mouth daily with breakfast.   olmesartan (BENICAR) 40 MG tablet Take  1 tablet  Daily  for BP & Diabetic Kidney Protection   Omega-3 Fatty Acids (FISH OIL) 1200 MG CAPS Take 1,200 mg by mouth daily.   pravastatin (PRAVACHOL) 40 MG tablet Take  1 tablet at Bedtime tor Cholesterol                                                /                                      TAKE  BY                                                 MOUTH   promethazine-dextromethorphan (PROMETHAZINE-DM) 6.25-15 MG/5ML syrup Take 5 mLs by mouth 4 (four) times daily as needed for cough.   Semaglutide, 1 MG/DOSE, 4 MG/3ML SOPN Inject 1 mg into the skin once a week.   No current facility-administered medications on file prior to visit.    Medical History:  Past Medical History:  Diagnosis Date   AKI (acute kidney injury) ([place]) 04/20/2020   Arthritis    BPH (benign prostatic hyperplasia)    Cataract    CKD (chronic kidney disease), stage III ([place])    Depression    "usually a happy go lucky"   Diabetes mellitus    Diverticulosis    GERD  (gastroesophageal reflux disease)    sometimes will take omeprazole   Hyperlipidemia    Hypertension    Sepsis secondary to UTI ([place]) 05/04/2020   Allergies Allergies  Allergen Reactions   Tramadol Other (See Comments)    "Made his head feel funny"    SURGICAL HISTORY He  has a past surgical history that includes Tonsillectomy and adenoidectomy; Vein ligation and stripping; Knee arthroscopy; Colonoscopy (2005, 08/21/11); Fracture surgery; Cholecystectomy (N/A, 11/29/2015); and Cataract extraction, bilateral (Bilateral, 2020). FAMILY HISTORY His family history includes Diabetes in his brother; Prostate cancer in his brother. SOCIAL HISTORY He  reports that he has quit smoking. His smoking use included cigars. He has never used smokeless tobacco. He reports current alcohol use. He reports that he does not use drugs.  Immunization History  Administered Date(s) Administered   DT (Pediatric) 08/31/2015   Influenza Whole 05/24/2011, 08/06/2012, 07/07/2013   Influenza, High Dose Seasonal PF 06/21/2014, 08/31/2015, 06/03/2016, 07/15/2017, 07/29/2018, 07/04/2019, 07/02/2020, 07/09/2021, 07/09/2022   PFIZER(Purple Top)SARS-COV-2 Vaccination 10/15/2019, 11/06/2019, 08/30/2020   PNEUMOCOCCAL CONJUGATE-20 07/09/2021   Pneumococcal Conjugate-13 06/21/2014   Pneumococcal Polysaccharide-23 03/10/2005   Health Maintenance  Topic Date Due   Medicare Annual Wellness (AWV)  Never done   Zoster Vaccines- Shingrix (1 of 2) Never done   OPHTHALMOLOGY EXAM  11/09/2019   COVID-19 Vaccine (4 - 2023-24 season) 05/23/2022   FOOT EXAM  10/08/2022   HEMOGLOBIN A1C  01/08/2023   Diabetic kidney evaluation - eGFR measurement  07/10/2023   Diabetic kidney evaluation - Urine ACR  07/10/2023   DTaP/Tdap/Td (2 - Tdap) 08/30/2025   Pneumonia Vaccine 74+ Years old  Completed   INFLUENZA VACCINE  Completed   HPV VACCINES  Aged Out   COLONOSCOPY (Pts 45-59yr Insurance coverage will need to be confirmed)   Discontinued    Last colonoscopy: 2012 was due 2015 but declined another, also declines hemoccult  Eye exam: Dr. MEinar GipIn 2021, OVERDUE, reminded to schedule Dentist: 2022 has full upper partials.   Patient Care Team: MUnk Pinto MD as PCP - General (Internal Medicine) DMarlou SaGTonna Corner MD as Consulting Physician (Orthopedic Surgery) [facility] as Pharmacist (Pharmacist)   MEDICARE WELLNESS OBJECTIVES: Physical activity:   Cardiac risk factors:   Depression/mood screen:      10/21/2022   10:14 AM  Depression screen PHQ 2/9  Decreased Interest 0  Down, Depressed, Hopeless 0  PHQ - 2 Score 0    ADLs:     10/21/2022   10:12 AM 01/15/2022  10:24 PM  In your present state of health, do you have any difficulty performing the following activities:  Hearing? 0 0  Vision? 0 0  Comment Wears Rx glasses, had cataract surgery   Difficulty concentrating or making decisions? 0 0  Walking or climbing stairs? 0 0  Dressing or bathing? 0 0  Doing errands, shopping? 0 0  Preparing Food and eating ? N   In the past six months, have you accidently leaked urine? N   Do you have problems with loss of bowel control? N   Managing your Medications? N   Managing your Finances? N   Housekeeping or managing your Housekeeping? N      Cognitive Testing  Alert? Yes  Normal Appearance?Yes  Oriented to person? Yes  Place? Yes   Time? Yes  Recall of three objects?  Yes  Can perform simple calculations? Yes  Displays appropriate judgment?Yes  Can read the correct time from a watch face?Yes  EOL planning: Does Patient Have a Medical Advance Directive?: No      Review of Systems  Constitutional:  Negative for chills and fever.  HENT:  Negative for congestion, hearing loss, sinus pain, sore throat and tinnitus.   Eyes:  Negative for blurred vision and double vision.  Respiratory:  Positive for shortness of breath (on exertion, chronic unchanged). Negative  for cough, hemoptysis, sputum production and wheezing.   Cardiovascular:  Negative for chest pain, palpitations and leg swelling.  Gastrointestinal:  Negative for abdominal pain, constipation, diarrhea, heartburn, nausea and vomiting.  Genitourinary:  Negative for dysuria and urgency.       Gets up 2 times at night  Musculoskeletal:  Positive for joint pain (knees). Negative for back pain, falls, myalgias and neck pain.  Skin:  Negative for rash.  Neurological:  Negative for dizziness, tingling, tremors, weakness and headaches.  Endo/Heme/Allergies:  Does not bruise/bleed easily.  Psychiatric/Behavioral:  Negative for depression and suicidal ideas. The patient is not nervous/anxious and does not have insomnia.       Physical Exam: BP 138/78   Pulse 93   Temp 97.7 F (36.5 C)   Ht '5\' 9"'$  (1.753 m)   Wt 252 lb 9.6 oz (114.6 kg)   SpO2 98%   BMI 37.30 kg/m  Wt Readings from Last 3 Encounters:  10/21/22 252 lb 9.6 oz (114.6 kg)  07/09/22 262 lb 6.4 oz (119 kg)  05/22/22 255 lb (115.7 kg)   General Appearance: Well nourished, obese, in no apparent distress. Eyes: PERRLA, EOMs, conjunctiva no swelling or erythema Sinuses: No Frontal/maxillary tenderness ENT/Mouth: Ext aud canals clear, L TMs without erythema, bulging, R TM with extensive scarring, erythematous around bony structures (per the patient he has chronic drainage issue that has been cleared by ENT). No erythema, swelling, or exudate on post pharynx.  Tonsils not swollen or erythematous. Hearing normal.  Neck: Supple, thyroid normal.  Respiratory: Respiratory effort normal, BS equal bilaterally without rales, rhonchi, wheezing or stridor.  Cardio: RRR with no MRGs. 1+ peripheral pulses equally with 1+ non-pitting edema bilateral ankles.  Abdomen: Soft, rounded obese abdomen + BS.  Non tender, no guarding, rebound, palpable hernias or masses. Lymphatics: Non tender without lymphadenopathy.  Musculoskeletal: BIl knees with bony  enlargement, crepitus, no effusion, 5/5 strength, antalgic gait Skin: See attached picture.   Neuro: Cranial nerves intact. No cerebellar symptoms. Sensation in feel intact bilaterally to monofilament testing.  Psych: Awake and oriented X 3, normal affect, Insight and Judgment appropriate.  Medicare Attestation I have personally reviewed: The patient's medical and social history Their use of alcohol, tobacco or illicit drugs Their current medications and supplements The patient's functional ability including ADLs,fall risks, home safety risks, cognitive, and hearing and visual impairment Diet and physical activities Evidence for depression or mood disorders  The patient's weight, height, BMI, and visual acuity have been recorded in the chart.  I have made referrals, counseling, and provided education to the patient based on review of the above and I have provided the patient with a written personalized care plan for preventive services.      Darrol Jump, NP 10:15 AM [facility]

## 2022-10-21 NOTE — Patient Instructions (Signed)

## 2022-10-22 LAB — COMPLETE METABOLIC PANEL WITH GFR
AG Ratio: 1.8 (calc) (ref 1.0–2.5)
ALT: 16 U/L (ref 9–46)
AST: 14 U/L (ref 10–35)
Albumin: 4.2 g/dL (ref 3.6–5.1)
Alkaline phosphatase (APISO): 68 U/L (ref 35–144)
BUN/Creatinine Ratio: 22 (calc) (ref 6–22)
BUN: 44 mg/dL — ABNORMAL HIGH (ref 7–25)
CO2: 27 mmol/L (ref 20–32)
Calcium: 9.2 mg/dL (ref 8.6–10.3)
Chloride: 101 mmol/L (ref 98–110)
Creat: 2 mg/dL — ABNORMAL HIGH (ref 0.70–1.22)
Globulin: 2.4 g/dL (calc) (ref 1.9–3.7)
Glucose, Bld: 227 mg/dL — ABNORMAL HIGH (ref 65–99)
Potassium: 4.8 mmol/L (ref 3.5–5.3)
Sodium: 140 mmol/L (ref 135–146)
Total Bilirubin: 0.5 mg/dL (ref 0.2–1.2)
Total Protein: 6.6 g/dL (ref 6.1–8.1)
eGFR: 32 mL/min/{1.73_m2} — ABNORMAL LOW (ref >=60)

## 2022-10-22 LAB — LIPID PANEL
Cholesterol: 142 mg/dL (ref <200)
HDL: 43 mg/dL (ref >=40)
LDL Cholesterol (Calc): 70 mg/dL (calc)
Non-HDL Cholesterol (Calc): 99 mg/dL (calc) (ref <130)
Total CHOL/HDL Ratio: 3.3 (calc) (ref <5.0)
Triglycerides: 219 mg/dL — ABNORMAL HIGH (ref <150)

## 2022-10-22 LAB — CBC WITH DIFFERENTIAL/PLATELET
Absolute Monocytes: 860 cells/uL (ref 200–950)
Basophils Absolute: 26 cells/uL (ref 0–200)
Basophils Relative: 0.3 %
Eosinophils Absolute: 267 cells/uL (ref 15–500)
Eosinophils Relative: 3.1 %
HCT: 42.1 % (ref 38.5–50.0)
Hemoglobin: 14.3 g/dL (ref 13.2–17.1)
Lymphs Abs: 1746 cells/uL (ref 850–3900)
MCH: 30 pg (ref 27.0–33.0)
MCHC: 34 g/dL (ref 32.0–36.0)
MCV: 88.3 fL (ref 80.0–100.0)
MPV: 10.9 fL (ref 7.5–12.5)
Monocytes Relative: 10 %
Neutro Abs: 5702 cells/uL (ref 1500–7800)
Neutrophils Relative %: 66.3 %
Platelets: 184 10*3/uL (ref 140–400)
RBC: 4.77 10*6/uL (ref 4.20–5.80)
RDW: 12.7 % (ref 11.0–15.0)
Total Lymphocyte: 20.3 %
WBC: 8.6 10*3/uL (ref 3.8–10.8)

## 2022-10-22 LAB — HEMOGLOBIN A1C
Hgb A1c MFr Bld: 9.4 % of total Hgb — ABNORMAL HIGH (ref <5.7)
Mean Plasma Glucose: 223 mg/dL
eAG (mmol/L): 12.4 mmol/L

## 2022-10-24 ENCOUNTER — Other Ambulatory Visit: Payer: Self-pay | Admitting: Nurse Practitioner

## 2022-10-24 DIAGNOSIS — J40 Bronchitis, not specified as acute or chronic: Secondary | ICD-10-CM

## 2022-10-24 MED ORDER — SEMAGLUTIDE-WEIGHT MANAGEMENT 1.7 MG/0.75ML ~~LOC~~ SOAJ: 1.7000 mg | SUBCUTANEOUS | 2 refills | Status: DC

## 2022-10-24 MED ORDER — ALBUTEROL SULFATE HFA 108 (90 BASE) MCG/ACT IN AERS: 2.0000 | INHALATION_SPRAY | Freq: Four times a day (QID) | RESPIRATORY_TRACT | 2 refills | Status: DC | PRN

## 2022-10-27 ENCOUNTER — Other Ambulatory Visit: Payer: Self-pay | Admitting: Nurse Practitioner

## 2022-10-27 MED ORDER — SEMAGLUTIDE (1 MG/DOSE) 4 MG/3ML ~~LOC~~ SOPN: 1.0000 mg | PEN_INJECTOR | SUBCUTANEOUS | 2 refills | Status: DC

## 2022-10-28 ENCOUNTER — Telehealth: Payer: Self-pay

## 2022-10-28 NOTE — Telephone Encounter (Signed)
Prior Authorization for Ozempic completed and submitted to CoverMyMeds.

## 2022-11-03 ENCOUNTER — Encounter: Payer: Medicare Other | Admitting: Internal Medicine

## 2022-11-12 ENCOUNTER — Telehealth: Payer: Self-pay | Admitting: Nurse Practitioner

## 2022-11-12 NOTE — Telephone Encounter (Signed)
Patient's wife would like to get a refill on his Ozempic for the next dose up. I asked her what pharmacy and she states that she "doesn't know...that the medication is mailed to our office"

## 2022-11-14 ENCOUNTER — Other Ambulatory Visit: Payer: Self-pay | Admitting: Nurse Practitioner

## 2022-11-14 MED ORDER — SEMAGLUTIDE (1 MG/DOSE) 2 MG/1.5ML ~~LOC~~ SOPN: 2.0000 mg | PEN_INJECTOR | SUBCUTANEOUS | 2 refills | Status: DC

## 2022-11-28 ENCOUNTER — Telehealth: Payer: Self-pay

## 2022-11-28 NOTE — Progress Notes (Unsigned)
Patient: Edwin Osborne DOB: [birth year]   Pts wife called in stating pts needs a refill for Ozempic. She states she was attempting to give pt a shot when the needle bent and she was unable to administer the medication. HC notes pt is not enrolled with [employer]. Will connect with office to follow up with patient. HC will ask if patient is interested in enrolling. [facility] is closed for today, called pt and [facility]. Will follow up with [facility] first thing Monday AM.

## 2022-12-01 NOTE — Progress Notes (Signed)
Patient's wife Hoyle Sauer called and went over the previous story that she explained to [place] on 11/28/22. She states that patient's out of Ozempic a week early due to the needle breaking on one of the syringes. She states that she called the pharmacy ([facility]: [phone]) and they told her that her script has expired and needs to be renewed. I asked if they had done a new application for assistance for this year and she states no they had not. I advised her that this was the issue and I would help her with this. I looked up the patient assistant application and was able to re-certify online with patient on the phone. I completed the patient portion online and sent CPP email to send/print for Dr. Melford Aase to complete. I advised her that I would keep her posted on the status and that she would receive emails as well.   Time Spent: 35 min.  Hildred Alamin, CTL

## 2022-12-02 ENCOUNTER — Other Ambulatory Visit: Payer: Self-pay | Admitting: Nurse Practitioner

## 2022-12-02 ENCOUNTER — Telehealth: Payer: Self-pay | Admitting: Nurse Practitioner

## 2022-12-02 MED ORDER — SEMAGLUTIDE (1 MG/DOSE) 2 MG/1.5ML ~~LOC~~ SOPN: 2.0000 mg | PEN_INJECTOR | SUBCUTANEOUS | 2 refills | Status: DC

## 2022-12-02 NOTE — Telephone Encounter (Signed)
Pt is wanting to know if a 1 mnth supply of Ozempic can be sent to the [facility] on [address] while they wait for the mail order to send them their shipment. If it can please be sent today that would be great.

## 2022-12-02 NOTE — Telephone Encounter (Signed)
Wife called very concerned about the patient assistance paperwork to get Mattthew's ozempic mailed in on time. She has already called several times before and we have explained that she needs to speak to those on [employer] team about patient assistance paperwork and how to go about renewing that. I told her again to call the number we have for them and leave a message and wait for them to respond. I also messaged [employer] through [employer] to see if that could help things.

## 2022-12-02 NOTE — Progress Notes (Addendum)
Spoke with patient's wife this morning and explained to her that the paperwork was submitted as we were speaking. Her concern was that the patient did not have any medications for next week and going forward until a shipment come in. Per patient's chart the paperwork should have been started prior to him running out so that a lapse in medication would not have happened. Our program was not aware of this until patient 's wife called in. The only solution is to get the injection from a local pharmacy. Called to speak with her about this and got the voicemail. Left a voicemail for her to return call.   Hildred Alamin, [place]  Patient's wife returned call and she states that its too expensive to get at a local pharmacy. She is worried about patient not having any medications. I advised her that we submitted the application today and I was not sure when the first shipment will go out. She states that she did not understand why this has not been handled and I explained to her again that our program was not aware of this until she called as he is not in any of our programs due to not keeping appointment in 2022. She states that she is not understanding why the office is not being helpful in this.   Hildred Alamin, CTL

## 2022-12-03 ENCOUNTER — Other Ambulatory Visit: Payer: Self-pay

## 2022-12-03 DIAGNOSIS — E1122 Type 2 diabetes mellitus with diabetic chronic kidney disease: Secondary | ICD-10-CM

## 2022-12-03 MED ORDER — OZEMPIC (2 MG/DOSE) 8 MG/3ML ~~LOC~~ SOPN: PEN_INJECTOR | SUBCUTANEOUS | 3 refills | Status: DC

## 2022-12-10 DIAGNOSIS — E1122 Type 2 diabetes mellitus with diabetic chronic kidney disease: Secondary | ICD-10-CM | POA: Diagnosis not present

## 2022-12-10 DIAGNOSIS — I129 Hypertensive chronic kidney disease with stage 1 through stage 4 chronic kidney disease, or unspecified chronic kidney disease: Secondary | ICD-10-CM | POA: Diagnosis not present

## 2022-12-10 DIAGNOSIS — D631 Anemia in chronic kidney disease: Secondary | ICD-10-CM | POA: Diagnosis not present

## 2022-12-10 DIAGNOSIS — N1832 Chronic kidney disease, stage 3b: Secondary | ICD-10-CM | POA: Diagnosis not present

## 2023-01-21 ENCOUNTER — Encounter: Payer: Self-pay | Admitting: Internal Medicine

## 2023-01-21 NOTE — Progress Notes (Unsigned)
Future Appointments  Date Time Provider Department  01/22/2023  9:30 AM Lucky Cowboy, MD [facility]  07/10/2023            cpe  9:00 AM Raynelle Dick, NP [facility]  01/08/2024 10:00 AM Adela Glimpse, NP [facility]    History of Present Illness:       This very nice 88 y.o. MWM presents for 3 month follow up with HTN, HLD, T2_NIDDM  and Vitamin D Deficiency. In 2018 , Abd CT scan found Aortic Atherosclerosis.         Patient is treated for HTN  (1985)  & BP has been controlled at home. Today's  . Patient has had no complaints of any cardiac type chest pain, palpitations, dyspnea / orthopnea / PND, dizziness, claudication, or dependent edema.        Hyperlipidemia is controlled with diet & meds. Patient denies myalgias or other med SE's. Last Lipids were  Lab Results  Component Value Date   CHOL 142 10/21/2022   HDL 43 10/21/2022   LDLCALC 70 10/21/2022   TRIG 219 (H) 10/21/2022   CHOLHDL 3.3 10/21/2022      Also, the patient has  Morbid Obesity ( BMI 37+)   and consequent (BMI T2_NIDDM w/CKD3b (GFR 39)  and has had no symptoms of reactive hypoglycemia, diabetic polys, paresthesias or visual blurring.  Last A1c was  not at goal :  Lab Results  Component Value Date   HGBA1C 9.4 (H) 10/21/2022         Further, the patient also has history of Vitamin D Deficiency and supplements vitamin D . Last vitamin D was  Lab Results  Component Value Date   VD25OH 40 07/09/2022     Current Outpatient Medications on File Prior to Visit  Medication Sig   albuterol  HFA  inhaler Inhale 2 puffs every 6  hours as needed    aspirin EC 81 MG tablet Take t bedtime.    VITAMIN D  1000 u Take 1,000 Units by mouth at bedtime.   Cinnamon 500 MG TABS Take 1,000 mg by mouth daily.    doxazosin 4 MG tablet Take  1 tablet  at Bedtime  for BP & Prostate   famotidine  40 MG tablet Take 1 tablet Daily for Indigestion & Heartburn   ADVAIR 250-50  Inhale 1 puff - morning and bedtime.    furosemide ( 40 MG tablet Take  1 tablet  Daily    glimepiride  4 MG tablet Take  1/2 to 1 tablet  2 x /day    guaiFENesin  Take    Multiple Vitamins-Minerals  Take 1 tablet  daily with breakfast.   olmesartan   40 MG tablet Take  1 tablet  Daily    Omega-3 FISH OIL  1200 MG CAPS Take daily.   pravastatin  40 MG tablet Take  1 tablet at Bedtime    Semaglutide, 1 MG/DOSE, 2 MG/1.5ML SOPN Inject 2 mg into the skin once a week.   Semaglutide, 2 MG/DOSE, (OZEMPIC, 2 MG/DOSE,) 8 MG/3ML  Inject 2mg  into the skin once weekly for Diabetes     Allergies  Allergen Reactions   Tramadol Other (See Comments)    "Made his head feel funny"      PMHx:   Past Medical History:  Diagnosis Date   AKI (acute kidney injury) (HCC) 04/20/2020   Arthritis    BPH (benign prostatic hyperplasia)    Cataract    CKD (  chronic kidney disease), stage III (HCC)    Depression    "usually a happy go lucky"   Diabetes mellitus    Diverticulosis    GERD (gastroesophageal reflux disease)    sometimes will take omeprazole   Hyperlipidemia    Hypertension    Sepsis secondary to UTI (HCC) 05/04/2020     Immunization History  Administered Date(s) Administered   DT (Pediatric) 08/31/2015   Influenza Whole 05/24/2011, 08/06/2012, 07/07/2013   Influenza, High Dose  07/04/2019, 07/02/2020, 07/09/2021, 07/09/2022   PFIZER SARS-COV-2 Vacc 10/15/2019, 11/06/2019, 08/30/2020   PNEUMOCOCCAL CONJ 20 07/09/2021   Pneumococcal -13 06/21/2014   Pneumococcal -23 03/10/2005     Past Surgical History:  Procedure Laterality Date   CATARACT EXTRACTION, BILATERAL Bilateral 2020   Dr. Harriette Bouillon   CHOLECYSTECTOMY N/A 11/29/2015   Procedure: LAPAROSCOPIC CHOLECYSTECTOMY WITH INTRAOPERATIVE CHOLANGIOGRAM;  Surgeon: Harriette Bouillon, MD;  Location: [facility] OR;  Service: General;  Laterality: N/A;   COLONOSCOPY  2005, 08/21/11   2005 tubulovillous adenoma and adenomas 2012 - 3 adenomas, largest 10 mm, diverticulosis   FRACTURE SURGERY      ? right foot as a child   KNEE ARTHROSCOPY     TONSILLECTOMY AND ADENOIDECTOMY     VEIN LIGATION AND STRIPPING     left     FHx:    Reviewed / unchanged   SHx:    Reviewed / unchanged    Systems Review:  Constitutional: Denies fever, chills, wt changes, headaches, insomnia, fatigue, night sweats, change in appetite. Eyes: Denies redness, blurred vision, diplopia, discharge, itchy, watery eyes.  ENT: Denies discharge, congestion, post nasal drip, epistaxis, sore throat, earache, hearing loss, dental pain, tinnitus, vertigo, sinus pain, snoring.  CV: Denies chest pain, palpitations, irregular heartbeat, syncope, dyspnea, diaphoresis, orthopnea, PND, claudication or edema. Respiratory: denies cough, dyspnea, DOE, pleurisy, hoarseness, laryngitis, wheezing.  Gastrointestinal: Denies dysphagia, odynophagia, heartburn, reflux, water brash, abdominal pain or cramps, nausea, vomiting, bloating, diarrhea, constipation, hematemesis, melena, hematochezia  or hemorrhoids. Genitourinary: Denies dysuria, frequency, urgency, nocturia, hesitancy, discharge, hematuria or flank pain. Musculoskeletal: Denies arthralgias, myalgias, stiffness, jt. swelling, pain, limping or strain/sprain.  Skin: Denies pruritus, rash, hives, warts, acne, eczema or change in skin lesion(s). Neuro: No weakness, tremor, incoordination, spasms, paresthesia or pain. Psychiatric: Denies confusion, memory loss or sensory loss. Endo: Denies change in weight, skin or hair change.  Heme/Lymph: No excessive bleeding, bruising or enlarged lymph nodes.   Physical Exam  There were no vitals taken for this visit.  Appears  well nourished, well groomed  and in no distress.  Eyes: PERRLA, EOMs, conjunctiva no swelling or erythema. Sinuses: No frontal/maxillary tenderness ENT/Mouth: EAC's clear, TM's nl w/o erythema, bulging. Nares clear w/o erythema, swelling, exudates. Oropharynx clear without erythema or exudates. Oral  hygiene is good. Tongue normal, non obstructing. Hearing intact.  Neck: Supple. Thyroid not palpable. Car 2+/2+ without bruits, nodes or JVD. Chest: Respirations nl with BS clear & equal w/o rales, rhonchi, wheezing or stridor.  Cor: Heart sounds normal w/ regular rate and rhythm without sig. murmurs, gallops, clicks or rubs. Peripheral pulses normal and equal  without edema.  Abdomen: Soft & bowel sounds normal. Non-tender w/o guarding, rebound, hernias, masses or organomegaly.  Lymphatics: Unremarkable.  Musculoskeletal: Full ROM all peripheral extremities, joint stability, 5/5 strength and normal gait.  Skin: Warm, dry without exposed rashes, lesions or ecchymosis apparent.  Neuro: Cranial nerves intact, reflexes equal bilaterally. Sensory-motor testing grossly intact. Tendon reflexes grossly intact.  Pysch: Alert & oriented  x 3.  Insight and judgement nl & appropriate. No ideations.   Assessment and Plan:   1. Essential hypertension  - Continue medication, monitor blood pressure at home.  - Continue DASH diet.  Reminder to go to the ER if any CP,  SOB, nausea, dizziness, severe HA, changes vision/speech.   - CBC with Differential/Platelet - COMPLETE METABOLIC PANEL WITH GFR - Magnesium - TSH  2. Hyperlipidemia associated with type 2 diabetes mellitus (HCC)  - Continue diet/meds, exercise,& lifestyle modifications.  - Continue monitor periodic cholesterol/liver & renal functions    - Lipid panel - TSH  3. Type 2 diabetes mellitus with stage 3b chronic kidney                        disease, without long-term current use of insulin (HCC)  - Continue diet, exercise  - Lifestyle modifications.  - Monitor appropriate labs   - Hemoglobin A1c - Insulin, random  4. Vitamin D deficiency  - Continue supplementation   - VITAMIN D 25 Hydroxy   5. Aortic Atherosclerosis (HCC) by Abd CT scan 2018  - Lipid panel  6. Medication management  - CBC with  Differential/Platelet - COMPLETE METABOLIC PANEL WITH GFR - Magnesium - Lipid panel - TSH - Hemoglobin A1c - Insulin, random - VITAMIN D 25 Hydroxy        Discussed  regular exercise, BP monitoring, weight control to achieve/maintain BMI less than 25 and discussed med and SE's. Recommended labs to assess /monitor clinical status .  I discussed the assessment and treatment plan with the patient. The patient was provided an opportunity to ask questions and all were answered. The patient agreed with the plan and demonstrated an understanding of the instructions.  I provided over 30 minutes of exam, counseling, chart review and  complex critical decision making.        The patient was advised to call back or seek an in-person evaluation if the symptoms worsen or if the condition fails to improve as anticipated.   Marinus Maw, MD

## 2023-01-21 NOTE — Patient Instructions (Signed)

## 2023-01-22 ENCOUNTER — Ambulatory Visit (INDEPENDENT_AMBULATORY_CARE_PROVIDER_SITE_OTHER): Payer: Medicare Other | Admitting: Internal Medicine

## 2023-01-22 ENCOUNTER — Encounter: Payer: Self-pay | Admitting: Internal Medicine

## 2023-01-22 VITALS — BP 138/70 | HR 96 | Temp 97.8°F | Resp 16 | Ht 69.0 in | Wt 251.0 lb

## 2023-01-22 DIAGNOSIS — C44301 Unspecified malignant neoplasm of skin of nose: Secondary | ICD-10-CM

## 2023-01-22 DIAGNOSIS — E1122 Type 2 diabetes mellitus with diabetic chronic kidney disease: Secondary | ICD-10-CM

## 2023-01-22 DIAGNOSIS — N1832 Chronic kidney disease, stage 3b: Secondary | ICD-10-CM

## 2023-01-22 DIAGNOSIS — E559 Vitamin D deficiency, unspecified: Secondary | ICD-10-CM

## 2023-01-22 DIAGNOSIS — E785 Hyperlipidemia, unspecified: Secondary | ICD-10-CM

## 2023-01-22 DIAGNOSIS — E1169 Type 2 diabetes mellitus with other specified complication: Secondary | ICD-10-CM | POA: Diagnosis not present

## 2023-01-22 DIAGNOSIS — I10 Essential (primary) hypertension: Secondary | ICD-10-CM | POA: Diagnosis not present

## 2023-01-22 DIAGNOSIS — I700 Atherosclerosis of aorta: Secondary | ICD-10-CM

## 2023-01-22 DIAGNOSIS — Z79899 Other long term (current) drug therapy: Secondary | ICD-10-CM | POA: Diagnosis not present

## 2023-01-23 LAB — COMPLETE METABOLIC PANEL WITH GFR
AG Ratio: 1.7 (calc) (ref 1.0–2.5)
ALT: 25 U/L (ref 9–46)
AST: 19 U/L (ref 10–35)
Albumin: 4.1 g/dL (ref 3.6–5.1)
Alkaline phosphatase (APISO): 67 U/L (ref 35–144)
BUN/Creatinine Ratio: 15 (calc) (ref 6–22)
BUN: 28 mg/dL — ABNORMAL HIGH (ref 7–25)
CO2: 29 mmol/L (ref 20–32)
Calcium: 9.4 mg/dL (ref 8.6–10.3)
Chloride: 103 mmol/L (ref 98–110)
Creat: 1.91 mg/dL — ABNORMAL HIGH (ref 0.70–1.22)
Globulin: 2.4 g/dL (calc) (ref 1.9–3.7)
Glucose, Bld: 167 mg/dL — ABNORMAL HIGH (ref 65–99)
Potassium: 5.4 mmol/L — ABNORMAL HIGH (ref 3.5–5.3)
Sodium: 143 mmol/L (ref 135–146)
Total Bilirubin: 0.4 mg/dL (ref 0.2–1.2)
Total Protein: 6.5 g/dL (ref 6.1–8.1)
eGFR: 34 mL/min/{1.73_m2} — ABNORMAL LOW (ref >=60)

## 2023-01-23 LAB — TSH: TSH: 3.58 mIU/L (ref 0.40–4.50)

## 2023-01-23 LAB — CBC WITH DIFFERENTIAL/PLATELET
Absolute Monocytes: 744 cells/uL (ref 200–950)
Basophils Absolute: 40 cells/uL (ref 0–200)
Basophils Relative: 0.5 %
Eosinophils Absolute: 192 cells/uL (ref 15–500)
Eosinophils Relative: 2.4 %
HCT: 41.9 % (ref 38.5–50.0)
Hemoglobin: 13.6 g/dL (ref 13.2–17.1)
Lymphs Abs: 1776 cells/uL (ref 850–3900)
MCH: 29.1 pg (ref 27.0–33.0)
MCHC: 32.5 g/dL (ref 32.0–36.0)
MCV: 89.7 fL (ref 80.0–100.0)
MPV: 10.6 fL (ref 7.5–12.5)
Monocytes Relative: 9.3 %
Neutro Abs: 5248 cells/uL (ref 1500–7800)
Neutrophils Relative %: 65.6 %
Platelets: 189 10*3/uL (ref 140–400)
RBC: 4.67 10*6/uL (ref 4.20–5.80)
RDW: 12.6 % (ref 11.0–15.0)
Total Lymphocyte: 22.2 %
WBC: 8 10*3/uL (ref 3.8–10.8)

## 2023-01-23 LAB — MAGNESIUM: Magnesium: 2.2 mg/dL (ref 1.5–2.5)

## 2023-01-23 LAB — VITAMIN D 25 HYDROXY (VIT D DEFICIENCY, FRACTURES): Vit D, 25-Hydroxy: 56 ng/mL (ref 30–100)

## 2023-01-23 LAB — LIPID PANEL
Cholesterol: 156 mg/dL (ref <200)
HDL: 41 mg/dL (ref >=40)
LDL Cholesterol (Calc): 78 mg/dL (calc)
Non-HDL Cholesterol (Calc): 115 mg/dL (calc) (ref <130)
Total CHOL/HDL Ratio: 3.8 (calc) (ref <5.0)
Triglycerides: 283 mg/dL — ABNORMAL HIGH (ref <150)

## 2023-01-23 LAB — HEMOGLOBIN A1C
Hgb A1c MFr Bld: 9.4 % of total Hgb — ABNORMAL HIGH (ref <5.7)
Mean Plasma Glucose: 223 mg/dL
eAG (mmol/L): 12.4 mmol/L

## 2023-01-23 LAB — INSULIN, RANDOM: Insulin: 27.1 u[IU]/mL — ABNORMAL HIGH

## 2023-01-24 ENCOUNTER — Other Ambulatory Visit: Payer: Self-pay | Admitting: Internal Medicine

## 2023-01-24 NOTE — Progress Notes (Signed)
^<^<^<^<^<^<^<^<^<^<^<^<^<^<^<^<^<^<^<^<^<^<^<^<^<^<^<^<^<^<^<^<^<^<^<^<^ ^>^>^>^>^>^>^>^>^>^>^>>^>^>^>^>^>^>^>^>^>^>^>^>^>^>^>^>^>^>^>^>^>^>^>^>^>  -  Glucose = 167 - way too High                                         ( Normal is less than 100 mg% )    And A1c = 9.4% - STILL WAY TOO HIGH &                                                                   increased risk for complications of Diabetes   - If you don't get on a better diet & lose weight , you will pay  the price   Being diabetic has a  300% increased risk for heart attack,                                                 stroke, cancer, and alzheimer- type vascular dementia.   It is very important that you work harder with diet by                                 avoiding all foods that are white except chicken, fish & calliflower.  - Avoid white rice  (brown & wild rice is OK),   - Avoid white potatoes  (sweet potatoes in moderation is OK),   White bread or wheat bread or anything made out of   white flour like bagels, donuts, rolls, buns, biscuits, cakes,  - pastries, cookies, pizza crust, and pasta (made from  white flour & egg whites)   - vegetarian pasta or spinach or wheat pasta is OK.  - Multigrain breads like Arnold's, [employer] or   multigrain sandwich thins or high fiber breads like   [place] bread or "Dave[employer] Killer" breads that are  4 to 5 grams fiber per slice !  are best.    Diet, exercise and weight loss can reverse and cure  diabetes in the early stages.    - Diet, exercise and weight loss is very important in the   control and prevention of complications of diabetes which                                                                       affects every system in your body, ie.   -Brain - dementia/stroke,  - eyes - glaucoma/blindness,  - heart - heart attack/heart failure,  - kidneys - dialysis,  - stomach - gastric paralysis,  - intestines - malabsorption,  - nerves - severe  painful neuritis,  - circulation - gangrene & loss of a leg(s)  - and finally  . . . . . . . . . . . . . . . . . Marland Kitchen    -  cancer and Alzheimers. ^<^<^<^<^<^<^<^<^<^<^<^<^<^<^<^<^<^<^<^<^<^<^<^<^<^<^<^<^<^<^<^<^<^<^<^<^  -  Vit D = 56 is to low - Goal is 70-100,                                             So recommend increase daily dose to 5,000 units  /daily  ^>^>^>^>^>^>^>^>^>^>^>^>^>^>^>^>^>^>^>^>^>^>^>^>^>^>^>^>^>^>^>^>^>^>^>^>^  -  Chol = 156 & LDL = 78 - Both "OK" , but   Triglycerides ( = 283 ) or fats in blood are way  too high                 (   Ideal or  Goal is less than 150  !  )    - Recommend avoid fried & greasy foods,  sweets / candy,   - Avoid white rice  (brown or wild rice or Quinoa is OK),   - Avoid white potatoes  (sweet potatoes are OK)   - Avoid anything made from white flour  - bagels, doughnuts, rolls, buns, biscuits, white and   wheat breads, pizza crust and traditional                                          pasta made of white flour & egg white  - (vegetarian pasta or spinach or wheat pasta is OK).    - Multi-grain bread is OK - like multi-grain flat bread or sandwich thins.   - Avoid alcohol in excess.   - Exercise is also important. ^>^>^>^>^>^>^>^>^>^>^>^>^>^>^>^>^>^>^>^>^>^>^>^>^>^>^>^>^>^>^>^>^>^>^>^>^

## 2023-02-11 DIAGNOSIS — C44329 Squamous cell carcinoma of skin of other parts of face: Secondary | ICD-10-CM | POA: Diagnosis not present

## 2023-02-11 DIAGNOSIS — C44311 Basal cell carcinoma of skin of nose: Secondary | ICD-10-CM | POA: Diagnosis not present

## 2023-02-11 DIAGNOSIS — D485 Neoplasm of uncertain behavior of skin: Secondary | ICD-10-CM | POA: Diagnosis not present

## 2023-02-11 DIAGNOSIS — L738 Other specified follicular disorders: Secondary | ICD-10-CM | POA: Diagnosis not present

## 2023-02-25 DIAGNOSIS — D0439 Carcinoma in situ of skin of other parts of face: Secondary | ICD-10-CM | POA: Diagnosis not present

## 2023-03-23 DIAGNOSIS — N1832 Chronic kidney disease, stage 3b: Secondary | ICD-10-CM | POA: Diagnosis not present

## 2023-04-01 DIAGNOSIS — E1122 Type 2 diabetes mellitus with diabetic chronic kidney disease: Secondary | ICD-10-CM | POA: Diagnosis not present

## 2023-04-01 DIAGNOSIS — N1832 Chronic kidney disease, stage 3b: Secondary | ICD-10-CM | POA: Diagnosis not present

## 2023-04-01 DIAGNOSIS — I129 Hypertensive chronic kidney disease with stage 1 through stage 4 chronic kidney disease, or unspecified chronic kidney disease: Secondary | ICD-10-CM | POA: Diagnosis not present

## 2023-04-01 DIAGNOSIS — D631 Anemia in chronic kidney disease: Secondary | ICD-10-CM | POA: Diagnosis not present

## 2023-04-02 DIAGNOSIS — C44311 Basal cell carcinoma of skin of nose: Secondary | ICD-10-CM | POA: Diagnosis not present

## 2023-04-03 ENCOUNTER — Other Ambulatory Visit: Payer: Self-pay | Admitting: Internal Medicine

## 2023-04-09 ENCOUNTER — Encounter (HOSPITAL_BASED_OUTPATIENT_CLINIC_OR_DEPARTMENT_OTHER): Payer: Self-pay

## 2023-04-09 ENCOUNTER — Other Ambulatory Visit: Payer: Self-pay

## 2023-04-09 ENCOUNTER — Emergency Department (HOSPITAL_BASED_OUTPATIENT_CLINIC_OR_DEPARTMENT_OTHER)
Admission: EM | Admit: 2023-04-09 | Discharge: 2023-04-10 | Disposition: A | Discharge status: Home / Self Care | Payer: Medicare Other | Attending: Student | Admitting: Student

## 2023-04-09 ENCOUNTER — Emergency Department (HOSPITAL_BASED_OUTPATIENT_CLINIC_OR_DEPARTMENT_OTHER): Payer: Medicare Other

## 2023-04-09 DIAGNOSIS — I129 Hypertensive chronic kidney disease with stage 1 through stage 4 chronic kidney disease, or unspecified chronic kidney disease: Secondary | ICD-10-CM | POA: Insufficient documentation

## 2023-04-09 DIAGNOSIS — N183 Chronic kidney disease, stage 3 unspecified: Secondary | ICD-10-CM | POA: Insufficient documentation

## 2023-04-09 DIAGNOSIS — K5730 Diverticulosis of large intestine without perforation or abscess without bleeding: Secondary | ICD-10-CM | POA: Diagnosis not present

## 2023-04-09 DIAGNOSIS — Z7982 Long term (current) use of aspirin: Secondary | ICD-10-CM | POA: Diagnosis not present

## 2023-04-09 DIAGNOSIS — E1122 Type 2 diabetes mellitus with diabetic chronic kidney disease: Secondary | ICD-10-CM | POA: Diagnosis not present

## 2023-04-09 DIAGNOSIS — R1084 Generalized abdominal pain: Secondary | ICD-10-CM | POA: Diagnosis not present

## 2023-04-09 DIAGNOSIS — R109 Unspecified abdominal pain: Secondary | ICD-10-CM | POA: Diagnosis not present

## 2023-04-09 DIAGNOSIS — K5900 Constipation, unspecified: Secondary | ICD-10-CM | POA: Diagnosis not present

## 2023-04-09 DIAGNOSIS — Z87891 Personal history of nicotine dependence: Secondary | ICD-10-CM | POA: Diagnosis not present

## 2023-04-09 LAB — CBC WITH DIFFERENTIAL/PLATELET
Abs Immature Granulocytes: 0.04 10*3/uL (ref 0.00–0.07)
Basophils Absolute: 0 10*3/uL (ref 0.0–0.1)
Basophils Relative: 0 %
Eosinophils Absolute: 0.1 10*3/uL (ref 0.0–0.5)
Eosinophils Relative: 1 %
HCT: 38.8 % — ABNORMAL LOW (ref 39.0–52.0)
Hemoglobin: 13.3 g/dL (ref 13.0–17.0)
Immature Granulocytes: 0 %
Lymphocytes Relative: 17 %
Lymphs Abs: 1.7 10*3/uL (ref 0.7–4.0)
MCH: 30 pg (ref 26.0–34.0)
MCHC: 34.3 g/dL (ref 30.0–36.0)
MCV: 87.6 fL (ref 80.0–100.0)
Monocytes Absolute: 1.2 10*3/uL — ABNORMAL HIGH (ref 0.1–1.0)
Monocytes Relative: 12 %
Neutro Abs: 7.1 10*3/uL (ref 1.7–7.7)
Neutrophils Relative %: 70 %
Platelets: 156 10*3/uL (ref 150–400)
RBC: 4.43 MIL/uL (ref 4.22–5.81)
RDW: 12.9 % (ref 11.5–15.5)
WBC: 10.3 10*3/uL (ref 4.0–10.5)
nRBC: 0 % (ref 0.0–0.2)

## 2023-04-09 NOTE — ED Triage Notes (Signed)
Patient here POV from Home.  Endorses Constipation for 4-5 Days. Typically has BM daily. Some Nausea. No emesis. No Diarrhea. No known Fevers. Some ABD Discomfort.   Noted during Triage. A&Ox4. GCS 15. Ambulatory.

## 2023-04-10 LAB — URINALYSIS, ROUTINE W REFLEX MICROSCOPIC
Bilirubin Urine: NEGATIVE
Glucose, UA: 100 mg/dL — AB
Hgb urine dipstick: NEGATIVE
Ketones, ur: NEGATIVE mg/dL
Leukocytes,Ua: NEGATIVE
Nitrite: NEGATIVE
Protein, ur: NEGATIVE mg/dL
Specific Gravity, Urine: 1.011 (ref 1.005–1.030)
pH: 5.5 (ref 5.0–8.0)

## 2023-04-10 LAB — COMPREHENSIVE METABOLIC PANEL
ALT: 23 U/L (ref 0–44)
AST: 22 U/L (ref 15–41)
Albumin: 4 g/dL (ref 3.5–5.0)
Alkaline Phosphatase: 58 U/L (ref 38–126)
Anion gap: 8 (ref 5–15)
BUN: 34 mg/dL — ABNORMAL HIGH (ref 8–23)
CO2: 29 mmol/L (ref 22–32)
Calcium: 9 mg/dL (ref 8.9–10.3)
Chloride: 100 mmol/L (ref 98–111)
Creatinine, Ser: 1.95 mg/dL — ABNORMAL HIGH (ref 0.61–1.24)
GFR, Estimated: 33 mL/min — ABNORMAL LOW (ref >60)
Glucose, Bld: 163 mg/dL — ABNORMAL HIGH (ref 70–99)
Potassium: 4.2 mmol/L (ref 3.5–5.1)
Sodium: 137 mmol/L (ref 135–145)
Total Bilirubin: 0.6 mg/dL (ref 0.3–1.2)
Total Protein: 6.6 g/dL (ref 6.5–8.1)

## 2023-04-10 NOTE — ED Provider Notes (Signed)
[place] EMERGENCY DEPARTMENT AT [facility] Provider Note  CSN: [id number] Arrival date & time: 04/09/23 2152  Chief Complaint(s) Constipation  HPI Edwin Osborne is a 88 y.o. male with PMH CKD T2DM, GERD, HTN, HLD who presents emergency department for evaluation of abdominal pain and constipation.  He states that he has not had a solid bowel movement for the last 4 to 5 days.  Family at home has used magnesium citrate, enemas and oral laxative without improvement.  He states that this evening he had an episode of left flank pain that was severe but has since resolved upon arrival here in the ER.  Currently denies chest pain, shortness of breath, abdominal pain, nausea, vomiting or other systemic symptoms.   Past Medical History Past Medical History:  Diagnosis Date   AKI (acute kidney injury) (HCC) 04/20/2020   Arthritis    BPH (benign prostatic hyperplasia)    Cataract    CKD (chronic kidney disease), stage III (HCC)    Depression    "usually a happy go lucky"   Diabetes mellitus    Diverticulosis    GERD (gastroesophageal reflux disease)    sometimes will take omeprazole   Hyperlipidemia    Hypertension    Sepsis secondary to UTI (HCC) 05/04/2020   Patient Active Problem List   Diagnosis Date Noted   Class 2 severe obesity due to excess calories with serious comorbidity and body mass index (BMI) of 38.0 to 38.9 in adult (HCC) 01/17/2021   Gluttony 10/04/2020   Aortic Atherosclerosis (HCC) by Abd CT scan 2018 09/26/2019   Bilateral chronic knee pain 08/15/2019   Diabetic sensory polyneuropathy (HCC) 01/18/2016   Stage 3 chronic kidney disease due to type 2 diabetes mellitus (HCC) 03/07/2014   Medication management 03/07/2014   Hyperlipidemia associated with type 2 diabetes mellitus (HCC) 08/15/2013   Vitamin D deficiency 08/15/2013   Poorly controlled type 2 diabetes mellitus with renal complication (HCC) 07/28/2013   Essential hypertension    History of  colonic polyps 04/09/2004   Home Medication(s) Prior to Admission medications   Medication Sig Start Date End Date Taking? Authorizing Provider  albuterol (VENTOLIN HFA) 108 (90 Base) MCG/ACT inhaler Inhale 2 puffs into the lungs every 6 (six) hours as needed for wheezing or shortness of breath. 10/24/22   Adela Glimpse, NP  aspirin EC 81 MG tablet Take 81 mg by mouth at bedtime.     [provider]  Blood Glucose Monitoring Suppl (ONE TOUCH ULTRA 2) w/Device KIT check blood sugar 1 time daily-E11.21 11/25/19   Lucky Cowboy, MD  Cholecalciferol (VITAMIN D-3) 25 MCG (1000 UT) CAPS Take 1,000 Units by mouth at bedtime.    [provider]  Cinnamon 500 MG TABS Take 1,000 mg by mouth daily.     [provider]  doxazosin (CARDURA) 4 MG tablet Take  1 tablet  at Bedtime  for BP & Prostate 09/13/22   Lucky Cowboy, MD  famotidine (PEPCID) 40 MG tablet Take 1 tablet Daily for Indigestion & Heartburn 08/08/19   Lucky Cowboy, MD  fluticasone-salmeterol (ADVAIR DISKUS) 250-50 MCG/ACT AEPB Inhale 1 puff into the lungs in the morning and at bedtime. 05/22/22   Martina Sinner, MD  furosemide (LASIX) 40 MG tablet Take  1 tablet  Daily for BP & Fluid Retention /Ankle Swelling                                    /  TAKE                               BY                                MOUTH 05/31/22   Lucky Cowboy, MD  glimepiride (AMARYL) 4 MG tablet Take 1/2 to 1 tablet  2 x /day with Meals for Diabetes                                                                                                    TAKE 1/2 TO 1 TABLET BY MOUTH TWICE DAILY WITH MEALS FOR DIABETES 04/04/23   Lucky Cowboy, MD  glucose blood (ONETOUCH ULTRA) test strip USE TO CHECK BLOOD SUGAR ONCE DAILY. 03/26/22   Lucky Cowboy, MD  Lancets [facility] ULTRASOFT) lancets Check blood sugar 1 time daily-E11.21 11/25/19   Lucky Cowboy, MD  Multiple Vitamins-Minerals (ONE-A-DAY MENS  50+ ADVANTAGE) TABS Take 1 tablet by mouth daily with breakfast.    [provider]  olmesartan (BENICAR) 40 MG tablet Take  1 tablet  Daily  for BP & Diabetic Kidney Protection 08/23/22   Lucky Cowboy, MD  Omega-3 Fatty Acids (FISH OIL) 1200 MG CAPS Take 1,200 mg by mouth daily.    [provider]  pravastatin (PRAVACHOL) 40 MG tablet Take  1 tablet at Bedtime tor Cholesterol                                                /                                      TAKE                                                          BY                                                 MOUTH 09/06/22   Lucky Cowboy, MD  Semaglutide, 2 MG/DOSE, (OZEMPIC, 2 MG/DOSE,) 8 MG/3ML SOPN Inject 2mg  into the skin once weekly for Diabetes 12/03/22   Adela Glimpse, NP  Past Surgical History Past Surgical History:  Procedure Laterality Date   CATARACT EXTRACTION, BILATERAL Bilateral 2020   Dr. Harriette Bouillon   CHOLECYSTECTOMY N/A 11/29/2015   Procedure: LAPAROSCOPIC CHOLECYSTECTOMY WITH INTRAOPERATIVE CHOLANGIOGRAM;  Surgeon: Harriette Bouillon, MD;  Location: [facility];  Service: General;  Laterality: N/A;   COLONOSCOPY  2005, 08/21/11   2005 tubulovillous adenoma and adenomas 2012 - 3 adenomas, largest 10 mm, diverticulosis   FRACTURE SURGERY     ? right foot as a child   KNEE ARTHROSCOPY     TONSILLECTOMY AND ADENOIDECTOMY     VEIN LIGATION AND STRIPPING     left   Family History Family History  Problem Relation Age of Onset   Prostate cancer Brother    Diabetes Brother    Colon cancer Neg Hx     Social History Social History   Tobacco Use   Smoking status: Former    Types: Cigars   Smokeless tobacco: Never   Tobacco comments:    once weekly cigars, remotely chewed tobacco  Substance Use Topics   Alcohol use: Yes    Comment: once monthly   Drug use: No    Allergies Tramadol  Review of Systems Review of Systems  Gastrointestinal:  Positive for abdominal pain and constipation.    Physical Exam Vital Signs  I have reviewed the triage vital signs BP 138/61   Pulse 78   Temp (!) 97.5 F (36.4 C)   Resp 18   Ht 5\' 10"  (1.778 m)   Wt 113.4 kg   SpO2 92%   BMI 35.87 kg/m   Physical Exam Constitutional:      General: He is not in acute distress.    Appearance: Normal appearance.  HENT:     Head: Normocephalic and atraumatic.     Nose: No congestion or rhinorrhea.  Eyes:     General:        Right eye: No discharge.        Left eye: No discharge.     Extraocular Movements: Extraocular movements intact.     Pupils: Pupils are equal, round, and reactive to light.  Cardiovascular:     Rate and Rhythm: Normal rate and regular rhythm.     Heart sounds: No murmur heard. Pulmonary:     Effort: No respiratory distress.     Breath sounds: No wheezing or rales.  Abdominal:     General: There is no distension.     Tenderness: There is no abdominal tenderness.  Musculoskeletal:        General: Normal range of motion.     Cervical back: Normal range of motion.  Skin:    General: Skin is warm and dry.  Neurological:     General: No focal deficit present.     Mental Status: He is alert.     ED Results and Treatments Labs (all labs ordered are listed, but only abnormal results are displayed) Labs Reviewed  COMPREHENSIVE METABOLIC PANEL - Abnormal; Notable for the following components:      Result Value   Glucose, Bld 163 (*)    BUN 34 (*)    Creatinine, Ser 1.95 (*)    GFR, Estimated 33 (*)    All other components within normal limits  CBC WITH DIFFERENTIAL/PLATELET - Abnormal; Notable for the following components:   HCT 38.8 (*)    Monocytes Absolute 1.2 (*)    All other components within normal limits  URINALYSIS, ROUTINE W REFLEX MICROSCOPIC - Abnormal; Notable for  the following components:   Glucose, UA 100 (*)     All other components within normal limits                                                                                                                          Radiology CT ABDOMEN PELVIS WO CONTRAST  Result Date: 04/09/2023 CLINICAL DATA:  Bowel obstruction, nausea, constipation EXAM: CT ABDOMEN AND PELVIS WITHOUT CONTRAST TECHNIQUE: Multidetector CT imaging of the abdomen and pelvis was performed following the standard protocol without IV contrast. RADIATION DOSE REDUCTION: This exam was performed according to the departmental dose-optimization program which includes automated exposure control, adjustment of the mA and/or kV according to patient size and/or use of iterative reconstruction technique. COMPARISON:  04/20/2020 FINDINGS: Lower chest: No acute abnormality. Extensive coronary artery calcification Hepatobiliary: No focal liver abnormality is seen. Status post cholecystectomy. No biliary dilatation. Pancreas: Unremarkable Spleen: Unremarkable Adrenals/Urinary Tract: The adrenal glands are unremarkable. The kidneys are normal in size and position. Moderate bilateral nonspecific perinephric stranding is again identified but appears slightly progressive since prior examination. This is nonspecific, but may relate to underlying infection or inflammation. No hydronephrosis. No intrarenal or ureteral calculi. No perinephric fluid collections. The bladder is unremarkable. Stomach/Bowel: Severe descending colonic diverticulosis without superimposed acute inflammatory change. Stomach, small bowel, and large bowel are otherwise unremarkable. Appendix normal. No free intraperitoneal gas or fluid. Relatively small colonic stool burden. Vascular/Lymphatic: Aortic atherosclerosis. No enlarged abdominal or pelvic lymph nodes. Reproductive: Prostate is unremarkable. Other: No abdominal wall hernia Musculoskeletal: Degenerative changes are seen within the lumbar spine. No acute bone abnormality. IMPRESSION: 1.  Moderate bilateral nonspecific perinephric stranding, slightly progressive since prior examination. This is nonspecific, but may relate to underlying infection or inflammation. Correlation with renal function tests and urinalysis may be helpful. 2. Severe descending colonic diverticulosis without superimposed acute inflammatory change. 3. Extensive coronary artery calcification. 4. Aortic atherosclerosis. Aortic Atherosclerosis (ICD10-I70.0). Electronically Signed   By: Helyn Numbers M.D.   On: 04/09/2023 23:30    Pertinent labs & imaging results that were available during my care of the patient were reviewed by me and considered in my medical decision making (see MDM for details).  Medications Ordered in ED Medications - No data to display  Procedures Procedures  (including critical care time)  Medical Decision Making / ED Course   This patient presents to the ED for concern of abdominal pain, constipation, this involves an extensive number of treatment options, and is a complaint that carries with it a high risk of complications and morbidity.  The differential diagnosis includes diverticulitis, epiploic appendagitis, colitis, gastroenteritis, constipation, nephrolithiasis, inflammatory bowel disease,  MDM: Patient seen emerged part for evaluation of abdominal pain and constipation.  Physical exam is largely unremarkable.  Laboratory evaluation with a BUN of 34, creatinine 1.95 which is this patient's baseline.  Urinalysis is unremarkable.  CT abdomen pelvis with some nonspecific perinephric stranding bilaterally but no evidence of significant stool burden.  Patient is having multiple watery bowel movements while here in the emergency department likely secondary to the patient's magnesium citrate administration prior to arrival.  He remains asymptomatic after 3 and  half hours here in the emergency department and at this time does not meet inpatient criteria for admission.  With negative workup, he is safe for discharge with outpatient follow-up.  Patient discharged.   Additional history obtained: -Additional history obtained from multiple family members -External records from outside source obtained and reviewed including: Chart review including previous notes, labs, imaging, consultation notes   Lab Tests: -I ordered, reviewed, and interpreted labs.   The pertinent results include:   Labs Reviewed  COMPREHENSIVE METABOLIC PANEL - Abnormal; Notable for the following components:      Result Value   Glucose, Bld 163 (*)    BUN 34 (*)    Creatinine, Ser 1.95 (*)    GFR, Estimated 33 (*)    All other components within normal limits  CBC WITH DIFFERENTIAL/PLATELET - Abnormal; Notable for the following components:   HCT 38.8 (*)    Monocytes Absolute 1.2 (*)    All other components within normal limits  URINALYSIS, ROUTINE W REFLEX MICROSCOPIC - Abnormal; Notable for the following components:   Glucose, UA 100 (*)    All other components within normal limits         Imaging Studies ordered: I ordered imaging studies including CT AP I independently visualized and interpreted imaging. I agree with the radiologist interpretation   Medicines ordered and prescription drug management: No orders of the defined types were placed in this encounter.   -I have reviewed the patients home medicines and have made adjustments as needed  Critical interventions none   Cardiac Monitoring: The patient was maintained on a cardiac monitor.  I personally viewed and interpreted the cardiac monitored which showed an underlying rhythm of: NSR  Social Determinants of Health:  Factors impacting patients care include: none   Reevaluation: After the interventions noted above, I reevaluated the patient and found that they have :improved  Co morbidities  that complicate the patient evaluation  Past Medical History:  Diagnosis Date   AKI (acute kidney injury) (HCC) 04/20/2020   Arthritis    BPH (benign prostatic hyperplasia)    Cataract    CKD (chronic kidney disease), stage III (HCC)    Depression    "usually a happy go lucky"   Diabetes mellitus    Diverticulosis    GERD (gastroesophageal reflux disease)    sometimes will take omeprazole   Hyperlipidemia    Hypertension    Sepsis secondary to UTI (HCC) 05/04/2020      Dispostion: I considered admission for this patient, but at this time he does not meet inpatient criteria for admission and he  is safe for discharge with outpatient follow-up     Final Clinical Impression(s) / ED Diagnoses Final diagnoses:  Generalized abdominal pain     @PCDICTATION @    Simisola Sandles, Wyn Forster, MD 04/10/23 [phone]

## 2023-04-10 NOTE — ED Notes (Signed)
Reviewed discharge instructions with patient, expresses understanding, denies further questions at this time.

## 2023-04-15 ENCOUNTER — Telehealth: Payer: Self-pay

## 2023-04-15 NOTE — Telephone Encounter (Signed)
Transition Care Management Unsuccessful Follow-up Telephone Call  Date of discharge and from where:  04/10/2023 [facility]  Attempts:  1st Attempt  Reason for unsuccessful TCM follow-up call:  Left voice message  Benoit Meech [facility]  [facility] Population Health Community Resource Care Guide   [email or web address]  ?? [phone]   Website: [email or web address]  [email or web address]

## 2023-04-15 NOTE — Telephone Encounter (Signed)
Transition Care Management Unsuccessful Follow-up Telephone Call  Date of discharge and from where:  04/10/2023 [facility]  Attempts:  2nd Attempt  Reason for unsuccessful TCM follow-up call:  Left voice message   [facility]  [facility] Population Health Community Resource Care Guide   [email or web address]  ?? [phone]   Website: [email or web address]  [email or web address]

## 2023-04-20 NOTE — Progress Notes (Unsigned)
ER and 3 MONTH FOLLOW UP  Assessment and Plan:    Essential hypertension Continue current medications Monitor blood pressure at home; call if consistently over 130/80 Continue DASH diet.   Reminder to go to the ER if any CP, SOB, nausea, dizziness, severe HA, changes vision/speech, left arm numbness and tingling and jaw pain. -     CBC with Differential/Platelet -     COMPLETE METABOLIC PANEL WITH GFR -     Magnesium  Hyperlipidemia associated with type 2 diabetes mellitus (HCC) Continue medications: Pravastatin 40mg  daily Discussed dietary and exercise modifications Low fat diet LDL goal <70 -     Lipid panel  Thoracic aortic atherosclerosis (HCC) - per CT 2018 Control blood pressure, lipids and glucose Disscused lifestyle modifications, diet & exercise Continue to monitor  Type 2 diabetes mellitus with stage 3b chronic kidney disease, without long-term current use of insulin (HCC) Continue glimepiride - 4 mg BID Lack of benefit with trulicity, will switch back to ozempic and start financial assistance paperwork Off of metformin due to advancing CKD Long discussion about dietary choices and physical activity Perform daily foot/skin check, notify office of any concerning changes.  Check A1C Q84m -     Hemoglobin A1c  Diabetic sensory polyneuropathy (HCC) Foot exam utd, no concerns today  Stage 3 chronic kidney disease due to type 2 diabetes mellitus (HCC) Increase fluids, avoid NSAIDS, monitor sugars, will monitor  BPH with obstruction/lower urinary tract symptoms Doing well at this time Continue medications: Doxazosin 4mg   Vitamin D deficiency Continue supplementation to maintain goal of 70-100 Taking Vitamin D 1,000 IU daily Vitamin D level annually and PRN  Class 2 severe obesity due to excess calories with serious comorbidity and body mass index (BMI) of 37.0 to 37.9 in adult [facility])  Discussed dietary and exercise modifications Patient will work on increasing  vegetables and increase length and frequency of exercise Will follow up in 3 months  Medication management Magnesium       Continue diet and meds as discussed. Further disposition pending results of labs. Discussed med's effects and SE's.   Over 30 minutes of exam, counseling, chart review, and critical decision making was performed.   Future Appointments  Date Time Provider Department Center  04/21/2023 11:30 AM Raynelle Dick, NP [facility] None  08/11/2023  9:00 AM Raynelle Dick, NP [facility] None  11/11/2023  9:30 AM Adela Glimpse, NP [facility] None   During the course of the visit the patient was educated and counseled about appropriate screening and preventive services including:   Pneumococcal vaccine  Influenza vaccine Td vaccine  Screening electrocardiogram Colorectal cancer screening Diabetes screening Glaucoma screening Nutrition counseling    -------------------------------------------------------------------------------------------------------------------------  HPI 88 y.o. male  presents for ER and follow up on poorly controlled T2DM. He has History of colonic polyps; Essential hypertension; Poorly controlled type 2 diabetes mellitus with renal complication (HCC); Hyperlipidemia associated with type 2 diabetes mellitus (HCC); Vitamin D deficiency; Stage 3 chronic kidney disease due to type 2 diabetes mellitus (HCC); Medication management; Diabetic sensory polyneuropathy (HCC); Bilateral chronic knee pain; Aortic Atherosclerosis (HCC) by Abd CT scan 2018; Gluttony; and Class 2 severe obesity due to excess calories with serious comorbidity and body mass index (BMI) of 38.0 to 38.9 in adult [facility]) on their problem list.  This patient presents to the ED for concern of abdominal pain, constipation, this involves an extensive number of treatment options, and is a complaint that carries with it a high risk of  complications and morbidity.  The differential diagnosis  includes diverticulitis, epiploic appendagitis, colitis, gastroenteritis, constipation, nephrolithiasis, inflammatory bowel disease,  Patient seen emerged part for evaluation of abdominal pain and constipation. Physical exam is largely unremarkable. Laboratory evaluation with a BUN of 34, creatinine 1.95 which is this patient's baseline. Urinalysis is unremarkable. CT abdomen pelvis with some nonspecific perinephric stranding bilaterally but no evidence of significant stool burden. Patient is having multiple watery bowel movements while here in the emergency department likely secondary to the patient's magnesium citrate administration prior to arrival. He remains asymptomatic after 3 and half hours here in the emergency department and at this time does not meet inpatient criteria for admission. With negative workup, he is safe for discharge with outpatient follow-up. Patient discharged.    Pt continues to have knee pain bilaterally. He is not on current medication for pain. Has seen ortho and advised "bone on bone" several years but declined surgery, he is still able to do ADLs.   BMI is There is no height or weight on file to calculate BMI., he has not been working on diet, no exercise due to knee pain. He gained weight and wife has put him back on diet, she fixes his plate.  Wt Readings from Last 3 Encounters:  04/09/23 250 lb (113.4 kg)  01/22/23 251 lb (113.9 kg)  10/21/22 252 lb 9.6 oz (114.6 kg)   He does not check his BP at home, today their BP is    BP Readings from Last 3 Encounters:  04/10/23 138/61  01/22/23 138/70  10/21/22 138/78     He does workout. He denies chest pain, shortness of breath, dizziness.   He has aortic atherosclerosis per CT 2018.    He is on cholesterol medication (pravastatin 40 mg daily) and denies myalgias. His cholesterol is at goal. The cholesterol last visit was:   Lab Results  Component Value Date   CHOL 156 01/22/2023   HDL 41 01/22/2023   LDLCALC 78  01/22/2023   TRIG 283 (H) 01/22/2023   CHOLHDL 3.8 01/22/2023    He has not been working on diet and exercise for DMII and has CKD stage 3 P neuropathy, Bilateral lower extremities Hyperlipidemia on pravastatin On ASA, ACEi Off metformin due to GFR <45 Trulicity 1.5 mg weekly, he feels this hasn't helped as much as ozempic but insurance wouldn't cover  Glimepride 4mg  in AM and BP, half tablt if evning glucose <150. Meter: Freestyle Checks daily, reports fasting has been persistently 200+, worse since switching to trulicity Denies hyperglycemia, hypoglycemia , increased appetite, nausea, polydipsia and polyuria.  Last A1C in the office wasl:  Lab Results  Component Value Date   HGBA1C 9.4 (H) 01/22/2023    He has CKD IIIb associated with T2DM monitored via this office, no microalbuminuria:  Lab Results  Component Value Date   EGFR 34 (L) 01/22/2023   EGFR 32 (L) 10/21/2022   EGFR 37 (L) 07/09/2022   Patient is on Vitamin D supplement and at goal:  Lab Results  Component Value Date   VD25OH 56 01/22/2023     Current Medications:  Current Outpatient Medications on File Prior to Visit  Medication Sig   albuterol (VENTOLIN HFA) 108 (90 Base) MCG/ACT inhaler Inhale 2 puffs into the lungs every 6 (six) hours as needed for wheezing or shortness of breath.   aspirin EC 81 MG tablet Take 81 mg by mouth at bedtime.    Blood Glucose Monitoring Suppl (ONE TOUCH ULTRA 2)  w/Device KIT check blood sugar 1 time daily-E11.21   Cholecalciferol (VITAMIN D-3) 25 MCG (1000 UT) CAPS Take 1,000 Units by mouth at bedtime.   Cinnamon 500 MG TABS Take 1,000 mg by mouth daily.    doxazosin (CARDURA) 4 MG tablet Take  1 tablet  at Bedtime  for BP & Prostate   famotidine (PEPCID) 40 MG tablet Take 1 tablet Daily for Indigestion & Heartburn   fluticasone-salmeterol (ADVAIR DISKUS) 250-50 MCG/ACT AEPB Inhale 1 puff into the lungs in the morning and at bedtime.   furosemide (LASIX) 40 MG tablet Take  1  tablet  Daily for BP & Fluid Retention /Ankle Swelling                                    /                         TAKE                               BY                                MOUTH   glimepiride (AMARYL) 4 MG tablet Take 1/2 to 1 tablet  2 x /day with Meals for Diabetes                                                                                                    TAKE 1/2 TO 1 TABLET BY MOUTH TWICE DAILY WITH MEALS FOR DIABETES   glucose blood (ONETOUCH ULTRA) test strip USE TO CHECK BLOOD SUGAR ONCE DAILY.   Lancets (ONETOUCH ULTRASOFT) lancets Check blood sugar 1 time daily-E11.21   Multiple Vitamins-Minerals (ONE-A-DAY MENS 50+ ADVANTAGE) TABS Take 1 tablet by mouth daily with breakfast.   olmesartan (BENICAR) 40 MG tablet Take  1 tablet  Daily  for BP & Diabetic Kidney Protection   Omega-3 Fatty Acids (FISH OIL) 1200 MG CAPS Take 1,200 mg by mouth daily.   pravastatin (PRAVACHOL) 40 MG tablet Take  1 tablet at Bedtime tor Cholesterol                                                /                                      TAKE  BY                                                 MOUTH   Semaglutide, 2 MG/DOSE, (OZEMPIC, 2 MG/DOSE,) 8 MG/3ML SOPN Inject 2mg  into the skin once weekly for Diabetes   No current facility-administered medications on file prior to visit.    Medical History:  Past Medical History:  Diagnosis Date   AKI (acute kidney injury) (HCC) 04/20/2020   Arthritis    BPH (benign prostatic hyperplasia)    Cataract    CKD (chronic kidney disease), stage III (HCC)    Depression    "usually a happy go lucky"   Diabetes mellitus    Diverticulosis    GERD (gastroesophageal reflux disease)    sometimes will take omeprazole   Hyperlipidemia    Hypertension    Sepsis secondary to UTI (HCC) 05/04/2020   Allergies Allergies  Allergen Reactions   Tramadol Other (See Comments)    "Made his head feel funny"     SURGICAL HISTORY He  has a past surgical history that includes Tonsillectomy and adenoidectomy; Vein ligation and stripping; Knee arthroscopy; Colonoscopy (2005, 08/21/11); Fracture surgery; Cholecystectomy (N/A, 11/29/2015); and Cataract extraction, bilateral (Bilateral, 2020). FAMILY HISTORY His family history includes Diabetes in his brother; Prostate cancer in his brother. SOCIAL HISTORY He  reports that he has quit smoking. His smoking use included cigars. He has never used smokeless tobacco. He reports current alcohol use. He reports that he does not use drugs.  Immunization History  Administered Date(s) Administered   DT (Pediatric) 08/31/2015   Influenza Whole 05/24/2011, 08/06/2012, 07/07/2013   Influenza, High Dose Seasonal PF 06/21/2014, 08/31/2015, 06/03/2016, 07/15/2017, 07/29/2018, 07/04/2019, 07/02/2020, 07/09/2021, 07/09/2022   PFIZER(Purple Top)SARS-COV-2 Vaccination 10/15/2019, 11/06/2019, 08/30/2020   PNEUMOCOCCAL CONJUGATE-20 07/09/2021   Pneumococcal Conjugate-13 06/21/2014   Pneumococcal Polysaccharide-23 03/10/2005   Health Maintenance  Topic Date Due   Zoster Vaccines- Shingrix (1 of 2) Never done   OPHTHALMOLOGY EXAM  11/09/2019   COVID-19 Vaccine (4 - 2023-24 season) 05/23/2022   FOOT EXAM  10/08/2022   INFLUENZA VACCINE  04/23/2023   HEMOGLOBIN A1C  07/25/2023   Medicare Annual Wellness (AWV)  10/22/2023   DTaP/Tdap/Td (2 - Tdap) 08/30/2025   Pneumonia Vaccine 82+ Years old  Completed   HPV VACCINES  Aged Out   Colonoscopy  Discontinued    Last colonoscopy: 2012 was due 2015 but declined another, also declines hemoccult  Eye exam: Dr. Harriette Bouillon, ? In 2021, OVERDUE, reminded to schedule Dentist: 2022 has full upper partials.   Patient Care Team: Lucky Cowboy, MD as PCP - General (Internal Medicine) August Saucer, Corrie Mckusick, MD as Consulting Physician (Orthopedic Surgery) Charlett Nose, [facility] (Inactive) as Pharmacist (Pharmacist)   MEDICARE  WELLNESS OBJECTIVES: Physical activity:   Cardiac risk factors:   Depression/mood screen:      01/21/2023    7:42 PM  Depression screen PHQ 2/9  Decreased Interest 0  Down, Depressed, Hopeless 0  PHQ - 2 Score 0    ADLs:     01/21/2023    7:42 PM 10/21/2022   10:12 AM  In your present state of health, do you have any difficulty performing the following activities:  Hearing? 0 0  Vision? 0 0  Comment  Wears Rx glasses, had cataract surgery  Difficulty concentrating or making decisions? 0 0  Walking or climbing stairs? 0 0  Dressing or bathing? 0 0  Doing errands, shopping? 0 0  Preparing Food and eating ?  N  In the past six months, have you accidently leaked urine?  N  Do you have problems with loss of bowel control?  N  Managing your Medications?  N  Managing your Finances?  N  Housekeeping or managing your Housekeeping?  N     Cognitive Testing  Alert? Yes  Normal Appearance?Yes  Oriented to person? Yes  Place? Yes   Time? Yes  Recall of three objects?  Yes  Can perform simple calculations? Yes  Displays appropriate judgment?Yes  Can read the correct time from a watch face?Yes  EOL planning:        Review of Systems  Constitutional:  Negative for chills and fever.  HENT:  Negative for congestion, hearing loss, sinus pain, sore throat and tinnitus.   Eyes:  Negative for blurred vision and double vision.  Respiratory:  Positive for shortness of breath (on exertion, chronic unchanged). Negative for cough, hemoptysis, sputum production and wheezing.   Cardiovascular:  Negative for chest pain, palpitations and leg swelling.  Gastrointestinal:  Negative for abdominal pain, constipation, diarrhea, heartburn, nausea and vomiting.  Genitourinary:  Negative for dysuria and urgency.       Gets up 2 times at night  Musculoskeletal:  Positive for joint pain (knees). Negative for back pain, falls, myalgias and neck pain.  Skin:  Negative for rash.  Neurological:  Negative for  dizziness, tingling, tremors, weakness and headaches.  Endo/Heme/Allergies:  Does not bruise/bleed easily.  Psychiatric/Behavioral:  Negative for depression and suicidal ideas. The patient is not nervous/anxious and does not have insomnia.       Physical Exam: There were no vitals taken for this visit. Wt Readings from Last 3 Encounters:  04/09/23 250 lb (113.4 kg)  01/22/23 251 lb (113.9 kg)  10/21/22 252 lb 9.6 oz (114.6 kg)   General Appearance: Well nourished, obese, in no apparent distress. Eyes: PERRLA, EOMs, conjunctiva no swelling or erythema Sinuses: No Frontal/maxillary tenderness ENT/Mouth: Ext aud canals clear, L TMs without erythema, bulging, R TM with extensive scarring, erythematous around bony structures (per the patient he has chronic drainage issue that has been cleared by ENT). No erythema, swelling, or exudate on post pharynx.  Tonsils not swollen or erythematous. Hearing normal.  Neck: Supple, thyroid normal.  Respiratory: Respiratory effort normal, BS equal bilaterally without rales, rhonchi, wheezing or stridor.  Cardio: RRR with no MRGs. 1+ peripheral pulses equally with 1+ non-pitting edema bilateral ankles.  Abdomen: Soft, rounded obese abdomen + BS.  Non tender, no guarding, rebound, palpable hernias or masses. Lymphatics: Non tender without lymphadenopathy.  Musculoskeletal: BIl knees with bony enlargement, crepitus, no effusion, 5/5 strength, antalgic gait Skin: Warm, dry without rashes, lesions, ecchymosis. Flaking skin of feet bilaterally, thickened first digit toe nails.  Neuro: Cranial nerves intact. No cerebellar symptoms. Sensation in feel intact bilaterally to monofilament testing.  Psych: Awake and oriented X 3, normal affect, Insight and Judgment appropriate.     Medicare Attestation I have personally reviewed: The patient's medical and social history Their use of alcohol, tobacco or illicit drugs Their current medications and supplements The  patient's functional ability including ADLs,fall risks, home safety risks, cognitive, and hearing and visual impairment Diet and physical activities Evidence for depression or mood disorders  The patient's weight, height, BMI, and visual acuity have been recorded in the chart.  I have  made referrals, counseling, and provided education to the patient based on review of the above and I have provided the patient with a written personalized care plan for preventive services.      Raynelle Dick, NP 1:09 PM [facility]

## 2023-04-21 ENCOUNTER — Inpatient Hospital Stay: Payer: Medicare Other | Admitting: Nurse Practitioner

## 2023-04-21 ENCOUNTER — Encounter: Payer: Self-pay | Admitting: Nurse Practitioner

## 2023-04-21 VITALS — BP 132/70 | HR 93 | Temp 97.7°F | Ht 69.0 in | Wt 244.2 lb

## 2023-04-21 DIAGNOSIS — E1122 Type 2 diabetes mellitus with diabetic chronic kidney disease: Secondary | ICD-10-CM

## 2023-04-21 DIAGNOSIS — N138 Other obstructive and reflux uropathy: Secondary | ICD-10-CM

## 2023-04-21 DIAGNOSIS — E559 Vitamin D deficiency, unspecified: Secondary | ICD-10-CM

## 2023-04-21 DIAGNOSIS — E1169 Type 2 diabetes mellitus with other specified complication: Secondary | ICD-10-CM

## 2023-04-21 DIAGNOSIS — K5792 Diverticulitis of intestine, part unspecified, without perforation or abscess without bleeding: Secondary | ICD-10-CM

## 2023-04-21 DIAGNOSIS — N401 Enlarged prostate with lower urinary tract symptoms: Secondary | ICD-10-CM

## 2023-04-21 DIAGNOSIS — E785 Hyperlipidemia, unspecified: Secondary | ICD-10-CM

## 2023-04-21 DIAGNOSIS — E1142 Type 2 diabetes mellitus with diabetic polyneuropathy: Secondary | ICD-10-CM

## 2023-04-21 DIAGNOSIS — I700 Atherosclerosis of aorta: Secondary | ICD-10-CM | POA: Diagnosis not present

## 2023-04-21 DIAGNOSIS — N183 Chronic kidney disease, stage 3 unspecified: Secondary | ICD-10-CM

## 2023-04-21 DIAGNOSIS — I10 Essential (primary) hypertension: Secondary | ICD-10-CM

## 2023-04-21 DIAGNOSIS — Z6838 Body mass index (BMI) 38.0-38.9, adult: Secondary | ICD-10-CM

## 2023-04-21 DIAGNOSIS — N1832 Chronic kidney disease, stage 3b: Secondary | ICD-10-CM

## 2023-04-21 DIAGNOSIS — Z79899 Other long term (current) drug therapy: Secondary | ICD-10-CM

## 2023-04-21 MED ORDER — CIPROFLOXACIN HCL 250 MG PO TABS
250.0000 mg | ORAL_TABLET | Freq: Two times a day (BID) | ORAL | 0 refills | Status: AC
Start: 2023-04-21 — End: 2023-04-26

## 2023-04-21 NOTE — Patient Instructions (Signed)
Diverticulitis  Diverticulitis happens when poop (stool) and bacteria get trapped in small pouches in the colon called diverticula. These pouches may form if you have a condition called diverticulosis. When the poop and bacteria get trapped, it can cause an infection and inflammation. Diverticulitis may cause severe stomach pain and diarrhea. It can also lead to tissue damage in your colon. This can cause bleeding or blockage. In some cases, the diverticula may burst (rupture). This can cause infected poop to go into other parts of your abdomen. What are the causes? This condition is caused by poop getting trapped in the diverticula. This allows bacteria to grow. It can lead to inflammation and infection. What increases the risk? You are more likely to get this condition if you have diverticulosis. You are also more at risk if: You are overweight or obese. You do not get enough exercise. You drink alcohol. You smoke. You eat a lot of red meat, such as beef, pork, or lamb. You do not get enough fiber. Foods high in fiber include fruits, vegetables, beans, nuts, and whole grains. You are over 35 years of age. What are the signs or symptoms? Symptoms of this condition may include: Pain and tenderness in the abdomen. This pain is often felt on the left side but may occur in other spots. Fever and chills. Nausea and vomiting. Cramping. Bloating. Changes in how often you poop. Blood in your poop. How is this diagnosed? This condition is diagnosed based on your medical history and a physical exam. You may also have tests done to make sure there is nothing else causing your condition. These tests may include: Blood tests. Tests done on your pee (urine). A CT scan of the abdomen. You may need to have a colonoscopy. This is an exam to look at your whole large intestine. During the exam, a tube is put into the opening of your butt (anus) and then moved into your rectum, colon, and other parts of  the large intestine. This exam is done to look at the diverticula. It can also see if there is something else that may be causing your symptoms. How is this treated? Most cases are mild and can be treated at home. You may be told to: Take over-the-counter pain medicine. Only eat and drink clear liquids. Take antibiotics. Rest. More severe cases may need to be treated at a hospital. Treatment may include: Not eating or drinking. Taking pain medicines. Getting antibiotics through an IV. Getting fluids and nutrition through an IV. Surgery. Follow these instructions at home: Medicines Take over-the-counter and prescription medicines only as told by your health care provider. These include fiber supplements, probiotics, and medicines to soften your poop (stool softeners). If you were prescribed antibiotics, take them as told by your provider. Do not stop using the antibiotic even if you start to feel better. Ask your provider if the medicine prescribed to you requires you to avoid driving or using machinery. Eating and drinking  Follow the diet told by your provider. You may need to only eat and drink liquids. After your symptoms get better, you may be able to return to a more normal diet. You may be told to eat at least 25 grams (25 g) of fiber each day. Fiber makes it easier to poop. Healthy sources of fiber include: Berries. One cup has 4-8 g of fiber. Beans or lentils. One-half cup has 5-8 g of fiber. Green vegetables. One cup has 4 g of fiber. Avoid eating red meat.  General instructions Do not use any products that contain nicotine or tobacco. These products include cigarettes, chewing tobacco, and vaping devices, such as e-cigarettes. If you need help quitting, ask your provider. Exercise for at least 30 minutes, 3 times a week. Exercise hard enough to raise your heart rate and break a sweat. Contact a health care provider if: Your pain gets worse. Your pooping does not go back to  normal. Your symptoms do not get better with treatment. Your symptoms get worse all of a sudden. You have a fever. You vomit more than one time. Your poop is bloody, black, or tarry. This information is not intended to replace advice given to you by your health care provider. Make sure you discuss any questions you have with your health care provider. Document Revised: 06/05/2022 Document Reviewed: 06/05/2022 Elsevier Patient Education  2024 [employer].

## 2023-05-04 ENCOUNTER — Ambulatory Visit: Payer: Medicare Other | Admitting: Nurse Practitioner

## 2023-06-16 ENCOUNTER — Other Ambulatory Visit: Payer: Self-pay | Admitting: Internal Medicine

## 2023-07-03 DIAGNOSIS — L309 Dermatitis, unspecified: Secondary | ICD-10-CM | POA: Diagnosis not present

## 2023-07-10 ENCOUNTER — Encounter: Payer: Medicare Other | Admitting: Nurse Practitioner

## 2023-08-10 NOTE — Progress Notes (Unsigned)
COMPLETE PHYSICAL  Assessment and Plan:   Matin was seen today for annual exam.  Diagnoses and all orders for this visit:  Encounter for annual physical exam Yearly  Essential hypertension Continue current medications: Olmesartan 40mg , Lasix 40 mg qd and cardura 4mg  Monitor blood pressure at home; call if consistently over 130/80 Continue DASH diet.   Reminder to go to the ER if any CP, SOB, nausea, dizziness, severe HA, changes vision/speech, left arm numbness and tingling and jaw pain. -     CBC with Differential/Platelet -     COMPLETE METABOLIC PANEL WITH GFR -     Magnesium  Hyperlipidemia associated with type 2 diabetes mellitus (HCC) Continue medications:Pravastatin 40mg  daily Discussed dietary and exercise modifications Low fat diet LDL goal <70 -     Lipid panel  Thoracic aortic atherosclerosis (HCC) Control blood pressure, lipids and glucose Disscused lifestyle modifications, diet & exercise Continue to monitor  Type 2 diabetes mellitus with stage 3b chronic kidney disease, without long-term current use of insulin (HCC) Continue glimepiride - 4 mg QAM Ozempic 2 mg SQ QW  Off of metformin due to advancing CKD Long discussion about dietary choices and physical activity Perform daily foot/skin check, notify office of any concerning changes.  Check A1C Q62m -     Hemoglobin A1c  Edema Strongly encouraged to take Furosemide daily Weight self daily and if he gains more than 5 pounds notify the office Encouraged to wear compression socks  Diabetic sensory polyneuropathy (HCC) Foot exam completed today. Sensation in tact  Stage 3 chronic kidney disease due to type 2 diabetes mellitus (HCC) Increase fluids  Avoid NSAIDS Blood pressure control Monitor sugars  Will continue to monitor - CBC - CMP  BPH with obstruction/lower urinary tract symptoms Doing well at this time Continue medications: Doxazosin 4mg  Will continue to monitor PSA   Vitamin D  deficiency Continue supplementation to maintain goal of 70-100 Taking Vitamin D 1,000 IU daily Vitamin D level  Type 2 diabetes mellitus with morbid obesity(HCC) Discussed dietary and exercise modifications Patient will work on increasing vegetables and increase length and frequency of exercise Decrease saturated fats and simple carbs Will follow up in 3 months  Screening for Hematuria/proteinuria  Microalbumin/creatinine urine ratio  Urine routine with reflex microscopic  Screening for ischemic heart disease  EKG  Need for influenza vaccination -     Flu vaccine HIGH DOSE PF Discussed vaccination and side effcts  Medication management -     CBC with Differential/Platelet -     COMPLETE METABOLIC PANEL WITH GFR -     Magnesium -     Lipid panel -     TSH -     Hemoglobin A1C w/out eAG -     VITAMIN D 25 Hydroxy (Vit-D Deficiency, Fractures) -     EKG 12-Lead -     Urinalysis, Routine w reflex microscopic -     Microalbumin / creatinine urine ratio -     PSA         Continue diet and meds as discussed. Further disposition pending results of labs. Discussed med's effects and SE's.   Over 30 minutes of exam, counseling, chart review, and critical decision making was performed.   Future Appointments  Date Time Provider Department Center  11/11/2023  9:30 AM Adela Glimpse, NP [facility] None  08/11/2024  9:00 AM Raynelle Dick, NP [facility] None    -------------------------------------------------------------------------------------------------------------------------  HPI 88 y.o. male  presents for 6 week follow  up on HTN, poorly controlled DMII with CKD and neuropathy, morbid obesity.    Pt continues to have knee pain bilaterally. He is not on current medication for pain.    BMI is Body mass index is 36.51 kg/m., he has not been working on diet, no exercise due to knee pain. He is only able to do minimal activity Wt Readings from Last 3 Encounters:   08/11/23 247 lb 3.2 oz (112.1 kg)  04/21/23 244 lb 3.2 oz (110.8 kg)  04/09/23 250 lb (113.4 kg)   He does not check his BP at home, today their BP is BP: 130/62 He has not been taking his Furosemide daily and is retaining fluid in his lower legs. Olmesartan 40 mg every day  BP Readings from Last 3 Encounters:  08/11/23 130/62  04/21/23 132/70  04/10/23 138/61   He does workout. He denies chest pain, shortness of breath, dizziness.    He is on cholesterol medication (pravastatin 40 mg daily) and denies myalgias. His cholesterol is not at goal. The cholesterol last visit was:   Lab Results  Component Value Date   CHOL 156 01/22/2023   HDL 41 01/22/2023   LDLCALC 78 01/22/2023   TRIG 283 (H) 01/22/2023   CHOLHDL 3.8 01/22/2023    He has not been working on diet and exercise for DMII and has CKD stage 3 P neuropathy, Bilateral lower extremities Hyperlipidemia on pravastatin On ASA, ACEi Off metformin due to GFR <45 Glimepiride 4 mg QAM Semaglutide 2 mg SQ QW Meter: Freestyle Checks daily- running in low 130-160  Denies hyperglycemia, hypoglycemia , increased appetite, nausea, polydipsia and polyuria.  Last A1C in the office wasl:  Lab Results  Component Value Date   HGBA1C 9.4 (H) 01/22/2023    He has CKD III associated with T2DM monitored via this office: He has been drinking 8 cups of decaf coffee a day.  He is also drinking water Lab Results  Component Value Date   GFRNONAA 67 (L) 04/09/2023   Patient is on Vitamin D supplement and at goal:  Lab Results  Component Value Date   VD25OH 56 01/22/2023        Current Medications:  Current Outpatient Medications on File Prior to Visit  Medication Sig   albuterol (VENTOLIN HFA) 108 (90 Base) MCG/ACT inhaler Inhale 2 puffs into the lungs every 6 (six) hours as needed for wheezing or shortness of breath.   aspirin EC 81 MG tablet Take 81 mg by mouth at bedtime.    Blood Glucose Monitoring Suppl (ONE TOUCH ULTRA 2)  w/Device KIT check blood sugar 1 time daily-E11.21   Cholecalciferol (VITAMIN D-3) 25 MCG (1000 UT) CAPS Take 1,000 Units by mouth at bedtime.   Cinnamon 500 MG TABS Take 1,000 mg by mouth daily.    doxazosin (CARDURA) 4 MG tablet Take  1 tablet  at Bedtime  for BP & Prostate   famotidine (PEPCID) 40 MG tablet Take 1 tablet Daily for Indigestion & Heartburn   furosemide (LASIX) 40 MG tablet Take  1 tablet  Daily for BP & Fluid Retention /Ankle Swelling                                    /                         TAKE  BY                                MOUTH   glimepiride (AMARYL) 4 MG tablet Take 1/2 to 1 tablet  2 x /day with Meals for Diabetes                                                                                                    TAKE 1/2 TO 1 TABLET BY MOUTH TWICE DAILY WITH MEALS FOR DIABETES   Lancets (ONETOUCH ULTRASOFT) lancets Check blood sugar 1 time daily-E11.21   magnesium oxide (MAG-OX) 400 MG tablet Take by mouth.   Multiple Vitamins-Minerals (ONE-A-DAY MENS 50+ ADVANTAGE) TABS Take 1 tablet by mouth daily with breakfast.   Omega-3 Fatty Acids (FISH OIL) 1200 MG CAPS Take 1,200 mg by mouth daily.   ONETOUCH ULTRA test strip USE TO CHECK BLOOD SUGAR ONCE DAILY   pravastatin (PRAVACHOL) 40 MG tablet Take  1 tablet at Bedtime tor Cholesterol                                                /                                      TAKE                                                          BY                                                 MOUTH   Semaglutide, 2 MG/DOSE, (OZEMPIC, 2 MG/DOSE,) 8 MG/3ML SOPN Inject 2mg  into the skin once weekly for Diabetes   fluticasone-salmeterol (ADVAIR DISKUS) 250-50 MCG/ACT AEPB Inhale 1 puff into the lungs in the morning and at bedtime. (Patient not taking: Reported on 04/21/2023)   olmesartan (BENICAR) 40 MG tablet Take  1 tablet  Daily  for BP & Diabetic Kidney Protection   No current facility-administered  medications on file prior to visit.    Medical History:  Past Medical History:  Diagnosis Date   AKI (acute kidney injury) (HCC) 04/20/2020   Arthritis    BPH (benign prostatic hyperplasia)    Cataract    CKD (chronic kidney disease), stage III (HCC)    Depression    "usually a happy go lucky"   Diabetes mellitus    Diverticulosis    GERD (gastroesophageal reflux disease)    sometimes will take omeprazole  Hyperlipidemia    Hypertension    Sepsis secondary to UTI (HCC) 05/04/2020   Allergies Allergies  Allergen Reactions   Tramadol Other (See Comments)    "Made his head feel funny"    SURGICAL HISTORY He  has a past surgical history that includes Tonsillectomy and adenoidectomy; Vein ligation and stripping; Knee arthroscopy; Colonoscopy (2005, 08/21/11); Fracture surgery; Cholecystectomy (N/A, 11/29/2015); and Cataract extraction, bilateral (Bilateral, 2020). FAMILY HISTORY His family history includes Diabetes in his brother; Prostate cancer in his brother. SOCIAL HISTORY He  reports that he has quit smoking. His smoking use included cigars. He has never used smokeless tobacco. He reports current alcohol use. He reports that he does not use drugs.  Immunization History  Administered Date(s) Administered   DT (Pediatric) 08/31/2015   Influenza Whole 05/24/2011, 08/06/2012, 07/07/2013   Influenza, High Dose Seasonal PF 06/21/2014, 08/31/2015, 06/03/2016, 07/15/2017, 07/29/2018, 07/04/2019, 07/02/2020, 07/09/2021, 07/09/2022   PFIZER(Purple Top)SARS-COV-2 Vaccination 10/15/2019, 11/06/2019, 08/30/2020   PNEUMOCOCCAL CONJUGATE-20 07/09/2021   Pneumococcal Conjugate-13 06/21/2014   Pneumococcal Polysaccharide-23 03/10/2005   Health Maintenance  Topic Date Due   Zoster Vaccines- Shingrix (1 of 2) Never done   OPHTHALMOLOGY EXAM  11/09/2019   FOOT EXAM  10/08/2022   INFLUENZA VACCINE  04/23/2023   COVID-19 Vaccine (4 - 2023-24 season) 05/24/2023   HEMOGLOBIN A1C  07/25/2023    Medicare Annual Wellness (AWV)  10/22/2023   DTaP/Tdap/Td (2 - Tdap) 08/30/2025   Pneumonia Vaccine 61+ Years old  Completed   HPV VACCINES  Aged Out   Colonoscopy  Discontinued    Eye exam:2022 Dentist: 2022 has full upper partials.   Patient Care Team: Lucky Cowboy, MD as PCP - General (Internal Medicine) August Saucer Corrie Mckusick, MD as Consulting Physician (Orthopedic Surgery) Charlett Nose, [facility] (Inactive) as Pharmacist (Pharmacist)    Review of Systems  Constitutional:  Negative for chills and fever.  HENT:  Negative for congestion, hearing loss, sinus pain, sore throat and tinnitus.   Eyes:  Negative for blurred vision and double vision.  Respiratory:  Positive for shortness of breath (on exertion). Negative for cough, hemoptysis, sputum production and wheezing.   Cardiovascular:  Negative for chest pain, palpitations and leg swelling.  Gastrointestinal:  Negative for abdominal pain, constipation, diarrhea, heartburn, nausea and vomiting.  Genitourinary:  Negative for dysuria and urgency.       Gets up 1-3 times at night  Musculoskeletal:  Positive for joint pain (knees and hips). Negative for back pain, falls, myalgias and neck pain.  Skin:  Negative for rash.  Neurological:  Negative for dizziness, tingling, tremors, weakness and headaches.  Endo/Heme/Allergies:  Bruises/bleeds easily.  Psychiatric/Behavioral:  Negative for depression and suicidal ideas. The patient is not nervous/anxious and does not have insomnia.       Physical Exam: BP 130/62   Pulse 85   Temp 97.9 F (36.6 C)   Ht 5\' 9"  (1.753 m)   Wt 247 lb 3.2 oz (112.1 kg)   SpO2 94%   BMI 36.51 kg/m  Wt Readings from Last 3 Encounters:  08/11/23 247 lb 3.2 oz (112.1 kg)  04/21/23 244 lb 3.2 oz (110.8 kg)  04/09/23 250 lb (113.4 kg)   General Appearance: Well nourished, obese, in no apparent distress. Eyes: PERRLA, EOMs, conjunctiva no swelling or erythema Sinuses: No Frontal/maxillary  tenderness ENT/Mouth: Ext aud canals clear, L TMs without erythema, bulging, R TM with extensive scarring, erythematous around bony structures (per the patient he has chronic drainage issue that has been cleared  by ENT). No erythema, swelling, or exudate on post pharynx.  Tonsils not swollen or erythematous. Hearing normal.  Neck: Supple, thyroid normal.  Respiratory: Respiratory effort normal, BS equal bilaterally without rales, rhonchi, wheezing or stridor.  Cardio: RRR with no MRGs. 2+ pitting edema of feet bilaterally, pulses diminished due to edema Abdomen: Soft, rounded obese abdomen + BS.  Non tender, no guarding, rebound, palpable hernias or masses. Lymphatics: Non tender without lymphadenopathy.  Musculoskeletal: BIl knees with bony enlargement, crepitus, no effusion, 5/5 strength, antalgic gait Skin: Warm, dry without rashes, lesions, ecchymosis. Flaking skin of feet bilaterally Neuro: Cranial nerves intact. No cerebellar symptoms. Sensation in heels diminished bilaterally to monofilament testing.  Psych: Awake and oriented X 3, normal affect, Insight and Judgment appropriate.    EKG: NSR, AV Block I, no ST changes   [facility]  08/11/2023

## 2023-08-11 ENCOUNTER — Ambulatory Visit (INDEPENDENT_AMBULATORY_CARE_PROVIDER_SITE_OTHER): Payer: Medicare Other | Admitting: Nurse Practitioner

## 2023-08-11 ENCOUNTER — Encounter: Payer: Self-pay | Admitting: Nurse Practitioner

## 2023-08-11 VITALS — BP 130/62 | HR 85 | Temp 97.9°F | Ht 69.0 in | Wt 247.2 lb

## 2023-08-11 DIAGNOSIS — Z79899 Other long term (current) drug therapy: Secondary | ICD-10-CM | POA: Diagnosis not present

## 2023-08-11 DIAGNOSIS — E785 Hyperlipidemia, unspecified: Secondary | ICD-10-CM

## 2023-08-11 DIAGNOSIS — I700 Atherosclerosis of aorta: Secondary | ICD-10-CM

## 2023-08-11 DIAGNOSIS — E1169 Type 2 diabetes mellitus with other specified complication: Secondary | ICD-10-CM

## 2023-08-11 DIAGNOSIS — E559 Vitamin D deficiency, unspecified: Secondary | ICD-10-CM

## 2023-08-11 DIAGNOSIS — I10 Essential (primary) hypertension: Secondary | ICD-10-CM

## 2023-08-11 DIAGNOSIS — N401 Enlarged prostate with lower urinary tract symptoms: Secondary | ICD-10-CM

## 2023-08-11 DIAGNOSIS — Z23 Encounter for immunization: Secondary | ICD-10-CM | POA: Diagnosis not present

## 2023-08-11 DIAGNOSIS — E1122 Type 2 diabetes mellitus with diabetic chronic kidney disease: Secondary | ICD-10-CM

## 2023-08-11 DIAGNOSIS — N183 Type 2 diabetes mellitus with diabetic chronic kidney disease: Secondary | ICD-10-CM

## 2023-08-11 DIAGNOSIS — Z0001 Encounter for general adult medical examination with abnormal findings: Secondary | ICD-10-CM

## 2023-08-11 DIAGNOSIS — E1142 Type 2 diabetes mellitus with diabetic polyneuropathy: Secondary | ICD-10-CM

## 2023-08-11 DIAGNOSIS — Z0000 Encounter for general adult medical examination without abnormal findings: Secondary | ICD-10-CM

## 2023-08-11 DIAGNOSIS — Z136 Encounter for screening for cardiovascular disorders: Secondary | ICD-10-CM | POA: Diagnosis not present

## 2023-08-11 DIAGNOSIS — N1832 Chronic kidney disease, stage 3b: Secondary | ICD-10-CM | POA: Diagnosis not present

## 2023-08-11 DIAGNOSIS — Z1329 Encounter for screening for other suspected endocrine disorder: Secondary | ICD-10-CM

## 2023-08-11 NOTE — Patient Instructions (Signed)

## 2023-08-12 LAB — COMPLETE METABOLIC PANEL WITH GFR
AG Ratio: 1.7 (calc) (ref 1.0–2.5)
ALT: 22 U/L (ref 9–46)
AST: 17 U/L (ref 10–35)
Albumin: 4.2 g/dL (ref 3.6–5.1)
Alkaline phosphatase (APISO): 74 U/L (ref 35–144)
BUN/Creatinine Ratio: 15 (calc) (ref 6–22)
BUN: 34 mg/dL — ABNORMAL HIGH (ref 7–25)
CO2: 31 mmol/L (ref 20–32)
Calcium: 9.5 mg/dL (ref 8.6–10.3)
Chloride: 101 mmol/L (ref 98–110)
Creat: 2.25 mg/dL — ABNORMAL HIGH (ref 0.70–1.22)
Globulin: 2.5 g/dL (ref 1.9–3.7)
Glucose, Bld: 216 mg/dL — ABNORMAL HIGH (ref 65–99)
Potassium: 5.4 mmol/L — ABNORMAL HIGH (ref 3.5–5.3)
Sodium: 140 mmol/L (ref 135–146)
Total Bilirubin: 0.4 mg/dL (ref 0.2–1.2)
Total Protein: 6.7 g/dL (ref 6.1–8.1)
eGFR: 28 mL/min/{1.73_m2} — ABNORMAL LOW (ref >=60)

## 2023-08-12 LAB — URINALYSIS, ROUTINE W REFLEX MICROSCOPIC
Bacteria, UA: NONE SEEN /[HPF]
Bilirubin Urine: NEGATIVE
Hgb urine dipstick: NEGATIVE
Hyaline Cast: NONE SEEN /[LPF]
Ketones, ur: NEGATIVE
Leukocytes,Ua: NEGATIVE
Nitrite: NEGATIVE
RBC / HPF: NONE SEEN /[HPF] (ref 0–2)
Specific Gravity, Urine: 1.02 (ref 1.001–1.035)
Squamous Epithelial / HPF: NONE SEEN /[HPF] (ref <=5)
WBC, UA: NONE SEEN /[HPF] (ref 0–5)
pH: 6.5 (ref 5.0–8.0)

## 2023-08-12 LAB — CBC WITH DIFFERENTIAL/PLATELET
Absolute Lymphocytes: 1886 {cells}/uL (ref 850–3900)
Absolute Monocytes: 861 {cells}/uL (ref 200–950)
Basophils Absolute: 49 {cells}/uL (ref 0–200)
Basophils Relative: 0.6 %
Eosinophils Absolute: 213 {cells}/uL (ref 15–500)
Eosinophils Relative: 2.6 %
HCT: 43.9 % (ref 38.5–50.0)
Hemoglobin: 14.2 g/dL (ref 13.2–17.1)
MCH: 29.4 pg (ref 27.0–33.0)
MCHC: 32.3 g/dL (ref 32.0–36.0)
MCV: 90.9 fL (ref 80.0–100.0)
MPV: 10.9 fL (ref 7.5–12.5)
Monocytes Relative: 10.5 %
Neutro Abs: 5191 {cells}/uL (ref 1500–7800)
Neutrophils Relative %: 63.3 %
Platelets: 190 10*3/uL (ref 140–400)
RBC: 4.83 10*6/uL (ref 4.20–5.80)
RDW: 12.7 % (ref 11.0–15.0)
Total Lymphocyte: 23 %
WBC: 8.2 10*3/uL (ref 3.8–10.8)

## 2023-08-12 LAB — MAGNESIUM: Magnesium: 2.2 mg/dL (ref 1.5–2.5)

## 2023-08-12 LAB — MICROSCOPIC MESSAGE

## 2023-08-12 LAB — LIPID PANEL
Cholesterol: 145 mg/dL (ref <200)
HDL: 44 mg/dL (ref >=40)
LDL Cholesterol (Calc): 73 mg/dL
Non-HDL Cholesterol (Calc): 101 mg/dL (ref <130)
Total CHOL/HDL Ratio: 3.3 (calc) (ref <5.0)
Triglycerides: 188 mg/dL — ABNORMAL HIGH (ref <150)

## 2023-08-12 LAB — HEMOGLOBIN A1C W/OUT EAG: Hgb A1c MFr Bld: 8.8 %{Hb} — ABNORMAL HIGH (ref <5.7)

## 2023-08-12 LAB — TSH: TSH: 2.75 m[IU]/L (ref 0.40–4.50)

## 2023-08-12 LAB — MICROALBUMIN / CREATININE URINE RATIO
Creatinine, Urine: 150 mg/dL (ref 20–320)
Microalb Creat Ratio: 17 mg/g{creat} (ref <30)
Microalb, Ur: 2.6 mg/dL

## 2023-08-12 LAB — VITAMIN D 25 HYDROXY (VIT D DEFICIENCY, FRACTURES): Vit D, 25-Hydroxy: 65 ng/mL (ref 30–100)

## 2023-08-12 LAB — PSA: PSA: 1.65 ng/mL (ref <=4.00)

## 2023-08-14 ENCOUNTER — Other Ambulatory Visit: Payer: Self-pay | Admitting: Internal Medicine

## 2023-08-14 DIAGNOSIS — R609 Edema, unspecified: Secondary | ICD-10-CM

## 2023-08-14 DIAGNOSIS — I10 Essential (primary) hypertension: Secondary | ICD-10-CM

## 2023-08-19 ENCOUNTER — Other Ambulatory Visit: Payer: Self-pay | Admitting: Nurse Practitioner

## 2023-08-19 DIAGNOSIS — E1122 Type 2 diabetes mellitus with diabetic chronic kidney disease: Secondary | ICD-10-CM

## 2023-08-19 MED ORDER — OZEMPIC (2 MG/DOSE) 8 MG/3ML ~~LOC~~ SOPN
PEN_INJECTOR | SUBCUTANEOUS | 3 refills | Status: DC
Start: 2023-08-19 — End: 2023-09-14

## 2023-08-19 MED ORDER — OZEMPIC (2 MG/DOSE) 8 MG/3ML ~~LOC~~ SOPN
PEN_INJECTOR | SUBCUTANEOUS | 3 refills | Status: DC
Start: 2023-08-19 — End: 2023-08-19

## 2023-08-21 ENCOUNTER — Other Ambulatory Visit: Payer: Self-pay | Admitting: Internal Medicine

## 2023-09-03 ENCOUNTER — Telehealth: Payer: Self-pay | Admitting: Pharmacist

## 2023-09-03 NOTE — Progress Notes (Signed)
Pharmacy Quality Measure Review  This patient is appearing on a report for being at risk of failing the adherence measure for diabetes medications this calendar year.   Medication: glimeperide 4 mg  Last fill date: 04/04/2023 for 90 day supply  Left voicemail for patient to return my call at their convenience.  Reynold Bowen, PharmD Clinical Pharmacist [place] Direct Dial: [phone]

## 2023-09-04 ENCOUNTER — Other Ambulatory Visit: Payer: Self-pay | Admitting: Internal Medicine

## 2023-09-14 ENCOUNTER — Other Ambulatory Visit: Payer: Self-pay

## 2023-09-14 DIAGNOSIS — E1122 Type 2 diabetes mellitus with diabetic chronic kidney disease: Secondary | ICD-10-CM

## 2023-09-14 MED ORDER — OZEMPIC (2 MG/DOSE) 8 MG/3ML ~~LOC~~ SOPN
PEN_INJECTOR | SUBCUTANEOUS | 3 refills | Status: DC
Start: 2023-09-14 — End: 2024-05-24

## 2023-09-14 MED ORDER — OZEMPIC (2 MG/DOSE) 8 MG/3ML ~~LOC~~ SOPN
PEN_INJECTOR | SUBCUTANEOUS | 3 refills | Status: DC
Start: 2023-09-14 — End: 2023-09-14

## 2023-09-20 ENCOUNTER — Other Ambulatory Visit: Payer: Self-pay | Admitting: Internal Medicine

## 2023-09-20 DIAGNOSIS — N138 Other obstructive and reflux uropathy: Secondary | ICD-10-CM

## 2023-09-20 DIAGNOSIS — I10 Essential (primary) hypertension: Secondary | ICD-10-CM

## 2023-09-20 MED ORDER — DOXAZOSIN MESYLATE 4 MG PO TABS
ORAL_TABLET | ORAL | 3 refills | Status: DC
Start: 2023-09-20 — End: 2024-01-22

## 2023-10-07 DIAGNOSIS — N1832 Chronic kidney disease, stage 3b: Secondary | ICD-10-CM | POA: Diagnosis not present

## 2023-10-14 DIAGNOSIS — I129 Hypertensive chronic kidney disease with stage 1 through stage 4 chronic kidney disease, or unspecified chronic kidney disease: Secondary | ICD-10-CM | POA: Diagnosis not present

## 2023-10-14 DIAGNOSIS — N1832 Chronic kidney disease, stage 3b: Secondary | ICD-10-CM | POA: Diagnosis not present

## 2023-10-14 DIAGNOSIS — E1122 Type 2 diabetes mellitus with diabetic chronic kidney disease: Secondary | ICD-10-CM | POA: Diagnosis not present

## 2023-10-14 DIAGNOSIS — D631 Anemia in chronic kidney disease: Secondary | ICD-10-CM | POA: Diagnosis not present

## 2023-10-28 ENCOUNTER — Telehealth: Payer: Self-pay | Admitting: Nurse Practitioner

## 2023-10-28 NOTE — Telephone Encounter (Signed)
 Patient states that his Ozempic  through patient assistance should have been delivered to our office 3 weeks ago? Do we have a status on when it will arrive?

## 2023-11-11 ENCOUNTER — Ambulatory Visit: Payer: Medicare Other | Admitting: Nurse Practitioner

## 2023-12-10 ENCOUNTER — Telehealth: Payer: Self-pay

## 2023-12-10 NOTE — Telephone Encounter (Signed)
 Spoke to patient and informed him that his Patient Assistance medication had arrived at this office today and to please come pick it up. He agreed that he would pick it up today.

## 2023-12-30 ENCOUNTER — Ambulatory Visit: Payer: Medicare Other | Admitting: Family Medicine

## 2024-01-08 ENCOUNTER — Ambulatory Visit: Payer: Medicare Other | Admitting: Nurse Practitioner

## 2024-01-12 ENCOUNTER — Ambulatory Visit: Payer: Medicare Other | Admitting: Family Medicine

## 2024-01-12 ENCOUNTER — Encounter: Payer: Self-pay | Admitting: Family Medicine

## 2024-01-12 VITALS — BP 122/82 | HR 82 | Temp 98.3°F | Ht 69.0 in | Wt 247.5 lb

## 2024-01-12 DIAGNOSIS — N183 Chronic kidney disease, stage 3 unspecified: Secondary | ICD-10-CM | POA: Diagnosis not present

## 2024-01-12 DIAGNOSIS — Z7984 Long term (current) use of oral hypoglycemic drugs: Secondary | ICD-10-CM

## 2024-01-12 DIAGNOSIS — E1129 Type 2 diabetes mellitus with other diabetic kidney complication: Secondary | ICD-10-CM

## 2024-01-12 DIAGNOSIS — E559 Vitamin D deficiency, unspecified: Secondary | ICD-10-CM | POA: Diagnosis not present

## 2024-01-12 DIAGNOSIS — E1165 Type 2 diabetes mellitus with hyperglycemia: Secondary | ICD-10-CM | POA: Diagnosis not present

## 2024-01-12 DIAGNOSIS — E1169 Type 2 diabetes mellitus with other specified complication: Secondary | ICD-10-CM

## 2024-01-12 DIAGNOSIS — I700 Atherosclerosis of aorta: Secondary | ICD-10-CM

## 2024-01-12 DIAGNOSIS — I10 Essential (primary) hypertension: Secondary | ICD-10-CM

## 2024-01-12 DIAGNOSIS — E1122 Type 2 diabetes mellitus with diabetic chronic kidney disease: Secondary | ICD-10-CM

## 2024-01-12 DIAGNOSIS — E785 Hyperlipidemia, unspecified: Secondary | ICD-10-CM

## 2024-01-12 NOTE — Assessment & Plan Note (Signed)
 DM, HTN, HLD well controlled

## 2024-01-12 NOTE — Progress Notes (Signed)
 New Patient Office Visit  Subjective    Patient ID: Edwin Osborne, male    DOB: [birth year]  Age: 89 y.o. MRN: [id number]  CC:  Chief Complaint  Patient presents with   Establish Care   Medical Management of Chronic Issues    HPI Edwin Osborne presents to establish care. Oriented to practice routines and expectations. Has been seeing PCP regularly. PMH includes HTN, HLD, DM, CKD 3b, BPH, GERD, thoracic aortic atherosclerosis. Edwin Osborne does see Nephrology twice yearly.   HTN: not monitoring at home, taking Olmesartan  40mg  daily, cardura  4mg  daily, Lasix  4mg  daily, DASH diet  HLD: continue Pravastatin  40mg  daily, heart healthy diet, limited physical activity, goal LDL <7  DM2: daily fasting BG at home 180s, glimepiride  4mg  daily, Ozempic  2mg  weekly SQ  CKD 3: pushing fluids, avoiding NSAIDs, HTN and DM well controlled  BPH well controlled on Doxazosin  4mg  daily  Health Maintenance  Topic Date Due   OPHTHALMOLOGY EXAM  11/09/2019   Medicare Annual Wellness (AWV)  10/22/2023   COVID-19 Vaccine (4 - 2024-25 season) 01/28/2024 (Originally 05/24/2023)   Zoster Vaccines- Shingrix (1 of 2) 04/12/2024 (Originally 04/06/1954)   HEMOGLOBIN A1C  02/08/2024   INFLUENZA VACCINE  04/22/2024   FOOT EXAM  08/10/2024   DTaP/Tdap/Td (2 - Tdap) 08/30/2025   Pneumonia Vaccine 62+ Years old  Completed   HPV VACCINES  Aged Out   Meningococcal B Vaccine  Aged Out   Colonoscopy  Discontinued      Outpatient Encounter Medications as of 01/12/2024  Medication Sig   aspirin  EC 81 MG tablet Take 81 mg by mouth at bedtime.    Blood Glucose Monitoring Suppl (ONE TOUCH ULTRA 2) w/Device KIT check blood sugar 1 time daily-E11.21   Cholecalciferol (VITAMIN D -3) 25 MCG (1000 UT) CAPS Take 1,000 Units by mouth at bedtime.   Cinnamon 500 MG TABS Take 1,000 mg by mouth daily.    doxazosin  (CARDURA ) 4 MG tablet Take  1 tablet  at Bedtime  for BP & Prostate           /     TAKE   BY    MOUTH    famotidine  (PEPCID ) 40 MG tablet Take 1 tablet Daily for Indigestion & Heartburn   furosemide  (LASIX ) 40 MG tablet TAKE 1 TABLET BY MOUTH DAILY FOR BLOOD PRESSURE AND FLUID RETENTION OR ANKLE SWELLING   glimepiride  (AMARYL ) 4 MG tablet Take 1/2 to 1 tablet  2 x /day with Meals for Diabetes                                                                                                    TAKE 1/2 TO 1 TABLET BY MOUTH TWICE DAILY WITH MEALS FOR DIABETES   Lancets (ONETOUCH ULTRASOFT) lancets Check blood sugar 1 time daily-E11.21   magnesium  oxide (MAG-OX) 400 MG tablet Take by mouth.   Multiple Vitamins-Minerals (ONE-A-DAY MENS 50+ ADVANTAGE) TABS Take 1 tablet by mouth daily with breakfast.   olmesartan  (BENICAR ) 40 MG tablet Take  1 tablet  Daily for BP & Diabetic  Kidney Protection             /              TAKE                                         BY                                                 MOUTH   Omega-3 Fatty Acids (FISH OIL) 1200 MG CAPS Take 1,200 mg by mouth daily.   ONETOUCH ULTRA test strip USE TO CHECK BLOOD SUGAR ONCE DAILY   pravastatin  (PRAVACHOL ) 40 MG tablet TAKE 1 TABLET BY MOUTH EVERY NIGHT AT BEDTIME FOR CHOLETEROL   Semaglutide , 2 MG/DOSE, (OZEMPIC , 2 MG/DOSE,) 8 MG/3ML SOPN Inject 2mg  into the skin once weekly for Diabetes   [DISCONTINUED] albuterol  (VENTOLIN  HFA) 108 (90 Base) MCG/ACT inhaler Inhale 2 puffs into the lungs every 6 (six) hours as needed for wheezing or shortness of breath.   No facility-administered encounter medications on file as of 01/12/2024.    Past Medical History:  Diagnosis Date   AKI (acute kidney injury) (HCC) 04/20/2020   Arthritis    BPH (benign prostatic hyperplasia)    Cataract    CKD (chronic kidney disease), stage III (HCC)    Depression    "usually a happy go lucky"   Diabetes mellitus    Diverticulosis    GERD (gastroesophageal reflux disease)    sometimes will take omeprazole    Hyperlipidemia    Hypertension    Sepsis  secondary to UTI (HCC) 05/04/2020    Past Surgical History:  Procedure Laterality Date   CATARACT EXTRACTION, BILATERAL Bilateral 2020   Dr. Steffanie Edouard   CHOLECYSTECTOMY N/A 11/29/2015   Procedure: LAPAROSCOPIC CHOLECYSTECTOMY WITH INTRAOPERATIVE CHOLANGIOGRAM;  Surgeon: Sim Dryer, MD;  Location: [facility];  Service: General;  Laterality: N/A;   COLONOSCOPY  2005, 08/21/11   2005 tubulovillous adenoma and adenomas 2012 - 3 adenomas, largest 10 mm, diverticulosis   FRACTURE SURGERY     ? right foot as a child   KNEE ARTHROSCOPY     TONSILLECTOMY AND ADENOIDECTOMY     VEIN LIGATION AND STRIPPING     left    Family History  Problem Relation Age of Onset   Prostate cancer Brother    Diabetes Brother    Colon cancer Neg Hx     Social History   Socioeconomic History   Marital status: Married    Spouse name: Not on file   Number of children: 3   Years of education: Not on file   Highest education level: Not on file  Occupational History   Occupation: retired  Tobacco Use   Smoking status: Former    Types: Cigars   Smokeless tobacco: Never   Tobacco comments:    once weekly cigars, remotely chewed tobacco  Vaping Use   Vaping status: Never Used  Substance and Sexual Activity   Alcohol use: Yes    Comment: once monthly   Drug use: No   Sexual activity: Not on file  Other Topics Concern   Not on file  Social History Narrative   Not on file   Social Drivers of Health  Financial Resource Strain: Not on file  Food Insecurity: Not on file  Transportation Needs: Not on file  Physical Activity: Not on file  Stress: Not on file  Social Connections: Not on file  Intimate Partner Violence: Not on file    Review of Systems  Constitutional: Negative.   HENT: Negative.    Eyes: Negative.   Respiratory: Negative.    Cardiovascular: Negative.   Gastrointestinal: Negative.   Genitourinary: Negative.   Musculoskeletal: Negative.   Skin: Negative.   Neurological:  Negative.   Endo/Heme/Allergies: Negative.   Psychiatric/Behavioral: Negative.    All other systems reviewed and are negative.       Objective    BP 122/82   Pulse 82   Temp 98.3 F (36.8 C)   Ht 5\' 9"  (1.753 m)   Wt 247 lb 8 oz (112.3 kg)   SpO2 96%   BMI 36.55 kg/m   Physical Exam Vitals and nursing note reviewed.  Constitutional:      Appearance: Normal appearance. He is obese.  HENT:     Head: Normocephalic and atraumatic.  Cardiovascular:     Rate and Rhythm: Normal rate and regular rhythm.     Pulses: Normal pulses.     Heart sounds: Normal heart sounds.  Pulmonary:     Effort: Pulmonary effort is normal.     Breath sounds: Normal breath sounds.  Skin:    General: Skin is warm and dry.     Capillary Refill: Capillary refill takes less than 2 seconds.  Neurological:     General: No focal deficit present.     Mental Status: He is alert and oriented to person, place, and time. Mental status is at baseline.  Psychiatric:        Mood and Affect: Mood normal.        Behavior: Behavior normal.        Thought Content: Thought content normal.        Judgment: Judgment normal.         Assessment & Plan:   Problem List Items Addressed This Visit     Essential hypertension - Primary   Well controlled on cardura , lasix , olmesartan . Recommend heart healthy diet such as Mediterranean diet with whole grains, fruits, vegetable, fish, lean meats, nuts, and olive oil. Limit salt. Encouraged moderate walking, 3-5 times/week for 30-50 minutes each session. Aim for at least 150 minutes.week. Goal should be pace of 3 miles/hours, or walking 1.5 miles in 30 minutes. Avoid tobacco products. Avoid excess alcohol. Take medications as prescribed and bring medications and blood pressure log with cuff to each office visit. Seek medical care for chest pain, palpitations, shortness of breath with exertion, dizziness/lightheadedness, vision changes, recurrent headaches, or swelling of  extremities. Follow up in 3 months or sooner if needed.      Relevant Orders   CBC with Differential/Platelet   Comprehensive metabolic panel with GFR   Lipid panel   Hemoglobin A1c   VITAMIN D  25 Hydroxy (Vit-D Deficiency, Fractures)   Poorly controlled type 2 diabetes mellitus with renal complication (HCC)   A1c today. A1c and uACR done. Foot exam UTD. Vaccines UTD. Retinal eye exam UTD. Recommend heart healthy diet such as Mediterranean diet with whole grains, fruits, vegetable, fish, lean meats, nuts, and olive oil. Limit salt. Encouraged moderate walking, 3-5 times/week for 30-50 minutes each session. Aim for at least 150 minutes.week. Goal should be pace of 3 miles/hours, or walking 1.5 miles in 30 minutes. Seek medical  care for urinary frequency, extreme thirst, vision changes, lightheadedness, dizziness.  Follow up in 3 months or sooner if needed      Relevant Orders   CBC with Differential/Platelet   Comprehensive metabolic panel with GFR   Lipid panel   Hemoglobin A1c   VITAMIN D  25 Hydroxy (Vit-D Deficiency, Fractures)   Hyperlipidemia associated with type 2 diabetes mellitus (HCC)   Continue Pravastatin . Work on diet and exercise. Your labs showed elevated cholesterol. I recommend consuming a heart healthy diet such as Mediterranean diet or DASH diet with whole grains, fruits, vegetable, fish, lean meats, nuts, and olive oil. Limit sweets and processed foods. I also encourage moderate intensity exercise 150 minutes weekly. This is 3-5 times weekly for 30-50 minutes each session. Goal should be pace of 3 miles/hours, or walking 1.5 miles in 30 minutes. The ASCVD Risk score (Arnett DK, et al., 2019) failed to calculate for the following reasons:   The 2019 ASCVD risk score is only valid for ages 27 to 59       Vitamin D  deficiency   Continue vitamin D . Goal 60-100.      Relevant Orders   CBC with Differential/Platelet   Comprehensive metabolic panel with GFR   Lipid panel    Hemoglobin A1c   VITAMIN D  25 Hydroxy (Vit-D Deficiency, Fractures)   Stage 3 chronic kidney disease due to type 2 diabetes mellitus (HCC)   Contine olmesartan , stay hydrated, avoid NSAIDs      Relevant Orders   CBC with Differential/Platelet   Comprehensive metabolic panel with GFR   Lipid panel   Hemoglobin A1c   VITAMIN D  25 Hydroxy (Vit-D Deficiency, Fractures)   Aortic Atherosclerosis (HCC) by Abd CT scan 2018   DM, HTN, HLD well controlled       Return in about 3 months (around 04/12/2024) for AWV, 3 month chronic follow up with labs week prior.   Jenelle Mis, FNP

## 2024-01-12 NOTE — Assessment & Plan Note (Signed)
 Continue vitamin D . Goal 60-100.

## 2024-01-12 NOTE — Assessment & Plan Note (Signed)
 A1c today. A1c and uACR done. Foot exam UTD. Vaccines UTD. Retinal eye exam UTD. Recommend heart healthy diet such as Mediterranean diet with whole grains, fruits, vegetable, fish, lean meats, nuts, and olive oil. Limit salt. Encouraged moderate walking, 3-5 times/week for 30-50 minutes each session. Aim for at least 150 minutes.week. Goal should be pace of 3 miles/hours, or walking 1.5 miles in 30 minutes. Seek medical care for urinary frequency, extreme thirst, vision changes, lightheadedness, dizziness.  Follow up in 3 months or sooner if needed

## 2024-01-12 NOTE — Assessment & Plan Note (Signed)
 Contine olmesartan , stay hydrated, avoid NSAIDs

## 2024-01-12 NOTE — Assessment & Plan Note (Signed)
 Continue Pravastatin . Work on diet and exercise. Your labs showed elevated cholesterol. I recommend consuming a heart healthy diet such as Mediterranean diet or DASH diet with whole grains, fruits, vegetable, fish, lean meats, nuts, and olive oil. Limit sweets and processed foods. I also encourage moderate intensity exercise 150 minutes weekly. This is 3-5 times weekly for 30-50 minutes each session. Goal should be pace of 3 miles/hours, or walking 1.5 miles in 30 minutes. The ASCVD Risk score (Arnett DK, et al., 2019) failed to calculate for the following reasons:   The 2019 ASCVD risk score is only valid for ages 16 to 76

## 2024-01-12 NOTE — Patient Instructions (Signed)
 It was great to meet you today and I'm excited to have you join the [facility] practice. I hope you had a positive experience today! If you feel so inclined, please feel free to recommend our practice to friends and family. Kurtis Bushman, FNP-C

## 2024-01-12 NOTE — Assessment & Plan Note (Signed)
 Well controlled on cardura , lasix , olmesartan . Recommend heart healthy diet such as Mediterranean diet with whole grains, fruits, vegetable, fish, lean meats, nuts, and olive oil. Limit salt. Encouraged moderate walking, 3-5 times/week for 30-50 minutes each session. Aim for at least 150 minutes.week. Goal should be pace of 3 miles/hours, or walking 1.5 miles in 30 minutes. Avoid tobacco products. Avoid excess alcohol. Take medications as prescribed and bring medications and blood pressure log with cuff to each office visit. Seek medical care for chest pain, palpitations, shortness of breath with exertion, dizziness/lightheadedness, vision changes, recurrent headaches, or swelling of extremities. Follow up in 3 months or sooner if needed.

## 2024-01-13 LAB — COMPREHENSIVE METABOLIC PANEL WITH GFR
AG Ratio: 1.8 (calc) (ref 1.0–2.5)
ALT: 19 U/L (ref 9–46)
AST: 19 U/L (ref 10–35)
Albumin: 3.9 g/dL (ref 3.6–5.1)
Alkaline phosphatase (APISO): 65 U/L (ref 35–144)
BUN/Creatinine Ratio: 15 (calc) (ref 6–22)
BUN: 28 mg/dL — ABNORMAL HIGH (ref 7–25)
CO2: 27 mmol/L (ref 20–32)
Calcium: 9.1 mg/dL (ref 8.6–10.3)
Chloride: 101 mmol/L (ref 98–110)
Creat: 1.87 mg/dL — ABNORMAL HIGH (ref 0.70–1.22)
Globulin: 2.2 g/dL (ref 1.9–3.7)
Glucose, Bld: 210 mg/dL — ABNORMAL HIGH (ref 65–99)
Potassium: 4.8 mmol/L (ref 3.5–5.3)
Sodium: 138 mmol/L (ref 135–146)
Total Bilirubin: 0.5 mg/dL (ref 0.2–1.2)
Total Protein: 6.1 g/dL (ref 6.1–8.1)
eGFR: 35 mL/min/{1.73_m2} — ABNORMAL LOW (ref >=60)

## 2024-01-13 LAB — HEMOGLOBIN A1C
Hgb A1c MFr Bld: 9 % — ABNORMAL HIGH (ref <5.7)
Mean Plasma Glucose: 212 mg/dL
eAG (mmol/L): 11.7 mmol/L

## 2024-01-13 LAB — CBC WITH DIFFERENTIAL/PLATELET
Absolute Lymphocytes: 1686 {cells}/uL (ref 850–3900)
Absolute Monocytes: 876 {cells}/uL (ref 200–950)
Basophils Absolute: 51 {cells}/uL (ref 0–200)
Basophils Relative: 0.7 %
Eosinophils Absolute: 190 {cells}/uL (ref 15–500)
Eosinophils Relative: 2.6 %
HCT: 41.8 % (ref 38.5–50.0)
Hemoglobin: 13.7 g/dL (ref 13.2–17.1)
MCH: 29.8 pg (ref 27.0–33.0)
MCHC: 32.8 g/dL (ref 32.0–36.0)
MCV: 91.1 fL (ref 80.0–100.0)
MPV: 11 fL (ref 7.5–12.5)
Monocytes Relative: 12 %
Neutro Abs: 4497 {cells}/uL (ref 1500–7800)
Neutrophils Relative %: 61.6 %
Platelets: 180 10*3/uL (ref 140–400)
RBC: 4.59 10*6/uL (ref 4.20–5.80)
RDW: 12.7 % (ref 11.0–15.0)
Total Lymphocyte: 23.1 %
WBC: 7.3 10*3/uL (ref 3.8–10.8)

## 2024-01-13 LAB — LIPID PANEL
Cholesterol: 141 mg/dL (ref <200)
HDL: 41 mg/dL (ref >=40)
LDL Cholesterol (Calc): 72 mg/dL
Non-HDL Cholesterol (Calc): 100 mg/dL (ref <130)
Total CHOL/HDL Ratio: 3.4 (calc) (ref <5.0)
Triglycerides: 186 mg/dL — ABNORMAL HIGH (ref <150)

## 2024-01-13 LAB — VITAMIN D 25 HYDROXY (VIT D DEFICIENCY, FRACTURES): Vit D, 25-Hydroxy: 66 ng/mL (ref 30–100)

## 2024-01-14 ENCOUNTER — Other Ambulatory Visit: Payer: Self-pay | Admitting: Family Medicine

## 2024-01-14 DIAGNOSIS — E1165 Type 2 diabetes mellitus with hyperglycemia: Secondary | ICD-10-CM

## 2024-01-19 ENCOUNTER — Telehealth: Payer: Self-pay | Admitting: Family Medicine

## 2024-01-19 NOTE — Telephone Encounter (Signed)
 Patient requested refill on Cardura  last sent 09/20/23 for 3 months with 3 refills. Called patient and left voicemail that he should call [facility] for refill on file.  Please be advised during initial call patient requested to have the clinic contact the pharmacy that way they know to contact the office for future refills since his previous PCP is now deceased.

## 2024-01-19 NOTE — Telephone Encounter (Signed)
 Pt requests to have the clinic contact the pharmacy that way they know to contact the office for future refills since his previous PCP is now deceased.   Copied from CRM [id number]. Topic: Clinical - Medication Refill >> Jan 19, 2024  3:22 PM Baldemar Lev wrote: Most Recent Primary Care Visit:  Provider: Yolanda Hence S  Department: [facility]  Visit Type: NEW PT - OFFICE VISIT  Date: 01/12/2024  Medication: doxazosin  (CARDURA ) 4 MG tablet  Has the patient contacted their pharmacy? Yes (Agent: If no, request that the patient contact the pharmacy for the refill. If patient does not wish to contact the pharmacy document the reason why and proceed with request.) (Agent: If yes, when and what did the pharmacy advise?)  Is this the correct pharmacy for this prescription? Yes If no, delete pharmacy and type the correct one.  This is the patient's preferred pharmacy:  [id number] Jonette Nestle, [place] - [address] AT [facility] [address] [facility] [address] [place] [address] Phone: [phone] Fax: [phone]   Has the prescription been filled recently? Yes  Is the patient out of the medication? Yes  Has the patient been seen for an appointment in the last year OR does the patient have an upcoming appointment? Yes  Can we respond through MyChart? Yes  Agent: Please be advised that Rx refills may take up to 3 business days. We ask that you follow-up with your pharmacy.

## 2024-01-21 ENCOUNTER — Telehealth: Payer: Self-pay | Admitting: *Deleted

## 2024-01-22 ENCOUNTER — Other Ambulatory Visit: Payer: Self-pay

## 2024-01-22 DIAGNOSIS — N138 Other obstructive and reflux uropathy: Secondary | ICD-10-CM

## 2024-01-22 DIAGNOSIS — R609 Edema, unspecified: Secondary | ICD-10-CM

## 2024-01-22 DIAGNOSIS — E1169 Type 2 diabetes mellitus with other specified complication: Secondary | ICD-10-CM

## 2024-01-22 DIAGNOSIS — E1129 Type 2 diabetes mellitus with other diabetic kidney complication: Secondary | ICD-10-CM

## 2024-01-22 DIAGNOSIS — I10 Essential (primary) hypertension: Secondary | ICD-10-CM

## 2024-01-22 MED ORDER — GLIMEPIRIDE 4 MG PO TABS
ORAL_TABLET | ORAL | 3 refills | Status: AC
Start: 2024-01-22 — End: ?

## 2024-01-22 MED ORDER — OLMESARTAN MEDOXOMIL 40 MG PO TABS
ORAL_TABLET | ORAL | 3 refills | Status: AC
Start: 2024-01-22 — End: ?

## 2024-01-22 MED ORDER — ONETOUCH ULTRA VI STRP: ORAL_STRIP | 3 refills | Status: AC

## 2024-01-22 MED ORDER — FUROSEMIDE 40 MG PO TABS: ORAL_TABLET | ORAL | 3 refills | Status: AC

## 2024-01-22 MED ORDER — DOXAZOSIN MESYLATE 4 MG PO TABS: ORAL_TABLET | ORAL | 3 refills | Status: AC

## 2024-01-22 MED ORDER — PRAVASTATIN SODIUM 40 MG PO TABS
ORAL_TABLET | ORAL | 3 refills | Status: AC
Start: 2024-01-22 — End: ?

## 2024-01-22 NOTE — Progress Notes (Signed)
 Care Guide Pharmacy Note  01/22/2024 Name: NIKITA KOPE MRN: [id number] DOB: 07-07-1935  Referred By: Jenelle Mis, FNP Reason for referral: Complex Care Management (Initial outreach to schedule referral with PharmD Rolando Cliche )   Acquanetta Acre is a 89 y.o. year old male who is a primary care patient of Jenelle Mis, FNP.  Paulina Bors Madl was referred to the pharmacist for assistance related to: DMII  A second unsuccessful telephone outreach was attempted today to contact the patient who was referred to the pharmacy team for assistance with medication management. Additional attempts will be made to contact the patient.  Barnie Bora  [facility]  [facility], [facility] Guide  Direct Dial: [phone]  Fax [phone]

## 2024-01-22 NOTE — Progress Notes (Signed)
 Care Guide Pharmacy Note  01/22/2024 Name: [name] MRN: [id number] DOB: September 17, 1935  Referred By: Jenelle Mis, FNP Reason for referral: Complex Care Management (Initial outreach to schedule referral with PharmD Rolando Cliche )   Edwin Osborne is a 89 y.o. year old male who is a primary care patient of Jenelle Mis, FNP.  Paulina Bors Fobes was referred to the pharmacist for assistance related to: DMII  Successful contact was made with the patient to discuss pharmacy services including being ready for the pharmacist to call at least 5 minutes before the scheduled appointment time and to have medication bottles and any blood pressure readings ready for review. The patient agreed to meet with the pharmacist via telephone visit on (date/time). 01/25/24 at 1:00 PM   Barnie Bora  [facility], [facility] Guide  Direct Dial: [phone]  Fax [phone]

## 2024-01-22 NOTE — Progress Notes (Signed)
 Care Guide Pharmacy Note  01/21/2024 Late Entry Name: Edwin Osborne MRN: [id number] DOB: 1934/11/12  Referred By: Jenelle Mis, FNP Reason for referral: Complex Care Management (Initial outreach to schedule referral with PharmD Rolando Cliche )   Edwin Osborne is a 89 y.o. year old male who is a primary care patient of Jenelle Mis, FNP.  Edwin Osborne was referred to the pharmacist for assistance related to: DMII  An unsuccessful telephone outreach was attempted today to contact the patient who was referred to the pharmacy team for assistance with medication management. Additional attempts will be made to contact the patient.  Edwin Osborne  [facility] Health  Value-Based Care Institute, [facility] Guide  Direct Dial: [phone]  Fax [phone]

## 2024-01-25 ENCOUNTER — Other Ambulatory Visit: Payer: Self-pay

## 2024-01-26 NOTE — Progress Notes (Signed)
01/26/2024 Name: Edwin Osborne MRN: [id number] DOB: September 15, 1935  Chief Complaint  Patient presents with   Diabetes    Edwin Osborne is a 89 y.o. year old male who presented for a telephone visit.   They were referred to the pharmacist by their PCP for assistance in managing diabetes.    Subjective:  Care Team: Primary Care Provider: Jenelle Mis, FNP ; Next Scheduled Visit:   Future Appointments  Date Time Provider Department Center  02/09/2024 10:00 AM Rolando Cliche, [facility] None  04/07/2024  8:30 AM [facility]  04/12/2024  2:00 PM Jenelle Mis, FNP [facility]    Medication Access/Adherence  Current Pharmacy:  [facility] #[id number] Jonette Nestle, [place] - [address] AT [facility] [address] [facility] [address] [place] [address] Phone: [phone] Fax: [phone]  [id number] - [address] ([place]), [place] - [address] [place] [address] Phone: [phone] Fax: [phone]  [facility] [place], [place] - [address] [place] [address] Phone: [phone] Fax: [phone]   Patient reports affordability concerns with their medications: No  Patient reports access/transportation concerns to their pharmacy: No  Patient reports adherence concerns with their medications:  Yes     Diabetes:  Current medications:  Medications tried in the past: trulicity , metformin. Metformin stopped d/t kidney function -- believe this was years ago before guidelines adjusted to saying continuation was ok at reduced dose of metformin if GFR 30-45.  Current glucose readings: ranging from 140-200 Using glucose meter; testing 2 times daily  Patient denies hypoglycemic s/sx including dizziness, shakiness, sweating. Patient denies hyperglycemic symptoms including polyuria, polydipsia, polyphagia, nocturia, neuropathy, blurred  vision.  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 Is going to consider going out to a facility pool near by with his so, who's asked him to go before.   Current medication access support: n/a   Objective:  Lab Results  Component Value Date   HGBA1C 9.0 (H) 01/12/2024    Lab Results  Component Value Date   CREATININE 1.87 (H) 01/12/2024   BUN 28 (H) 01/12/2024   NA 138 01/12/2024   K 4.8 01/12/2024   CL 101 01/12/2024   CO2 27 01/12/2024    Lab Results  Component Value Date   CHOL 141 01/12/2024   HDL 41 01/12/2024   LDLCALC 72 01/12/2024   TRIG 186 (H) 01/12/2024   CHOLHDL 3.4 01/12/2024    Medications Reviewed Today   Medications were not reviewed in this encounter       Assessment/Plan:   Diabetes: - Currently controlled - Reviewed long term cardiovascular and renal outcomes of uncontrolled blood sugar - Reviewed goal A1c, goal fasting, and goal 2 hour post prandial glucose - Reviewed dietary modifications including low carb diet - Reviewed lifestyle modifications including:  - Recommend to exercise 30 min per day, given knee pain, recommended to take part in water walking a local facility - previously son offered to take him, which he had declined. Now is willing to make changes.  - Reviewed nonadherence related to glimepiride  4 mg. Had never started taking second daily dose; agreeable to BID dosing now given persistent BG (FBG and PPD) 140-200  Follow Up Plan:   Plan to f/u 2 weeks to see how he is doing on these changes.   Rolando Cliche, PharmD, BCGP Clinical Pharmacist  [phone]    

## 2024-02-09 ENCOUNTER — Telehealth: Payer: Self-pay

## 2024-02-09 ENCOUNTER — Other Ambulatory Visit: Payer: Self-pay

## 2024-02-09 DIAGNOSIS — N1832 Chronic kidney disease, stage 3b: Secondary | ICD-10-CM | POA: Diagnosis not present

## 2024-02-09 NOTE — Progress Notes (Signed)
   02/09/2024  Patient ID: Edwin Osborne, male   DOB: 21-Oct-1934, 89 y.o.   MRN: [id number]  Attempted to contact patient for scheduled appointment for medication management. Left HIPAA compliant message for patient to return my call at their convenience.   Rolando Cliche, PharmD, BCGP Clinical Pharmacist  [phone]

## 2024-02-09 NOTE — Progress Notes (Signed)
 Signed in error. Patient no answer to visit.

## 2024-02-10 NOTE — Addendum Note (Signed)
 Addended by: Ahmed Ales on: 02/10/2024 08:41 AM   Modules accepted: Level of Service

## 2024-02-18 DIAGNOSIS — I129 Hypertensive chronic kidney disease with stage 1 through stage 4 chronic kidney disease, or unspecified chronic kidney disease: Secondary | ICD-10-CM | POA: Diagnosis not present

## 2024-02-18 DIAGNOSIS — D631 Anemia in chronic kidney disease: Secondary | ICD-10-CM | POA: Diagnosis not present

## 2024-02-18 DIAGNOSIS — E1122 Type 2 diabetes mellitus with diabetic chronic kidney disease: Secondary | ICD-10-CM | POA: Diagnosis not present

## 2024-02-18 DIAGNOSIS — N1832 Chronic kidney disease, stage 3b: Secondary | ICD-10-CM | POA: Diagnosis not present

## 2024-04-04 ENCOUNTER — Other Ambulatory Visit

## 2024-04-04 DIAGNOSIS — N183 Chronic kidney disease, stage 3 unspecified: Secondary | ICD-10-CM

## 2024-04-04 DIAGNOSIS — E1169 Type 2 diabetes mellitus with other specified complication: Secondary | ICD-10-CM

## 2024-04-04 DIAGNOSIS — N138 Other obstructive and reflux uropathy: Secondary | ICD-10-CM

## 2024-04-04 DIAGNOSIS — I10 Essential (primary) hypertension: Secondary | ICD-10-CM

## 2024-04-04 DIAGNOSIS — N401 Enlarged prostate with lower urinary tract symptoms: Secondary | ICD-10-CM | POA: Diagnosis not present

## 2024-04-04 DIAGNOSIS — E559 Vitamin D deficiency, unspecified: Secondary | ICD-10-CM

## 2024-04-04 DIAGNOSIS — E1165 Type 2 diabetes mellitus with hyperglycemia: Secondary | ICD-10-CM

## 2024-04-04 DIAGNOSIS — E1129 Type 2 diabetes mellitus with other diabetic kidney complication: Secondary | ICD-10-CM | POA: Diagnosis not present

## 2024-04-04 LAB — COMPREHENSIVE METABOLIC PANEL WITH GFR
AG Ratio: 1.8 (calc) (ref 1.0–2.5)
ALT: 20 U/L (ref 9–46)
AST: 17 U/L (ref 10–35)
Albumin: 3.9 g/dL (ref 3.6–5.1)
Alkaline phosphatase (APISO): 62 U/L (ref 35–144)
BUN/Creatinine Ratio: 15 (calc) (ref 6–22)
BUN: 29 mg/dL — ABNORMAL HIGH (ref 7–25)
CO2: 28 mmol/L (ref 20–32)
Calcium: 8.8 mg/dL (ref 8.6–10.3)
Chloride: 103 mmol/L (ref 98–110)
Creat: 1.95 mg/dL — ABNORMAL HIGH (ref 0.70–1.22)
Globulin: 2.2 g/dL (ref 1.9–3.7)
Glucose, Bld: 186 mg/dL — ABNORMAL HIGH (ref 65–99)
Potassium: 4.8 mmol/L (ref 3.5–5.3)
Sodium: 140 mmol/L (ref 135–146)
Total Bilirubin: 0.5 mg/dL (ref 0.2–1.2)
Total Protein: 6.1 g/dL (ref 6.1–8.1)
eGFR: 33 mL/min/1.73m2 — ABNORMAL LOW (ref >=60)

## 2024-04-04 LAB — LIPID PANEL
Cholesterol: 141 mg/dL (ref <200)
HDL: 36 mg/dL — ABNORMAL LOW (ref >=40)
LDL Cholesterol (Calc): 75 mg/dL
Non-HDL Cholesterol (Calc): 105 mg/dL (ref <130)
Total CHOL/HDL Ratio: 3.9 (calc) (ref <5.0)
Triglycerides: 199 mg/dL — ABNORMAL HIGH (ref <150)

## 2024-04-04 LAB — CBC WITH DIFFERENTIAL/PLATELET
Absolute Lymphocytes: 1747 {cells}/uL (ref 850–3900)
Absolute Monocytes: 767 {cells}/uL (ref 200–950)
Basophils Absolute: 43 {cells}/uL (ref 0–200)
Basophils Relative: 0.6 %
Eosinophils Absolute: 312 {cells}/uL (ref 15–500)
Eosinophils Relative: 4.4 %
HCT: 40.9 % (ref 38.5–50.0)
Hemoglobin: 13.2 g/dL (ref 13.2–17.1)
MCH: 29.7 pg (ref 27.0–33.0)
MCHC: 32.3 g/dL (ref 32.0–36.0)
MCV: 91.9 fL (ref 80.0–100.0)
MPV: 10.5 fL (ref 7.5–12.5)
Monocytes Relative: 10.8 %
Neutro Abs: 4232 {cells}/uL (ref 1500–7800)
Neutrophils Relative %: 59.6 %
Platelets: 167 Thousand/uL (ref 140–400)
RBC: 4.45 Million/uL (ref 4.20–5.80)
RDW: 12.7 % (ref 11.0–15.0)
Total Lymphocyte: 24.6 %
WBC: 7.1 Thousand/uL (ref 3.8–10.8)

## 2024-04-04 LAB — PSA: PSA: 1.62 ng/mL (ref <=4.00)

## 2024-04-04 LAB — VITAMIN D 25 HYDROXY (VIT D DEFICIENCY, FRACTURES): Vit D, 25-Hydroxy: 69 ng/mL (ref 30–100)

## 2024-04-07 ENCOUNTER — Ambulatory Visit: Payer: Self-pay | Admitting: Family Medicine

## 2024-04-07 ENCOUNTER — Other Ambulatory Visit

## 2024-04-12 ENCOUNTER — Ambulatory Visit

## 2024-04-12 ENCOUNTER — Ambulatory Visit (INDEPENDENT_AMBULATORY_CARE_PROVIDER_SITE_OTHER): Admitting: Family Medicine

## 2024-04-12 ENCOUNTER — Encounter: Payer: Self-pay | Admitting: Family Medicine

## 2024-04-12 VITALS — BP 124/78 | HR 92 | Temp 98.2°F | Ht 69.0 in | Wt 247.1 lb

## 2024-04-12 DIAGNOSIS — Z0000 Encounter for general adult medical examination without abnormal findings: Secondary | ICD-10-CM | POA: Diagnosis not present

## 2024-04-12 DIAGNOSIS — E1169 Type 2 diabetes mellitus with other specified complication: Secondary | ICD-10-CM | POA: Diagnosis not present

## 2024-04-12 DIAGNOSIS — E1165 Type 2 diabetes mellitus with hyperglycemia: Secondary | ICD-10-CM | POA: Diagnosis not present

## 2024-04-12 DIAGNOSIS — E785 Hyperlipidemia, unspecified: Secondary | ICD-10-CM

## 2024-04-12 DIAGNOSIS — N401 Enlarged prostate with lower urinary tract symptoms: Secondary | ICD-10-CM

## 2024-04-12 DIAGNOSIS — Z0001 Encounter for general adult medical examination with abnormal findings: Secondary | ICD-10-CM

## 2024-04-12 DIAGNOSIS — I700 Atherosclerosis of aorta: Secondary | ICD-10-CM

## 2024-04-12 DIAGNOSIS — E1129 Type 2 diabetes mellitus with other diabetic kidney complication: Secondary | ICD-10-CM | POA: Diagnosis not present

## 2024-04-12 DIAGNOSIS — N138 Other obstructive and reflux uropathy: Secondary | ICD-10-CM

## 2024-04-12 DIAGNOSIS — I10 Essential (primary) hypertension: Secondary | ICD-10-CM | POA: Diagnosis not present

## 2024-04-12 DIAGNOSIS — N183 Chronic kidney disease, stage 3 unspecified: Secondary | ICD-10-CM

## 2024-04-12 DIAGNOSIS — N1832 Chronic kidney disease, stage 3b: Secondary | ICD-10-CM

## 2024-04-12 DIAGNOSIS — E559 Vitamin D deficiency, unspecified: Secondary | ICD-10-CM

## 2024-04-12 DIAGNOSIS — E1122 Type 2 diabetes mellitus with diabetic chronic kidney disease: Secondary | ICD-10-CM

## 2024-04-12 MED ORDER — OMEPRAZOLE 20 MG PO CPDR: 20.0000 mg | DELAYED_RELEASE_CAPSULE | Freq: Every day | ORAL | 3 refills | Status: DC

## 2024-04-12 NOTE — Assessment & Plan Note (Signed)
 Continue Pravastatin . Work on diet and exercise. I recommend consuming a heart healthy diet such as Mediterranean diet or DASH diet with whole grains, fruits, vegetable, fish, lean meats, nuts, and olive oil. Limit sweets and processed foods. I also encourage moderate intensity exercise 150 minutes weekly. This is 3-5 times weekly for 30-50 minutes each session. Goal should be pace of 3 miles/hours, or walking 1.5 miles in 30 minutes. The ASCVD Risk score (Arnett DK, et al., 2019) failed to calculate for the following reasons:   The 2019 ASCVD risk score is only valid for ages 42 to 67

## 2024-04-12 NOTE — Assessment & Plan Note (Signed)
 A1c today. Continue glimepiride  4mg  BID and Ozempic  2mg  weekly. A1c and uACR done. Foot exam UTD. Vaccines UTD. Retinal eye exam UTD. Recommend heart healthy diet such as Mediterranean diet with whole grains, fruits, vegetable, fish, lean meats, nuts, and olive oil. Limit salt. Encouraged moderate walking, 3-5 times/week for 30-50 minutes each session. Aim for at least 150 minutes.week. Goal should be pace of 3 miles/hours, or walking 1.5 miles in 30 minutes. Seek medical care for urinary frequency, extreme thirst, vision changes, lightheadedness, dizziness.  Follow up in 3 months or sooner if needed

## 2024-04-12 NOTE — Progress Notes (Signed)
 Subjective:   Edwin Osborne is a 89 y.o. male who presents for Medicare Annual/Subsequent preventive examination.  Visit Complete: In person  Cardiac Risk Factors include: advanced age (>64men, >62 women);diabetes mellitus;dyslipidemia;obesity (BMI >30kg/m2);male gender;smoking/ tobacco exposure;sedentary lifestyle     Objective:    Today's Vitals   04/12/24 1427  BP: 124/78  Pulse: 92  Temp: 98.2 F (36.8 C)  SpO2: 98%  Weight: 247 lb 2 oz (112.1 kg)  Height: 5' 9 (1.753 m)   Body mass index is 36.49 kg/m.     04/12/2024    2:36 PM 04/09/2023   10:01 PM 10/21/2022   10:15 AM 04/26/2022    7:36 PM 10/08/2021    9:54 AM 10/04/2020   11:46 AM 05/04/2020    2:08 PM  Advanced Directives  Does Patient Have a Medical Advance Directive? Yes No No No Yes No No  Type of Advance Directive Living will    Healthcare Power of [place];Living will    Does patient want to make changes to medical advance directive? No - Patient declined    No - Patient declined    Copy of Healthcare Power of Attorney in Chart?     No - copy requested    Would patient like information on creating a medical advance directive?  No - Patient declined    No - Patient declined No - Patient declined    Current Medications (verified) Outpatient Encounter Medications as of 04/12/2024  Medication Sig   aspirin  EC 81 MG tablet Take 81 mg by mouth at bedtime.    Blood Glucose Monitoring Suppl (ONE TOUCH ULTRA 2) w/Device KIT check blood sugar 1 time daily-E11.21   Cholecalciferol (VITAMIN D -3) 25 MCG (1000 UT) CAPS Take 1,000 Units by mouth at bedtime.   Cinnamon 500 MG TABS Take 1,000 mg by mouth daily.    doxazosin  (CARDURA ) 4 MG tablet Take  1 tablet  at Bedtime  for BP & Prostate           /     TAKE   BY    MOUTH   famotidine  (PEPCID ) 40 MG tablet Take 1 tablet Daily for Indigestion & Heartburn   furosemide  (LASIX ) 40 MG tablet TAKE 1 TABLET BY MOUTH DAILY FOR BLOOD PRESSURE AND FLUID RETENTION OR ANKLE  SWELLING   glimepiride  (AMARYL ) 4 MG tablet Take 1/2 to 1 tablet  2 x /day with Meals for Diabetes                                                                                                    TAKE 1/2 TO 1 TABLET BY MOUTH TWICE DAILY WITH MEALS FOR DIABETES   glucose blood (ONETOUCH ULTRA) test strip USE TO CHECK BLOOD SUGAR ONCE DAILY.   Lancets (ONETOUCH ULTRASOFT) lancets Check blood sugar 1 time daily-E11.21   magnesium  oxide (MAG-OX) 400 MG tablet Take by mouth.   Multiple Vitamins-Minerals (ONE-A-DAY MENS 50+ ADVANTAGE) TABS Take 1 tablet by mouth daily with breakfast.   olmesartan  (BENICAR ) 40 MG tablet Take  1 tablet  Daily for BP &  Diabetic Kidney Protection             /              TAKE                                         BY                                                 MOUTH   Omega-3 Fatty Acids (FISH OIL) 1200 MG CAPS Take 1,200 mg by mouth daily.   pravastatin  (PRAVACHOL ) 40 MG tablet TAKE 1 TABLET BY MOUTH EVERY NIGHT AT BEDTIME FOR CHOLETEROL   Semaglutide , 2 MG/DOSE, (OZEMPIC , 2 MG/DOSE,) 8 MG/3ML SOPN Inject 2mg  into the skin once weekly for Diabetes   No facility-administered encounter medications on file as of 04/12/2024.    Allergies (verified) Tramadol    History: Past Medical History:  Diagnosis Date   AKI (acute kidney injury) (HCC) 04/20/2020   Arthritis    BPH (benign prostatic hyperplasia)    Cataract    CKD (chronic kidney disease), stage III (HCC)    Depression    usually a happy go lucky   Diabetes mellitus    Diverticulosis    GERD (gastroesophageal reflux disease)    sometimes will take omeprazole    Hyperlipidemia    Hypertension    Sepsis secondary to UTI (HCC) 05/04/2020   Past Surgical History:  Procedure Laterality Date   CATARACT EXTRACTION, BILATERAL Bilateral 2020   Dr. Paul   CHOLECYSTECTOMY N/A 11/29/2015   Procedure: LAPAROSCOPIC CHOLECYSTECTOMY WITH INTRAOPERATIVE CHOLANGIOGRAM;  Surgeon: Debby Shipper, MD;  Location:  [facility] OR;  Service: General;  Laterality: N/A;   COLONOSCOPY  2005, 08/21/11   2005 tubulovillous adenoma and adenomas 2012 - 3 adenomas, largest 10 mm, diverticulosis   FRACTURE SURGERY     ? right foot as a child   KNEE ARTHROSCOPY     TONSILLECTOMY AND ADENOIDECTOMY     VEIN LIGATION AND STRIPPING     left   Family History  Problem Relation Age of Onset   Prostate cancer Brother    Diabetes Brother    Colon cancer Neg Hx    Social History   Socioeconomic History   Marital status: Married    Spouse name: Not on file   Number of children: 3   Years of education: Not on file   Highest education level: Not on file  Occupational History   Occupation: retired  Tobacco Use   Smoking status: Former    Types: Cigars   Smokeless tobacco: Never   Tobacco comments:    once weekly cigars, remotely chewed tobacco  Vaping Use   Vaping status: Never Used  Substance and Sexual Activity   Alcohol use: Yes    Comment: once monthly   Drug use: No   Sexual activity: Not on file  Other Topics Concern   Not on file  Social History Narrative   Not on file   Social Drivers of Health   Financial Resource Strain: Low Risk  (04/12/2024)   Overall Financial Resource Strain (CARDIA)    Difficulty of Paying Living Expenses: Not hard at all  Food Insecurity: No Food Insecurity (04/12/2024)   Hunger Vital Sign  Worried About Programme researcher, broadcasting/film/video in the Last Year: Never true    Ran Out of Food in the Last Year: Never true  Transportation Needs: No Transportation Needs (04/12/2024)   PRAPARE - Administrator, Civil Service (Medical): No    Lack of Transportation (Non-Medical): No  Physical Activity: Sufficiently Active (04/12/2024)   Exercise Vital Sign    Days of Exercise per Week: 7 days    Minutes of Exercise per Session: 30 min  Stress: No Stress Concern Present (04/12/2024)   [employer] of Occupational Health - Occupational Stress Questionnaire    Feeling of Stress: Not  at all  Social Connections: Moderately Isolated (04/12/2024)   Social Connection and Isolation Panel    Frequency of Communication with Friends and Family: More than three times a week    Frequency of Social Gatherings with Friends and Family: More than three times a week    Attends Religious Services: Never    Database administrator or Organizations: No    Attends Engineer, structural: Never    Marital Status: Married    Tobacco Counseling Counseling given: Not Answered Tobacco comments: once weekly cigars, remotely chewed tobacco   Clinical Intake:  Pre-visit preparation completed: Yes  Pain : No/denies pain     BMI - recorded: 36.49 Nutritional Status: BMI > 30  Obese Nutritional Risks: None Diabetes: Yes CBG done?: No Did pt. bring in CBG monitor from home?: No  How often do you need to have someone help you when you read instructions, pamphlets, or other written materials from your doctor or pharmacy?: 2 - Rarely What is the last grade level you completed in school?: 12th grade  Interpreter Needed?: No  Information entered by :: Elida GORMAN [place], CMA   Activities of Daily Living    04/12/2024    2:30 PM  In your present state of health, do you have any difficulty performing the following activities:  Hearing? 0  Vision? 1  Comment Pt states that he sees better without his glasses  Difficulty concentrating or making decisions? 1  Walking or climbing stairs? 1  Dressing or bathing? 0  Doing errands, shopping? 0  Preparing Food and eating ? N  Using the Toilet? N  In the past six months, have you accidently leaked urine? N  Do you have problems with loss of bowel control? N  Managing your Medications? N  Managing your Finances? N  Housekeeping or managing your Housekeeping? N    Patient Care Team: Kayla Jeoffrey GORMAN, FNP as PCP - General (Family Medicine) Addie Cordella Hamilton, MD as Consulting Physician (Orthopedic Surgery) Pandora Cadet, [facility] as  Pharmacist (Pharmacist)  Indicate any recent Medical Services you may have received from other than [facility] providers in the past year (date may be approximate).     Assessment:   This is a routine wellness examination for [place].  Hearing/Vision screen Hearing Screening - Comments:: Pt states he hears fine Vision Screening - Comments:: Needs referral for eye dr   Goals Addressed               This Visit's Progress     To better himself everyday (pt-stated)         Depression Screen    04/12/2024    2:34 PM 01/12/2024    8:03 AM 01/21/2023    7:42 PM 10/21/2022   10:14 AM 01/15/2022   10:24 PM 10/08/2021    9:59 AM 10/04/2020  11:54 AM  PHQ 2/9 Scores  PHQ - 2 Score 2 6 0 0 0 0 0  PHQ- 9 Score 5 9         Fall Risk    04/12/2024    2:37 PM 01/12/2024    8:03 AM 01/21/2023    7:42 PM 10/21/2022   10:15 AM 01/15/2022   10:23 PM  Fall Risk   Falls in the past year? 0 0 0 0 0  Number falls in past yr: 0 0  0   Injury with Fall? 0 0  0   Risk for fall due to :   No Fall Risks  No Fall Risks  Follow up   Falls evaluation completed;Education provided;Falls prevention discussed  Falls evaluation completed;Education provided;Falls prevention discussed      Data saved with a previous flowsheet row definition    MEDICARE RISK AT HOME: Medicare Risk at Home Any stairs in or around the home?: Yes If so, are there any without handrails?: No Home free of loose throw rugs in walkways, pet beds, electrical cords, etc?: Yes Adequate lighting in your home to reduce risk of falls?: Yes Life alert?: No Use of a cane, walker or w/c?: No Grab bars in the bathroom?: Yes Shower chair or bench in shower?: Yes Elevated toilet seat or a handicapped toilet?: Yes  TIMED UP AND GO:  Was the test performed?  Yes  Length of time to ambulate 10 feet: 8 sec Gait steady and fast without use of assistive device    Cognitive Function:        04/12/2024    2:38 PM  6CIT Screen  What Year? 0  points  What month? 0 points  What time? 0 points  Count back from 20 0 points  Months in reverse 0 points  Repeat phrase 0 points  Total Score 0 points    Immunizations Immunization History  Administered Date(s) Administered   DT (Pediatric) 08/31/2015   Influenza Whole 05/24/2011, 08/06/2012, 07/07/2013   Influenza, High Dose Seasonal PF 06/21/2014, 08/31/2015, 06/03/2016, 07/15/2017, 07/29/2018, 07/04/2019, 07/02/2020, 07/09/2021, 07/09/2022, 08/11/2023   PFIZER(Purple Top)SARS-COV-2 Vaccination 10/15/2019, 11/06/2019, 08/30/2020   PNEUMOCOCCAL CONJUGATE-20 07/09/2021   Pneumococcal Conjugate-13 06/21/2014   Pneumococcal Polysaccharide-23 03/10/2005    TDAP status: Up to date  Flu Vaccine status: Up to date  Pneumococcal vaccine status: Up to date  Covid-19 vaccine status: Declined, Education has been provided regarding the importance of this vaccine but patient still declined. Advised may receive this vaccine at local pharmacy or Health Dept.or vaccine clinic. Aware to provide a copy of the vaccination record if obtained from local pharmacy or Health Dept. Verbalized acceptance and understanding.  Qualifies for Shingles Vaccine? Yes   Zostavax completed No   Shingrix Completed?: No.    Education has been provided regarding the importance of this vaccine. Patient has been advised to call insurance company to determine out of pocket expense if they have not yet received this vaccine. Advised may also receive vaccine at local pharmacy or Health Dept. Verbalized acceptance and understanding.  Screening Tests Health Maintenance  Topic Date Due   OPHTHALMOLOGY EXAM  11/09/2019   Zoster Vaccines- Shingrix (1 of 2) 04/12/2024 (Originally 04/06/1954)   COVID-19 Vaccine (4 - 2024-25 season) 04/28/2024 (Originally 05/24/2023)   INFLUENZA VACCINE  04/22/2024   HEMOGLOBIN A1C  07/13/2024   FOOT EXAM  08/10/2024   Medicare Annual Wellness (AWV)  04/12/2025   DTaP/Tdap/Td (2 - Tdap)  08/30/2025   Pneumococcal Vaccine:  50+ Years  Completed   Hepatitis B Vaccines  Aged Out   HPV VACCINES  Aged Out   Meningococcal B Vaccine  Aged Out   Colonoscopy  Discontinued    Health Maintenance  Health Maintenance Due  Topic Date Due   OPHTHALMOLOGY EXAM  11/09/2019    Colorectal cancer screening: No longer required.   Lung Cancer Screening: (Low Dose CT Chest recommended if Age 20-80 years, 20 pack-year currently smoking OR have quit w/in 15years.) does not qualify.   Lung Cancer Screening Referral: no 20 pack year smoking history   Additional Screening:  Hepatitis C Screening: does not qualify;  Vision Screening: Recommended annual ophthalmology exams for early detection of glaucoma and other disorders of the eye. Is the patient up to date with their annual eye exam?  No  Who is the provider or what is the name of the office in which the patient attends annual eye exams? Does not see an eye dr If pt is not established with a provider, would they like to be referred to a provider to establish care? No .   Dental Screening: Recommended annual dental exams for proper oral hygiene  Diabetic Foot Exam: Diabetic Foot Exam: Completed 08/10/2024  Community Resource Referral / Chronic Care Management: CRR required this visit?  No   CCM required this visit?  No     Plan:     I have personally reviewed and noted the following in the patient's chart:   Medical and social history Use of alcohol, tobacco or illicit drugs  Current medications and supplements including opioid prescriptions. Patient is not currently taking opioid prescriptions. Functional ability and status Nutritional status Physical activity Advanced directives List of other physicians Hospitalizations, surgeries, and ER visits in previous 12 months Vitals Screenings to include cognitive, depression, and falls Referrals and appointments  In addition, I have reviewed and discussed with patient  certain preventive protocols, quality metrics, and best practice recommendations. A written personalized care plan for preventive services as well as general preventive health recommendations were provided to patient.     Jeoffrey GORMAN Barrio, FNP   04/12/2024   After Visit Summary: (In Person-Declined) Patient declined AVS at this time.  Nurse Notes: It was a pleasure assisting you today, Mr Gencarelli!

## 2024-04-12 NOTE — Assessment & Plan Note (Signed)
 Continue vitamin D . Goal 60-100.

## 2024-04-12 NOTE — Progress Notes (Addendum)
 Subjective:  HPI: Edwin Osborne is a 89 y.o. male presenting on 04/12/2024 for Medicare Wellness   HPI Patient is in today for AWV and chronic condition management. PMH includes HTN, HLD, DM, CKD 3b, BPH, GERD, thoracic aortic atherosclerosis. Edwin Osborne does see Nephrology twice yearly.    HTN: not monitoring at home, taking Olmesartan  40mg  daily, cardura  4mg  daily, Lasix  4mg  daily, DASH diet   HLD: continue Pravastatin  40mg  daily, heart healthy diet, limited physical activity, goal LDL <7   DM2: has increased glimepiride  4mg  to BID, Ozempic  2mg  weekly SQ, A1c today   CKD 3: pushing fluids, avoiding NSAIDs, HTN and DM well controlled   BPH well controlled on Doxazosin  4mg  daily  ROS  Relevant past medical history reviewed and updated as indicated.   Past Medical History:  Diagnosis Date   AKI (acute kidney injury) (HCC) 04/20/2020   Arthritis    BPH (benign prostatic hyperplasia)    Cataract    CKD (chronic kidney disease), stage III (HCC)    Depression    usually a happy go lucky   Diabetes mellitus    Diverticulosis    GERD (gastroesophageal reflux disease)    sometimes will take omeprazole    Hyperlipidemia    Hypertension    Sepsis secondary to UTI (HCC) 05/04/2020     Past Surgical History:  Procedure Laterality Date   CATARACT EXTRACTION, BILATERAL Bilateral 2020   Dr. Paul   CHOLECYSTECTOMY N/A 11/29/2015   Procedure: LAPAROSCOPIC CHOLECYSTECTOMY WITH INTRAOPERATIVE CHOLANGIOGRAM;  Surgeon: Debby Shipper, MD;  Location: [facility] OR;  Service: General;  Laterality: N/A;   COLONOSCOPY  2005, 08/21/11   2005 tubulovillous adenoma and adenomas 2012 - 3 adenomas, largest 10 mm, diverticulosis   FRACTURE SURGERY     ? right foot as a child   KNEE ARTHROSCOPY     TONSILLECTOMY AND ADENOIDECTOMY     VEIN LIGATION AND STRIPPING     left    Allergies and medications reviewed and updated.   Current Outpatient Medications:    aspirin  EC 81 MG tablet, Take  81 mg by mouth at bedtime. , Disp: , Rfl:    Blood Glucose Monitoring Suppl (ONE TOUCH ULTRA 2) w/Device KIT, check blood sugar 1 time daily-E11.21, Disp: 1 kit, Rfl: 0   Cholecalciferol (VITAMIN D -3) 25 MCG (1000 UT) CAPS, Take 1,000 Units by mouth at bedtime., Disp: , Rfl:    Cinnamon 500 MG TABS, Take 1,000 mg by mouth daily. , Disp: , Rfl:    doxazosin  (CARDURA ) 4 MG tablet, Take  1 tablet  at Bedtime  for BP & Prostate           /     TAKE   BY    MOUTH, Disp: 90 tablet, Rfl: 3   famotidine  (PEPCID ) 40 MG tablet, Take 1 tablet Daily for Indigestion & Heartburn, Disp: 90 tablet, Rfl: 3   furosemide  (LASIX ) 40 MG tablet, TAKE 1 TABLET BY MOUTH DAILY FOR BLOOD PRESSURE AND FLUID RETENTION OR ANKLE SWELLING, Disp: 90 tablet, Rfl: 3   glimepiride  (AMARYL ) 4 MG tablet, Take 1/2 to 1 tablet  2 x /day with Meals for Diabetes  TAKE 1/2 TO 1 TABLET BY MOUTH TWICE DAILY WITH MEALS FOR DIABETES, Disp: 180 tablet, Rfl: 3   glucose blood (ONETOUCH ULTRA) test strip, USE TO CHECK BLOOD SUGAR ONCE DAILY., Disp: 100 strip, Rfl: 3   Lancets (ONETOUCH ULTRASOFT) lancets, Check blood sugar 1 time daily-E11.21, Disp: 100 each, Rfl: 3   magnesium  oxide (MAG-OX) 400 MG tablet, Take by mouth., Disp: , Rfl:    Multiple Vitamins-Minerals (ONE-A-DAY MENS 50+ ADVANTAGE) TABS, Take 1 tablet by mouth daily with breakfast., Disp: , Rfl:    olmesartan  (BENICAR ) 40 MG tablet, Take  1 tablet  Daily for BP & Diabetic Kidney Protection             /              TAKE                                         BY                                                 MOUTH, Disp: 90 tablet, Rfl: 3   Omega-3 Fatty Acids (FISH OIL) 1200 MG CAPS, Take 1,200 mg by mouth daily., Disp: , Rfl:    omeprazole  (PRILOSEC) 20 MG capsule, Take 1 capsule (20 mg total) by mouth daily., Disp: 30 capsule, Rfl: 3   pravastatin  (PRAVACHOL ) 40 MG tablet, TAKE 1  TABLET BY MOUTH EVERY NIGHT AT BEDTIME FOR CHOLETEROL, Disp: 90 tablet, Rfl: 3   Semaglutide , 2 MG/DOSE, (OZEMPIC , 2 MG/DOSE,) 8 MG/3ML SOPN, Inject 2mg  into the skin once weekly for Diabetes, Disp: 9 mL, Rfl: 3  Allergies  Allergen Reactions   Tramadol  Other (See Comments)    Made his head feel funny    Objective:   BP 124/78   Pulse 92   Temp 98.2 F (36.8 C)   Ht 5' 9 (1.753 m)   Wt 247 lb 2 oz (112.1 kg)   SpO2 98%   BMI 36.49 kg/m      04/12/2024    2:27 PM 01/12/2024    7:57 AM 08/11/2023    8:56 AM  Vitals with BMI  Height 5' 9 5' 9 5' 9  Weight 247 lbs 2 oz 247 lbs 8 oz 247 lbs 3 oz  BMI 36.48 36.53 36.49  Systolic 124 122 869  Diastolic 78 82 62  Pulse 92 82 85     Physical Exam Vitals and nursing note reviewed.  Constitutional:      Appearance: Normal appearance. He is normal weight.  HENT:     Head: Normocephalic and atraumatic.  Cardiovascular:     Rate and Rhythm: Normal rate and regular rhythm.     Pulses: Normal pulses.     Heart sounds: Normal heart sounds.  Pulmonary:     Effort: Pulmonary effort is normal.     Breath sounds: Normal breath sounds.  Skin:    General: Skin is warm and dry.     Capillary Refill: Capillary refill takes less than 2 seconds.  Neurological:     General: No focal deficit present.     Mental Status: He is alert and oriented to person, place, and time. Mental status is at baseline.  Psychiatric:  Mood and Affect: Mood normal.        Behavior: Behavior normal.        Thought Content: Thought content normal.        Judgment: Judgment normal.     Assessment & Plan:  Encounter for Medicare annual wellness exam -     Hemoglobin A1c  BPH with obstruction/lower urinary tract symptoms  Essential hypertension Assessment & Plan: Well controlled on cardura , lasix , olmesartan . Recommend heart healthy diet such as Mediterranean diet with whole grains, fruits, vegetable, fish, lean meats, nuts, and olive oil.  Limit salt. Encouraged moderate walking, 3-5 times/week for 30-50 minutes each session. Aim for at least 150 minutes.week. Goal should be pace of 3 miles/hours, or walking 1.5 miles in 30 minutes. Avoid tobacco products. Avoid excess alcohol. Take medications as prescribed and bring medications and blood pressure log with cuff to each office visit. Seek medical care for chest pain, palpitations, shortness of breath with exertion, dizziness/lightheadedness, vision changes, recurrent headaches, or swelling of extremities. Follow up in 3 months or sooner if needed.   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 Continue glimepiride  4mg  BID and Ozempic  2mg  weekly. A1c and uACR done. Foot exam UTD. Vaccines UTD. Retinal eye exam UTD. Recommend heart healthy diet such as Mediterranean diet with whole grains, fruits, vegetable, fish, lean meats, nuts, and olive oil. Limit salt. Encouraged moderate walking, 3-5 times/week for 30-50 minutes each session. Aim for at least 150 minutes.week. Goal should be pace of 3 miles/hours, or walking 1.5 miles in 30 minutes. Seek medical care for urinary frequency, extreme thirst, vision changes, lightheadedness, dizziness.  Follow up in 3 months or sooner if needed  Orders: -     Hemoglobin A1c  Hyperlipidemia associated with type 2 diabetes mellitus (HCC) Assessment & Plan: Continue Pravastatin . Work on diet and exercise. I recommend consuming a heart healthy diet such as Mediterranean diet or DASH diet with whole grains, fruits, vegetable, fish, lean meats, nuts, and olive oil. Limit sweets and processed foods. I also encourage moderate intensity exercise 150 minutes weekly. This is 3-5 times weekly for 30-50 minutes each session. Goal should be pace of 3 miles/hours, or walking 1.5 miles in 30 minutes. The ASCVD Risk score (Arnett DK, et al., 2019) failed to calculate for the following reasons:   The 2019 ASCVD  risk score is only valid for ages 46 to 73    Stage 3 chronic kidney disease due to type 2 diabetes mellitus (HCC) Assessment & Plan: Contine olmesartan , stay hydrated, avoid NSAIDs. Followed by nephrology   Aortic Atherosclerosis (HCC) by Abd CT scan 2018 Assessment & Plan: DM, HTN, HLD well controlled   Vitamin D  deficiency Assessment & Plan: Continue vitamin D . Goal 60-100.   Type 2 diabetes mellitus with stage 3b chronic kidney disease, without long-term current use of insulin  (HCC)  Other orders -     Omeprazole ; Take 1 capsule (20 mg total) by mouth daily.  Dispense: 30 capsule; Refill: 3     Follow up plan: Return in 4 months (on 08/13/2024) for annual physical with labs 1 week prior.  Edwin GORMAN Barrio, FNP

## 2024-04-12 NOTE — Assessment & Plan Note (Signed)
 Well controlled on cardura , lasix , olmesartan . Recommend heart healthy diet such as Mediterranean diet with whole grains, fruits, vegetable, fish, lean meats, nuts, and olive oil. Limit salt. Encouraged moderate walking, 3-5 times/week for 30-50 minutes each session. Aim for at least 150 minutes.week. Goal should be pace of 3 miles/hours, or walking 1.5 miles in 30 minutes. Avoid tobacco products. Avoid excess alcohol. Take medications as prescribed and bring medications and blood pressure log with cuff to each office visit. Seek medical care for chest pain, palpitations, shortness of breath with exertion, dizziness/lightheadedness, vision changes, recurrent headaches, or swelling of extremities. Follow up in 3 months or sooner if needed.

## 2024-04-12 NOTE — Assessment & Plan Note (Signed)
 DM, HTN, HLD well controlled

## 2024-04-12 NOTE — Patient Instructions (Signed)
  Mr. Marxen , Thank you for taking time to come for your Medicare Wellness Visit. I appreciate your ongoing commitment to your health goals. Please review the following plan we discussed and let me know if I can assist you in the future.   These are the goals we discussed:  Goals      Fasting Blood Glucose <150     Need to check fasting glucose DAILY      HEMOGLOBIN A1C < 7.0     Weight (lb) < 240 lb (108.9 kg)        This is a list of the screening recommended for you and due dates:  Health Maintenance  Topic Date Due   Eye exam for diabetics  11/09/2019   Zoster (Shingles) Vaccine (1 of 2) 04/12/2024*   COVID-19 Vaccine (4 - 2024-25 season) 04/28/2024*   Flu Shot  04/22/2024   Hemoglobin A1C  07/13/2024   Complete foot exam   08/10/2024   Medicare Annual Wellness Visit  04/12/2025   DTaP/Tdap/Td vaccine (2 - Tdap) 08/30/2025   Pneumococcal Vaccine for age over 50  Completed   Hepatitis B Vaccine  Aged Out   HPV Vaccine  Aged Out   Meningitis B Vaccine  Aged Out   Colon Cancer Screening  Discontinued  *Topic was postponed. The date shown is not the original due date.

## 2024-04-12 NOTE — Assessment & Plan Note (Signed)
 Contine olmesartan , stay hydrated, avoid NSAIDs. Followed by nephrology

## 2024-04-13 ENCOUNTER — Ambulatory Visit: Payer: Self-pay | Admitting: Family Medicine

## 2024-04-13 LAB — HEMOGLOBIN A1C
Hgb A1c MFr Bld: 8.5 % — ABNORMAL HIGH (ref <5.7)
Mean Plasma Glucose: 197 mg/dL
eAG (mmol/L): 10.9 mmol/L

## 2024-04-19 NOTE — Progress Notes (Signed)
 This encounter was created in error - please disregard.

## 2024-05-03 ENCOUNTER — Telehealth: Payer: Self-pay

## 2024-05-03 NOTE — Telephone Encounter (Signed)
 Pt came into office today to ask if some corrections could be made to his patient assistance application online for pt's Ozempic . Pt stated that on pg 9 of the application there were four boxes that needed to be corrected. Pt stated that he did not have a paper copy of this application. Please advise.   Cb#: [phone]

## 2024-05-05 NOTE — Telephone Encounter (Signed)
 I have not seen this form nor know of it's completion. I have called the pt but was not able to make contact. I lvm and am awaiting contact back.

## 2024-05-06 ENCOUNTER — Telehealth: Payer: Self-pay

## 2024-05-06 NOTE — Telephone Encounter (Signed)
 Sent provider form to complete on new version of [employer] application for Ozempic - wil fax to [employer] upon return- Patient was Engineer, manufacturing systems.

## 2024-05-11 NOTE — Telephone Encounter (Signed)
 PAP: Application for Ozempic has been submitted to [employer], via fax

## 2024-05-16 NOTE — Telephone Encounter (Signed)
 PAP: Patient assistance application for Ozempic  has been approved by PAP Companies: [employer] from 05/06/2024 to 09/21/2024. Medication should be delivered to PAP Delivery: Provider's office. For further shipping updates, please contact [employer] at [phone]. Patient ID is: not provided

## 2024-05-24 ENCOUNTER — Other Ambulatory Visit: Payer: Self-pay | Admitting: Family Medicine

## 2024-05-24 DIAGNOSIS — E1122 Type 2 diabetes mellitus with diabetic chronic kidney disease: Secondary | ICD-10-CM

## 2024-05-24 NOTE — Telephone Encounter (Unsigned)
 Copied from CRM [id number]. Topic: Clinical - Medication Refill >> May 24, 2024  3:36 PM DeAngela L wrote: Medication: Semaglutide , 2 MG/DOSE, (OZEMPIC , 2 MG/DOSE,) 8 MG/3ML SOPN   patient is calling to ask if his provider could refill his prescription for Ozempic , and he is asking cause the  Previous provider has passed away and he doesn't want to go to long without it cause he believes it could be dangerous for him and he is asking if the provider could call today   Has the patient contacted their pharmacy? Yes  (Agent: If no, request that the patient contact the pharmacy for the refill. If patient does not wish to contact the pharmacy document the reason why and proceed with request.) (Agent: If yes, when and what did the pharmacy advise?)  This is the patient's preferred pharmacy:  [id number] [place], [place] - [address] AT [facility] [address] [facility] [address] [facility] [address] [place] [address] Phone: [phone] Fax: [phone]  Is this the correct pharmacy for this prescription? Yes  If no, delete pharmacy and type the correct one.   Has the prescription been filled recently? Yes   Is the patient out of the medication? Yes   Has the patient been seen for an appointment in the last year OR does the patient have an upcoming appointment? Yes  Can we respond through MyChart? No  Agent: Please be advised that Rx refills may take up to 3 business days. We ask that you follow-up with your pharmacy.

## 2024-05-25 MED ORDER — OZEMPIC (2 MG/DOSE) 8 MG/3ML ~~LOC~~ SOPN: PEN_INJECTOR | SUBCUTANEOUS | 3 refills | Status: DC

## 2024-05-25 NOTE — Telephone Encounter (Signed)
 Requested medication (s) are due for refill today: yes  Requested medication (s) are on the active medication list: yes  Last refill:  09/14/23  Future visit scheduled: yes  Notes to clinic:  Unable to refill per protocol, last refill by another provider.      Requested Prescriptions  Pending Prescriptions Disp Refills   Semaglutide , 2 MG/DOSE, (OZEMPIC , 2 MG/DOSE,) 8 MG/3ML SOPN 9 mL 3    Sig: Inject 2mg  into the skin once weekly for Diabetes     Endocrinology:  Diabetes - GLP-1 Receptor Agonists - semaglutide  Failed - 05/25/2024 12:09 PM      Failed - HBA1C in normal range and within 180 days    Hgb A1c MFr Bld  Date Value Ref Range Status  04/12/2024 8.5 (H) <5.7 % Final    Comment:    For someone without known diabetes, a hemoglobin A1c value of 6.5% or greater indicates that they may have  diabetes and this should be confirmed with a follow-up  test. . For someone with known diabetes, a value <7% indicates  that their diabetes is well controlled and a value  greater than or equal to 7% indicates suboptimal  control. A1c targets should be individualized based on  duration of diabetes, age, comorbid conditions, and  other considerations. . Currently, no consensus exists regarding use of hemoglobin A1c for diagnosis of diabetes for children. .          Failed - Cr in normal range and within 360 days    Creat  Date Value Ref Range Status  04/04/2024 1.95 (H) 0.70 - 1.22 mg/dL Final   Creatinine, Urine  Date Value Ref Range Status  08/11/2023 150 20 - 320 mg/dL Final         Passed - Valid encounter within last 6 months    Recent Outpatient Visits           4 months ago Essential hypertension   [facility] Kayla Jeoffrey RAMAN, FNP

## 2024-05-27 ENCOUNTER — Telehealth: Payer: Self-pay

## 2024-05-27 NOTE — Telephone Encounter (Signed)
 Confirmed with Patient assistance that pt is approved and receiving assistance thru he is approved until 09/21/24. They are having shipping delays- he can call the [phone] for updates.  Contacted back [employer] and spoke with Shawana C. Confirmed pt. Status and dates he will be receiving assistance. She did notate in his [employer] account.

## 2024-05-27 NOTE — Telephone Encounter (Signed)
 Copied from CRM [id number]. Topic: General - Other >> May 26, 2024  4:15 PM Debby BROCKS wrote: Reason for CRM: Jasmine from [facility] wants to know if the patient is on patient assistance for: Semaglutide , 2 MG/DOSE, (OZEMPIC , 2 MG/DOSE,) 8 MG/3ML SOPN Callback: [phone] Opt 5

## 2024-06-15 ENCOUNTER — Telehealth: Payer: Self-pay

## 2024-06-15 NOTE — Telephone Encounter (Signed)
 Copied from CRM #[id number]. Topic: Clinical - Medication Question >> Jun 15, 2024 10:07 AM Tinnie C wrote: Reason for CRM: Pt was told a shipment of Semaglutide , 2 MG/DOSE, (OZEMPIC , 2 MG/DOSE,) 8 MG/3ML SOPN would be at the office before Wednesday, calling to check on it. CAL checked fridge, nothing available for pt. Please look into this and provide pt with update at #[phone]

## 2024-06-16 ENCOUNTER — Telehealth: Payer: Self-pay

## 2024-06-16 NOTE — Telephone Encounter (Signed)
 Copied from CRM [id number]. Topic: Clinical - Medication Question >> Jun 16, 2024  1:09 PM Antony RAMAN wrote: Reason for CRM: pt calling back to see if the Ozempic  is in the office for him to pick up today, please call back and advise pt

## 2024-06-17 ENCOUNTER — Telehealth: Payer: Self-pay

## 2024-06-17 NOTE — Telephone Encounter (Signed)
 Pt advised Ozempic  has arrived in office and is ready for pick up. Pt verbalized understanding of all. Mjp,lpn

## 2024-08-06 ENCOUNTER — Other Ambulatory Visit: Payer: Self-pay | Admitting: Family Medicine

## 2024-08-11 ENCOUNTER — Encounter: Payer: Medicare Other | Admitting: Nurse Practitioner

## 2024-08-12 ENCOUNTER — Other Ambulatory Visit

## 2024-08-15 ENCOUNTER — Encounter: Admitting: Family Medicine

## 2024-08-17 DIAGNOSIS — D631 Anemia in chronic kidney disease: Secondary | ICD-10-CM | POA: Diagnosis not present

## 2024-08-17 DIAGNOSIS — N1832 Chronic kidney disease, stage 3b: Secondary | ICD-10-CM | POA: Diagnosis not present

## 2024-08-17 DIAGNOSIS — I129 Hypertensive chronic kidney disease with stage 1 through stage 4 chronic kidney disease, or unspecified chronic kidney disease: Secondary | ICD-10-CM | POA: Diagnosis not present

## 2024-08-17 DIAGNOSIS — E1122 Type 2 diabetes mellitus with diabetic chronic kidney disease: Secondary | ICD-10-CM | POA: Diagnosis not present

## 2024-09-13 ENCOUNTER — Other Ambulatory Visit

## 2024-09-13 DIAGNOSIS — E785 Hyperlipidemia, unspecified: Secondary | ICD-10-CM

## 2024-09-13 DIAGNOSIS — E559 Vitamin D deficiency, unspecified: Secondary | ICD-10-CM

## 2024-09-13 DIAGNOSIS — N183 Chronic kidney disease, stage 3 unspecified: Secondary | ICD-10-CM

## 2024-09-13 DIAGNOSIS — N138 Other obstructive and reflux uropathy: Secondary | ICD-10-CM

## 2024-09-13 DIAGNOSIS — I10 Essential (primary) hypertension: Secondary | ICD-10-CM

## 2024-09-13 DIAGNOSIS — E1122 Type 2 diabetes mellitus with diabetic chronic kidney disease: Secondary | ICD-10-CM

## 2024-09-14 LAB — LIPID PANEL
Cholesterol: 124 mg/dL
HDL: 36 mg/dL — ABNORMAL LOW
LDL Cholesterol (Calc): 63 mg/dL
Non-HDL Cholesterol (Calc): 88 mg/dL
Total CHOL/HDL Ratio: 3.4 (calc)
Triglycerides: 175 mg/dL — ABNORMAL HIGH

## 2024-09-14 LAB — COMPREHENSIVE METABOLIC PANEL WITH GFR
AG Ratio: 1.5 (calc) (ref 1.0–2.5)
ALT: 20 U/L (ref 9–46)
AST: 18 U/L (ref 10–35)
Albumin: 3.8 g/dL (ref 3.6–5.1)
Alkaline phosphatase (APISO): 87 U/L (ref 35–144)
BUN/Creatinine Ratio: 24 (calc) — ABNORMAL HIGH (ref 6–22)
BUN: 49 mg/dL — ABNORMAL HIGH (ref 7–25)
CO2: 27 mmol/L (ref 20–32)
Calcium: 8.7 mg/dL (ref 8.6–10.3)
Chloride: 105 mmol/L (ref 98–110)
Creat: 2.04 mg/dL — ABNORMAL HIGH (ref 0.70–1.22)
Globulin: 2.5 g/dL (ref 1.9–3.7)
Glucose, Bld: 196 mg/dL — ABNORMAL HIGH (ref 65–99)
Potassium: 4.4 mmol/L (ref 3.5–5.3)
Sodium: 141 mmol/L (ref 135–146)
Total Bilirubin: 0.5 mg/dL (ref 0.2–1.2)
Total Protein: 6.3 g/dL (ref 6.1–8.1)
eGFR: 31 mL/min/1.73m2 — ABNORMAL LOW

## 2024-09-14 LAB — CBC WITH DIFFERENTIAL/PLATELET
Absolute Lymphocytes: 1945 {cells}/uL (ref 850–3900)
Absolute Monocytes: 811 {cells}/uL (ref 200–950)
Basophils Absolute: 73 {cells}/uL (ref 0–200)
Basophils Relative: 0.7 %
Eosinophils Absolute: 3047 {cells}/uL — ABNORMAL HIGH (ref 15–500)
Eosinophils Relative: 29.3 %
HCT: 43 % (ref 39.4–51.1)
Hemoglobin: 13.5 g/dL (ref 13.2–17.1)
MCH: 28.2 pg (ref 27.0–33.0)
MCHC: 31.4 g/dL — ABNORMAL LOW (ref 31.6–35.4)
MCV: 90 fL (ref 81.4–101.7)
MPV: 11.1 fL (ref 7.5–12.5)
Monocytes Relative: 7.8 %
Neutro Abs: 4524 {cells}/uL (ref 1500–7800)
Neutrophils Relative %: 43.5 %
Platelets: 182 Thousand/uL (ref 140–400)
RBC: 4.78 Million/uL (ref 4.20–5.80)
RDW: 13.3 % (ref 11.0–15.0)
Total Lymphocyte: 18.7 %
WBC: 10.4 Thousand/uL (ref 3.8–10.8)

## 2024-09-14 LAB — HEMOGLOBIN A1C
Hgb A1c MFr Bld: 8.1 % — ABNORMAL HIGH
Mean Plasma Glucose: 186 mg/dL
eAG (mmol/L): 10.3 mmol/L

## 2024-09-14 LAB — MICROALBUMIN / CREATININE URINE RATIO
Creatinine, Urine: 189 mg/dL (ref 20–320)
Microalb Creat Ratio: 9 mg/g{creat}
Microalb, Ur: 1.7 mg/dL

## 2024-09-20 ENCOUNTER — Encounter: Payer: Self-pay | Admitting: Family Medicine

## 2024-09-20 ENCOUNTER — Ambulatory Visit: Admitting: Family Medicine

## 2024-09-20 VITALS — BP 138/72 | HR 99 | Ht 69.0 in | Wt 245.8 lb

## 2024-09-20 DIAGNOSIS — N1832 Chronic kidney disease, stage 3b: Secondary | ICD-10-CM | POA: Diagnosis not present

## 2024-09-20 DIAGNOSIS — E1165 Type 2 diabetes mellitus with hyperglycemia: Secondary | ICD-10-CM | POA: Diagnosis not present

## 2024-09-20 DIAGNOSIS — E1142 Type 2 diabetes mellitus with diabetic polyneuropathy: Secondary | ICD-10-CM

## 2024-09-20 DIAGNOSIS — I700 Atherosclerosis of aorta: Secondary | ICD-10-CM | POA: Diagnosis not present

## 2024-09-20 DIAGNOSIS — E559 Vitamin D deficiency, unspecified: Secondary | ICD-10-CM

## 2024-09-20 DIAGNOSIS — Z7985 Long-term (current) use of injectable non-insulin antidiabetic drugs: Secondary | ICD-10-CM | POA: Diagnosis not present

## 2024-09-20 DIAGNOSIS — Z0001 Encounter for general adult medical examination with abnormal findings: Secondary | ICD-10-CM | POA: Diagnosis not present

## 2024-09-20 DIAGNOSIS — E1169 Type 2 diabetes mellitus with other specified complication: Secondary | ICD-10-CM

## 2024-09-20 DIAGNOSIS — E1129 Type 2 diabetes mellitus with other diabetic kidney complication: Secondary | ICD-10-CM

## 2024-09-20 DIAGNOSIS — I10 Essential (primary) hypertension: Secondary | ICD-10-CM

## 2024-09-20 DIAGNOSIS — E1122 Type 2 diabetes mellitus with diabetic chronic kidney disease: Secondary | ICD-10-CM | POA: Diagnosis not present

## 2024-09-20 DIAGNOSIS — D721 Eosinophilia, unspecified: Secondary | ICD-10-CM | POA: Diagnosis not present

## 2024-09-20 DIAGNOSIS — Z0000 Encounter for general adult medical examination without abnormal findings: Secondary | ICD-10-CM | POA: Insufficient documentation

## 2024-09-20 DIAGNOSIS — Z23 Encounter for immunization: Secondary | ICD-10-CM

## 2024-09-20 MED ORDER — BLOOD GLUCOSE MONITORING SUPPL DEVI: 1.0000 | 0 refills | Status: AC

## 2024-09-20 MED ORDER — LANCETS MISC: 1.0000 | 0 refills | Status: AC

## 2024-09-20 MED ORDER — BLOOD GLUCOSE TEST VI STRP: 1.0000 | ORAL_STRIP | 0 refills | Status: AC

## 2024-09-20 MED ORDER — OZEMPIC (2 MG/DOSE) 8 MG/3ML ~~LOC~~ SOPN: PEN_INJECTOR | SUBCUTANEOUS | 3 refills | Status: AC

## 2024-09-20 MED ORDER — LANCET DEVICE MISC: 1.0000 | 0 refills | Status: AC

## 2024-09-20 NOTE — Progress Notes (Signed)
 "  Complete physical exam  Patient: Edwin Osborne    DOB: [birth year] 89 y.o.   MRN: [id number]  Chief Complaint  Patient presents with   Annual Exam    CPE  Flu vaccine today     Subjective:    GILMAN OLAZABAL is a 89 y.o. male who presents today for a complete physical exam. He reports consuming a general diet. The patient does not participate in regular exercise at present. He generally feels well. He reports sleeping well. He does not have additional problems to discuss today.   PMH includes HTN, HLD, DM, CKD 3b, BPH, GERD, thoracic aortic atherosclerosis. Mr Cerney does see Nephrology twice yearly.    HTN: not monitoring at home, taking Olmesartan  40mg  daily, cardura  4mg  daily, Lasix  40mg  daily, DASH diet   HLD: continue Pravastatin  40mg  daily, heart healthy diet, limited physical activity, goal LDL <70 Lipid Panel     Component Value Date/Time   CHOL 124 09/13/2024 0828   TRIG 175 (H) 09/13/2024 0828   HDL 36 (L) 09/13/2024 0828   CHOLHDL 3.4 09/13/2024 0828   VLDL 63 (H) 03/17/2017 1227   LDLCALC 63 09/13/2024 0828    DM2: daily fasting BG at home 180s, glimepiride  4mg  daily, Ozempic  2mg  weekly SQ Lab Results  Component Value Date   HGBA1C 8.1 (H) 09/13/2024   HGBA1C 8.5 (H) 04/12/2024   HGBA1C 9.0 (H) 01/12/2024   CKD 3: pushing fluids, avoiding NSAIDs, HTN and DM well controlled; GFR 31 Lab Results  Component Value Date   CREATININE 2.04 (H) 09/13/2024   CREATININE 1.95 (H) 04/04/2024   CREATININE 1.87 (H) 01/12/2024    BPH well controlled on Doxazosin  4mg  daily  GERD: Omeprazole  20mg  daily  Eosinophilia: worsening shortness of breath and productive cough   Discussed the use of AI scribe software for clinical note transcription with the patient, who gave verbal consent to proceed.  History of Present Illness WINTON OFFORD is an 89 year old male with diabetes who presents for a complete physical exam.  He has experienced increased  coughing and production of yellowish phlegm, describing it as 'spitting up more stuff' and 'coughing up phlegm'. He experiences shortness of breath, which is slightly worse than usual, but he remains able to perform daily activities. Occasional sneezing and a raw throat, especially in the mornings, are noted. No chest pain, except for occasional discomfort in the middle of the chest after coughing.  He has diabetes and has not checked his blood sugar levels in about a month due to running out of supplies. His last known blood sugar level was 8.1. He is currently taking glimepiride  and Ozempic  2 mg. His diet includes fruits and vegetables, but he indulges in sweets and fried foods, especially around the holidays, attributing some dietary habits to his wife's cooking.  He takes a half pill daily for allergies, believed to be cetrizine. He also takes furosemide  daily for fluid retention, which he dislikes due to frequent urination. Omeprazole  is taken for acid reflux.  He has a history of a lung nodule monitored for 15-20 years without change, leading to cessation of regular imaging. He has never smoked cigarettes but chews tobacco and smokes cigars occasionally, averaging about one cigar per week. He worked around chemicals in the past.  He experiences numbness and tingling in his feet, describing it as 'feels like you're walking on a pillow'. He has difficulty feeling sensations in his feet, consistent with diabetic neuropathy. He reports fluid retention  in his legs but denies any calf pain.  He has not seen an eye doctor in several years and has not received a flu or shingles vaccine recently. He has received multiple COVID-19 vaccines.   Most recent fall risk assessment:    04/12/2024    2:37 PM  Fall Risk   Falls in the past year? 0  Number falls in past yr: 0  Injury with Fall? 0      Data saved with a previous flowsheet row definition     Most recent depression screenings:    04/12/2024     2:34 PM 01/12/2024    8:03 AM  PHQ 2/9 Scores  PHQ - 2 Score 2 6  PHQ- 9 Score 5  9      Data saved with a previous flowsheet row definition     Patient Active Problem List   Diagnosis Date Noted   Morbid obesity (HCC) 09/20/2024   Type 2 diabetes mellitus with stage 3b chronic kidney disease, without long-term current use of insulin  (HCC) 09/20/2024   Eosinophilia 09/20/2024   Physical exam, annual 09/20/2024   Class 2 severe obesity due to excess calories with serious comorbidity and body mass index (BMI) of 38.0 to 38.9 in adult 01/17/2021   Gluttony 10/04/2020   Aortic Atherosclerosis (HCC) by Abd CT scan 2018 09/26/2019   Bilateral chronic knee pain 08/15/2019   Diabetic sensory polyneuropathy (HCC) 01/18/2016   Stage 3 chronic kidney disease due to type 2 diabetes mellitus (HCC) 03/07/2014   Medication management 03/07/2014   Hyperlipidemia associated with type 2 diabetes mellitus (HCC) 08/15/2013   Vitamin D  deficiency 08/15/2013   Poorly controlled type 2 diabetes mellitus with renal complication (HCC) 07/28/2013   Essential hypertension    History of colonic polyps 04/09/2004   Past Medical History:  Diagnosis Date   AKI (acute kidney injury) 04/20/2020   Arthritis    BPH (benign prostatic hyperplasia)    Cataract    CKD (chronic kidney disease), stage III (HCC)    Depression    usually a happy go lucky   Diabetes mellitus    Diverticulosis    GERD (gastroesophageal reflux disease)    sometimes will take omeprazole    Hyperlipidemia    Hypertension    Sepsis secondary to UTI (HCC) 05/04/2020   Past Surgical History:  Procedure Laterality Date   CATARACT EXTRACTION, BILATERAL Bilateral 2020   Dr. Paul   CHOLECYSTECTOMY N/A 11/29/2015   Procedure: LAPAROSCOPIC CHOLECYSTECTOMY WITH INTRAOPERATIVE CHOLANGIOGRAM;  Surgeon: Debby Shipper, MD;  Location: [facility] OR;  Service: General;  Laterality: N/A;   COLONOSCOPY  2005, 08/21/11   2005 tubulovillous adenoma  and adenomas 2012 - 3 adenomas, largest 10 mm, diverticulosis   FRACTURE SURGERY     ? right foot as a child   KNEE ARTHROSCOPY     TONSILLECTOMY AND ADENOIDECTOMY     VEIN LIGATION AND STRIPPING     left   Social History[1] Family History  Problem Relation Age of Onset   Prostate cancer Brother    Diabetes Brother    Colon cancer Neg Hx    Allergies[2]    Patient Care Team: Kayla Jeoffrey RAMAN, FNP as PCP - General (Family Medicine) Addie Cordella Hamilton, MD as Consulting Physician (Orthopedic Surgery) Pandora Cadet, [facility] as Pharmacist (Pharmacist)   Review of Systems  Constitutional: Negative.   HENT: Negative.    Eyes: Negative.   Respiratory:  Positive for cough, sputum production and shortness of breath.  Cardiovascular: Negative.   Gastrointestinal: Negative.   Genitourinary: Negative.   Musculoskeletal: Negative.   Skin: Negative.   Neurological: Negative.   Endo/Heme/Allergies: Negative.   Psychiatric/Behavioral: Negative.    All other systems reviewed and are negative.     Objective:    BP 138/72   Pulse 99   Ht 5' 9 (1.753 m)   Wt 245 lb 12.8 oz (111.5 kg)   SpO2 94%   BMI 36.30 kg/m  BP Readings from Last 3 Encounters:  09/20/24 138/72  04/12/24 124/78  01/12/24 122/82   Wt Readings from Last 3 Encounters:  09/20/24 245 lb 12.8 oz (111.5 kg)  04/12/24 247 lb 2 oz (112.1 kg)  01/12/24 247 lb 8 oz (112.3 kg)      Physical Exam Vitals and nursing note reviewed.  Constitutional:      Appearance: Normal appearance. He is obese.  HENT:     Head: Normocephalic and atraumatic.     Right Ear: Tympanic membrane, ear canal and external ear normal.     Left Ear: Tympanic membrane, ear canal and external ear normal.     Nose: Nose normal.     Mouth/Throat:     Mouth: Mucous membranes are moist.     Pharynx: Oropharynx is clear.  Eyes:     Extraocular Movements: Extraocular movements intact.     Right eye: Normal extraocular motion and no nystagmus.      Left eye: Normal extraocular motion and no nystagmus.     Conjunctiva/sclera: Conjunctivae normal.     Pupils: Pupils are equal, round, and reactive to light.  Cardiovascular:     Rate and Rhythm: Normal rate and regular rhythm.     Pulses: Normal pulses.     Heart sounds: Normal heart sounds.  Pulmonary:     Effort: Pulmonary effort is normal.     Breath sounds: Normal breath sounds.  Abdominal:     General: Bowel sounds are normal.     Palpations: Abdomen is soft.  Genitourinary:    Comments: Deferred using shared decision making Musculoskeletal:        General: Normal range of motion.     Cervical back: Normal range of motion and neck supple.  Skin:    General: Skin is warm and dry.     Capillary Refill: Capillary refill takes less than 2 seconds.  Neurological:     General: No focal deficit present.     Mental Status: He is alert. Mental status is at baseline.  Psychiatric:        Mood and Affect: Mood normal.        Speech: Speech normal.        Behavior: Behavior normal.        Thought Content: Thought content normal.        Cognition and Memory: Cognition and memory normal.        Judgment: Judgment normal.     Diabetic Foot Exam - Simple   Simple Foot Form Visual Inspection No deformities, no ulcerations, no other skin breakdown bilaterally: Yes Sensation Testing Intact to touch and monofilament testing bilaterally: Yes Pulse Check Posterior Tibialis and Dorsalis pulse intact bilaterally: Yes Comments       No results found for any visits on 09/20/24. Last CBC Lab Results  Component Value Date   WBC 10.4 09/13/2024   HGB 13.5 09/13/2024   HCT 43.0 09/13/2024   MCV 90.0 09/13/2024   MCH 28.2 09/13/2024   RDW 13.3 09/13/2024  PLT 182 09/13/2024   Last metabolic panel Lab Results  Component Value Date   GLUCOSE 196 (H) 09/13/2024   NA 141 09/13/2024   K 4.4 09/13/2024   CL 105 09/13/2024   CO2 27 09/13/2024   BUN 49 (H) 09/13/2024    CREATININE 2.04 (H) 09/13/2024   EGFR 31 (L) 09/13/2024   CALCIUM 8.7 09/13/2024   PROT 6.3 09/13/2024   ALBUMIN 4.0 04/09/2023   BILITOT 0.5 09/13/2024   ALKPHOS 58 04/09/2023   AST 18 09/13/2024   ALT 20 09/13/2024   ANIONGAP 8 04/09/2023   Last lipids Lab Results  Component Value Date   CHOL 124 09/13/2024   HDL 36 (L) 09/13/2024   LDLCALC 63 09/13/2024   TRIG 175 (H) 09/13/2024   CHOLHDL 3.4 09/13/2024   Last hemoglobin A1c Lab Results  Component Value Date   HGBA1C 8.1 (H) 09/13/2024   Last thyroid  functions Lab Results  Component Value Date   TSH 2.75 08/11/2023   Last vitamin D  Lab Results  Component Value Date   VD25OH 69 04/04/2024   Last vitamin B12 and Folate No results found for: VITAMINB12, FOLATE       Assessment & Plan:    Routine Health Maintenance and Physical Exam Immunization History  Administered Date(s) Administered   DT (Pediatric) 08/31/2015   INFLUENZA, HIGH DOSE SEASONAL PF 06/21/2014, 08/31/2015, 06/03/2016, 07/15/2017, 07/29/2018, 07/04/2019, 07/02/2020, 07/09/2021, 07/09/2022, 08/11/2023, 09/20/2024   Influenza Whole 05/24/2011, 08/06/2012, 07/07/2013   PFIZER(Purple Top)SARS-COV-2 Vaccination 10/15/2019, 11/06/2019, 08/30/2020   PNEUMOCOCCAL CONJUGATE-20 07/09/2021   Pfizer(Comirnaty)Fall Seasonal Vaccine 12 years and older 05/06/2021, 07/19/2022   Pneumococcal Conjugate-13 06/21/2014   Pneumococcal Polysaccharide-23 03/10/2005    Health Maintenance  Topic Date Due   OPHTHALMOLOGY EXAM  09/20/2024 (Originally 11/09/2019)   COVID-19 Vaccine (6 - 2025-26 season) 10/06/2024 (Originally 05/23/2024)   Zoster Vaccines- Shingrix (1 of 2) 12/19/2024 (Originally 04/06/1954)   HEMOGLOBIN A1C  03/14/2025   Medicare Annual Wellness (AWV)  04/12/2025   DTaP/Tdap/Td (2 - Tdap) 08/30/2025   FOOT EXAM  09/20/2025   Pneumococcal Vaccine: 50+ Years  Completed   Influenza Vaccine  Completed   Meningococcal B Vaccine  Aged Out   Colonoscopy   Discontinued    Discussed health benefits of physical activity, and encouraged him to engage in regular exercise appropriate for his age and condition.  Problem List Items Addressed This Visit       Cardiovascular and Mediastinum   Essential hypertension   Aortic Atherosclerosis (HCC) by Abd CT scan 2018     Endocrine   Poorly controlled type 2 diabetes mellitus with renal complication (HCC)   Relevant Medications   Semaglutide , 2 MG/DOSE, (OZEMPIC , 2 MG/DOSE,) 8 MG/3ML SOPN   Hyperlipidemia associated with type 2 diabetes mellitus (HCC)   Relevant Medications   Semaglutide , 2 MG/DOSE, (OZEMPIC , 2 MG/DOSE,) 8 MG/3ML SOPN   Stage 3 chronic kidney disease due to type 2 diabetes mellitus (HCC)   Relevant Medications   Semaglutide , 2 MG/DOSE, (OZEMPIC , 2 MG/DOSE,) 8 MG/3ML SOPN   Diabetic sensory polyneuropathy (HCC)   Relevant Medications   Semaglutide , 2 MG/DOSE, (OZEMPIC , 2 MG/DOSE,) 8 MG/3ML SOPN   Type 2 diabetes mellitus with stage 3b chronic kidney disease, without long-term current use of insulin  (HCC)   Relevant Medications   Semaglutide , 2 MG/DOSE, (OZEMPIC , 2 MG/DOSE,) 8 MG/3ML SOPN     Other   Vitamin D  deficiency   Morbid obesity (HCC)   Relevant Medications   Semaglutide , 2 MG/DOSE, (OZEMPIC , 2  MG/DOSE,) 8 MG/3ML SOPN   Eosinophilia   Relevant Orders   DG Chest 2 View   CBC with Differential/Platelet   Physical exam, annual - Primary   Other Visit Diagnoses       Need for vaccination       Relevant Orders   Flu vaccine HIGH DOSE PF(Fluzone Trivalent) (Completed)       Assessment and Plan Assessment & Plan Chronic cough with eosinophilia Chronic cough with increased phlegm. - Ordered chest x-ray at [facility]. - Continue current allergy medication regimen. - Repeat CBC.  Type 2 diabetes mellitus with diabetic polyneuropathy Diabetes management suboptimal with HbA1c of 8.1. Reports not checking blood glucose due to lack of supplies.  Experiencing diabetic polyneuropathy symptoms. - Prescribed new blood glucose monitoring supplies. - Continue glimepiride  and Ozempic . - Encouraged regular blood glucose monitoring.  Chronic kidney disease, stage III Kidney function well-managed with regular nephrologist follow-up.  Obesity Reports good appetite and occasional indulgence in sweets and fried foods. - Encouraged healthy eating habits and regular exercise. - Continue Ozempic .  Gastroesophageal reflux disease Well-controlled with current medication regimen. - Continue current medication regimen.  Osteoarthritis Reports knee pain and difficulty bending knees.  General health maintenance Has not seen an eye doctor in several years. Declines shingles vaccine due to insurance concerns. - Recommended eye examination. - Discussed shingles vaccine and insurance coverage. - Discussed COVID vaccine benefits and personal choice.    Return in about 3 months (around 12/19/2024) for chronic follow-up with labs 1 week prior.    Jeoffrey GORMAN Barrio, FNP [facility]       [1]  Social History Tobacco Use   Smoking status: Former    Types: Cigars   Smokeless tobacco: Never   Tobacco comments:    once weekly cigars, remotely chewed tobacco  Vaping Use   Vaping status: Never Used  Substance Use Topics   Alcohol use: Yes    Comment: once monthly   Drug use: No  [2]  Allergies Allergen Reactions   Tramadol  Other (See Comments)    Made his head feel funny   "

## 2024-09-21 ENCOUNTER — Ambulatory Visit
Admission: RE | Admit: 2024-09-21 | Discharge: 2024-09-21 | Disposition: A | Discharge status: Home / Self Care | Source: Ambulatory Visit | Attending: Family Medicine | Admitting: Family Medicine

## 2024-09-21 ENCOUNTER — Telehealth: Payer: Self-pay | Admitting: Pharmacy Technician

## 2024-09-21 ENCOUNTER — Other Ambulatory Visit (HOSPITAL_COMMUNITY): Payer: Self-pay

## 2024-09-21 DIAGNOSIS — D721 Eosinophilia, unspecified: Secondary | ICD-10-CM

## 2024-09-21 LAB — CBC WITH DIFFERENTIAL/PLATELET
Absolute Lymphocytes: 1620 {cells}/uL (ref 850–3900)
Absolute Monocytes: 846 {cells}/uL (ref 200–950)
Basophils Absolute: 36 {cells}/uL (ref 0–200)
Basophils Relative: 0.4 %
Eosinophils Absolute: 828 {cells}/uL — ABNORMAL HIGH (ref 15–500)
Eosinophils Relative: 9.1 %
HCT: 42.4 % (ref 39.4–51.1)
Hemoglobin: 13.3 g/dL (ref 13.2–17.1)
MCH: 28.2 pg (ref 27.0–33.0)
MCHC: 31.4 g/dL — ABNORMAL LOW (ref 31.6–35.4)
MCV: 89.8 fL (ref 81.4–101.7)
MPV: 11 fL (ref 7.5–12.5)
Monocytes Relative: 9.3 %
Neutro Abs: 5769 {cells}/uL (ref 1500–7800)
Neutrophils Relative %: 63.4 %
Platelets: 184 Thousand/uL (ref 140–400)
RBC: 4.72 Million/uL (ref 4.20–5.80)
RDW: 13.2 % (ref 11.0–15.0)
Total Lymphocyte: 17.8 %
WBC: 9.1 Thousand/uL (ref 3.8–10.8)

## 2024-09-21 NOTE — Telephone Encounter (Signed)
 Pharmacy Patient Advocate Encounter   Received notification from CoverMyMeds that prior authorization for Ozempic  (2 MG/DOSE) 8MG /3ML pen-injectors is required/requested.   Insurance verification completed.   The patient is insured through [facility].   Per test claim: PA required; PA started via CoverMyMeds. KEY [id number] . Waiting for clinical questions to populate.

## 2024-09-23 ENCOUNTER — Other Ambulatory Visit (HOSPITAL_COMMUNITY): Payer: Self-pay

## 2024-09-23 NOTE — Telephone Encounter (Signed)
 Pharmacy Patient Advocate Encounter  Received notification from [facility] [employer]D that Prior Authorization for Ozempic  (2 MG/DOSE) 8MG /3ML pen-injectors has been APPROVED from 09/21/24 to 09/21/25. Ran test claim, Copay is $775.97. This test claim was processed through [facility]- copay amounts may vary at other pharmacies due to pharmacy/plan contracts, or as the patient moves through the different stages of their insurance plan.   PA #/Case ID/Reference #: [id number]

## 2024-09-25 ENCOUNTER — Ambulatory Visit: Payer: Self-pay | Admitting: Family Medicine

## 2024-10-25 ENCOUNTER — Telehealth: Payer: Self-pay

## 2024-10-25 NOTE — Telephone Encounter (Signed)
 Copied from CRM [id number]. Topic: General - Other >> Oct 25, 2024  1:13 PM Hadassah PARAS wrote: Reason for CRM: Pt returning missed call from Corinna Jesusa SAUNDERS, CMA. Please call pt back on #[phone]

## 2024-10-26 NOTE — Telephone Encounter (Signed)
 Returned pt. Call and alerted provider to need for inhaler per her initial request.

## 2024-10-27 ENCOUNTER — Other Ambulatory Visit: Payer: Self-pay | Admitting: Family Medicine

## 2024-10-27 MED ORDER — ALBUTEROL SULFATE HFA 108 (90 BASE) MCG/ACT IN AERS: 2.0000 | INHALATION_SPRAY | Freq: Four times a day (QID) | RESPIRATORY_TRACT | 0 refills | Status: AC | PRN

## 2024-11-14 ENCOUNTER — Ambulatory Visit: Payer: Medicare Other | Admitting: Family Medicine

## 2024-12-21 ENCOUNTER — Other Ambulatory Visit

## 2024-12-26 ENCOUNTER — Ambulatory Visit: Admitting: Family Medicine
